# Patient Record
Sex: Female | Born: 1975
Health system: Southern US, Community
[De-identification: ages and names within clinical notes are randomized; demographics above are authoritative.]

## PROBLEM LIST (undated history)

## (undated) DIAGNOSIS — E7849 Other hyperlipidemia: Secondary | ICD-10-CM

## (undated) DIAGNOSIS — T7840XA Allergy, unspecified, initial encounter: Secondary | ICD-10-CM

## (undated) DIAGNOSIS — T783XXA Angioneurotic edema, initial encounter: Secondary | ICD-10-CM

## (undated) DIAGNOSIS — K449 Diaphragmatic hernia without obstruction or gangrene: Secondary | ICD-10-CM

## (undated) DIAGNOSIS — E876 Hypokalemia: Secondary | ICD-10-CM

## (undated) DIAGNOSIS — K219 Gastro-esophageal reflux disease without esophagitis: Secondary | ICD-10-CM

## (undated) DIAGNOSIS — F329 Major depressive disorder, single episode, unspecified: Secondary | ICD-10-CM

## (undated) DIAGNOSIS — F419 Anxiety disorder, unspecified: Secondary | ICD-10-CM

## (undated) DIAGNOSIS — I10 Essential (primary) hypertension: Secondary | ICD-10-CM

## (undated) DIAGNOSIS — E723 Disorders of lysine and hydroxylysine metabolism: Secondary | ICD-10-CM

## (undated) DIAGNOSIS — F32A Depression, unspecified: Secondary | ICD-10-CM

## (undated) DIAGNOSIS — D573 Sickle-cell trait: Secondary | ICD-10-CM

## (undated) HISTORY — DX: Allergy, unspecified, initial encounter: T78.40XA

## (undated) HISTORY — DX: Anxiety disorder, unspecified: F41.9

## (undated) HISTORY — PX: FOOT SURGERY: SHX648

## (undated) HISTORY — DX: Diaphragmatic hernia without obstruction or gangrene: K44.9

## (undated) HISTORY — DX: Essential (primary) hypertension: I10

## (undated) HISTORY — DX: Depression, unspecified: F32.A

## (undated) HISTORY — PX: UPPER GASTROINTESTINAL ENDOSCOPY: SHX188

## (undated) HISTORY — DX: Gastro-esophageal reflux disease without esophagitis: K21.9

## (undated) HISTORY — DX: Disorders of lysine and hydroxylysine metabolism: E72.3

## (undated) HISTORY — PX: TUBAL LIGATION: SHX77

## (undated) HISTORY — DX: Angioneurotic edema, initial encounter: T78.3XXA

## (undated) HISTORY — DX: Major depressive disorder, single episode, unspecified: F32.9

## (undated) HISTORY — DX: Other hyperlipidemia: E78.49

## (undated) HISTORY — DX: Sickle-cell trait: D57.3

---

## 1998-02-17 ENCOUNTER — Ambulatory Visit (HOSPITAL_COMMUNITY): Admission: RE | Admit: 1998-02-17 | Discharge: 1998-02-17 | Payer: Self-pay | Admitting: *Deleted

## 1998-02-27 ENCOUNTER — Inpatient Hospital Stay (HOSPITAL_COMMUNITY): Admission: AD | Admit: 1998-02-27 | Discharge: 1998-02-27 | Payer: Self-pay | Admitting: *Deleted

## 1998-04-05 ENCOUNTER — Inpatient Hospital Stay (HOSPITAL_COMMUNITY): Admission: AD | Admit: 1998-04-05 | Discharge: 1998-04-05 | Payer: Self-pay | Admitting: Obstetrics & Gynecology

## 1998-04-15 ENCOUNTER — Emergency Department (HOSPITAL_COMMUNITY): Admission: EM | Admit: 1998-04-15 | Discharge: 1998-04-15 | Payer: Self-pay | Admitting: Emergency Medicine

## 1998-04-15 ENCOUNTER — Observation Stay (HOSPITAL_COMMUNITY): Admission: AD | Admit: 1998-04-15 | Discharge: 1998-04-16 | Payer: Self-pay | Admitting: Obstetrics & Gynecology

## 1998-04-21 ENCOUNTER — Inpatient Hospital Stay (HOSPITAL_COMMUNITY): Admission: AD | Admit: 1998-04-21 | Discharge: 1998-04-21 | Payer: Self-pay | Admitting: Obstetrics & Gynecology

## 1998-04-29 ENCOUNTER — Ambulatory Visit (HOSPITAL_COMMUNITY): Admission: RE | Admit: 1998-04-29 | Discharge: 1998-04-29 | Payer: Self-pay | Admitting: Obstetrics

## 1998-06-15 ENCOUNTER — Inpatient Hospital Stay (HOSPITAL_COMMUNITY): Admission: AD | Admit: 1998-06-15 | Discharge: 1998-06-15 | Payer: Self-pay | Admitting: Obstetrics

## 1998-07-19 ENCOUNTER — Encounter (HOSPITAL_COMMUNITY): Admission: RE | Admit: 1998-07-19 | Discharge: 1998-07-21 | Payer: Self-pay | Admitting: *Deleted

## 1998-07-20 ENCOUNTER — Inpatient Hospital Stay (HOSPITAL_COMMUNITY): Admission: AD | Admit: 1998-07-20 | Discharge: 1998-07-22 | Payer: Self-pay | Admitting: Obstetrics

## 1999-08-30 ENCOUNTER — Emergency Department (HOSPITAL_COMMUNITY): Admission: EM | Admit: 1999-08-30 | Discharge: 1999-08-30 | Payer: Self-pay | Admitting: Emergency Medicine

## 1999-11-17 ENCOUNTER — Encounter (INDEPENDENT_AMBULATORY_CARE_PROVIDER_SITE_OTHER): Payer: Self-pay

## 1999-11-17 ENCOUNTER — Other Ambulatory Visit: Admission: RE | Admit: 1999-11-17 | Discharge: 1999-11-17 | Payer: Self-pay | Admitting: Obstetrics

## 2000-01-20 ENCOUNTER — Emergency Department (HOSPITAL_COMMUNITY): Admission: EM | Admit: 2000-01-20 | Discharge: 2000-01-20 | Payer: Self-pay

## 2000-10-07 ENCOUNTER — Emergency Department (HOSPITAL_COMMUNITY): Admission: EM | Admit: 2000-10-07 | Discharge: 2000-10-07 | Payer: Self-pay | Admitting: Emergency Medicine

## 2000-10-07 ENCOUNTER — Encounter: Payer: Self-pay | Admitting: Emergency Medicine

## 2001-06-07 ENCOUNTER — Emergency Department (HOSPITAL_COMMUNITY): Admission: EM | Admit: 2001-06-07 | Discharge: 2001-06-07 | Payer: Self-pay | Admitting: *Deleted

## 2002-06-16 ENCOUNTER — Emergency Department (HOSPITAL_COMMUNITY): Admission: EM | Admit: 2002-06-16 | Discharge: 2002-06-16 | Payer: Self-pay | Admitting: Emergency Medicine

## 2002-06-18 ENCOUNTER — Ambulatory Visit (HOSPITAL_BASED_OUTPATIENT_CLINIC_OR_DEPARTMENT_OTHER): Admission: RE | Admit: 2002-06-18 | Discharge: 2002-06-18 | Payer: Self-pay | Admitting: Orthopaedic Surgery

## 2002-08-26 ENCOUNTER — Emergency Department (HOSPITAL_COMMUNITY): Admission: EM | Admit: 2002-08-26 | Discharge: 2002-08-26 | Payer: Self-pay | Admitting: Emergency Medicine

## 2002-08-26 ENCOUNTER — Encounter: Payer: Self-pay | Admitting: Emergency Medicine

## 2004-11-16 ENCOUNTER — Ambulatory Visit: Payer: Self-pay | Admitting: Family Medicine

## 2004-12-18 ENCOUNTER — Ambulatory Visit: Payer: Self-pay | Admitting: Family Medicine

## 2004-12-18 ENCOUNTER — Ambulatory Visit (HOSPITAL_COMMUNITY): Admission: RE | Admit: 2004-12-18 | Discharge: 2004-12-18 | Payer: Self-pay | Admitting: Family Medicine

## 2005-01-18 ENCOUNTER — Ambulatory Visit: Payer: Self-pay | Admitting: Family Medicine

## 2005-08-22 ENCOUNTER — Emergency Department (HOSPITAL_COMMUNITY): Admission: EM | Admit: 2005-08-22 | Discharge: 2005-08-23 | Payer: Self-pay | Admitting: Emergency Medicine

## 2005-11-30 ENCOUNTER — Inpatient Hospital Stay (HOSPITAL_COMMUNITY): Admission: AD | Admit: 2005-11-30 | Discharge: 2005-11-30 | Payer: Self-pay | Admitting: *Deleted

## 2006-08-13 ENCOUNTER — Emergency Department (HOSPITAL_COMMUNITY): Admission: EM | Admit: 2006-08-13 | Discharge: 2006-08-13 | Payer: Self-pay | Admitting: Emergency Medicine

## 2006-09-01 ENCOUNTER — Inpatient Hospital Stay (HOSPITAL_COMMUNITY): Admission: AD | Admit: 2006-09-01 | Discharge: 2006-09-01 | Payer: Self-pay | Admitting: Gynecology

## 2008-01-28 ENCOUNTER — Emergency Department (HOSPITAL_COMMUNITY): Admission: EM | Admit: 2008-01-28 | Discharge: 2008-01-28 | Payer: Self-pay | Admitting: Emergency Medicine

## 2008-05-31 ENCOUNTER — Emergency Department (HOSPITAL_COMMUNITY): Admission: EM | Admit: 2008-05-31 | Discharge: 2008-05-31 | Payer: Self-pay | Admitting: Emergency Medicine

## 2008-08-30 ENCOUNTER — Emergency Department (HOSPITAL_COMMUNITY): Admission: EM | Admit: 2008-08-30 | Discharge: 2008-08-30 | Payer: Self-pay | Admitting: Emergency Medicine

## 2008-11-03 ENCOUNTER — Emergency Department (HOSPITAL_COMMUNITY): Admission: EM | Admit: 2008-11-03 | Discharge: 2008-11-03 | Payer: Self-pay | Admitting: Emergency Medicine

## 2009-02-08 ENCOUNTER — Emergency Department (HOSPITAL_COMMUNITY): Admission: EM | Admit: 2009-02-08 | Discharge: 2009-02-08 | Payer: Self-pay | Admitting: Family Medicine

## 2009-05-19 ENCOUNTER — Inpatient Hospital Stay (HOSPITAL_COMMUNITY): Admission: AD | Admit: 2009-05-19 | Discharge: 2009-05-19 | Payer: Self-pay | Admitting: Obstetrics & Gynecology

## 2009-05-19 ENCOUNTER — Ambulatory Visit: Payer: Self-pay | Admitting: Physician Assistant

## 2009-05-23 ENCOUNTER — Encounter: Admission: RE | Admit: 2009-05-23 | Discharge: 2009-05-23 | Payer: Self-pay | Admitting: Obstetrics & Gynecology

## 2009-06-07 ENCOUNTER — Emergency Department (HOSPITAL_COMMUNITY): Admission: EM | Admit: 2009-06-07 | Discharge: 2009-06-07 | Payer: Self-pay | Admitting: Emergency Medicine

## 2010-04-04 ENCOUNTER — Emergency Department (HOSPITAL_COMMUNITY): Admission: EM | Admit: 2010-04-04 | Discharge: 2010-04-04 | Payer: Self-pay | Admitting: Family Medicine

## 2010-05-31 ENCOUNTER — Emergency Department (HOSPITAL_COMMUNITY): Admission: EM | Admit: 2010-05-31 | Discharge: 2010-05-31 | Payer: Self-pay | Admitting: Family Medicine

## 2010-06-27 ENCOUNTER — Ambulatory Visit: Payer: Self-pay | Admitting: Obstetrics and Gynecology

## 2010-06-27 ENCOUNTER — Inpatient Hospital Stay (HOSPITAL_COMMUNITY): Admission: AD | Admit: 2010-06-27 | Discharge: 2010-06-27 | Payer: Self-pay | Admitting: Obstetrics and Gynecology

## 2011-01-17 LAB — HM MAMMOGRAPHY

## 2011-03-04 LAB — URINALYSIS, ROUTINE W REFLEX MICROSCOPIC
Hgb urine dipstick: NEGATIVE
Specific Gravity, Urine: 1.01 (ref 1.005–1.030)
Urobilinogen, UA: 0.2 mg/dL (ref 0.0–1.0)

## 2011-03-04 LAB — POCT URINALYSIS DIP (DEVICE)
Bilirubin Urine: NEGATIVE
Hgb urine dipstick: NEGATIVE
Nitrite: NEGATIVE
Urobilinogen, UA: 0.2 mg/dL (ref 0.0–1.0)
pH: 8 (ref 5.0–8.0)

## 2011-03-04 LAB — POCT PREGNANCY, URINE: Preg Test, Ur: NEGATIVE

## 2011-03-04 LAB — CBC
MCH: 28.3 pg (ref 26.0–34.0)
MCHC: 33.8 g/dL (ref 30.0–36.0)
Platelets: 267 10*3/uL (ref 150–400)
WBC: 6.8 10*3/uL (ref 4.0–10.5)

## 2011-03-04 LAB — WET PREP, GENITAL
Trich, Wet Prep: NONE SEEN
Yeast Wet Prep HPF POC: NONE SEEN

## 2011-03-04 LAB — GC/CHLAMYDIA PROBE AMP, GENITAL: GC Probe Amp, Genital: NEGATIVE

## 2011-03-06 LAB — POCT URINALYSIS DIP (DEVICE)
Bilirubin Urine: NEGATIVE
Glucose, UA: NEGATIVE mg/dL
Ketones, ur: NEGATIVE mg/dL
Nitrite: NEGATIVE

## 2011-03-26 LAB — BASIC METABOLIC PANEL
CO2: 26 mEq/L (ref 19–32)
Calcium: 9 mg/dL (ref 8.4–10.5)
Chloride: 105 mEq/L (ref 96–112)
Creatinine, Ser: 0.89 mg/dL (ref 0.4–1.2)
GFR calc Af Amer: >60 mL/min (ref >60)
Glucose, Bld: 89 mg/dL (ref 70–99)
Potassium: 3.4 mEq/L — ABNORMAL LOW (ref 3.5–5.1)
Sodium: 139 mEq/L (ref 135–145)

## 2011-03-26 LAB — DIFFERENTIAL
Basophils Absolute: 0 10*3/uL (ref 0.0–0.1)
Basophils Relative: 1 % (ref 0–1)
Eosinophils Absolute: 0.2 10*3/uL (ref 0.0–0.7)
Lymphs Abs: 2.8 10*3/uL (ref 0.7–4.0)
Monocytes Absolute: 0.6 10*3/uL (ref 0.1–1.0)
Monocytes Relative: 9 % (ref 3–12)

## 2011-03-26 LAB — URINALYSIS, ROUTINE W REFLEX MICROSCOPIC
Bilirubin Urine: NEGATIVE
Hgb urine dipstick: NEGATIVE
Nitrite: NEGATIVE
Protein, ur: NEGATIVE mg/dL
pH: 5.5 (ref 5.0–8.0)

## 2011-03-26 LAB — CK TOTAL AND CKMB (NOT AT ARMC)
CK, MB: 1.6 ng/mL (ref 0.3–4.0)
Relative Index: 0.7 (ref 0.0–2.5)
Total CK: 226 U/L — ABNORMAL HIGH (ref 7–177)

## 2011-03-26 LAB — CBC
Hemoglobin: 12.5 g/dL (ref 12.0–15.0)
MCV: 83 fL (ref 78.0–100.0)
RBC: 4.36 MIL/uL (ref 3.87–5.11)
RDW: 15.4 % (ref 11.5–15.5)

## 2011-04-05 ENCOUNTER — Inpatient Hospital Stay (INDEPENDENT_AMBULATORY_CARE_PROVIDER_SITE_OTHER)
Admission: RE | Admit: 2011-04-05 | Discharge: 2011-04-05 | Disposition: A | Discharge status: Home / Self Care | Payer: BLUE CROSS/BLUE SHIELD | Source: Ambulatory Visit | Attending: Family Medicine | Admitting: Family Medicine

## 2011-04-05 ENCOUNTER — Encounter: Payer: Self-pay | Admitting: Family Medicine

## 2011-04-05 DIAGNOSIS — R209 Unspecified disturbances of skin sensation: Secondary | ICD-10-CM

## 2011-04-05 DIAGNOSIS — M7520 Bicipital tendinitis, unspecified shoulder: Secondary | ICD-10-CM

## 2011-04-06 ENCOUNTER — Encounter: Payer: Self-pay | Admitting: Family Medicine

## 2011-04-07 ENCOUNTER — Telehealth (INDEPENDENT_AMBULATORY_CARE_PROVIDER_SITE_OTHER): Payer: Self-pay

## 2011-05-04 NOTE — Op Note (Signed)
[place]  Patient:    Mary Robles, Mary Robles Visit Number: [id number] MRN: [id number]          Service Type: DSU Location: [facility] Attending Physician:  Randolm Idol Dictated by:   Claude Manges. Cleophas Dunker, M.D. Proc. Date: 06/18/02 Admit Date:  06/18/2002 Discharge Date: 06/18/2002                             Operative Report  PREOPERATIVE DIAGNOSIS:  Laceration of extensor hallucis longus tendon, right foot.  POSTOPERATIVE DIAGNOSIS:  Laceration of extensor hallucis longus tendon, right foot.  OPERATION:  Primary repair.  SURGEON:  Claude Manges. Cleophas Dunker, M.D.  ANESTHESIA:  Ankle block and IV sedation.  COMPLICATIONS:  None.  INDICATIONS:  [age]-year-old black female dropped a knife on the dorsum of her right foot three nights ago with about 1/2 inch laceration.  She lacerated the extensor hallucis longus tendon.  The wound was irrigated and closed in the emergency room, and she was placed on Keflex and asked to follow up in the office.  She was seen yesterday with confirmation of inability to dorsiflex the great toe and now is to have primary repair of the tendon.  DESCRIPTION OF PROCEDURE:  With the patient comfortable on the operating table and under excellent ankle block and IV sedation, the right lower extremity was prepped with Hibiclens and alcohol.  The tourniquet had been applied about the calf.  Sterile draping was performed.  With the foot elevated, it was Esmarch exsanguinated with the proximal tourniquet at 325 mmHg.  The laceration was located in the mid dorsum of the foot transversely about 1/2 inch in length. The laceration was located in the mid dorsum of the foot transversely about 1/2 inch in length.  The old stitches were removed.  Those instruments were removed from the operating table.  Again, I cleaned the wound with Hibiclens. The wound was then elongated proximally and laterally and then distally and medially.  The  flaps were then undermined with the scissors.  Two small veins were Bovie coagulated.  The flaps were then undermined with the scissors.  Two small veins were Bovie coagulated.  Larger veins were left intact.  By blunt dissection I was able to retrieve the retracted end proximally.  Using a 3-0 Ethibond, Bunnell suture was inserted.  The distal end had also retracted.  When dorsiflexing the foot and toes, I was able to retrieve the tendon and using the same suture with 3-0 Ethibond the Bunnell suture was sutured to it as well.  The ends of the tendon were then approximated.  The sutures were tied and I then used supplemental 4-0 Vicryl circumferentially about the tendon.  I had an excellent repair.  The wound was irrigated.  The sheath was repaired with 4-0 Vicryl.  Skin was closed with 4-0 subcuticular Vicryl and then Steri-Strips over Benzoin.  The tourniquet was deflated with immediate capillary refill to the toes.  Sterile bulky dressing was applied followed by posterior and U-splints with the foot at neutral.  The patient tolerated the procedure without complications.  PLAN:  Nonweightbearing crutches.  Percocet for pain.  Office in one week. Dictated by:   Claude Manges. Cleophas Dunker, M.D. Attending Physician:  Randolm Idol DD:  06/18/02 TD:  06/22/02 Job: [id number]

## 2011-05-04 NOTE — Op Note (Signed)
NAME:  Mary Robles, Mary Robles                 ACCOUNT NO.:  [id number]   MEDICAL RECORD NO.:  [id number]          PATIENT TYPE:  AMB   LOCATION:  SDC                           FACILITY:  [facility]   PHYSICIAN:  Tanya S. Shawnie Pons, M.D.   DATE OF BIRTH:  [birth year]   DATE OF PROCEDURE:  12/18/2004  DATE OF DISCHARGE:                                 OPERATIVE REPORT   PREOPERATIVE DIAGNOSIS:  Multiparity, undesired fertility.   POSTOPERATIVE DIAGNOSIS:  Multiparity, undesired fertility.   PROCEDURE:  Laparoscopic bilateral tubal ligation with Filshie clip.   SURGEON:  Shelbie Proctor. Shawnie Pons, M.D.   ASSISTANT:  Lonell Grandchild.   ANESTHESIA:  General.   SPECIMENS:  None.   ESTIMATED BLOOD LOSS:  Less than 25 cc.   COMPLICATIONS:  None.   FINDINGS:  Normal appearance of tubes and ovaries.  She did have some pelvic  congestion noted as well as hyperemia noted.   REASON FOR PROCEDURE:  The patient is a 35 year old G4, P2-0-2-2 who desires  permanent sterilization.  She was counseled regarding the risks and benefits  of the procedure including failure rate which is 1 in 200 and increased risk  of ectopic should pregnancy occur.  The patient understood these risks and  agreed to proceed.   PROCEDURE:  The patient was taken to the OR.  She was placed in the dorsal  lithotomy in [place] stirrups.  She was prepped and draped in the usual  sterile fashion.  Her bladder was drained with a red rubber catheter.  The  speculum was used to visualize the cervix.  The fascia was grasped  anteriorly with a single-toothed tenaculum and a Hulka tenaculum placed  without difficulty.  The single-toothed tenaculum was then removed from the  vagina.  Attention was then turned to the abdomen.  A 2 cc of 0.25% Marcaine  were infiltrated in the umbilicus.  Two Allis clamps were used to elevate it  and the incision made until the peritoneal cavity entered sharply.  The two  edges of the fascia were tagged with a 0Vicryl suture on  the UR6.  The  Hasson trocar was introduced into the abdomen. Gas was turned on.  The  camera put in and the area below the incision was inspected and found to be  within normal limits.  Attention was then turned to the pelvis as the  patient was placed in Trendelenburg.  The tubes were visualized, grasped and  followed to their fimbriated end.  A Filshie clip was placed across the left  and right tube without difficulty.  Pictures were taken of the tubes in situ  with the clips in place.  All instruments were then removed from the abdomen  and gas allowed to escape through the incision.  The fascia at the umbilicus  was then closed in two figure-of-eights of the previous sutures in the UR6  until the fascia was felt closed.  The skin was closed with a 4-0  Vicryl subcutaneous stitch without difficulty.  Four more cc's of 0.25%  Marcaine were then infiltrated to the incision. All  instrument, needle and  lap counts were correct x2.  The Hulka tenaculum was taken out of the  vagina.  The patient was awakened and taken to the recovery room in stable  condition.      TSP/MEDQ  D:  12/18/2004  T:  12/18/2004  Job:  [id number]

## 2011-07-12 ENCOUNTER — Encounter: Payer: Self-pay | Admitting: Family Medicine

## 2011-07-12 DIAGNOSIS — I10 Essential (primary) hypertension: Secondary | ICD-10-CM | POA: Insufficient documentation

## 2011-07-12 DIAGNOSIS — E559 Vitamin D deficiency, unspecified: Secondary | ICD-10-CM | POA: Insufficient documentation

## 2011-09-07 LAB — URINALYSIS, ROUTINE W REFLEX MICROSCOPIC
Glucose, UA: NEGATIVE
Ketones, ur: 15 — AB
Nitrite: NEGATIVE
Protein, ur: NEGATIVE

## 2011-09-07 LAB — DIFFERENTIAL
Basophils Absolute: 0.1
Basophils Relative: 1
Eosinophils Absolute: 0.2
Eosinophils Relative: 2
Lymphocytes Relative: 37
Lymphs Abs: 3
Monocytes Absolute: 0.8
Monocytes Relative: 10
Neutro Abs: 4.1
Neutrophils Relative %: 51

## 2011-09-07 LAB — CBC
MCHC: 34
MCV: 83.7
Platelets: 250
WBC: 8.1

## 2011-09-07 LAB — I-STAT 8, (EC8 V) (CONVERTED LAB)
Acid-base deficit: 2
BUN: 4 — ABNORMAL LOW
Bicarbonate: 23.6
Chloride: 106
Glucose, Bld: 71
HCT: 44
Hemoglobin: 15
Operator id: 234501
Potassium: 3.8
Sodium: 138
TCO2: 25
pCO2, Ven: 44.1 — ABNORMAL LOW
pH, Ven: 7.338 — ABNORMAL HIGH

## 2011-09-07 LAB — POCT I-STAT CREATININE
Creatinine, Ser: 0.9
Operator id: 234501

## 2011-09-07 LAB — POCT CARDIAC MARKERS
Myoglobin, poc: 36.2
Operator id: 234501
Troponin i, poc: <0.05

## 2011-09-19 LAB — DIFFERENTIAL
Eosinophils Absolute: 0.1
Lymphocytes Relative: 31
Lymphs Abs: 2.9
Monocytes Relative: 12
Neutro Abs: 5.1
Neutrophils Relative %: 55

## 2011-09-19 LAB — POCT I-STAT, CHEM 8
BUN: 7
Chloride: 101
Creatinine, Ser: 1.1
Potassium: 3.6
Sodium: 139
TCO2: 27

## 2011-09-19 LAB — URINALYSIS, ROUTINE W REFLEX MICROSCOPIC
Bilirubin Urine: NEGATIVE
Nitrite: NEGATIVE
Specific Gravity, Urine: 1.016
Urobilinogen, UA: 0.2
pH: 8

## 2011-09-19 LAB — CBC
MCV: 86.3
Platelets: 276
RBC: 4.56
WBC: 9.4

## 2011-09-19 LAB — POCT CARDIAC MARKERS
CKMB, poc: <1 — ABNORMAL LOW
Myoglobin, poc: 38.1
Myoglobin, poc: 42.8
Troponin i, poc: <0.05

## 2011-09-19 LAB — D-DIMER, QUANTITATIVE: D-Dimer, Quant: <0.22

## 2011-09-19 LAB — POCT PREGNANCY, URINE: Preg Test, Ur: NEGATIVE

## 2011-11-19 NOTE — Progress Notes (Signed)
Summary: LEF ARM PAIN Room 5   Vital Signs:  Patient Profile:   35 Years Old Female CC:      Left arm pain, tingling after light exercise this morning Height:     66 inches Weight:      182 pounds O2 Sat:      100 % O2 treatment:    Room Air Temp:     98.6 degrees F oral Pulse rate:   73 / minute Pulse rhythm:   regular Resp:     12 per minute BP sitting:   152 / 93  (right arm) Cuff size:   regular  Vitals Entered By: Emilio Math (April 05, 2011 1:00 PM)                  Current Allergies: No known allergies History of Present Illness Chief Complaint: Left arm pain, tingling after light exercise this morning History of Present Illness:  Subjective:  Patient was exercising in place at work this morning for about 10 minutes.  Exercise consisted of marching in place, with exaggerated movements of the arms.  Afterwards, she developed a sensation of numbness and cold in her left arm that has persisted.  She feels that the strength in her left arm is decreased.  No chest pain or shortness of breath. Patient states that she does not normally perform this type of exercise.  REVIEW OF SYSTEMS Constitutional Symptoms      Denies fever, chills, night sweats, weight loss, weight gain, and fatigue.  Eyes       Denies change in vision, eye pain, eye discharge, glasses, contact lenses, and eye surgery. Ear/Nose/Throat/Mouth       Denies hearing loss/aids, change in hearing, ear pain, ear discharge, dizziness, frequent runny nose, frequent nose bleeds, sinus problems, sore throat, hoarseness, and tooth pain or bleeding.  Respiratory       Denies dry cough, productive cough, wheezing, shortness of breath, asthma, bronchitis, and emphysema/COPD.  Cardiovascular       Denies murmurs, chest pain, and tires easily with exhertion.    Gastrointestinal       Denies stomach pain, nausea/vomiting, diarrhea, constipation, blood in bowel movements, and indigestion. Genitourniary       Denies  painful urination, kidney stones, and loss of urinary control. Neurological       Complains of tingling.      Denies paralysis, seizures, and fainting/blackouts. Musculoskeletal       Complains of muscle pain and muscle weakness.      Denies joint pain, joint stiffness, decreased range of motion, redness, swelling, and gout.  Skin       Denies bruising, unusual mles/lumps or sores, and hair/skin or nail changes.  Psych       Denies mood changes, temper/anger issues, anxiety/stress, speech problems, depression, and sleep problems.  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  Extraocular movement is intact.  Conjunctivae are not inflamed.  Neck:  Supple.  No adenopathy is present.  No thyromegaly is present.  Mild tenderness over the left trapezius muscle Left shoulder:  Full range of motion.  Distinct tenderness over the insertion of  biceps tendon, worse with resisted flexion of the left elbow. Left arm:  No tenderness below shoulder.  All joints have full range of motion.  Distal sensation and pulses intact. Good grip Lungs:  Clear to auscultation.  Breath sounds are equal.  Heart:  Regular rate and rhythm without murmurs, rubs, or gallops.  Chest:  Mild tenderness over the left pectoralis muscle Abdomen:  Nontender without masses or hepatosplenomegaly.  Bowel sounds are present.  No CVA or flank tenderness.  Extremities:  No edema.  Pedal pulses are full and equal.  No calf tenderness  Skin:  No rash EKG:  Normal Assessment New Problems: BICEPS TENDINITIS, LEFT (ICD-726.12) DISTURBANCE OF SKIN SENSATION (ICD-782.0)  SUSPECT  ACUTE LEFT BICEPS TENDON INFLAMMATION.  Plan New Orders: EKG w/ Interpretation [93000] New Patient Level IV [99204] Planning Comments:   Begin applying ice pack several times daily to left biceps insertion.  Begin Ibuprofen 200mg , 4 tabs every 8 hours with food. Begin range of motion and stretching exercises for left biceps ([employer] information and instruction patient handout given)  Followup with [facility] if not improved in two weeks.  Return if develops chest pain, shortness of breath, etc.   The patient and/or caregiver has been counseled thoroughly with regard to medications prescribed including dosage, schedule, interactions, rationale for use, and possible side effects and they verbalize understanding.  Diagnoses and expected course of recovery discussed and will return if not improved as expected or if the condition worsens. Patient and/or caregiver verbalized understanding.   Orders Added: 1)  EKG w/ Interpretation [93000] 2)  New Patient Level IV [16109]

## 2011-11-19 NOTE — Letter (Signed)
Summary: Out of Work  [facility]  [address]   [place], [place] [address]   Phone: [phone]  Fax: [phone]    April 05, 2011   Employee:  [name]    To Whom It May Concern:   [name] was evaluated in our clinic today.   If you need additional information, please feel free to contact our office.         Sincerely,    Donna Christen MD

## 2011-11-19 NOTE — Telephone Encounter (Signed)
  Phone Note Outgoing Call   Call placed by: Linton Flemings RN,  April 07, 2011 2:08 PM Summary of Call: pt states she is still a little sore  instructed to follow up with ortho or return here with further problems/concerns Initial call taken by: Linton Flemings RN,  April 07, 2011 2:08 PM

## 2012-02-17 ENCOUNTER — Encounter (HOSPITAL_COMMUNITY): Payer: Self-pay

## 2012-02-17 ENCOUNTER — Emergency Department (INDEPENDENT_AMBULATORY_CARE_PROVIDER_SITE_OTHER)
Admission: EM | Admit: 2012-02-17 | Discharge: 2012-02-17 | Disposition: A | Discharge status: Home Health | Payer: BC Managed Care – PPO | Source: Home / Self Care | Attending: Family Medicine | Admitting: Family Medicine

## 2012-02-17 DIAGNOSIS — J329 Chronic sinusitis, unspecified: Secondary | ICD-10-CM

## 2012-02-17 MED ORDER — FLUCONAZOLE 150 MG PO TABS: 150.0000 mg | ORAL_TABLET | Freq: Once | ORAL | Status: AC

## 2012-02-17 MED ORDER — AMOXICILLIN 500 MG PO CAPS: 500.0000 mg | ORAL_CAPSULE | Freq: Three times a day (TID) | ORAL | Status: AC

## 2012-02-17 NOTE — Discharge Instructions (Signed)
Sinusitis Sinuses are air pockets within the bones of your face. The growth of bacteria within a sinus leads to infection. The infection prevents the sinuses from draining. This infection is called sinusitis. SYMPTOMS  There will be different areas of pain depending on which sinuses have become infected.  The maxillary sinuses often produce pain beneath the eyes.   Frontal sinusitis may cause pain in the middle of the forehead and above the eyes.  Other problems (symptoms) include:  Toothaches.   Colored, pus-like (purulent) drainage from the nose.   Swelling, warmth, and tenderness over the sinus areas may be signs of infection.  TREATMENT  Sinusitis is most often determined by an exam.X-rays may be taken. If x-rays have been taken, make sure you obtain your results or find out how you are to obtain them. Your caregiver may give you medications (antibiotics). These are medications that will help kill the bacteria causing the infection. You may also be given a medication (decongestant) that helps to reduce sinus swelling.  HOME CARE INSTRUCTIONS   Only take over-the-counter or prescription medicines for pain, discomfort, or fever as directed by your caregiver.   Drink extra fluids. Fluids help thin the mucus so your sinuses can drain more easily.   Applying either moist heat or ice packs to the sinus areas may help relieve discomfort.   Use saline nasal sprays to help moisten your sinuses. The sprays can be found at your local drugstore.  SEEK IMMEDIATE MEDICAL CARE IF:  You have a fever.   You have increasing pain, severe headaches, or toothache.   You have nausea, vomiting, or drowsiness.   You develop unusual swelling around the face or trouble seeing.  MAKE SURE YOU:   Understand these instructions.   Will watch your condition.   Will get help right away if you are not doing well or get worse.  Document Released: 12/03/2005 Document Revised: 11/22/2011 Document Reviewed:  07/02/2007 [facility] Patient Information 2012 [place], [place].B.R.A.T. Diet Your doctor has recommended the B.R.A.T. diet for you or your child until the condition improves. This is often used to help control diarrhea and vomiting symptoms. If you or your child can tolerate clear liquids, you may have:  Bananas.   Rice.   Applesauce.   Toast (and other simple starches such as crackers, potatoes, noodles).  Be sure to avoid dairy products, meats, and fatty foods until symptoms are better. Fruit juices such as apple, grape, and prune juice can make diarrhea worse. Avoid these. Continue this diet for 2 days or as instructed by your caregiver. Document Released: 12/03/2005 Document Revised: 11/22/2011 Document Reviewed: 05/22/2007 [facility] Patient Information 2012 [place], [place].

## 2012-02-17 NOTE — ED Provider Notes (Signed)
History     CSN: [id number]  Arrival date & time 02/17/12  1436   First MD Initiated Contact with Patient 02/17/12 1522      Chief Complaint  Patient presents with  . Nasal Congestion    congestion, cough, fever, ha    (Consider location/radiation/quality/duration/timing/severity/associated sxs/prior treatment) Patient is a 36 y.o. female presenting with cough. The history is provided by the patient. No language interpreter was used.  Cough This is a new problem. The current episode started more than 2 days ago. The problem occurs constantly. The problem has not changed since onset.The cough is non-productive. There has been no fever. The fever has been present for less than 1 day. Associated symptoms include sore throat and shortness of breath. She has tried nothing for the symptoms. The treatment provided moderate relief. She is not a smoker. Her past medical history does not include pneumonia or asthma.    Past Medical History  Diagnosis Date  . Herpes simplex   . Sickle cell trait   . Asthma   . Anxiety   . Hypertension   . Hyperlysinemia   . Vitamin d deficiency     Past Surgical History  Procedure Date  . Tubal ligation   . Foot surgery     No family history on file.  History  Substance Use Topics  . Smoking status: Not on file  . Smokeless tobacco: Not on file  . Alcohol Use:     OB History    Grav Para Term Preterm Abortions TAB SAB Ect Mult Living                  Review of Systems  HENT: Positive for sore throat.   Respiratory: Positive for cough and shortness of breath.   All other systems reviewed and are negative.    Allergies  Betadine  Home Medications   Current Outpatient Rx  Name Route Sig Dispense Refill  . ASPIRIN 81 MG PO TABS Oral Take 81 mg by mouth daily.    Marland Kitchen BENAZEPRIL HCL 20 MG PO TABS Oral Take 20 mg by mouth daily.      Marland Kitchen HYDROCHLOROTHIAZIDE 12.5 MG PO CAPS Oral Take 12.5 mg by mouth daily.        BP 135/85  Pulse 97   Temp(Src) 99.1 F (37.3 C) (Oral)  Resp 18  SpO2 97%  Physical Exam  Nursing note and vitals reviewed. Constitutional: She is oriented to person, place, and time. She appears well-developed and well-nourished.  HENT:  Head: Normocephalic and atraumatic.  Right Ear: External ear normal.  Left Ear: External ear normal.  Nose: Nose normal.  Mouth/Throat: Oropharynx is clear and moist.       Tender frontal and maxillary sinuses  Eyes: Conjunctivae and EOM are normal. Pupils are equal, round, and reactive to light.  Neck: Normal range of motion. Neck supple.  Cardiovascular: Normal rate.   Pulmonary/Chest: Effort normal.  Abdominal: Soft.  Musculoskeletal: Normal range of motion.  Neurological: She is alert and oriented to person, place, and time. She has normal reflexes.  Skin: Skin is warm.  Psychiatric: She has a normal mood and affect.    ED Course  Procedures (including critical care time)  Labs Reviewed - No data to display No results found.   No diagnosis found.    MDM  Pt started on amoxicillian.   I advised recheck with primary.  Return if any problems.         Clydie Braun  [place], [place] 02/17/12 1540

## 2012-02-17 NOTE — ED Notes (Signed)
Pt c/o congestion, cough, headache and fever which started on Thursday, denies n/v/d

## 2012-02-19 NOTE — ED Provider Notes (Signed)
Medical screening examination/treatment/procedure(s) were performed by resident physician or non-physician practitioner and as supervising physician I was immediately available for consultation/collaboration.   Barkley Bruns MD.    Barkley Bruns, MD 02/19/12 [phone]

## 2012-09-30 ENCOUNTER — Emergency Department (HOSPITAL_COMMUNITY)
Admission: EM | Admit: 2012-09-30 | Discharge: 2012-09-30 | Disposition: A | Discharge status: Home / Self Care | Payer: BC Managed Care – PPO | Attending: Emergency Medicine | Admitting: Emergency Medicine

## 2012-09-30 ENCOUNTER — Emergency Department (HOSPITAL_COMMUNITY): Payer: BC Managed Care – PPO

## 2012-09-30 ENCOUNTER — Encounter (HOSPITAL_COMMUNITY): Payer: Self-pay | Admitting: Emergency Medicine

## 2012-09-30 DIAGNOSIS — R209 Unspecified disturbances of skin sensation: Secondary | ICD-10-CM | POA: Insufficient documentation

## 2012-09-30 DIAGNOSIS — J45909 Unspecified asthma, uncomplicated: Secondary | ICD-10-CM | POA: Insufficient documentation

## 2012-09-30 DIAGNOSIS — R42 Dizziness and giddiness: Secondary | ICD-10-CM | POA: Insufficient documentation

## 2012-09-30 DIAGNOSIS — Z7982 Long term (current) use of aspirin: Secondary | ICD-10-CM | POA: Insufficient documentation

## 2012-09-30 LAB — URINALYSIS, ROUTINE W REFLEX MICROSCOPIC
Glucose, UA: NEGATIVE mg/dL
Ketones, ur: 15 mg/dL — AB
Protein, ur: 30 mg/dL — AB
Urobilinogen, UA: 0.2 mg/dL (ref 0.0–1.0)

## 2012-09-30 LAB — CBC WITH DIFFERENTIAL/PLATELET
Basophils Absolute: 0 10*3/uL (ref 0.0–0.1)
Eosinophils Absolute: 0.1 10*3/uL (ref 0.0–0.7)
Eosinophils Relative: 2 % (ref 0–5)
HCT: 37.6 % (ref 36.0–46.0)
Lymphocytes Relative: 54 % — ABNORMAL HIGH (ref 12–46)
Lymphs Abs: 3.8 10*3/uL (ref 0.7–4.0)
MCH: 27.7 pg (ref 26.0–34.0)
MCV: 77.8 fL — ABNORMAL LOW (ref 78.0–100.0)
Monocytes Absolute: 0.8 10*3/uL (ref 0.1–1.0)
Platelets: 284 10*3/uL (ref 150–400)
RDW: 14.4 % (ref 11.5–15.5)
WBC: 7.1 10*3/uL (ref 4.0–10.5)

## 2012-09-30 LAB — BASIC METABOLIC PANEL
CO2: 26 mEq/L (ref 19–32)
Calcium: 9.3 mg/dL (ref 8.4–10.5)
Creatinine, Ser: 0.88 mg/dL (ref 0.50–1.10)
GFR calc non Af Amer: 84 mL/min — ABNORMAL LOW (ref >90)
Glucose, Bld: 62 mg/dL — ABNORMAL LOW (ref 70–99)
Sodium: 139 mEq/L (ref 135–145)

## 2012-09-30 LAB — URINE MICROSCOPIC-ADD ON

## 2012-09-30 LAB — POCT I-STAT TROPONIN I: Troponin i, poc: 0.01 ng/mL (ref 0.00–0.08)

## 2012-09-30 MED ORDER — SODIUM CHLORIDE 0.9 % IV SOLN: INTRAVENOUS | Status: DC

## 2012-09-30 MED ORDER — SODIUM CHLORIDE 0.9 % IV BOLUS (SEPSIS)
1000.0000 mL | Freq: Once | INTRAVENOUS | Status: AC
  Administered 2012-09-30: 1000 mL via INTRAVENOUS

## 2012-09-30 NOTE — ED Notes (Signed)
Pt alert, NAD, calm, interactive, skin W&D, resps e/u, speaking in clear complete sentences. C/o HA, 3/10, (Denies: pain, sob, nausea, dizziness or other sx).

## 2012-09-30 NOTE — ED Notes (Signed)
Pt c/o 5/10 CP and tightness at central chest with tingling all over body. Onset of pain started Friday, 09/26/12 and has remained constant until today. Pt believes sx's to be related to TDAP shot she received three weeks ago or to Flu shot she received two Friday's ago. Pt states this feeling of cp is similar to previous pain from respiratory infections. Pt denies sob, no edema, no weakness/ distress. Ambulates independently, good O2 on ra.

## 2012-09-30 NOTE — ED Provider Notes (Signed)
History     CSN: [id number]  Arrival date & time 09/30/12  1450   First MD Initiated Contact with Patient 09/30/12 1743      Chief Complaint  Patient presents with  . Headache  . Dizziness  . Numbness    (Consider location/radiation/quality/duration/timing/severity/associated sxs/prior treatment) Patient is a 36 y.o. female presenting with headaches. The history is provided by the patient.  Headache    patient here with dizziness headache x3 days. Saw Dr. yesterday and blood pressure in the office was elevated and she was placed on new blood pressure medication. Denies any fever or chills. No cough noted. No urinary symptoms. Currently on her menstrual cycle. Symptoms have been persistent and are made better or worse nothing. No prior history of same. No severe headaches or blurred vision. No vomiting.  Past Medical History  Diagnosis Date  . Herpes simplex   . Sickle cell trait   . Asthma   . Anxiety   . Hypertension   . Hyperlysinemia   . Vitamin D deficiency     Past Surgical History  Procedure Date  . Tubal ligation   . Foot surgery     History reviewed. No pertinent family history.  History  Substance Use Topics  . Smoking status: Not on file  . Smokeless tobacco: Not on file  . Alcohol Use:     OB History    Grav Para Term Preterm Abortions TAB SAB Ect Mult Living                  Review of Systems  Neurological: Positive for headaches.  All other systems reviewed and are negative.    Allergies  Betadine  Home Medications   Current Outpatient Rx  Name Route Sig Dispense Refill  . ALBUTEROL SULFATE HFA 108 (90 BASE) MCG/ACT IN AERS Inhalation Inhale 2 puffs into the lungs every 6 (six) hours as needed. For cough or wheezing    . ASPIRIN 81 MG PO TABS Oral Take 81 mg by mouth daily.    Marland Kitchen BENAZEPRIL HCL 20 MG PO TABS Oral Take 20 mg by mouth daily.      Marland Kitchen CLONIDINE HCL 0.1 MG PO TABS Oral Take 0.1 mg by mouth 2 (two) times daily.    Marland Kitchen  PRAVASTATIN SODIUM 40 MG PO TABS Oral Take 40 mg by mouth daily.      BP 136/74  Pulse 94  Temp 98.3 F (36.8 C) (Oral)  Resp 18  SpO2 100%  Physical Exam  Nursing note and vitals reviewed. Constitutional: She is oriented to person, place, and time. She appears well-developed and well-nourished.  Non-toxic appearance. No distress.  HENT:  Head: Normocephalic and atraumatic.  Eyes: Conjunctivae normal, EOM and lids are normal. Pupils are equal, round, and reactive to light.  Neck: Normal range of motion. Neck supple. No tracheal deviation present. No mass present.  Cardiovascular: Normal rate, regular rhythm and normal heart sounds.  Exam reveals no gallop.   No murmur heard. Pulmonary/Chest: Effort normal and breath sounds normal. No stridor. No respiratory distress. She has no decreased breath sounds. She has no wheezes. She has no rhonchi. She has no rales.  Abdominal: Soft. Normal appearance and bowel sounds are normal. She exhibits no distension. There is no tenderness. There is no rebound and no CVA tenderness.  Musculoskeletal: Normal range of motion. She exhibits no edema and no tenderness.  Neurological: She is alert and oriented to person, place, and time. She has normal strength.  No cranial nerve deficit or sensory deficit. GCS eye subscore is 4. GCS verbal subscore is 5. GCS motor subscore is 6.  Skin: Skin is warm and dry. No abrasion and no rash noted.  Psychiatric: She has a normal mood and affect. Her speech is normal and behavior is normal.    ED Course  Procedures (including critical care time)  Labs Reviewed  CBC WITH DIFFERENTIAL - Abnormal; Notable for the following:    MCV 77.8 (*)     Neutrophils Relative 33 (*)     Lymphocytes Relative 54 (*)     All other components within normal limits  BASIC METABOLIC PANEL - Abnormal; Notable for the following:    Glucose, Bld 62 (*)     GFR calc non Af Amer 84 (*)     All other components within normal limits    URINALYSIS, ROUTINE W REFLEX MICROSCOPIC - Abnormal; Notable for the following:    Color, Urine RED (*)  BIOCHEMICALS MAY BE AFFECTED BY COLOR   APPearance CLOUDY (*)     Hgb urine dipstick LARGE (*)     Ketones, ur 15 (*)     Protein, ur 30 (*)     Leukocytes, UA SMALL (*)     All other components within normal limits  URINE MICROSCOPIC-ADD ON - Abnormal; Notable for the following:    Squamous Epithelial / LPF MANY (*)     All other components within normal limits   No results found.   No diagnosis found.    MDM   Date: 09/30/2012  Rate: 73  Rhythm: normal sinus rhythm  QRS Axis: normal  Intervals: normal  ST/T Wave abnormalities: normal  Conduction Disutrbances:none  Narrative Interpretation:   Old EKG Reviewed: unchanged   7:48 PM Patient without concern for subarachnoid hemorrhage or bleeding. No concern for TIA or stroke. Neurological exam stable. Suspect blood pressure medications may be the cause. Will be discharged home       Toy Baker, MD 09/30/12 1949

## 2012-09-30 NOTE — ED Notes (Signed)
Pt c/o dizziness, HA and numbness starting yesterday; pt sts started on new htn meds yesterday

## 2012-12-23 ENCOUNTER — Encounter (HOSPITAL_COMMUNITY): Payer: Self-pay

## 2012-12-23 ENCOUNTER — Emergency Department (INDEPENDENT_AMBULATORY_CARE_PROVIDER_SITE_OTHER)
Admission: EM | Admit: 2012-12-23 | Discharge: 2012-12-23 | Disposition: A | Discharge status: Home / Self Care | Payer: BC Managed Care – PPO | Source: Home / Self Care | Attending: Emergency Medicine | Admitting: Emergency Medicine

## 2012-12-23 ENCOUNTER — Other Ambulatory Visit (HOSPITAL_COMMUNITY)
Admission: RE | Admit: 2012-12-23 | Discharge: 2012-12-23 | Disposition: A | Discharge status: Home / Self Care | Payer: BC Managed Care – PPO | Source: Ambulatory Visit | Attending: Emergency Medicine | Admitting: Emergency Medicine

## 2012-12-23 DIAGNOSIS — R319 Hematuria, unspecified: Secondary | ICD-10-CM

## 2012-12-23 DIAGNOSIS — N760 Acute vaginitis: Secondary | ICD-10-CM | POA: Insufficient documentation

## 2012-12-23 DIAGNOSIS — Z113 Encounter for screening for infections with a predominantly sexual mode of transmission: Secondary | ICD-10-CM | POA: Insufficient documentation

## 2012-12-23 LAB — POCT URINALYSIS DIP (DEVICE)
Glucose, UA: NEGATIVE mg/dL
Ketones, ur: NEGATIVE mg/dL
Leukocytes, UA: NEGATIVE
Protein, ur: NEGATIVE mg/dL
Specific Gravity, Urine: 1.01 (ref 1.005–1.030)

## 2012-12-23 LAB — POCT PREGNANCY, URINE: Preg Test, Ur: NEGATIVE

## 2012-12-23 MED ORDER — CEPHALEXIN 500 MG PO CAPS: 500.0000 mg | ORAL_CAPSULE | Freq: Three times a day (TID) | ORAL | Status: DC

## 2012-12-23 NOTE — ED Notes (Signed)
C/o pain lower back, blood in UA, , irregular menses; NAD at present

## 2012-12-23 NOTE — ED Provider Notes (Signed)
Chief Complaint  Patient presents with  . 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 She denies any dysuria but has had some frequency and urgency. She denies any odor to the urine. Over the past to 3 days she's had pain in her bilateral lower back and also her lower abdomen as well. She's felt slightly nauseated but has not vomited. She denies any fever, chills, vaginal discharge, or itching. She was not sure where the blood was coming from the urethra or the vagina. Her last menstrual period was December 23. She is sexually active with one marital partner and has had a tubal ligation. She denies any pain with intercourse.  Review of Systems:  Other than noted above, the patient denies any of the following symptoms: General:  No fevers, chills, sweats, aches, or fatigue. GI:  No abdominal pain, back pain, nausea, vomiting, diarrhea, or constipation. GU:  No dysuria, frequency, urgency, hematuria, or incontinence. GYN:  No discharge, itching, vulvar pain or lesions, pelvic pain, or abnormal vaginal bleeding.  PMFSH:  Past medical history, family history, social history, meds, and allergies were reviewed.  Physical Exam:   Vital signs:  BP 169/100  Pulse 72  Temp 98.1 F (36.7 C) (Oral)  Resp 16  SpO2 100% Gen:  Alert, oriented, in no distress. Lungs:  Clear to auscultation, no wheezes, rales or rhonchi. Heart:  Regular rhythm, no gallop or murmer. Abdomen:  Flat and soft. There was slight suprapubic pain to palpation.  No guarding, or rebound.  No hepato-splenomegaly or mass.  Bowel sounds were normally active.  No hernia. Pelvic exam:   normal external genitalia, urethral orifice appears normal. Vaginal and cervical mucosa were normal. There was no vaginal bleeding or discharge. She had a few small cysts on her cervix. There was no pain on cervical motion. Uterus was  normal in size and shape and nontender. There was no adnexal tenderness or mass on the left and mild adnexal tenderness but no mass on the right. Back:  No CVA tenderness.  Skin:  Clear, warm and dry.  Labs:   Results for orders placed during the hospital encounter of 12/23/12  POCT URINALYSIS DIP (DEVICE)      Component Value Range   Glucose, UA NEGATIVE  NEGATIVE mg/dL   Bilirubin Urine NEGATIVE  NEGATIVE   Ketones, ur NEGATIVE  NEGATIVE mg/dL   Specific Gravity, Urine 1.010  1.005 - 1.030   Hgb urine dipstick TRACE (*) NEGATIVE   pH 7.0  5.0 - 8.0   Protein, ur NEGATIVE  NEGATIVE mg/dL   Urobilinogen, UA 0.2  0.0 - 1.0 mg/dL   Nitrite NEGATIVE  NEGATIVE   Leukocytes, UA NEGATIVE  NEGATIVE  POCT PREGNANCY, URINE      Component Value Range   Preg Test, Ur NEGATIVE  NEGATIVE     Other Labs Obtained at Urgent Care Center:  A urine culture was obtained. also obtained were DNA probes for gonorrhea, Chlamydia, Trichomonas, Gardnerella, Candida.  Results are pending at this time and we will call about any positive results.  Assessment: The encounter diagnosis was Hematuria.   Differential diagnosis includes infection, stone, or tumor. The patient was informed of these possibilities. I suggested we go ahead and start on cephalexin. If the hematuria had not improved by next week, I suggested she followup with a urologist. If the  hematuria doesn't clear up, I suggested she followup with her primary care physician for a repeat urinalysis in 2 weeks and then one again in a month.  Plan:   1.  The following meds were prescribed:   New Prescriptions   CEPHALEXIN (KEFLEX) 500 MG CAPSULE    Take 1 capsule (500 mg total) by mouth 3 (three) times daily.   2.  The patient was instructed in symptomatic care and handouts were given. 3.  The patient was told to return if becoming worse in any way, if no better in 3 or 4 days, and given some red flag symptoms that would indicate earlier return. 4.   The patient was told to avoid intercourse for 10 days, get extra fluids, and return for a follow up with her primary care doctor at the completion of treatment for a repeat UA and culture.     Reuben Likes, MD 12/23/12 [phone]

## 2012-12-24 LAB — URINE CULTURE

## 2012-12-25 NOTE — ED Notes (Signed)
GC/Chlamydia neg., Affirm: Gardnerella pos., Candida and Trich neg., Urine culture: No growth. Message sent to Dr. Lorenz Coaster. Mary Robles 12/25/2012

## 2012-12-26 ENCOUNTER — Telehealth (HOSPITAL_COMMUNITY): Payer: Self-pay | Admitting: Emergency Medicine

## 2012-12-26 MED ORDER — METRONIDAZOLE 500 MG PO TABS: 500.0000 mg | ORAL_TABLET | Freq: Two times a day (BID) | ORAL | Status: DC

## 2012-12-26 NOTE — ED Notes (Signed)
Patient called wanting to know what new RX was for.  I explained to patient DNA probe showed Gardnerella which is a bacterial infection of the vaginal.  Patient was explained the common causes.  Patient did not have any additional questions or concerns.  Patient was made aware to continue using Keflex in addition to Flagyl.  Patient also made aware not to drink alcohol with Flagyl.  Patient expressed understanding and did not have any additional questions or concerns

## 2012-12-26 NOTE — ED Notes (Signed)
The patient's DNA probe was positive for Gardnerella. We'll call in a prescription for Flagyl 500 mg twice a day for a week for her.  Reuben Likes, MD 12/26/12 [phone]

## 2012-12-26 NOTE — Telephone Encounter (Signed)
Message copied by Reuben Likes on Fri Dec 26, 2012  1:15 PM ------      Message from: Vassie Moselle      Created: Thu Dec 25, 2012 10:45 PM       Gardnerella pos. Rest of labs neg.  Needs tx.      Vassie Moselle      12/25/2012

## 2013-01-02 ENCOUNTER — Telehealth (HOSPITAL_COMMUNITY): Payer: Self-pay | Admitting: *Deleted

## 2013-01-02 NOTE — ED Notes (Signed)
I called pt. and verified x 2.  She said the pharmacy called her last week and she started the medication last Fri.  She has completed the Rx. and feels better. Vassie Moselle 01/02/2013

## 2013-01-14 ENCOUNTER — Encounter (HOSPITAL_COMMUNITY): Payer: Self-pay | Admitting: Emergency Medicine

## 2013-01-14 ENCOUNTER — Emergency Department (HOSPITAL_COMMUNITY)
Admission: EM | Admit: 2013-01-14 | Discharge: 2013-01-15 | Disposition: A | Discharge status: Home / Self Care | Payer: BC Managed Care – PPO | Attending: Emergency Medicine | Admitting: Emergency Medicine

## 2013-01-14 DIAGNOSIS — Z862 Personal history of diseases of the blood and blood-forming organs and certain disorders involving the immune mechanism: Secondary | ICD-10-CM | POA: Insufficient documentation

## 2013-01-14 DIAGNOSIS — R109 Unspecified abdominal pain: Secondary | ICD-10-CM

## 2013-01-14 DIAGNOSIS — Z8639 Personal history of other endocrine, nutritional and metabolic disease: Secondary | ICD-10-CM | POA: Insufficient documentation

## 2013-01-14 DIAGNOSIS — Z9851 Tubal ligation status: Secondary | ICD-10-CM | POA: Insufficient documentation

## 2013-01-14 DIAGNOSIS — Z3202 Encounter for pregnancy test, result negative: Secondary | ICD-10-CM | POA: Insufficient documentation

## 2013-01-14 DIAGNOSIS — Z79899 Other long term (current) drug therapy: Secondary | ICD-10-CM | POA: Insufficient documentation

## 2013-01-14 DIAGNOSIS — N898 Other specified noninflammatory disorders of vagina: Secondary | ICD-10-CM | POA: Insufficient documentation

## 2013-01-14 DIAGNOSIS — Z8619 Personal history of other infectious and parasitic diseases: Secondary | ICD-10-CM | POA: Insufficient documentation

## 2013-01-14 DIAGNOSIS — I10 Essential (primary) hypertension: Secondary | ICD-10-CM | POA: Insufficient documentation

## 2013-01-14 DIAGNOSIS — J45909 Unspecified asthma, uncomplicated: Secondary | ICD-10-CM | POA: Insufficient documentation

## 2013-01-14 LAB — COMPREHENSIVE METABOLIC PANEL
AST: 22 U/L (ref 0–37)
BUN: 11 mg/dL (ref 6–23)
CO2: 21 mEq/L (ref 19–32)
Chloride: 102 mEq/L (ref 96–112)
Creatinine, Ser: 0.78 mg/dL (ref 0.50–1.10)
GFR calc Af Amer: >90 mL/min (ref >90)
GFR calc non Af Amer: >90 mL/min (ref >90)
Glucose, Bld: 84 mg/dL (ref 70–99)
Total Bilirubin: 0.5 mg/dL (ref 0.3–1.2)

## 2013-01-14 LAB — CBC WITH DIFFERENTIAL/PLATELET
HCT: 42.5 % (ref 36.0–46.0)
Hemoglobin: 15.6 g/dL — ABNORMAL HIGH (ref 12.0–15.0)
Lymphocytes Relative: 10 % — ABNORMAL LOW (ref 12–46)
Lymphs Abs: 0.7 10*3/uL (ref 0.7–4.0)
MCV: 78.6 fL (ref 78.0–100.0)
Monocytes Absolute: 0.5 10*3/uL (ref 0.1–1.0)
Monocytes Relative: 7 % (ref 3–12)
Neutro Abs: 5.9 10*3/uL (ref 1.7–7.7)
WBC: 7.1 10*3/uL (ref 4.0–10.5)

## 2013-01-14 LAB — URINALYSIS, ROUTINE W REFLEX MICROSCOPIC
Glucose, UA: NEGATIVE mg/dL
Ketones, ur: 40 mg/dL — AB
pH: 6 (ref 5.0–8.0)

## 2013-01-14 LAB — URINE MICROSCOPIC-ADD ON

## 2013-01-14 LAB — POCT PREGNANCY, URINE: Preg Test, Ur: NEGATIVE

## 2013-01-14 MED ORDER — OXYCODONE-ACETAMINOPHEN 5-325 MG PO TABS
1.0000 | ORAL_TABLET | Freq: Once | ORAL | Status: DC
  Filled 2013-01-14: qty 1

## 2013-01-14 MED ORDER — ONDANSETRON 4 MG PO TBDP
8.0000 mg | ORAL_TABLET | Freq: Once | ORAL | Status: AC
  Administered 2013-01-14: 8 mg via ORAL
  Filled 2013-01-14: qty 2

## 2013-01-14 NOTE — ED Provider Notes (Signed)
History     CSN: [id number]  Arrival date & time 01/14/13  1556   First MD Initiated Contact with Patient 01/14/13 2322      Chief Complaint  Patient presents with  . Abdominal Pain    (Consider location/radiation/quality/duration/timing/severity/associated sxs/prior treatment) HPI History provided by pt.   Pt woke w/ pain across her lower abd w/ radiation to back this morning.  Describes as constant, achy and severe.  Associated w/ N/V and vaginal bleeding.  Denies fever, diarrhea and other GU sx.  For the past 4 years, she has not gotten her period in the month of January, so bleeding now is abnormal for her.  It has been approx 1 month since her last period.  H/o dysmenorrhea but this pain feels different.  Past abd surgeries include tubal ligation.  Past Medical History  Diagnosis Date  . Herpes simplex   . Sickle cell trait   . Asthma   . Anxiety   . Hypertension   . Hyperlysinemia   . Vitamin D deficiency     Past Surgical History  Procedure Date  . Tubal ligation   . Foot surgery     No family history on file.  History  Substance Use Topics  . Smoking status: Never Smoker   . Smokeless tobacco: Not on file  . Alcohol Use: No    OB History    Grav Para Term Preterm Abortions TAB SAB Ect Mult Living                  Review of Systems  All other systems reviewed and are negative.    Allergies  Betadine  Home Medications   Current Outpatient Rx  Name  Route  Sig  Dispense  Refill  . BENAZEPRIL HCL 20 MG PO TABS   Oral   Take 20 mg by mouth daily.           Marland Kitchen CLONAZEPAM 0.5 MG PO TABS   Oral   Take 1 mg by mouth at bedtime as needed. For sleep         . CLONIDINE HCL 0.1 MG PO TABS   Oral   Take 0.05 mg by mouth 3 (three) times daily.          . IBUPROFEN 200 MG PO TABS   Oral   Take 600 mg by mouth every 6 (six) hours as needed. For pain         . PRAVASTATIN SODIUM 40 MG PO TABS   Oral   Take 40 mg by mouth daily.            BP 131/93  Pulse 93  Temp 98.8 F (37.1 C) (Oral)  Resp 16  SpO2 100%  LMP 01/14/2013  Physical Exam  Nursing note and vitals reviewed. Constitutional: She is oriented to person, place, and time. She appears well-developed and well-nourished. No distress.       Pt does not appear uncomfortable  HENT:  Head: Normocephalic and atraumatic.  Eyes:       Normal appearance  Neck: Normal range of motion.  Cardiovascular: Normal rate and regular rhythm.   Pulmonary/Chest: Effort normal and breath sounds normal. No respiratory distress.  Abdominal: Soft. Bowel sounds are normal. She exhibits no distension and no mass. There is no rebound and no guarding.       Mild-mod tenderness across lower abd   Genitourinary:       No CVA tenderness.  Nml external genitalia.  Vaginal bleeding.  Cervix closed and appears nml.  Diffuse, mild tenderness on bimanual exam but no true adnexal or cervical motion tenderess.    Musculoskeletal: Normal range of motion.  Neurological: She is alert and oriented to person, place, and time.  Skin: Skin is warm and dry. No rash noted.  Psychiatric: She has a normal mood and affect. Her behavior is normal.    ED Course  Procedures (including critical care time)  Labs Reviewed  URINALYSIS, ROUTINE W REFLEX MICROSCOPIC - Abnormal; Notable for the following:    Color, Urine AMBER (*)  BIOCHEMICALS MAY BE AFFECTED BY COLOR   APPearance HAZY (*)     Specific Gravity, Urine 1.031 (*)     Hgb urine dipstick LARGE (*)     Bilirubin Urine SMALL (*)     Ketones, ur 40 (*)     Protein, ur 30 (*)     Leukocytes, UA SMALL (*)     All other components within normal limits  CBC WITH DIFFERENTIAL - Abnormal; Notable for the following:    RBC 5.41 (*)     Hemoglobin 15.6 (*)     MCHC 36.7 (*)     Neutrophils Relative 82 (*)     Lymphocytes Relative 10 (*)     All other components within normal limits  URINE MICROSCOPIC-ADD ON - Abnormal; Notable for the following:     Bacteria, UA FEW (*)     All other components within normal limits  COMPREHENSIVE METABOLIC PANEL  POCT PREGNANCY, URINE  URINE CULTURE  WET PREP, GENITAL  GC/CHLAMYDIA PROBE AMP   No results found.   1. Abdominal pain       MDM  36yo F presents w/ diffuse lower abd pain + vaginal bleeding.  LMP 1 month ago but was not expecting her period because has not had it in the month of January for the past 3 years.  Pain feels different than her typical period cramps.  On exam, afebrile, well-appearing, diffuse, mild lower abd tenderness and diffuse, mild tenderness on bimanual exam.  Labs unremarkable w/ exception of WBCs on wet prep.  Will treat empirically w/ zithromax and rocephin.  Low suspicion for TOA or torsion based on description of pain and my exam today.  Likely experiencing menstrual cramps.  Advised to f/u with her gynecologist.  Return precautions discussed.           Otilio Miu, PA-C 01/15/13 [phone]

## 2013-01-14 NOTE — ED Notes (Signed)
Pt c/o lower abd pain and upper abd burning onset earlier today.  Also c/o nausea, vomiting and diarrhea.

## 2013-01-15 LAB — WET PREP, GENITAL: Trich, Wet Prep: NONE SEEN

## 2013-01-15 MED ORDER — CEFTRIAXONE SODIUM 250 MG IJ SOLR
250.0000 mg | Freq: Once | INTRAMUSCULAR | Status: AC
  Administered 2013-01-15: 250 mg via INTRAMUSCULAR
  Filled 2013-01-15: qty 250

## 2013-01-15 MED ORDER — ONDANSETRON HCL 8 MG PO TABS: 8.0000 mg | ORAL_TABLET | Freq: Three times a day (TID) | ORAL | Status: DC | PRN

## 2013-01-15 MED ORDER — AZITHROMYCIN 250 MG PO TABS
1000.0000 mg | ORAL_TABLET | Freq: Once | ORAL | Status: AC
  Administered 2013-01-15: 1000 mg via ORAL
  Filled 2013-01-15: qty 4

## 2013-01-15 MED ORDER — HYDROCODONE-ACETAMINOPHEN 5-325 MG PO TABS
1.0000 | ORAL_TABLET | Freq: Once | ORAL | Status: AC
  Administered 2013-01-15: 1 via ORAL
  Filled 2013-01-15: qty 1

## 2013-01-15 MED ORDER — HYDROCODONE-ACETAMINOPHEN 5-325 MG PO TABS: 1.0000 | ORAL_TABLET | ORAL | Status: DC | PRN

## 2013-01-15 NOTE — ED Notes (Signed)
ED PA in room with pt 

## 2013-01-15 NOTE — ED Provider Notes (Signed)
Medical screening examination/treatment/procedure(s) were performed by non-physician practitioner and as supervising physician I was immediately available for consultation/collaboration.  Carling Liberman M Damani Rando, MD 01/15/13 0625 

## 2013-01-17 LAB — GC/CHLAMYDIA PROBE AMP: GC Probe RNA: NEGATIVE

## 2013-01-17 LAB — URINE CULTURE: Colony Count: >=100000

## 2013-01-18 NOTE — ED Notes (Signed)
+   Urine Chart sent to EDP office for review. 

## 2013-01-20 NOTE — ED Notes (Signed)
Chart returned from EDP office . Recommend Macrobid 100 mg PO BID x 5 days Per Fayrene Helper.

## 2013-01-21 NOTE — ED Notes (Signed)
rx called to pharmacy by [facility].

## 2013-01-28 ENCOUNTER — Other Ambulatory Visit: Payer: Self-pay | Admitting: Family Medicine

## 2013-01-28 ENCOUNTER — Ambulatory Visit
Admission: RE | Admit: 2013-01-28 | Discharge: 2013-01-28 | Disposition: A | Discharge status: Home / Self Care | Payer: BC Managed Care – PPO | Source: Ambulatory Visit | Attending: Family Medicine | Admitting: Family Medicine

## 2013-01-28 DIAGNOSIS — R109 Unspecified abdominal pain: Secondary | ICD-10-CM

## 2013-04-09 ENCOUNTER — Encounter: Payer: Self-pay | Admitting: Family Medicine

## 2013-04-09 ENCOUNTER — Ambulatory Visit (INDEPENDENT_AMBULATORY_CARE_PROVIDER_SITE_OTHER): Payer: BC Managed Care – PPO | Admitting: Family Medicine

## 2013-04-09 VITALS — BP 130/74 | HR 76 | Temp 98.5°F | Resp 14 | Wt 174.0 lb

## 2013-04-09 DIAGNOSIS — R202 Paresthesia of skin: Secondary | ICD-10-CM

## 2013-04-09 DIAGNOSIS — F410 Panic disorder [episodic paroxysmal anxiety] without agoraphobia: Secondary | ICD-10-CM

## 2013-04-09 DIAGNOSIS — R209 Unspecified disturbances of skin sensation: Secondary | ICD-10-CM

## 2013-04-09 LAB — HEPATIC FUNCTION PANEL
Albumin: 3.9 g/dL (ref 3.5–5.2)
Indirect Bilirubin: 0.5 mg/dL (ref 0.0–0.9)
Total Bilirubin: 0.7 mg/dL (ref 0.3–1.2)
Total Protein: 7 g/dL (ref 6.0–8.3)

## 2013-04-09 LAB — CBC WITH DIFFERENTIAL/PLATELET
Basophils Relative: 0 % (ref 0–1)
Hemoglobin: 13.1 g/dL (ref 12.0–15.0)
Lymphocytes Relative: 41 % (ref 12–46)
MCHC: 34.1 g/dL (ref 30.0–36.0)
Monocytes Relative: 9 % (ref 3–12)
Neutro Abs: 3.4 10*3/uL (ref 1.7–7.7)
Neutrophils Relative %: 49 % (ref 43–77)
RBC: 4.7 MIL/uL (ref 3.87–5.11)
WBC: 7 10*3/uL (ref 4.0–10.5)

## 2013-04-09 LAB — BASIC METABOLIC PANEL
CO2: 25 mEq/L (ref 19–32)
Calcium: 9.1 mg/dL (ref 8.4–10.5)
Creat: 0.83 mg/dL (ref 0.50–1.10)
Sodium: 139 mEq/L (ref 135–145)

## 2013-04-09 LAB — VITAMIN B12: Vitamin B-12: 327 pg/mL (ref 211–911)

## 2013-04-09 LAB — TSH: TSH: 1.549 u[IU]/mL (ref 0.350–4.500)

## 2013-04-09 NOTE — Progress Notes (Signed)
Subjective:    Patient ID: Mary Robles, female    DOB: Nov 15, 1976, 37 y.o.   MRN: [id number]  HPI  Patient presents today for followup. Should he go to an urgent care on Monday due to a low-grade fever, slightly elevated white blood cell count, rapid heart beat, and tingling in all of her body including her hands feet and her torso. She was diagnosed with a panic attack and likely viral syndrome. She was given when necessary Xanax which has helped. She has not had to take another Xanax since Monday. However the tingling persisted till Thursday. She is back at work. She is asymptomatic at present other than some mild fatigue. Past Medical History  Diagnosis Date  . Herpes simplex   . Sickle cell trait   . Asthma   . Anxiety   . Hypertension   . Hyperlysinemia   . Vitamin D deficiency    Current Outpatient Prescriptions on File Prior to Visit  Medication Sig Dispense Refill  . benazepril (LOTENSIN) 20 MG tablet Take 20 mg by mouth daily.        . cloNIDine (CATAPRES) 0.1 MG tablet Take 0.05 mg by mouth 3 (three) times daily.       . pravastatin (PRAVACHOL) 40 MG tablet Take 40 mg by mouth daily.       No current facility-administered medications on file prior to visit.   History   Social History  . Marital Status: Married    Spouse Name: N/A    Number of Children: N/A  . Years of Education: N/A   Occupational History  . Not on file.   Social History Main Topics  . Smoking status: Never Smoker   . Smokeless tobacco: Not on file  . Alcohol Use: No  . Drug Use: No  . Sexually Active:    Other Topics Concern  . Not on file   Social History Narrative  . No narrative on file     Review of Systems  All other systems reviewed and are negative.       Objective:   Physical Exam  Constitutional: She is oriented to person, place, and time. She appears well-developed and well-nourished.  HENT:  Head: Normocephalic and atraumatic.  Eyes: Conjunctivae are normal.   Neck: Neck supple. No thyromegaly present.  Cardiovascular: Normal rate, regular rhythm, normal heart sounds and intact distal pulses.  Exam reveals no gallop and no friction rub.   No murmur heard. Pulmonary/Chest: Effort normal and breath sounds normal. No respiratory distress. She has no wheezes. She has no rales.  Abdominal: Soft. Bowel sounds are normal.  Lymphadenopathy:    She has no cervical adenopathy.  Neurological: She is alert and oriented to person, place, and time. She has normal reflexes. She displays normal reflexes. No cranial nerve deficit. She exhibits normal muscle tone. Coordination normal.  Skin: Skin is warm. No rash noted.          Assessment & Plan:  1. Paresthesia Patient may have had paresthesias due to hyperventilation with her panic attack. However given her medications, I will check some baseline laboratory that is rule out electrolyte disturbances or vitamin deficiencies. Into conduction studies and EMGs if symptoms persist. - Basic Metabolic Panel - CBC with Differential - TSH - Vitamin B12 - Hepatic Function Panel  2. Panic attack This is the first and only panic attack patient had in quite some time. The time being we will treat this symptomatically with Xanax when necessary. If the  frequency of panic attacks increase, we may consider a daily preventative medication. She is 30 Xanax at home from the urgent care. She'll call me if she needs a refill on the medications

## 2013-05-26 ENCOUNTER — Telehealth: Payer: Self-pay | Admitting: Family Medicine

## 2013-05-27 MED ORDER — PRAVASTATIN SODIUM 40 MG PO TABS: 40.0000 mg | ORAL_TABLET | Freq: Every day | ORAL | Status: DC

## 2013-05-27 NOTE — Telephone Encounter (Signed)
Rx Refilled  

## 2013-06-12 ENCOUNTER — Telehealth: Payer: Self-pay | Admitting: Family Medicine

## 2013-06-12 MED ORDER — BENAZEPRIL HCL 20 MG PO TABS: 20.0000 mg | ORAL_TABLET | Freq: Every day | ORAL | Status: DC

## 2013-06-12 NOTE — Telephone Encounter (Signed)
Rx Refilled  

## 2013-07-01 ENCOUNTER — Emergency Department (HOSPITAL_COMMUNITY): Payer: BC Managed Care – PPO

## 2013-07-01 ENCOUNTER — Emergency Department (HOSPITAL_COMMUNITY)
Admission: EM | Admit: 2013-07-01 | Discharge: 2013-07-01 | Disposition: A | Discharge status: Home / Self Care | Payer: BC Managed Care – PPO | Attending: Emergency Medicine | Admitting: Emergency Medicine

## 2013-07-01 ENCOUNTER — Encounter (HOSPITAL_COMMUNITY): Payer: Self-pay | Admitting: Emergency Medicine

## 2013-07-01 DIAGNOSIS — Z8639 Personal history of other endocrine, nutritional and metabolic disease: Secondary | ICD-10-CM | POA: Insufficient documentation

## 2013-07-01 DIAGNOSIS — Z8659 Personal history of other mental and behavioral disorders: Secondary | ICD-10-CM | POA: Insufficient documentation

## 2013-07-01 DIAGNOSIS — Z8619 Personal history of other infectious and parasitic diseases: Secondary | ICD-10-CM | POA: Insufficient documentation

## 2013-07-01 DIAGNOSIS — Z862 Personal history of diseases of the blood and blood-forming organs and certain disorders involving the immune mechanism: Secondary | ICD-10-CM | POA: Insufficient documentation

## 2013-07-01 DIAGNOSIS — R209 Unspecified disturbances of skin sensation: Secondary | ICD-10-CM | POA: Insufficient documentation

## 2013-07-01 DIAGNOSIS — R200 Anesthesia of skin: Secondary | ICD-10-CM

## 2013-07-01 DIAGNOSIS — J45909 Unspecified asthma, uncomplicated: Secondary | ICD-10-CM | POA: Insufficient documentation

## 2013-07-01 DIAGNOSIS — I10 Essential (primary) hypertension: Secondary | ICD-10-CM | POA: Insufficient documentation

## 2013-07-01 DIAGNOSIS — Z79899 Other long term (current) drug therapy: Secondary | ICD-10-CM | POA: Insufficient documentation

## 2013-07-01 DIAGNOSIS — E7289 Other specified disorders of amino-acid metabolism: Secondary | ICD-10-CM | POA: Insufficient documentation

## 2013-07-01 LAB — COMPREHENSIVE METABOLIC PANEL
ALT: 21 U/L (ref 0–35)
Alkaline Phosphatase: 58 U/L (ref 39–117)
CO2: 24 mEq/L (ref 19–32)
GFR calc Af Amer: >90 mL/min (ref >90)
GFR calc non Af Amer: >90 mL/min (ref >90)
Glucose, Bld: 80 mg/dL (ref 70–99)
Potassium: 4 mEq/L (ref 3.5–5.1)
Sodium: 140 mEq/L (ref 135–145)
Total Bilirubin: 0.5 mg/dL (ref 0.3–1.2)

## 2013-07-01 LAB — URINALYSIS, ROUTINE W REFLEX MICROSCOPIC
Bilirubin Urine: NEGATIVE
Glucose, UA: NEGATIVE mg/dL
Hgb urine dipstick: NEGATIVE
Ketones, ur: NEGATIVE mg/dL
Protein, ur: NEGATIVE mg/dL

## 2013-07-01 LAB — CBC
Hemoglobin: 14.4 g/dL (ref 12.0–15.0)
RBC: 5.1 MIL/uL (ref 3.87–5.11)

## 2013-07-01 LAB — POCT I-STAT TROPONIN I: Troponin i, poc: 0 ng/mL (ref 0.00–0.08)

## 2013-07-01 MED ORDER — SODIUM CHLORIDE 0.9 % IV SOLN
1000.0000 mL | Freq: Once | INTRAVENOUS | Status: AC
  Administered 2013-07-01: 1000 mL via INTRAVENOUS

## 2013-07-01 MED ORDER — ASPIRIN 81 MG PO CHEW
324.0000 mg | CHEWABLE_TABLET | Freq: Once | ORAL | Status: AC
  Administered 2013-07-01: 324 mg via ORAL
  Filled 2013-07-01: qty 4

## 2013-07-01 MED ORDER — SODIUM CHLORIDE 0.9 % IV SOLN: 1000.0000 mL | INTRAVENOUS | Status: DC

## 2013-07-01 NOTE — ED Notes (Addendum)
Pt c/o numbness all over her body, sensation intact bilaterally. Pt states she was lightheaded and diaphoretic at work yesterday but this was not associated with the CP. Pt states pain in central chest and radiates to left lower lateral ribs. Pt states she was SOB last night. She also had cramps in hands and feet yesterday. Pt states she has had numbness in the past with panic attacks but denies having a panic attack yesterday.

## 2013-07-01 NOTE — ED Provider Notes (Signed)
History    CSN: [id number] Arrival date & time 07/01/13  1049  First MD Initiated Contact with Patient 07/01/13 1108     Chief Complaint  Patient presents with  . Chest Pain   (Consider location/radiation/quality/duration/timing/severity/associated sxs/prior Treatment) HPI  Patient presents with chest pain. Initially, the patient did have only chest pain, radiating from the anterior chest and left arm.  Over the interval 20 hours the pain has stopped and she now has diffuse numbness.  The numbness includes her oropharynx. She denies current dyspnea, confusion, disorientation, fever, chills, nausea. She does endorse anorexia. No clear precipitating, leaving, exacerbating factors. The patient was in her usual state of health symptoms. She has history of hypertension and hypercholesterolemia. She has family history of heart disease.   Past Medical History  Diagnosis Date  . Herpes simplex   . Sickle cell trait   . Asthma   . Anxiety   . Hypertension   . Hyperlysinemia   . Vitamin D deficiency    Past Surgical History  Procedure Laterality Date  . Tubal ligation    . Foot surgery     No family history on file. History  Substance Use Topics  . Smoking status: Never Smoker   . Smokeless tobacco: Not on file  . Alcohol Use: No   OB History   Grav Para Term Preterm Abortions TAB SAB Ect Mult Living                 Review of Systems  Constitutional:       Per HPI, otherwise negative  HENT:       Per HPI, otherwise negative  Respiratory:       Per HPI, otherwise negative  Cardiovascular:       Per HPI, otherwise negative  Gastrointestinal: Negative for nausea and vomiting.  Endocrine:       Negative aside from HPI  Genitourinary:       Neg aside from HPI   Musculoskeletal:       Per HPI, otherwise negative  Skin: Negative.   Neurological: Negative for syncope.    Allergies  Betadine  Home Medications   Current Outpatient Rx  Name  Route  Sig   Dispense  Refill  . benazepril (LOTENSIN) 20 MG tablet   Oral   Take 20 mg by mouth daily.         . cloNIDine (CATAPRES) 0.1 MG tablet   Oral   Take 0.05 mg by mouth 3 (three) times daily.          . pravastatin (PRAVACHOL) 40 MG tablet   Oral   Take 40 mg by mouth daily.          BP 146/84  Pulse 88  Temp(Src) 98.7 F (37.1 C) (Oral)  Resp 18  Ht 5' 5.5" (1.664 m)  Wt 180 lb (81.647 kg)  BMI 29.49 kg/m2  SpO2 99%  LMP 06/25/2013 Physical Exam  Nursing note and vitals reviewed. Constitutional: She is oriented to person, place, and time. She appears well-developed and well-nourished. No distress.  HENT:  Head: Normocephalic and atraumatic.  Eyes: Conjunctivae and EOM are normal.  Cardiovascular: Normal rate and regular rhythm.   Pulmonary/Chest: Effort normal and breath sounds normal. No stridor. No respiratory distress.  Abdominal: She exhibits no distension.  Musculoskeletal: She exhibits no edema.  Neurological: She is alert and oriented to person, place, and time. No cranial nerve deficit. She exhibits normal muscle tone. Coordination normal.  Patient with  no cerebellar deficits, no facial asymmetry, no strength deficiency  Skin: Skin is warm and dry.  Psychiatric: She has a normal mood and affect.    ED Course  Procedures (including critical care time) Labs Reviewed  CBC  COMPREHENSIVE METABOLIC PANEL  PROTIME-INR  URINALYSIS, ROUTINE W REFLEX MICROSCOPIC   No results found. No diagnosis found.   EKG is sinus rhythm, rate 77, nonspecific T wave changes laterally, that are minimally changed from prior.  This is abnormal.  2:54 PM Patient resting comfortably.  Vital signs remained similar.  Cardiac 90 sinus rhythm normal Pulse ox 100% room air normal MDM  Patient presents with one day of generalized complaints, that started with left-sided chest discomfort.  Patient has minimal risk profile for ACS, and negative troponin and likely reflects the  absence of ongoing coronary ischemia given the passage of time since onset of symptoms. With the patient's generalized complaints, we discussed, at length, the need for ongoing evaluation with her primary care physician, with consideration of thyroid testing, hormone testing. Absent any significant vital sign abnormalities, any evidence of distress, with reassuring labs, she is appropriate for outpatient evaluation.  Gerhard Munch, MD 07/01/13 [phone]

## 2013-07-01 NOTE — ED Notes (Signed)
Chest tightness and numbness at 0600 yesterday; went to work; numbness all over body including tongue and mouth. Chest pain was non radiating; denies nausea, vomiting.

## 2013-09-17 ENCOUNTER — Ambulatory Visit (INDEPENDENT_AMBULATORY_CARE_PROVIDER_SITE_OTHER): Payer: BC Managed Care – PPO | Admitting: Physician Assistant

## 2013-09-17 ENCOUNTER — Encounter: Payer: Self-pay | Admitting: Physician Assistant

## 2013-09-17 VITALS — BP 160/120 | HR 88 | Temp 98.2°F | Resp 18 | Wt 177.0 lb

## 2013-09-17 DIAGNOSIS — I10 Essential (primary) hypertension: Secondary | ICD-10-CM

## 2013-09-17 DIAGNOSIS — B9789 Other viral agents as the cause of diseases classified elsewhere: Secondary | ICD-10-CM

## 2013-09-17 MED ORDER — FLUTICASONE PROPIONATE 50 MCG/ACT NA SUSP: 2.0000 | Freq: Every day | NASAL | Status: DC

## 2013-09-17 MED ORDER — HYDROCHLOROTHIAZIDE 25 MG PO TABS: 25.0000 mg | ORAL_TABLET | Freq: Every day | ORAL | Status: DC

## 2013-09-17 MED ORDER — BENAZEPRIL HCL 20 MG PO TABS: 20.0000 mg | ORAL_TABLET | Freq: Every day | ORAL | Status: DC

## 2013-09-17 MED ORDER — CLONIDINE HCL 0.1 MG PO TABS: 0.0500 mg | ORAL_TABLET | Freq: Three times a day (TID) | ORAL | Status: DC

## 2013-09-17 NOTE — Progress Notes (Signed)
Patient ID: Mary Robles MRN: [id number], DOB: [birth year], 37 y.o. Date of Encounter: @DATE @  Chief Complaint:  Chief Complaint  Patient presents with  . BP 169/128 at work  feeling funny/dizzy    HPI: 37 y.o. year old AA female  Presents for evaluation of:  1- hypertension: Patient states that today she was at work and was feeling mild diffuse headache that she checked her blood pressure. .Reading as above of 169/128.  Says she has been taking her blood pressure medications on a routine basis. Takes are benazepril 20 mg every day and is taking her half of her clonidine 3 times a day. Has had no skips doses per her report. Does report that she has had some cold symptoms for the last 4 days. Has used Coricidin HBP and NyQuil.  2- nasal congestion and mild cough for past 4 days. No fevers or chills. Had some sore throat at the beginning but that has resolved .   Past Medical History  Diagnosis Date  . Herpes simplex   . Sickle cell trait   . Asthma   . Anxiety   . Hypertension   . Hyperlysinemia   . Vitamin D deficiency      Home Meds: See attached medication section for current medication list. Any medications entered into computer today will not appear on this note's list. The medications listed below were entered prior to today. Current Outpatient Prescriptions on File Prior to Visit  Medication Sig Dispense Refill  . cloNIDine (CATAPRES) 0.1 MG tablet Take 0.05 mg by mouth 3 (three) times daily.       . pravastatin (PRAVACHOL) 40 MG tablet Take 40 mg by mouth daily.       No current facility-administered medications on file prior to visit.    Allergies:  Allergies  Allergen Reactions  . Betadine [Povidone Iodine]     Reaction:Blisters    History   Social History  . Marital Status: Married    Spouse Name: N/A    Number of Children: N/A  . Years of Education: N/A   Occupational History  . Not on file.   Social History Main Topics  . Smoking status:  Never Smoker   . Smokeless tobacco: Not on file  . Alcohol Use: No  . Drug Use: No  . Sexual Activity:    Other Topics Concern  . Not on file   Social History Narrative  . No narrative on file    No family history on file.   Review of Systems:  See HPI for pertinent ROS. All other ROS negative.    Physical Exam: Blood pressure 160/120, pulse 88, temperature 98.2 F (36.8 C), temperature source Oral, resp. rate 18, weight 177 lb (80.287 kg)., Body mass index is 29 kg/(m^2). General: Alert well-developed American female .Appears in no acute distress. Head: Normocephalic, atraumatic, eyes without discharge, sclera non-icteric, nares are without discharge. Bilateral auditory canals clear, TM's are without perforation, pearly grey and translucent with reflective cone of light bilaterally. Oral cavity moist, posterior pharynx without exudate, erythema, peritonsillar abscess, or post nasal drip. Sinuses with no tenderness with percussion.  Neck: Supple. No thyromegaly. No lymphadenopathy. No carotid bruits. Lungs: Clear bilaterally to auscultation without wheezes, rales, or rhonchi. Breathing is unlabored. Heart: RRR with S1 S2. No murmurs, rubs, or gallops. Musculoskeletal:  Strength and tone normal for age. Extremities/Skin: Warm and dry. No clubbing or cyanosis. No edema. No rashes or suspicious lesions. Neuro: Alert and oriented X 3. Moves all  extremities spontaneously. Gait is normal. CNII-XII grossly in tact. Psych:  Responds to questions appropriately with a normal affect.     ASSESSMENT AND PLAN:  37 y.o. year old female with  1. Hypertension Continue benazepril 20 mg daily. Continue clonidine 0.5 mg 3 times a day. Add HCTZ 25 mg QD.  07/01/13 she had a CMAC that was normal with potassium 4.0 BUN 7 creatinine 0.82. I look office visit in 2 weeks to recheck blood pressure and bmet on the medications. - hydrochlorothiazide (HYDRODIURIL) 25 MG tablet; Take 1 tablet (25 mg total)  by mouth daily.  Dispense: 30 tablet; Refill: 0 - benazepril (LOTENSIN) 20 MG tablet; Take 1 tablet (20 mg total) by mouth daily.  Dispense: 30 tablet; Refill: 4  2. Viral respiratory infection - fluticasone (FLONASE) 50 MCG/ACT nasal spray; Place 2 sprays into the nose daily.  Dispense: 16 g; Refill: [address] [place], [place], [facility] 09/17/2013 5:18 PM

## 2013-09-21 ENCOUNTER — Telehealth: Payer: Self-pay | Admitting: Physician Assistant

## 2013-09-21 MED ORDER — AMLODIPINE BESYLATE 5 MG PO TABS: 5.0000 mg | ORAL_TABLET | Freq: Every day | ORAL | Status: DC

## 2013-09-21 NOTE — Telephone Encounter (Signed)
Since starting HCTZ last week has been having dizzy weak spells.  Esp when getting up.  Almost passed out Saturday night.  Told to stopp HCTZ until hears back from provider

## 2013-09-21 NOTE — Telephone Encounter (Signed)
Pt called aware of medication changes.  Has follow up appt.

## 2013-09-21 NOTE — Telephone Encounter (Signed)
Stay off of the HCTZ. Start amlodipine 5 mg one by mouth daily. #30 with 0 refills Followup office visit to recheck blood pressure in 2 weeks.

## 2013-09-21 NOTE — Telephone Encounter (Signed)
This needs to be forwarded to [facility].

## 2013-09-21 NOTE — Telephone Encounter (Signed)
Please call her regarding medication she was given last week [phone]

## 2013-09-30 ENCOUNTER — Ambulatory Visit (INDEPENDENT_AMBULATORY_CARE_PROVIDER_SITE_OTHER): Payer: BC Managed Care – PPO | Admitting: Family Medicine

## 2013-09-30 ENCOUNTER — Encounter: Payer: Self-pay | Admitting: Family Medicine

## 2013-09-30 VITALS — BP 120/80 | HR 78 | Temp 98.3°F | Resp 18 | Wt 174.0 lb

## 2013-09-30 DIAGNOSIS — I10 Essential (primary) hypertension: Secondary | ICD-10-CM

## 2013-09-30 LAB — BASIC METABOLIC PANEL
BUN: 9 mg/dL (ref 6–23)
Calcium: 9.2 mg/dL (ref 8.4–10.5)
Chloride: 103 mEq/L (ref 96–112)
Creat: 0.78 mg/dL (ref 0.50–1.10)
Sodium: 138 mEq/L (ref 135–145)

## 2013-09-30 NOTE — Assessment & Plan Note (Signed)
BP looks good today on 3 drug regimen Continue Discussed if she can get 10pounds off her weight, increase water, potassium in diet we may be able to start titrating off medications. F/U 3 months BMET obtained today

## 2013-09-30 NOTE — Patient Instructions (Signed)
Continue current medications Continue to record blood pressure , call for any concerns Flu shot given  F/U 3 months

## 2013-09-30 NOTE — Progress Notes (Signed)
  Subjective:    Patient ID: Mary Robles, female    DOB: 18-Oct-1976, 37 y.o.   MRN: [id number]  HPI  Pt here to f/u HTN. 2 weeks ago, started on HCTZ with her clonidine and lotensin but had dizziness and headache therefore medication stopped. Norvasc 5mg  was started instead. She monitors BP at home 130's/ 80. She wants to come off of medications and has started working out on regular basis and changing diet. Hast lost 3lbs since last visit 2 weeks ago.  No further HA or dizzy spells  Review of Systems - per above  GEN- denies fatigue, fever, weight loss,weakness, recent illness HEENT- denies eye drainage, change in vision, nasal discharge, CVS- denies chest pain, palpitations RESP- denies SOB, cough, wheeze Neuro- denies headache, dizziness, syncope, seizure activity       Objective:   Physical Exam GEN- NAD, alert and oriented x3 CVS- RRR, no murmur RESP-CTAB EXT- No edema Pulses- Radial 2+        Assessment & Plan:

## 2013-10-16 ENCOUNTER — Telehealth: Payer: Self-pay | Admitting: Family Medicine

## 2013-10-16 ENCOUNTER — Other Ambulatory Visit: Payer: Self-pay | Admitting: Family Medicine

## 2013-10-16 MED ORDER — AMLODIPINE BESYLATE 5 MG PO TABS: 5.0000 mg | ORAL_TABLET | Freq: Every day | ORAL | Status: DC

## 2013-10-16 MED ORDER — PRAVASTATIN SODIUM 40 MG PO TABS: 40.0000 mg | ORAL_TABLET | Freq: Every day | ORAL | Status: DC

## 2013-10-16 NOTE — Telephone Encounter (Signed)
Meds refilled.

## 2013-10-16 NOTE — Telephone Encounter (Signed)
Medication refilled per protocol. 

## 2013-10-22 ENCOUNTER — Other Ambulatory Visit: Payer: Self-pay

## 2013-11-16 ENCOUNTER — Encounter: Payer: Self-pay | Admitting: Obstetrics and Gynecology

## 2013-12-31 ENCOUNTER — Ambulatory Visit (INDEPENDENT_AMBULATORY_CARE_PROVIDER_SITE_OTHER): Payer: BC Managed Care – PPO | Admitting: Family Medicine

## 2013-12-31 ENCOUNTER — Encounter: Payer: Self-pay | Admitting: Family Medicine

## 2013-12-31 VITALS — BP 126/88 | HR 73 | Temp 98.3°F | Resp 18 | Ht 66.0 in | Wt 180.0 lb

## 2013-12-31 DIAGNOSIS — M722 Plantar fascial fibromatosis: Secondary | ICD-10-CM

## 2013-12-31 DIAGNOSIS — I10 Essential (primary) hypertension: Secondary | ICD-10-CM

## 2013-12-31 LAB — COMPLETE METABOLIC PANEL WITH GFR
ALK PHOS: 43 U/L (ref 39–117)
ALT: 14 U/L (ref 0–35)
AST: 17 U/L (ref 0–37)
Albumin: 3.9 g/dL (ref 3.5–5.2)
BILIRUBIN TOTAL: 0.6 mg/dL (ref 0.3–1.2)
BUN: 10 mg/dL (ref 6–23)
CO2: 24 mEq/L (ref 19–32)
CREATININE: 0.74 mg/dL (ref 0.50–1.10)
Calcium: 8.7 mg/dL (ref 8.4–10.5)
Chloride: 103 mEq/L (ref 96–112)
GLUCOSE: 80 mg/dL (ref 70–99)
Potassium: 4.2 mEq/L (ref 3.5–5.3)
Sodium: 136 mEq/L (ref 135–145)
Total Protein: 6.8 g/dL (ref 6.0–8.3)

## 2013-12-31 LAB — LIPID PANEL
CHOL/HDL RATIO: 3.1 ratio
CHOLESTEROL: 108 mg/dL (ref 0–200)
HDL: 35 mg/dL — ABNORMAL LOW (ref >39)
LDL Cholesterol: 61 mg/dL (ref 0–99)
TRIGLYCERIDES: 60 mg/dL (ref <150)
VLDL: 12 mg/dL (ref 0–40)

## 2013-12-31 NOTE — Progress Notes (Signed)
   Subjective:    Patient ID: Mary Robles, female    DOB: 29-Apr-1976, 38 y.o.   MRN: [id number]  HPI For recheck of her blood pressure. Taking amlodipine 5 mg by mouth daily, benazepril 20 mg by mouth daily, and clonidine 0.05 mg by mouth 3 times a day. She denies any chest pain, shortness of breath, dyspnea on exertion. She does complain of pain in both of her heels near the insertion of the plantar fascia. The pain worsens throughout the day the more she rubs on her feet. Past Medical History  Diagnosis Date  . Herpes simplex   . Sickle cell trait   . Asthma   . Anxiety   . Hypertension   . Hyperlysinemia   . Vitamin D deficiency    Current Outpatient Prescriptions on File Prior to Visit  Medication Sig Dispense Refill  . amLODipine (NORVASC) 5 MG tablet Take 1 tablet (5 mg total) by mouth daily.  30 tablet  2  . benazepril (LOTENSIN) 20 MG tablet Take 1 tablet (20 mg total) by mouth daily.  30 tablet  4  . cloNIDine (CATAPRES) 0.1 MG tablet Take 0.5 tablets (0.05 mg total) by mouth 3 (three) times daily.  60 tablet  4  . fluticasone (FLONASE) 50 MCG/ACT nasal spray Place 2 sprays into the nose daily.  16 g  6  . pravastatin (PRAVACHOL) 40 MG tablet Take 1 tablet (40 mg total) by mouth daily.  30 tablet  2   No current facility-administered medications on file prior to visit.   Allergies  Allergen Reactions  . Betadine [Povidone Iodine]     Reaction:Blisters   History   Social History  . Marital Status: Married    Spouse Name: N/A    Number of Children: N/A  . Years of Education: N/A   Occupational History  . Not on file.   Social History Main Topics  . Smoking status: Never Smoker   . Smokeless tobacco: Not on file  . Alcohol Use: No  . Drug Use: No  . Sexual Activity:    Other Topics Concern  . Not on file   Social History Narrative  . No narrative on file      Review of Systems  All other systems reviewed and are negative.       Objective:   Physical Exam  Vitals reviewed. Neck: Neck supple. No JVD present. No thyromegaly present.  Cardiovascular: Normal rate, regular rhythm and normal heart sounds.  Exam reveals no gallop and no friction rub.   No murmur heard. Pulmonary/Chest: Effort normal and breath sounds normal. No respiratory distress. She has no wheezes. She has no rales.  Abdominal: Soft. Bowel sounds are normal. She exhibits no distension and no mass. There is no tenderness. There is no rebound and no guarding.  Lymphadenopathy:    She has no cervical adenopathy.   patient is mildly tender to palpation on the plantar aspect of both heels and the insertion of the plantar fascia        Assessment & Plan:  1. HTN (hypertension) Pressures well controlled today. Change clonidine to 0.1 mg by mouth twice a day for convenience. Also check a fasting lipid panel. Goal LDL is less than 130. - COMPLETE METABOLIC PANEL WITH GFR - Lipid panel  2. Plantar fasciitis, bilateral I recommended daily stretches to make the plantar fascia more flexible. Also recommended insoles for her shoes.Marland Kitchen

## 2014-01-02 ENCOUNTER — Encounter: Payer: Self-pay | Admitting: *Deleted

## 2014-01-14 ENCOUNTER — Encounter: Payer: Self-pay | Admitting: Obstetrics and Gynecology

## 2014-01-15 ENCOUNTER — Other Ambulatory Visit: Payer: Self-pay | Admitting: Family Medicine

## 2014-01-15 ENCOUNTER — Ambulatory Visit: Payer: Self-pay | Admitting: Obstetrics and Gynecology

## 2014-01-15 NOTE — Telephone Encounter (Signed)
Medication refilled per protocol. 

## 2014-01-21 ENCOUNTER — Encounter: Payer: Self-pay | Admitting: Obstetrics and Gynecology

## 2014-01-21 ENCOUNTER — Ambulatory Visit (INDEPENDENT_AMBULATORY_CARE_PROVIDER_SITE_OTHER): Payer: BC Managed Care – PPO | Admitting: Obstetrics and Gynecology

## 2014-01-21 VITALS — BP 119/71 | HR 80 | Resp 16 | Ht 65.75 in | Wt 181.0 lb

## 2014-01-21 DIAGNOSIS — Z01419 Encounter for gynecological examination (general) (routine) without abnormal findings: Secondary | ICD-10-CM

## 2014-01-21 DIAGNOSIS — Z0000 Encounter for general adult medical examination without abnormal findings: Secondary | ICD-10-CM

## 2014-01-21 DIAGNOSIS — M25559 Pain in unspecified hip: Secondary | ICD-10-CM

## 2014-01-21 LAB — POCT URINALYSIS DIPSTICK
BILIRUBIN UA: NEGATIVE
Blood, UA: 50
Glucose, UA: NEGATIVE
KETONES UA: NEGATIVE
Leukocytes, UA: NEGATIVE
Nitrite, UA: NEGATIVE
PH UA: 5
PROTEIN UA: NEGATIVE
Urobilinogen, UA: NEGATIVE

## 2014-01-21 NOTE — Addendum Note (Signed)
Addended by: Tacy Learn, BROOK E on: 01/21/2014 05:04 PM   Modules accepted: Orders

## 2014-01-21 NOTE — Progress Notes (Signed)
GYNECOLOGY VISIT  PCP: Jenna Luo [facility])  Referring provider:   HPI: 38 y.o.   Married  [place] American  female   [phone] with Patient's last menstrual period was 01/02/2014.   here for   Annual Exam/ Severe Abd pain/cramping before, during and after cycle for the last 3 months. Having breakthrough bleeding between Dec and Jan cycles. This is the first time ever. Feels lower abdominal and lower back discomfort.  Ibuprofen helps. Clotting increasing.  Pad change three - four times a day.  Took birth control pills in the past but stopped due to elevated blood pressure.   Seen at the [facility] in 2011 for pain and was diagnosed with a small fibroid.  Had a CT one year ago - 11 cm right abdominal wall lipoma.    Would like to do an ultrasound to evaluate pelvic pain.   Hgb: PCP   Urine:   66 Blood - Saw Urology and this was evaluated by CT and cystoscopy - told hematuria was due to antiHTN med  GYNECOLOGIC HISTORY: Patient's last menstrual period was 01/02/2014. Sexually active: yes  Partner preference: female Contraception:   BTSP 2005 Menopausal hormone therapy: no DES exposure:   no Blood transfusions:   no Sexually transmitted diseases:   HSV GYN Procedures:  ? Cryo  At age 29 or 16 Mammogram:   2012 normal.              Pap:   12/22/10 neg, HPV testing was not done History of abnormal pap smear:  Yes at age 16 or 1  (Dysplasia)   OB History   Grav Para Term Preterm Abortions TAB SAB Ect Mult Living   4 2   1 1    2        LIFESTYLE: Exercise:    No           Tobacco: no Alcohol: occ glass of wine Drug use:  no  OTHER HEALTH MAINTENANCE: Tetanus/TDap: 2013 Gardisil: no Influenza:  2014 Zostavax: no  Bone density: no Colonoscopy: no  Cholesterol check: 12/31/13  taking medication  Family History  Problem Relation Age of Onset  . Diabetes Mother   . Thyroid disease Mother   . Mental retardation Maternal Aunt   . Diabetes  Maternal Grandmother     Patient Active Problem List   Diagnosis Date Noted  . Hypertension   . Vitamin D deficiency    Past Medical History  Diagnosis Date  . Herpes simplex   . Sickle cell trait   . Asthma   . Anxiety   . Hypertension   . Hyperlysinemia   . Vitamin D deficiency   . Depression     Past Surgical History  Procedure Laterality Date  . Tubal ligation    . Foot surgery      ALLERGIES: Betadine  Current Outpatient Prescriptions  Medication Sig Dispense Refill  . amLODipine (NORVASC) 5 MG tablet TAKE ONE TABLET BY MOUTH ONCE DAILY  30 tablet  5  . benazepril (LOTENSIN) 20 MG tablet Take 1 tablet (20 mg total) by mouth daily.  30 tablet  4  . cloNIDine (CATAPRES) 0.1 MG tablet Take 0.5 tablets (0.05 mg total) by mouth 3 (three) times daily.  60 tablet  4  . fluticasone (FLONASE) 50 MCG/ACT nasal spray Place 2 sprays into the nose daily.  16 g  6  . pravastatin (PRAVACHOL) 40 MG tablet TAKE ONE TABLET BY MOUTH ONCE DAILY  30  tablet  5   No current facility-administered medications for this visit.     ROS:  Pertinent items are noted in HPI.  SOCIAL HISTORY:  Married.  Education officer, museum.    PHYSICAL EXAMINATION:    BP 119/71  Pulse 80  Resp 16  Ht 5' 5.75" (1.67 m)  Wt 181 lb (82.101 kg)  BMI 29.44 kg/m2  LMP 01/02/2014   Wt Readings from Last 3 Encounters:  01/21/14 181 lb (82.101 kg)  12/31/13 180 lb (81.647 kg)  09/30/13 174 lb (78.926 kg)     Ht Readings from Last 3 Encounters:  01/21/14 5' 5.75" (1.67 m)  12/31/13 5\' 6"  (1.676 m)  07/01/13 5' 5.5" (1.664 m)    General appearance: alert, cooperative and appears stated age Head: Normocephalic, without obvious abnormality, atraumatic Neck: no adenopathy, supple, symmetrical, trachea midline and thyroid not enlarged, symmetric, no tenderness/mass/nodules Lungs: clear to auscultation bilaterally Breasts: Inspection negative, No nipple retraction or dimpling, No nipple discharge or bleeding, No  axillary or supraclavicular adenopathy, Normal to palpation without dominant masses Heart: regular rate and rhythm Abdomen: soft, non-tender; no masses,  no organomegaly Extremities: extremities normal, atraumatic, no cyanosis or edema Skin: Skin color, texture, turgor normal. No rashes or lesions Lymph nodes: Cervical, supraclavicular, and axillary nodes normal. No abnormal inguinal nodes palpated Neurologic: Grossly normal  Pelvic: External genitalia:  no lesions              Urethra:  normal appearing urethra with no masses, tenderness or lesions              Bartholins and Skenes: normal                 Vagina: normal appearing vagina with normal color and discharge, no lesions              Cervix: normal appearance              Pap and high risk HPV testing done: yes.            Bimanual Exam:  Uterus:  uterus is normal size, shape, consistency and nontender                                      Adnexa: normal adnexa in size, nontender and no masses                                      Rectovaginal: Confirms                                      Anus:  normal sphincter tone, no lesions  ASSESSMENT  Normal gynecologic exam. Pelvic pain. Status post BTL. History of small uterine fibroid. Irregular bleeding - one isolated episode.  History of abdominal wall lipoma. Hematuria.  Work up negative.  PLAN  Mammogram age 82. Pap smear and high risk HPV testing performed. Return for pelvic ultrasound.  Return annually or prn   An After Visit Summary was printed and given to the patient.

## 2014-01-21 NOTE — Patient Instructions (Signed)

## 2014-01-25 ENCOUNTER — Telehealth: Payer: Self-pay | Admitting: Obstetrics and Gynecology

## 2014-01-25 NOTE — Telephone Encounter (Signed)
Telephoned patient/ advised of $50 copay for PUS/ scheduled PUS/ advised patient of cancellation policy and cancellation fee/ patient agreeable//ssf

## 2014-01-26 ENCOUNTER — Ambulatory Visit (INDEPENDENT_AMBULATORY_CARE_PROVIDER_SITE_OTHER): Payer: BC Managed Care – PPO | Admitting: Family Medicine

## 2014-01-26 ENCOUNTER — Encounter: Payer: Self-pay | Admitting: Family Medicine

## 2014-01-26 VITALS — BP 110/80 | HR 80 | Temp 98.4°F | Resp 18 | Ht 65.0 in | Wt 179.0 lb

## 2014-01-26 DIAGNOSIS — B9789 Other viral agents as the cause of diseases classified elsewhere: Secondary | ICD-10-CM

## 2014-01-26 DIAGNOSIS — R52 Pain, unspecified: Secondary | ICD-10-CM

## 2014-01-26 DIAGNOSIS — B349 Viral infection, unspecified: Secondary | ICD-10-CM

## 2014-01-26 LAB — CBC W/MCH & 3 PART DIFF
HCT: 40.7 % (ref 36.0–46.0)
HEMOGLOBIN: 13.9 g/dL (ref 12.0–15.0)
LYMPHS PCT: 40 % (ref 12–46)
Lymphs Abs: 3.4 10*3/uL (ref 0.7–4.0)
MCH: 28.3 pg (ref 26.0–34.0)
MCHC: 34.2 g/dL (ref 30.0–36.0)
MCV: 82.7 fL (ref 78.0–100.0)
NEUTROS ABS: 4.3 10*3/uL (ref 1.7–7.7)
NEUTROS PCT: 52 % (ref 43–77)
Platelets: 306 10*3/uL (ref 150–400)
RBC: 4.92 MIL/uL (ref 3.87–5.11)
RDW: 16.8 % — ABNORMAL HIGH (ref 11.5–15.5)
WBC mixed population %: 8 % (ref 3–18)
WBC mixed population: 0.7 10*3/uL (ref 0.1–1.8)
WBC: 8.4 10*3/uL (ref 4.0–10.5)

## 2014-01-26 LAB — INFLUENZA A AND B
INFLUENZA A AG: NEGATIVE
INFLUENZA B AG: NEGATIVE

## 2014-01-26 LAB — IPS PAP TEST WITH HPV

## 2014-01-26 MED ORDER — GUAIFENESIN-CODEINE 100-10 MG/5ML PO SOLN: 10.0000 mL | Freq: Four times a day (QID) | ORAL | Status: DC | PRN

## 2014-01-26 NOTE — Progress Notes (Addendum)
Patient ID: Mary Robles, female   DOB: 27-Jan-1976, 38 y.o.   MRN: [id number]    Subjective:    Patient ID: Mary Robles, female    DOB: 1976-09-26, 38 y.o.   MRN: [id number]  Patient presents for Generalized Body Aches, Chest Pain and left ear pain  Patient here with body aches ear pain mild cough for the past 24 hours. Positive sick contact with her children. She did receive a flu shot. She's also had fatigue. She's not had any fever in and has only taken ibuprofen over-the-counter for the muscle aches. She also experiences tingling in her feet and hands last night as well. She's not had any diarrhea or vomiting.   Review Of Systems:  GEN- denies fatigue, fever, weight loss,weakness, recent illness HEENT- denies eye drainage, change in vision, nasal discharge, CVS- denies chest pain, palpitations RESP- denies SOB, cough, wheeze ABD- denies N/V, change in stools, abd pain GU- denies dysuria, hematuria, dribbling, incontinence MSK- denies joint pain, muscle aches, injury Neuro- denies headache, dizziness, syncope, seizure activity       Objective:    BP 110/80  Pulse 80  Temp(Src) 98.4 F (36.9 C) (Oral)  Resp 18  Ht 5\' 5"  (1.651 m)  Wt 179 lb (81.194 kg)  BMI 29.79 kg/m2  LMP 01/02/2014 GEN- NAD, alert and oriented x3 HEENT- PERRL, EOMI, non injected sclera, pink conjunctiva, MMM, oropharynx clear, TM clear bilat Neck- Supple, no LAD CVS- RRR, no murmur RESP-CTAB ABD-NABS,soft,NT,ND EXT- No edema Pulses- Radial 2+        Assessment & Plan:      Problem List Items Addressed This Visit   None    Visit Diagnoses   Viral illness    -  Primary    Flu negative, nml exam tincture of time, robitussin codiene given, call if anything worsens, expect she will worsen with more symptoms before she improves    Body aches        Relevant Orders       Influenza a and b (Completed)       CBC w/MCH & 3 Part Diff (Completed)       Note: This dictation was prepared  with Dragon dictation along with smaller phrase technology. Any transcriptional errors that result from this process are unintentional.

## 2014-01-26 NOTE — Patient Instructions (Signed)
Viral illness Take ibuprofen or motrin Robitussin with codiene Call if not better

## 2014-01-28 ENCOUNTER — Telehealth: Payer: Self-pay | Admitting: *Deleted

## 2014-01-28 NOTE — Telephone Encounter (Signed)
Pt called stating that she was here the other day and stated that to call back if she does not feel better to call and you will call something in for her.

## 2014-01-29 ENCOUNTER — Emergency Department (HOSPITAL_BASED_OUTPATIENT_CLINIC_OR_DEPARTMENT_OTHER): Payer: BC Managed Care – PPO

## 2014-01-29 ENCOUNTER — Emergency Department (HOSPITAL_BASED_OUTPATIENT_CLINIC_OR_DEPARTMENT_OTHER)
Admission: EM | Admit: 2014-01-29 | Discharge: 2014-01-30 | Disposition: A | Discharge status: Home / Self Care | Payer: BC Managed Care – PPO | Attending: Emergency Medicine | Admitting: Emergency Medicine

## 2014-01-29 ENCOUNTER — Encounter (HOSPITAL_BASED_OUTPATIENT_CLINIC_OR_DEPARTMENT_OTHER): Payer: Self-pay | Admitting: Emergency Medicine

## 2014-01-29 DIAGNOSIS — IMO0002 Reserved for concepts with insufficient information to code with codable children: Secondary | ICD-10-CM | POA: Insufficient documentation

## 2014-01-29 DIAGNOSIS — F3289 Other specified depressive episodes: Secondary | ICD-10-CM | POA: Insufficient documentation

## 2014-01-29 DIAGNOSIS — Z3202 Encounter for pregnancy test, result negative: Secondary | ICD-10-CM | POA: Insufficient documentation

## 2014-01-29 DIAGNOSIS — Z8639 Personal history of other endocrine, nutritional and metabolic disease: Secondary | ICD-10-CM | POA: Insufficient documentation

## 2014-01-29 DIAGNOSIS — M79622 Pain in left upper arm: Secondary | ICD-10-CM

## 2014-01-29 DIAGNOSIS — J45901 Unspecified asthma with (acute) exacerbation: Secondary | ICD-10-CM | POA: Insufficient documentation

## 2014-01-29 DIAGNOSIS — M79609 Pain in unspecified limb: Secondary | ICD-10-CM | POA: Insufficient documentation

## 2014-01-29 DIAGNOSIS — Z862 Personal history of diseases of the blood and blood-forming organs and certain disorders involving the immune mechanism: Secondary | ICD-10-CM | POA: Insufficient documentation

## 2014-01-29 DIAGNOSIS — Z8619 Personal history of other infectious and parasitic diseases: Secondary | ICD-10-CM | POA: Insufficient documentation

## 2014-01-29 DIAGNOSIS — Z79899 Other long term (current) drug therapy: Secondary | ICD-10-CM | POA: Insufficient documentation

## 2014-01-29 DIAGNOSIS — R5383 Other fatigue: Secondary | ICD-10-CM

## 2014-01-29 DIAGNOSIS — I10 Essential (primary) hypertension: Secondary | ICD-10-CM | POA: Insufficient documentation

## 2014-01-29 DIAGNOSIS — F329 Major depressive disorder, single episode, unspecified: Secondary | ICD-10-CM | POA: Insufficient documentation

## 2014-01-29 DIAGNOSIS — Z792 Long term (current) use of antibiotics: Secondary | ICD-10-CM | POA: Insufficient documentation

## 2014-01-29 DIAGNOSIS — F411 Generalized anxiety disorder: Secondary | ICD-10-CM | POA: Insufficient documentation

## 2014-01-29 DIAGNOSIS — R5381 Other malaise: Secondary | ICD-10-CM | POA: Insufficient documentation

## 2014-01-29 MED ORDER — AZITHROMYCIN 250 MG PO TABS: ORAL_TABLET | ORAL | Status: DC

## 2014-01-29 NOTE — ED Notes (Signed)
Onset yesterday, intermittent but constant now. Numbness and tingling from left neck down to left arm/finger and left leg. Sensation intact. Equal strength, no facial droop, no drift. Hx of anxiety. Previous hx of same but "not as this bad"

## 2014-01-29 NOTE — Telephone Encounter (Signed)
zpak sent, continue cough medication

## 2014-01-29 NOTE — Addendum Note (Signed)
Addended by: Vic Blackbird F on: 01/29/2014 08:00 AM   Modules accepted: Orders

## 2014-01-29 NOTE — ED Notes (Addendum)
Numbness on her left side x 3 days. She saw her MD on Tuesday for sob and pain in her left arm. She was started on Zithromax for virus per pt. Her MD told her the numbness was from the virus. She is ambulatory. Neuro intact. States the last time she felt this was she was diagnosed with panic attacks.

## 2014-01-29 NOTE — ED Provider Notes (Signed)
CSN: [id number]     Arrival date & time 01/29/14  2118 History  This chart was scribed for Rakeisha Nyce Alfonso Patten, MD by Donato Schultz, ED Scribe. This patient was seen in room MH03/MH03 and the patient's care was started at 11:32 PM.     Chief Complaint  Patient presents with  . Numbness     Patient is a 38 y.o. female presenting with extremity pain. The history is provided by the patient. No language interpreter was used.  Extremity Pain This is a new problem. The current episode started more than 2 days ago. The problem occurs constantly. The problem has not changed since onset.Associated symptoms include shortness of breath. Pertinent negatives include no headaches. Associated symptoms comments: fatigue. Nothing aggravates the symptoms. Nothing relieves the symptoms. She has tried nothing for the symptoms. The treatment provided no relief.   HPI Comments: Mary Robles is a 38 y.o. female who presents to the Emergency Department complaining of constant she says numbness but when you push on the affected areas she states they are painful that started three days ago  from her behind her left ear patchy spot on her neck  And in her left axilla.  She denies any recent swelling to her feet. No long car trips or plane trips. The patient states that she was started on Zithromax by Dr. Desmond Dike for a virus which consisted of these symptoms fatigue and shortness of breath.  The patient states that she told her PCP about her symptoms and was advised that it was due to her viral illness and would get worse before it got better.  The patient states that she experienced numbness and fatigue with the virus.  She denies any recent trauma.  She denies taking any OTC supplements. No DOE.  No CP.  No n/v/d.     Past Medical History  Diagnosis Date  . Herpes simplex   . Sickle cell trait   . Asthma   . Anxiety   . Hypertension   . Hyperlysinemia   . Vitamin D deficiency   . Depression    Past Surgical  History  Procedure Laterality Date  . Tubal ligation    . Foot surgery     Family History  Problem Relation Age of Onset  . Diabetes Mother   . Thyroid disease Mother   . Mental retardation Maternal Aunt   . Diabetes Maternal Grandmother    History  Substance Use Topics  . Smoking status: Never Smoker   . Smokeless tobacco: Never Used  . Alcohol Use: 0.5 oz/week    1 drink(s) per week     Comment: occ glass of wine   OB History   Grav Para Term Preterm Abortions TAB SAB Ect Mult Living   4 2   1 1    2      Review of Systems  Respiratory: Positive for shortness of breath.   Neurological: Negative for headaches.  All other systems reviewed and are negative.      Allergies  Betadine  Home Medications   Current Outpatient Rx  Name  Route  Sig  Dispense  Refill  . amLODipine (NORVASC) 5 MG tablet      TAKE ONE TABLET BY MOUTH ONCE DAILY   30 tablet   5   . azithromycin (ZITHROMAX) 250 MG tablet      Take 2 tablets x 1 day, then 1 tab daily for 4 days   6 tablet   0   .  benazepril (LOTENSIN) 20 MG tablet   Oral   Take 1 tablet (20 mg total) by mouth daily.   30 tablet   4   . cloNIDine (CATAPRES) 0.1 MG tablet   Oral   Take 0.5 tablets (0.05 mg total) by mouth 3 (three) times daily.   60 tablet   4   . fluticasone (FLONASE) 50 MCG/ACT nasal spray   Nasal   Place 2 sprays into the nose daily.   16 g   6   . guaiFENesin-codeine 100-10 MG/5ML syrup   Oral   Take 10 mLs by mouth every 6 (six) hours as needed for cough.   180 mL   0   . pravastatin (PRAVACHOL) 40 MG tablet      TAKE ONE TABLET BY MOUTH ONCE DAILY   30 tablet   5    Triage Vitals: BP 155/77  Pulse 98  Temp(Src) 98.2 F (36.8 C) (Oral)  Resp 20  Ht 5\' 5"  (1.651 m)  Wt 180 lb (81.647 kg)  BMI 29.95 kg/m2  SpO2 100%  LMP 01/02/2014  Physical Exam  Nursing note and vitals reviewed. Constitutional: She is oriented to person, place, and time. She appears well-developed  and well-nourished. No distress.  HENT:  Head: Normocephalic and atraumatic.  Mouth/Throat: Oropharynx is clear and moist. No oropharyngeal exudate.  Eyes: EOM are normal. Pupils are equal, round, and reactive to light.  Neck: Normal range of motion. Neck supple. No JVD present. No tracheal deviation present.  No cervical axilla supraclavicular nor epitrochilear nor groin lymph nodes  Cardiovascular: Normal rate and intact distal pulses.   Good radial pulses.  Capillary refill in both hands less than 2 seconds.     Pulmonary/Chest: Effort normal. No stridor. No respiratory distress. She has no wheezes. She has no rales.  Abdominal: Soft. Bowel sounds are normal. There is no tenderness. There is no rebound and no guarding.  Musculoskeletal: Normal range of motion.  no stepoffs nor tenderness of the C T or L spine.    Lymphadenopathy:    She has no cervical adenopathy.    She has no axillary adenopathy.       Right: No supraclavicular and no epitrochlear adenopathy present.       Left: No supraclavicular and no epitrochlear adenopathy present.  Neurological: She is alert and oriented to person, place, and time. She has normal reflexes. She displays normal reflexes. No cranial nerve deficit. She exhibits normal muscle tone. Coordination normal.   Intact biceps and triceps bilaterally. Symmetric strength and bulk. 5/5 strength In all four extremities.  Sensation intact.  Equal wamth of all 4 extremities.  2+ radial pulses B cap refill < 2 sec to all digits of B hands  Skin: Skin is warm and dry. She is not diaphoretic.  Right hand colder than left.   Psychiatric: Her behavior is normal. Her mood appears anxious.    ED Course  Procedures (including critical care time)  DIAGNOSTIC STUDIES: Oxygen Saturation is 100% on room air, normal by my interpretation.    COORDINATION OF CARE: 11:39 PM- Discussed obtaining blood work with the patient. Advised the patient to follow-up with her PCP.  The  patient agreed to the treatment plan.    Labs Review Labs Reviewed - No data to display Imaging Review No results found.  EKG Interpretation   None       MDM   Final diagnoses:  None    Date: 01/30/2014  Rate: 95  Rhythm: normal sinus rhythm  QRS Axis: normal  Intervals: normal  ST/T Wave abnormalities: nonspecific ST changes  Conduction Disutrbances:none  Narrative Interpretation:   Old EKG Reviewed: unchanged   Patient is describing pain not numbness. Has normal if not hyperesthesia on exam.  Has normal CT scan of the head.  Pain is worst in the left axilla and patient flinches when you touch that area excluding numbness.  Will rule out with 2 troponins.  EKG unchanged.  Doubt PE and dimer is negative.  Rule out for ACS.  Follow up with your PMD for ongoing testing and treatment  I personally performed the services described in this documentation, which was scribed in my presence. The recorded information has been reviewed and is accurate.     Carlisle Beers, MD 01/30/14 [id number]

## 2014-01-29 NOTE — Telephone Encounter (Signed)
Pt aware and new work note faxed over to pt

## 2014-01-30 LAB — CBC WITH DIFFERENTIAL/PLATELET
Basophils Absolute: 0 10*3/uL (ref 0.0–0.1)
Basophils Relative: 0 % (ref 0–1)
Eosinophils Absolute: 0.1 10*3/uL (ref 0.0–0.7)
Eosinophils Relative: 1 % (ref 0–5)
HCT: 39.1 % (ref 36.0–46.0)
HEMOGLOBIN: 14.1 g/dL (ref 12.0–15.0)
LYMPHS ABS: 3.4 10*3/uL (ref 0.7–4.0)
Lymphocytes Relative: 31 % (ref 12–46)
MCH: 28 pg (ref 26.0–34.0)
MCHC: 36.1 g/dL — ABNORMAL HIGH (ref 30.0–36.0)
MCV: 77.7 fL — ABNORMAL LOW (ref 78.0–100.0)
MONOS PCT: 9 % (ref 3–12)
Monocytes Absolute: 1 10*3/uL (ref 0.1–1.0)
NEUTROS ABS: 6.2 10*3/uL (ref 1.7–7.7)
Neutrophils Relative %: 58 % (ref 43–77)
Platelets: 272 10*3/uL (ref 150–400)
RBC: 5.03 MIL/uL (ref 3.87–5.11)
RDW: 14.3 % (ref 11.5–15.5)
WBC: 10.7 10*3/uL — ABNORMAL HIGH (ref 4.0–10.5)

## 2014-01-30 LAB — BASIC METABOLIC PANEL
BUN: 7 mg/dL (ref 6–23)
CHLORIDE: 101 meq/L (ref 96–112)
CO2: 23 meq/L (ref 19–32)
CREATININE: 0.8 mg/dL (ref 0.50–1.10)
Calcium: 9.4 mg/dL (ref 8.4–10.5)
GFR calc Af Amer: >90 mL/min (ref >90)
GFR calc non Af Amer: >90 mL/min (ref >90)
GLUCOSE: 86 mg/dL (ref 70–99)
POTASSIUM: 4 meq/L (ref 3.7–5.3)
Sodium: 139 mEq/L (ref 137–147)

## 2014-01-30 LAB — POCT I-STAT TROPONIN I
TROPONIN I, POC: 0.02 ng/mL (ref 0.00–0.08)
TROPONIN I, POC: 0.07 ng/mL (ref 0.00–0.08)

## 2014-01-30 LAB — D-DIMER, QUANTITATIVE: D-Dimer, Quant: <0.27 ug/mL-FEU (ref 0.00–0.48)

## 2014-01-30 LAB — PREGNANCY, URINE: Preg Test, Ur: NEGATIVE

## 2014-01-30 MED ORDER — GI COCKTAIL ~~LOC~~
30.0000 mL | Freq: Once | ORAL | Status: AC
  Administered 2014-01-30: 30 mL via ORAL
  Filled 2014-01-30: qty 30

## 2014-01-30 MED ORDER — MELOXICAM 7.5 MG PO TABS: 7.5000 mg | ORAL_TABLET | Freq: Every day | ORAL | Status: DC

## 2014-01-30 MED ORDER — SODIUM CHLORIDE 0.9 % IV BOLUS (SEPSIS)
1000.0000 mL | Freq: Once | INTRAVENOUS | Status: AC
  Administered 2014-01-30: 1000 mL via INTRAVENOUS

## 2014-01-30 MED ORDER — OXYCODONE-ACETAMINOPHEN 5-325 MG PO TABS
1.0000 | ORAL_TABLET | Freq: Once | ORAL | Status: AC
  Administered 2014-01-30: 1 via ORAL
  Filled 2014-01-30: qty 1

## 2014-01-30 MED ORDER — KETOROLAC TROMETHAMINE 30 MG/ML IJ SOLN
30.0000 mg | Freq: Once | INTRAMUSCULAR | Status: AC
  Administered 2014-01-30: 30 mg via INTRAVENOUS
  Filled 2014-01-30: qty 1

## 2014-02-04 ENCOUNTER — Telehealth: Payer: Self-pay | Admitting: Obstetrics and Gynecology

## 2014-02-04 ENCOUNTER — Encounter: Payer: Self-pay | Admitting: Obstetrics and Gynecology

## 2014-02-04 ENCOUNTER — Other Ambulatory Visit: Payer: BC Managed Care – PPO | Admitting: Obstetrics and Gynecology

## 2014-02-04 ENCOUNTER — Ambulatory Visit (INDEPENDENT_AMBULATORY_CARE_PROVIDER_SITE_OTHER): Payer: BC Managed Care – PPO

## 2014-02-04 ENCOUNTER — Ambulatory Visit (INDEPENDENT_AMBULATORY_CARE_PROVIDER_SITE_OTHER): Payer: BC Managed Care – PPO | Admitting: Obstetrics and Gynecology

## 2014-02-04 ENCOUNTER — Other Ambulatory Visit: Payer: BC Managed Care – PPO

## 2014-02-04 VITALS — BP 100/64 | HR 70 | Ht 65.0 in | Wt 176.0 lb

## 2014-02-04 DIAGNOSIS — D1739 Benign lipomatous neoplasm of skin and subcutaneous tissue of other sites: Secondary | ICD-10-CM

## 2014-02-04 DIAGNOSIS — D219 Benign neoplasm of connective and other soft tissue, unspecified: Secondary | ICD-10-CM

## 2014-02-04 DIAGNOSIS — D259 Leiomyoma of uterus, unspecified: Secondary | ICD-10-CM

## 2014-02-04 DIAGNOSIS — M25559 Pain in unspecified hip: Secondary | ICD-10-CM

## 2014-02-04 DIAGNOSIS — D171 Benign lipomatous neoplasm of skin and subcutaneous tissue of trunk: Secondary | ICD-10-CM

## 2014-02-04 MED ORDER — NORETHINDRONE 0.35 MG PO TABS: 1.0000 | ORAL_TABLET | Freq: Every day | ORAL | Status: DC

## 2014-02-04 NOTE — Progress Notes (Signed)
Subjective  Patient is here for pelvic ultrasound today.   38 y.o.   Married  [place] American  female    [phone] status post BTL, with Patient's last menstrual period was 01/02/2014.    Severe Abd pain/cramping before, during and after cycle for the last 3 months. Having breakthrough bleeding between Dec and Jan cycles. This is the first time ever. States she can also skip menses, but does not go more than 2 months without a cycle.  Feels lower abdominal and lower back cramping and discomfort any time, but especially with menses.  Ibuprofen helps. Clotting increasing.  Pad change three - four times a day.   Feels sleepy and nauseous around cycle time.  Feels nausea when lays on her right side at night.   Took birth control pills in the past but stopped due to elevated blood pressure.  Gained weight and had amenorrhea with Depo Provera.  Did not like it.  Had Norplant in the past as a teen ager.   Seen at the [facility] in 2011 for pain and was diagnosed with a small fibroid by pelvic ultrasound in 2011.  Had a CT in February 2014 for pain and hematuria - 11 cm right abdominal wall lipoma (patient unaware of this) and BTL clips noted to be in a location that did not appear to be usual for tubes.       Family history of hysterectomy.  Mother had a "tumor."  Patient desires hysterectomy and declines future childbearing.   Objective  Abdominal exam - no masses or tenderness noted.   Ultrasound below - 4 fibroids noted:  0.92 - 2.52 cm.  One is pedunculated (17 mm) and one in encroaching on the cavity versus submucosal at the fundus (16 mm).  EMS 10.88 mm.  1.7 cm left CL cyst.  Normal right ovary.  Minimal free fluid.     Assessment  Status post BTL.  Fibroid uterus.  Dysmenorrhea. Pelvic pain.  Abdominal wall lipoma.   Plan  I discussed fibroids and pelvic pain and treatment options including medical and surgical care. We discussed Camilla progesterone only OCP, Depo  Provera, Nexplanon, uterine artery embolization, myomectomy, and hysterectomy with bilateral salpingectomy.   Patient will start Reeves Forth to try to reduce symptoms while she has a work up of her abdominal wall lipoma.  I will refer her to Dr. Michael Boston.  Proper use of the Camilla OCP discussed.   I discussed laparoscopy and possible Da Vinci robot assistance as the route for her hysterectomy, but this will depend on the general surgeon's approach.   I have provided literature today on Camilla OCP, fibroids, hysterectomy, and DaVinci robotic hysterectomy.  Will return for next appointment after consultation with the general surgeon.     25 minutes face to face time of which over 50% was spent in counseling.   After visit summary to patient.

## 2014-02-04 NOTE — Patient Instructions (Signed)
Norethindrone tablets (contraception) What is this medicine? NORETHINDRONE (nor eth IN drone) is an oral contraceptive. The product contains a female hormone known as a progestin. It is used to prevent pregnancy. This medicine may be used for other purposes; ask your health care provider or pharmacist if you have questions. COMMON BRAND NAME(S): Camila, [place] , [place], [place], [place] , [place], Nor-QD, Nora-BE, Ortho [facility]or What should I tell my health care provider before I take this medicine? They need to know if you have any of these conditions: -blood vessel disease or blood clots -breast, cervical, or vaginal cancer -diabetes -heart disease -kidney disease -liver disease -mental depression -migraine -seizures -stroke -vaginal bleeding -an unusual or allergic reaction to norethindrone, other medicines, foods, dyes, or preservatives -pregnant or trying to get pregnant -breast-feeding How should I use this medicine? Take this medicine by mouth with a glass of water. You may take it with or without food. Follow the directions on the prescription label. Take this medicine at the same time each day and in the order directed on the package. Do not take your medicine more often than directed. Contact your pediatrician regarding the use of this medicine in children. Special care may be needed. This medicine has been used in female children who have started having menstrual periods. A patient package insert for the product will be given with each prescription and refill. Read this sheet carefully each time. The sheet may change frequently. Overdosage: If you think you have taken too much of this medicine contact a poison control center or emergency room at once. NOTE: This medicine is only for you. Do not share this medicine with others. What if I miss a dose? Try not to miss a dose. Every time you miss a dose or take a dose late your chance of pregnancy increases. When 1 pill is missed  (even if only 3 hours late), take the missed pill as soon as possible and continue taking a pill each day at the regular time (use a back up method of birth control for the next 48 hours). If more than 1 dose is missed, use an additional birth control method for the rest of your pill pack until menses occurs. Contact your health care professional if more than 1 dose has been missed. What may interact with this medicine? Do not take this medicine with any of the following medications: -amprenavir or fosamprenavir -bosentan This medicine may also interact with the following medications: -antibiotics or medicines for infections, especially rifampin, rifabutin, rifapentine, and griseofulvin, and possibly penicillins or tetracyclines -aprepitant -barbiturate medicines, such as phenobarbital -carbamazepine -felbamate -modafinil -oxcarbazepine -phenytoin -ritonavir or other medicines for HIV infection or AIDS -St. John's wort -topiramate This list may not describe all possible interactions. Give your health care provider a list of all the medicines, herbs, non-prescription drugs, or dietary supplements you use. Also tell them if you smoke, drink alcohol, or use illegal drugs. Some items may interact with your medicine. What should I watch for while using this medicine? Visit your doctor or health care professional for regular checks on your progress. You will need a regular breast and pelvic exam and Pap smear while on this medicine. Use an additional method of birth control during the first cycle that you take these tablets. If you have any reason to think you are pregnant, stop taking this medicine right away and contact your doctor or health care professional. If you are taking this medicine for hormone related problems, it may take several  cycles of use to see improvement in your condition. This medicine does not protect you against HIV infection (AIDS) or any other sexually transmitted  diseases. What side effects may I notice from receiving this medicine? Side effects that you should report to your doctor or health care professional as soon as possible: -breast tenderness or discharge -pain in the abdomen, chest, groin or leg -severe headache -skin rash, itching, or hives -sudden shortness of breath -unusually weak or tired -vision or speech problems -yellowing of skin or eyes Side effects that usually do not require medical attention (report to your doctor or health care professional if they continue or are bothersome): -changes in sexual desire -change in menstrual flow -facial hair growth -fluid retention and swelling -headache -irritability -nausea -weight gain or loss This list may not describe all possible side effects. Call your doctor for medical advice about side effects. You may report side effects to FDA at 1-800-FDA-1088. Where should I keep my medicine? Keep out of the reach of children. Store at room temperature between 15 and 30 degrees C (59 and 86 degrees F). Throw away any unused medicine after the expiration date. NOTE: This sheet is a summary. It may not cover all possible information. If you have questions about this medicine, talk to your doctor, pharmacist, or health care provider.  2014, Elsevier/Gold Standard. (2012-08-22 16:41:35)   You will be contacted about an appointment with the general surgeon, Dr. Michael Boston.

## 2014-02-04 NOTE — Telephone Encounter (Signed)
Message copied by Laqueta Due on Thu Feb 04, 2014  4:07 PM ------      Message from: [place], [place]: Thu Feb 04, 2014 10:17 AM      Regarding: precert surgery       Please precert robotic total laparoscopic hysterectomy with bilateral salpingectomy.            Diagnosis is symptomatic fibroids and pelvic pain.            Please note that the patient is also having a consultation with a general surgeon for potential abdominal wall lipoma removal which may affect both the route of surgery and the timing.             Thanks.            [name] ------

## 2014-02-04 NOTE — Telephone Encounter (Signed)
Telephoned patient, advised that she is scheduled with Dr Johney Maine 03.05.2015 @ 1030.

## 2014-02-08 ENCOUNTER — Telehealth: Payer: Self-pay | Admitting: Obstetrics and Gynecology

## 2014-02-08 NOTE — Telephone Encounter (Signed)
Left message for patient to call back to go over insurance benefits for surgery.

## 2014-02-08 NOTE — Telephone Encounter (Signed)
Telephoned patient. Advised that per plan benefits, she has a $1000 calendar year deductible, plan pays at 80/20. Quoted patient liability for surgery is $1304.99. Patient will think this over and call back.

## 2014-02-08 NOTE — Telephone Encounter (Signed)
Patient is calling sabrina back °

## 2014-02-18 ENCOUNTER — Encounter (INDEPENDENT_AMBULATORY_CARE_PROVIDER_SITE_OTHER): Payer: Self-pay | Admitting: Surgery

## 2014-02-18 ENCOUNTER — Ambulatory Visit (INDEPENDENT_AMBULATORY_CARE_PROVIDER_SITE_OTHER): Payer: BC Managed Care – PPO | Admitting: Surgery

## 2014-02-18 VITALS — BP 122/78 | HR 80 | Temp 97.9°F | Resp 16 | Ht 66.0 in | Wt 175.0 lb

## 2014-02-18 DIAGNOSIS — R1903 Right lower quadrant abdominal swelling, mass and lump: Secondary | ICD-10-CM

## 2014-02-18 DIAGNOSIS — D259 Leiomyoma of uterus, unspecified: Secondary | ICD-10-CM

## 2014-02-18 NOTE — Patient Instructions (Signed)
Please consider the recommendations that we have given you today:  You have a 11 cm fatty mass deep in your right lower abdominal wall next to your muscles.  Most likely consistent with a benign lipoma.  Please obtain a CT scan of the abdomen compared to last years.  If no change in size or consistency, it is a benign lipoma and can be observed only.  If changed or you have symptoms such as right lower abdominal pain or swelling, then consider surgical removal of the fatty mass in the operating room.  At this point,  I think that is unlikely  See the Handout(s) we have given you.  Please call our office at [phone] if you wish to schedule surgery or if you have further questions / concerns.   Lipoma A lipoma is a noncancerous (benign) tumor composed of fat cells. They are usually found under the skin (subcutaneous). A lipoma may occur in any tissue of the body that contains fat. Common areas for lipomas to appear include the back, shoulders, buttocks, and thighs. Lipomas are a very common soft tissue growth. They are soft and grow slowly. Most problems caused by a lipoma depend on where it is growing. DIAGNOSIS  A lipoma can be diagnosed with a physical exam. These tumors rarely become cancerous, but radiographic studies can help determine this for certain. Studies used may include:  Computerized X-ray scans (CT or CAT scan).  Computerized magnetic scans (MRI). TREATMENT  Small lipomas that are not causing problems may be watched. If a lipoma continues to enlarge or causes problems, removal is often the best treatment. Lipomas can also be removed to improve appearance. Surgery is done to remove the fatty cells and the surrounding capsule. Most often, this is done with medicine that numbs the area (local anesthetic). The removed tissue is examined under a microscope to make sure it is not cancerous. Keep all follow-up appointments with your caregiver. SEEK MEDICAL CARE IF:   The lipoma  becomes larger or hard.  The lipoma becomes painful, red, or increasingly swollen. These could be signs of infection or a more serious condition. Document Released: 11/23/2002 Document Revised: 02/25/2012 Document Reviewed: 05/05/2010 [facility] Patient Information 2014 [place], [place].

## 2014-02-19 ENCOUNTER — Telehealth (INDEPENDENT_AMBULATORY_CARE_PROVIDER_SITE_OTHER): Payer: Self-pay | Admitting: *Deleted

## 2014-02-19 NOTE — Progress Notes (Signed)
Subjective:     Patient ID: Mary Robles, female   DOB: 17-Oct-1976, 38 y.o.   MRN: [id number]  HPI  Note: This dictation was prepared with Dragon/digital dictation along with [facility] technology. Any transcriptional errors that result from this process are unintentional.       Mary Robles  1976-06-25 [id number]  Patient Care Team: Susy Frizzle, MD as PCP - General (Family Medicine) Jamey Reas de Berton Lan, MD as Consulting Physician (Obstetrics and Gynecology)  This patient is a 38 y.o.female who presents today for surgical evaluation at the request of Dr. Quincy Simmonds.   Reason for visit: Abdominal wall mass.  Question need for excision  Pleasant female with intermittent episodes of mild abdominal pain and constipation.  Not severe.  Underwent CAT scan 01/2013 &  found to have fatty mass in abdominal wall (subfascial) c/w 11cm flat lipoma.  No one including the patient can  feel a mass.  Does not feel symptoms in the right lower quadrant where it is located.  Apparently she has a fibroid uterus and there is discussion concerning possibility of performing hysterectomy to treat this.  Her gynecologist requested surgical evaluation.  The patient has no history of fall or trauma.  No history of infection, motor vehicle collision.  No history of neurofibromatosis or lipomas elsewhere.  Weight has been stable.  No fevers or chills.  No changes in energy level.  No night sweats.  No personal nor family history of GI/colon cancer, inflammatory bowel disease, irritable bowel syndrome, allergy such as Celiac Sprue, dietary/dairy problems, colitis, ulcers nor gastritis.  No recent sick contacts/gastroenteritis.  No travel outside the country.  No changes in diet.  No dysphagia to solids or liquids.  No significant heartburn or reflux.  No hematochezia, hematemesis, coffee ground emesis.  No evidence of prior gastric/peptic ulceration.    Patient Active Problem List   Diagnosis  Date Noted  . Abdominal wall mass of right lower quadrant, 11cm, probable lipoma 02/18/2014  . Fibroid uterus 02/18/2014  . Hypertension   . Vitamin D deficiency     Past Medical History  Diagnosis Date  . Herpes simplex   . Sickle cell trait   . Asthma   . Anxiety   . Hypertension   . Hyperlysinemia   . Vitamin D deficiency   . Depression     Past Surgical History  Procedure Laterality Date  . Tubal ligation    . Foot surgery      History   Social History  . Marital Status: Married    Spouse Name: N/A    Number of Children: N/A  . Years of Education: N/A   Occupational History  . Not on file.   Social History Main Topics  . Smoking status: Never Smoker   . Smokeless tobacco: Never Used  . Alcohol Use: 0.5 oz/week    1 drink(s) per week     Comment: occ glass of wine  . Drug Use: No  . Sexual Activity: Yes    Partners: Male    Birth Control/ Protection: Surgical     Comment: BTSP 2005   Other Topics Concern  . Not on file   Social History Narrative  . No narrative on file    Family History  Problem Relation Age of Onset  . Diabetes Mother   . Thyroid disease Mother   . Mental retardation Maternal Aunt   . Diabetes Maternal Grandmother     Current Outpatient Prescriptions  Medication  Sig Dispense Refill  . amLODipine (NORVASC) 5 MG tablet TAKE ONE TABLET BY MOUTH ONCE DAILY  30 tablet  5  . benazepril (LOTENSIN) 20 MG tablet Take 1 tablet (20 mg total) by mouth daily.  30 tablet  4  . fluticasone (FLONASE) 50 MCG/ACT nasal spray Place 2 sprays into the nose daily.  16 g  6  . pravastatin (PRAVACHOL) 40 MG tablet TAKE ONE TABLET BY MOUTH ONCE DAILY  30 tablet  5  . cloNIDine (CATAPRES) 0.1 MG tablet Take 0.5 tablets (0.05 mg total) by mouth 3 (three) times daily.  60 tablet  4   No current facility-administered medications for this visit.     Allergies  Allergen Reactions  . Betadine [Povidone Iodine]     Reaction:Blisters    BP 122/78   Pulse 80  Temp(Src) 97.9 F (36.6 C) (Oral)  Resp 16  Ht 5\' 6"  (1.676 m)  Wt 175 lb (79.379 kg)  BMI 28.26 kg/m2  LMP 01/02/2014  Dg Chest 2 View  01/30/2014   CLINICAL DATA:  Numbness.  EXAM: CHEST  2 VIEW  COMPARISON:  07/01/2013  FINDINGS: The cardiomediastinal silhouette is within normal limits. The lungs are well inflated and clear. There is no evidence of pleural effusion or pneumothorax. No acute osseous abnormality is identified.  IMPRESSION: No active cardiopulmonary disease.   Electronically Signed   By: Logan Bores   On: 01/30/2014 00:18   Ct Head Wo Contrast  01/29/2014   CLINICAL DATA:  Numbness  EXAM: CT HEAD WITHOUT CONTRAST  TECHNIQUE: Contiguous axial images were obtained from the base of the skull through the vertex without intravenous contrast.  COMPARISON:  None.  FINDINGS: There is no acute intracranial hemorrhage or infarct. No mass lesion or midline shift. Gray-white matter differentiation is well maintained. Ventricles are normal in size without evidence of hydrocephalus. CSF containing spaces are within normal limits. No extra-axial fluid collection.  The calvarium is intact.  Orbital soft tissues are within normal limits.  The paranasal sinuses and mastoid air cells are well pneumatized and free of fluid.  Scalp soft tissues are unremarkable.  IMPRESSION: No acute intracranial process.   Electronically Signed   By: Jeannine Boga M.D.   On: 01/29/2014 23:59   US Transvaginal Non-ob  02/04/2014   SEE PROGRESS NOTE  *RADIOLOGY REPORT*  01/28/2013 Clinical Data: Tenderness and hematuria  CT ABDOMEN AND PELVIS WITHOUT CONTRAST  Technique: Multidetector CT imaging of the abdomen and pelvis was  performed following the standard protocol without intravenous  contrast.  Comparison: None  Findings: Lung bases are clear. No pericardial or pleural  effusion.  No focal liver abnormality identified. Gallbladder normal. No  biliary dilatation. The pancreas is  unremarkable. The spleen is  negative.  Both adrenal glands are within normal limits. The left kidney is  normal. The right kidney is normal. Urinary bladder is  unremarkable. The uterus and the adnexal structures have a normal  configuration. Tubal ligation clips are noted posterior to the  uterus and do not appear to be in the expected location of the  fallopian tubes.  No free fluid or fluid collections within the abdomen or pelvis.  The abdominal aorta has a normal caliber. No evidence for  aneurysm.  There are no enlarged upper abdominal lymph nodes. There are no  enlarged pelvic or inguinal lymph nodes identified.  The patient has a small hiatal hernia. The small bowel loops are  unremarkable. Normal appearance of the colon  Within the right lower quadrant ventral abdominal wall musculature  there is a lipoma. This measures 11.3 cm in length and has a  thickness of 2.6 cm and with of 8.6 cm. No complicating features  are associated with this lipoma. Review of the visualized osseous  structures is unremarkable.  IMPRESSION:  1. No acute findings.  2. Right lower quadrant ventral abdominal wall lipoma.  3. Indeterminant location of tubal ligation clips within the  posterior pelvis.  Original Report Authenticated By: Kerby Moors, M.D.    Review of Systems  Constitutional: Negative for fever, chills, diaphoresis, appetite change and fatigue.  HENT: Negative for ear discharge, ear pain, sore throat and trouble swallowing.   Eyes: Negative for photophobia, discharge and visual disturbance.  Respiratory: Negative for cough, choking, chest tightness and shortness of breath.   Cardiovascular: Negative for chest pain and palpitations.  Gastrointestinal: Positive for constipation. Negative for nausea, vomiting, abdominal pain, diarrhea, blood in stool, abdominal distention, anal bleeding and rectal pain.  Endocrine: Negative for cold intolerance and heat intolerance.  Genitourinary:  Negative for dysuria, frequency and difficulty urinating.  Musculoskeletal: Negative for gait problem, myalgias and neck pain.  Skin: Negative for color change, pallor and rash.  Allergic/Immunologic: Negative for environmental allergies, food allergies and immunocompromised state.  Neurological: Negative for dizziness, speech difficulty, weakness and numbness.  Hematological: Negative for adenopathy.  Psychiatric/Behavioral: Negative for confusion and agitation. The patient is not nervous/anxious.        Objective:   Physical Exam  Constitutional: She is oriented to person, place, and time. She appears well-developed and well-nourished. No distress.  HENT:  Head: Normocephalic.  Mouth/Throat: Oropharynx is clear and moist. No oropharyngeal exudate.  Eyes: Conjunctivae and EOM are normal. Pupils are equal, round, and reactive to light. No scleral icterus.  Neck: Normal range of motion. Neck supple. No tracheal deviation present.  Cardiovascular: Normal rate, regular rhythm and intact distal pulses.   Pulmonary/Chest: Effort normal and breath sounds normal. No stridor. No respiratory distress. She exhibits no tenderness.  Abdominal: Soft. She exhibits no distension and no mass. There is no tenderness. No hernia. Hernia confirmed negative in the ventral area, confirmed negative in the right inguinal area and confirmed negative in the left inguinal area.  Abdomen obese but soft.  No mass felt.  No obvious asymmetry  Genitourinary: No vaginal discharge found.  Musculoskeletal: Normal range of motion. She exhibits no tenderness.       Right elbow: She exhibits normal range of motion.       Left elbow: She exhibits normal range of motion.       Right wrist: She exhibits normal range of motion.       Left wrist: She exhibits normal range of motion.       Right hand: Normal strength noted.       Left hand: Normal strength noted.  Lymphadenopathy:       Head (right side): No posterior auricular  adenopathy present.       Head (left side): No posterior auricular adenopathy present.    She has no cervical adenopathy.    She has no axillary adenopathy.       Right: No inguinal adenopathy present.       Left: No inguinal adenopathy present.  Neurological: She is alert and oriented to person, place, and time. No cranial nerve deficit. She exhibits normal muscle tone. Coordination normal.  Skin: Skin is warm and dry. No rash noted. She is not diaphoretic.  No erythema.  Psychiatric: She has a normal mood and affect. Her behavior is normal. Judgment and thought content normal.       Assessment:     Subfascial abdominal wall mass of right lower quadrant most likely consistent with lipoma.  11 cm a year ago.  Completely asymptomatic.     Plan:     At this point, I do not think she requires aggressive resection.  Discuss with numerous partners.  Since it has been over a year since the last CT scan, I think it is reasonable to perform CT scan to see if the mass changed in size.  It the mass has changed in characteristics are enlarged or symptomatic, then I would recommend excision.    The pathophysiology of skin & subcutaneous masses was discussed.  Natural history risks without surgery were discussed.  I recommended surgery to remove the mass.  I explained the technique of removal with use of anesthesia for patient comfort.   Risks such as bleeding, infection, heart attack, death, and other risks were discussed.  I noted a good likelihood this will help address the problem.   Possibility that this will not correct all symptoms was explained. Possibility of regrowth/recurrence of the mass was discussed.  We will work to minimize complications. Questions were answered.    If it is stable in size it does not bother her, I do not feel strongly it needs to be removed.   If she is undergoing hysterectomy through an abdominal incision, I could do excision at the same time if the patient and Dr.  Quincy Simmonds wish.  The patient did not know if she wanted a hysterectomy at this time.  I will wait for feedback from the patient and/or Dr Quincy Simmonds.  I gave the patient my card.  Obtain a CT scan first and go from there

## 2014-02-19 NOTE — Telephone Encounter (Signed)
I spoke with pt and informed her of the appt for her CT scan at [facility] on 02/23/14 with an arrival time of 10:20am.  I instructed her on when to drink her contrast and to have NO solid foods 4 hours prior to the scan.  She is agreeable with this appt and instructions given.

## 2014-02-23 ENCOUNTER — Telehealth: Payer: Self-pay | Admitting: Obstetrics and Gynecology

## 2014-02-23 ENCOUNTER — Ambulatory Visit
Admission: RE | Admit: 2014-02-23 | Discharge: 2014-02-23 | Disposition: A | Discharge status: Home / Self Care | Payer: BC Managed Care – PPO | Source: Ambulatory Visit | Attending: Surgery | Admitting: Surgery

## 2014-02-23 DIAGNOSIS — N926 Irregular menstruation, unspecified: Secondary | ICD-10-CM

## 2014-02-23 DIAGNOSIS — R1903 Right lower quadrant abdominal swelling, mass and lump: Secondary | ICD-10-CM

## 2014-02-23 MED ORDER — IOHEXOL 300 MG/ML  SOLN
100.0000 mL | Freq: Once | INTRAMUSCULAR | Status: AC | PRN
  Administered 2014-02-23: 100 mL via INTRAVENOUS

## 2014-02-23 NOTE — Telephone Encounter (Signed)
Patient is calling Mary Robles back in regards to surgery payment   Mary Robles at 02/08/2014  4:21 PM      Status: Signed            Telephoned patient. Advised that per plan benefits, she has a $1000 calendar year deductible, plan pays at 80/20. Quoted patient liability for surgery is $1304.99. Patient will think this over and call back.         Mary Robles at 02/08/2014  4:02 PM      Status: Signed            Patient is calling Mary Robles back         Mary Robles at 02/08/2014  3:57 PM      Status: Signed            Left message for patient to call back to go over insurance benefits for surgery.

## 2014-02-23 NOTE — Telephone Encounter (Signed)
Spoke with patient. Reiterated that she has $1000 ded and per quote received from [place] 0 has been applied to the deductible. Explained that once her deductible is met, plan will pay 80% and she will be liable for 20%.  Total patient liability for surgeon charges is [phone]. Patient states that she has $304.99, but not $1000 additional. Advised that payment is due in full 2 weeks prior to scheduled surgery date. Patient states that she will see what she can do.

## 2014-02-23 NOTE — Telephone Encounter (Signed)
Mary Robles,  The patient had a CT scan today, so I wonder how that will change her deductible situation. Please check and see if this will make a difference.

## 2014-02-24 ENCOUNTER — Telehealth: Payer: Self-pay | Admitting: *Deleted

## 2014-02-24 NOTE — Telephone Encounter (Signed)
Phone call to patient regarding CT scan.  Patient had break through bleeding just once ever. Has skipped cycles in January or February.  Wants to proceed with planning and surgery. States she is tired of feeling in pain and uncomfortable.  Wants the lipoma removed. States she feels short of breath when she walks.  Will have the patient return for an EMB to complete her preop workup.  Will contact Dr. Johney Maine to plan surgery for lipoma removal and hysterectomy.

## 2014-02-24 NOTE — Telephone Encounter (Signed)
See previous phone message from Dr Quincy Simmonds regarding combined surgery with Dr Johney Maine.  Call to [facility], LM on Debbi Everhardt VM, calling to check on scheduling options.

## 2014-03-01 ENCOUNTER — Other Ambulatory Visit (INDEPENDENT_AMBULATORY_CARE_PROVIDER_SITE_OTHER): Payer: Self-pay | Admitting: Surgery

## 2014-03-01 ENCOUNTER — Telehealth (INDEPENDENT_AMBULATORY_CARE_PROVIDER_SITE_OTHER): Payer: Self-pay

## 2014-03-01 NOTE — Telephone Encounter (Signed)
Called pt to let her know that Dr Johney Maine reviewed the CT scan results which does show a preperitoneal mass that is larger and should be removed by surgery. Dr Johney Maine did speak with Dr Quincy Simmonds today about coordinating this case together with the hysterectomy the pt is planning on having at [facility]. I advised pt that we turned our surgical orders into scheduling today to coordinate with Dr Quincy Simmonds. The pt understands.

## 2014-03-01 NOTE — Telephone Encounter (Signed)
Mary Robles called back during lunch for Mary Robles said to call back after 2

## 2014-03-01 NOTE — Telephone Encounter (Signed)
Dr Quincy Simmonds spoke directly with Dr Johney [place] at [facility].  Both cases will be done thru same abdominal incision.  Change our case to TAH, Bilateral salpingectomy, possible BSO.

## 2014-03-02 ENCOUNTER — Encounter: Payer: Self-pay | Admitting: Obstetrics and Gynecology

## 2014-03-04 ENCOUNTER — Telehealth: Payer: Self-pay | Admitting: Emergency Medicine

## 2014-03-04 NOTE — Telephone Encounter (Signed)
Call to patient and reviewed surgery date. Surgical instructions reviewed and will mail written form.  Also reviewed Dr Elza Rafter recommendation for endometrial biopsy.  Scheduled endo bx for 04-02-14 so results will be back by preop on 04-14-14. (notified of $25 copay per [place]).   Patient needs FMLA forms completed and will fax these to office, fee paid over phone.  She is aware of 6 weeks out of work and brief description of post op restrictions given.  Patient is also notified Debbi from [place] will call her.   Anything else needed for this case?

## 2014-03-04 NOTE — Telephone Encounter (Signed)
Spoke with Debbi at Dr Johney Maine' office, 05-04-14 first available date for both MD's. Surgery scheduled for 0730 at [facility]. Dr Johney Maine to begin at 0915.

## 2014-03-04 NOTE — Telephone Encounter (Signed)
I have received this update.

## 2014-03-04 NOTE — Telephone Encounter (Signed)
Patient is calling Tracy back °

## 2014-03-04 NOTE — Telephone Encounter (Signed)
Message copied by Bookert Guzzi, Trellis Paganini on Thu Mar 04, 2014  8:45 AM ------      Message from: [place], [place]: Wed Mar 03, 2014  6:11 PM      Regarding: RE: Endo bx       I do recommend the biopsy to complete the patient's work up.            Please contact her to schedule this.            I appreciate you following through about this.             [name]            ----- Message -----         From: Karen Chafe, RN         Sent: 03/03/2014   5:42 PM           To: Brook E Amundson de Berton Lan, MD      Subject: Endo bx                                                  Hi Dr. Quincy Simmonds,             Gabriel Cirri has precerted this patient for an endometrial bx, will she still need this order?             ------

## 2014-03-04 NOTE — Telephone Encounter (Signed)
See next telephone note for surgery information. Encounter closed.

## 2014-03-04 NOTE — Telephone Encounter (Signed)
Called and left message for pt to return call to office to pay for her fmla paperwork per her phone call with sally earlier.

## 2014-03-04 NOTE — Telephone Encounter (Signed)
Message left to return call to Jaedynn Bohlken at [phone].    

## 2014-03-04 NOTE — Telephone Encounter (Signed)
Gay Filler can assist patient with this since working on surgery

## 2014-03-04 NOTE — Telephone Encounter (Signed)
See next phone note regarding surgery scheduling. Encounter closed.

## 2014-03-05 NOTE — Telephone Encounter (Signed)
Encounter closed.  Will await call from patient and FMLA forms to be received.

## 2014-03-15 ENCOUNTER — Other Ambulatory Visit: Payer: Self-pay | Admitting: Physician Assistant

## 2014-03-16 ENCOUNTER — Telehealth: Payer: Self-pay | Admitting: Obstetrics and Gynecology

## 2014-03-16 NOTE — Telephone Encounter (Signed)
Patient called. We discussed out of pocket responsibility for surgery ([id number]) of $720.70. Patient paid surgeons charges over the phone. Mailed receipt

## 2014-03-25 ENCOUNTER — Telehealth: Payer: Self-pay | Admitting: Obstetrics and Gynecology

## 2014-03-25 NOTE — Telephone Encounter (Signed)
Returning a call to Sally. °

## 2014-03-25 NOTE — Telephone Encounter (Signed)
FMLA forms completed and to sent to Dr Quincy Simmonds for review and sign off. Call to update patient, LMTCB.

## 2014-03-25 NOTE — Telephone Encounter (Signed)
Patient notified that forms will be faxed after MD reviews.  Routing to provider for final review. Patient agreeable to disposition. Will close encounter

## 2014-03-25 NOTE — Telephone Encounter (Signed)
Patient is calling to check on FMLA forms

## 2014-03-29 ENCOUNTER — Telehealth: Payer: Self-pay | Admitting: *Deleted

## 2014-03-29 NOTE — Telephone Encounter (Signed)
Call to patient to confirm fax number for FMLA forms as we keep getting failure message. Fax number should be [phone] Was previously being faxed to [phone].  Never went through.  Forms faxed to correct number.  Routing to provider for final review. Patient agreeable to disposition. Will close encounter

## 2014-04-02 ENCOUNTER — Encounter: Payer: Self-pay | Admitting: Obstetrics and Gynecology

## 2014-04-02 ENCOUNTER — Ambulatory Visit (INDEPENDENT_AMBULATORY_CARE_PROVIDER_SITE_OTHER): Payer: BC Managed Care – PPO | Admitting: Obstetrics and Gynecology

## 2014-04-02 ENCOUNTER — Telehealth: Payer: Self-pay | Admitting: Obstetrics and Gynecology

## 2014-04-02 VITALS — BP 122/78 | HR 76 | Ht 65.0 in | Wt 180.4 lb

## 2014-04-02 DIAGNOSIS — N926 Irregular menstruation, unspecified: Secondary | ICD-10-CM

## 2014-04-02 DIAGNOSIS — R9389 Abnormal findings on diagnostic imaging of other specified body structures: Secondary | ICD-10-CM

## 2014-04-02 NOTE — Patient Instructions (Signed)
Endometrial Biopsy, Care After Refer to this sheet in the next few weeks. These instructions provide you with information on caring for yourself after your procedure. Your health care provider may also give you more specific instructions. Your treatment has been planned according to current medical practices, but problems sometimes occur. Call your health care provider if you have any problems or questions after your procedure. WHAT TO EXPECT AFTER THE PROCEDURE After your procedure, it is typical to have the following:  You may have mild cramping and a small amount of vaginal bleeding for a few days after the procedure. This is normal. HOME CARE INSTRUCTIONS  Only take over-the-counter or prescription medicine as directed by your health care provider.  Do not douche, use tampons, or have sexual intercourse until your health care provider approves.  Follow your health care provider's instructions regarding any activity restrictions, such as strenuous exercise or heavy lifting. SEEK MEDICAL CARE IF:  You have heavy bleeding or bleeding longer than 2 days after the procedure.  You have bad smelling drainage from your vagina.  You have a fever and chills.  Youhave severe lower stomach (abdominal) pain. SEEK IMMEDIATE MEDICAL CARE IF:  You have severe cramps in your stomach or back.  You Kohls large blood clots.  Your bleeding increases.  You become weak or lightheaded, or you [place] out. Document Released: 09/23/2013 Document Reviewed: 05/20/2013 ExitCare Patient Information 2014 [employer].  

## 2014-04-02 NOTE — Telephone Encounter (Signed)
Patient dropped off a form she forgot to include in her FMLA paperwork that was already completed. She requests it be completed by the end of April and faxed to her at work at: [employer]. Form attached to patient's chart for visit 04/02/14 with Dr. Jodean Lima has a note on it.  Cc: Gay Filler

## 2014-04-02 NOTE — Progress Notes (Signed)
Patient ID: Mary Robles, female   DOB: [birth year], 38 y.o.   MRN: [id number] GYNECOLOGY  VISIT   HPI: 38 y.o.   Married  [place] American  female   [phone] with Patient's last menstrual period was 03/08/2014.   here for   Endometrial biopsy.  Patient with clotting with menses and pelvic pain and dysmenorrhea. Some recent intermenstrual bleeding.  Ultrasound on 02/04/14 showed multiple fibroids.   EMS 10.88 mm.  Status post BTL. Declines future childbearing.   Patient is scheduled for a total abdominal hysterectomy with bilateral salpingectomy by me and a concurrent excision of large abdominal wall lipoma by Dr. Michael Boston   History of cryotherapy of the cervix.   GYNECOLOGIC HISTORY: Patient's last menstrual period was 03/08/2014. Contraception: Tubal ligation  Menopausal hormone therapy: n/a        OB History   Grav Para Term Preterm Abortions TAB SAB Ect Mult Living   4 2   1 1    2          Patient Active Problem List   Diagnosis Date Noted  . Abdominal wall mass of right lower quadrant, 11cm, probable lipoma 02/18/2014  . Fibroid uterus 02/18/2014  . Hypertension   . Vitamin D deficiency     Past Medical History  Diagnosis Date  . Herpes simplex   . Sickle cell trait   . Asthma   . Anxiety   . Hypertension   . Hyperlysinemia   . Vitamin D deficiency   . Depression     Past Surgical History  Procedure Laterality Date  . Tubal ligation    . Foot surgery      Current Outpatient Prescriptions  Medication Sig Dispense Refill  . amLODipine (NORVASC) 5 MG tablet TAKE ONE TABLET BY MOUTH ONCE DAILY  30 tablet  5  . benazepril (LOTENSIN) 20 MG tablet TAKE ONE TABLET BY MOUTH ONCE DAILY  30 tablet  10  . cloNIDine (CATAPRES) 0.1 MG tablet TAKE ONE-HALF TABLET BY MOUTH THREE TIMES DAILY  60 tablet  10  . fluticasone (FLONASE) 50 MCG/ACT nasal spray Place 2 sprays into the nose daily.  16 g  6  . pravastatin (PRAVACHOL) 40 MG tablet TAKE ONE TABLET BY MOUTH  ONCE DAILY  30 tablet  5   No current facility-administered medications for this visit.     ALLERGIES: Betadine  Family History  Problem Relation Age of Onset  . Diabetes Mother   . Thyroid disease Mother   . Mental retardation Maternal Aunt   . Diabetes Maternal Grandmother     History   Social History  . Marital Status: Married    Spouse Name: N/A    Number of Children: N/A  . Years of Education: N/A   Occupational History  . Not on file.   Social History Main Topics  . Smoking status: Never Smoker   . Smokeless tobacco: Never Used  . Alcohol Use: 0.5 oz/week    1 drink(s) per week     Comment: occ glass of wine  . Drug Use: No  . Sexual Activity: Yes    Partners: Male    Birth Control/ Protection: Surgical     Comment: BTSP 2005   Other Topics Concern  . Not on file   Social History Narrative  . No narrative on file    ROS:  Pertinent items are noted in HPI.  PHYSICAL EXAMINATION:    BP 122/78  Pulse 76  Ht 5\' 5"  (1.651 m)  Wt 180 lb 6.4 oz (81.829 kg)  BMI 30.02 kg/m2  LMP 03/08/2014     General appearance: alert, cooperative and appears stated age  Pelvic: External genitalia:  no lesions              Urethra:  normal appearing urethra with no masses, tenderness or lesions              Bartholins and Skenes: normal                 Vagina: normal appearing vagina with normal color and discharge, no lesions              Cervix: normal appearance            Endometrial Biopsy  Consent for procedure. Speculum placed in vagina. Sterile prep of cervix with Hibiclens Pipelle place to 8.5 cm without difficulty twice. Tissue to pathology. No complications.  Minimal EBL.  ASSESSMENT  Menorrhagia. Fibroids. Abdominal wall lipoma   PLAN  Follow up EMB results. Proceed with TAH/bilateral salpingectomy by me and concurrent abdominal wall lipoma removal by Dr. Michael Boston.    Patient will return for preop visit at surgery is scheduled for  over 30 days from this visit.  Counseled on  Return    An After Visit Summary was printed and given to the patient.  _15_____ minutes face to face time of which over 50% was spent in counseling.

## 2014-04-07 LAB — IPS OTHER TISSUE BIOPSY

## 2014-04-09 ENCOUNTER — Telehealth: Payer: Self-pay

## 2014-04-09 NOTE — Telephone Encounter (Signed)
Form sent to Dr Quincy Simmonds for signature.  Routing to provider for final review. Patient agreeable to disposition. Will close encounter

## 2014-04-09 NOTE — Telephone Encounter (Signed)
Called pt. LMOVM to call for results of endometrial bx.

## 2014-04-09 NOTE — Telephone Encounter (Signed)
Message copied by Lowella Fairy on Fri Apr 09, 2014  4:15 PM ------      Message from: [place]: Wed Apr 07, 2014 11:03 AM       Please inform patient of negative endometrial biopsy.       This was performed as part of her work up for surgery.       We may still proceed as planned with her abdominal hysterectomy. ------

## 2014-04-14 ENCOUNTER — Ambulatory Visit (INDEPENDENT_AMBULATORY_CARE_PROVIDER_SITE_OTHER): Payer: BC Managed Care – PPO | Admitting: Obstetrics and Gynecology

## 2014-04-14 ENCOUNTER — Encounter: Payer: Self-pay | Admitting: Obstetrics and Gynecology

## 2014-04-14 VITALS — BP 114/70 | HR 76 | Ht 65.0 in | Wt 179.0 lb

## 2014-04-14 DIAGNOSIS — D259 Leiomyoma of uterus, unspecified: Secondary | ICD-10-CM

## 2014-04-14 DIAGNOSIS — B009 Herpesviral infection, unspecified: Secondary | ICD-10-CM

## 2014-04-14 DIAGNOSIS — D219 Benign neoplasm of connective and other soft tissue, unspecified: Secondary | ICD-10-CM

## 2014-04-14 MED ORDER — FAMCICLOVIR 250 MG PO TABS
ORAL_TABLET | ORAL | Status: DC
Start: 2014-04-14 — End: 2014-04-22

## 2014-04-14 MED ORDER — FAMCICLOVIR 250 MG PO TABS
250.0000 mg | ORAL_TABLET | Freq: Two times a day (BID) | ORAL | Status: DC
Start: 2014-04-14 — End: 2014-04-14

## 2014-04-14 NOTE — Telephone Encounter (Signed)
Patient notified of results this morning.  See result note.

## 2014-04-14 NOTE — Progress Notes (Signed)
Patient ID: Mary Robles, female   DOB: 1975/12/29, 38 y.o.   MRN: [id number] GYNECOLOGY  VISIT   HPI: 38 y.o.   Married  [place] American  female   [phone] with Patient's last menstrual period was 03/08/2014.  Status post BTL. here to discuss surgery.  Husband is present for entire visit today.  Skips menses usually in January or February.  Wants to proceed with hysterectomy and removal of fallopian tubes along with removal of abdominal wall lipoma. Declines future childbearing.   Patient has heavy menses with clotting and pelvic pain before, during, and after menses.  Pain occurs especially during her menses.  Pelvic ultrasound on 02/04/14 showed 4 fibroids: 0.92 - 2.52 cm. One is pedunculated (17 mm) and one in encroaching on the cavity versus submucosal at the fundus (16 mm). EMS 10.88 mm. 1.7 cm left CL cyst. Normal right ovary. Minimal free fluid.  Endometrial biopsy 04/02/14 showed benign weakly proliferative endometrium.  Patient is status post BTL. Oral contraceptives caused increased blood pressure. Depo Provera caused weight gain in past.  Used Norplant as a teen ager.   Patient has a large abdominal wall lipoma 11 cm noted on CT scan of abdomen and pelvis ordered in 2011 for pain. Patient has seen Dr. Michael Boston for consultation and is planning for excision at the time of hysterectomy.   GYNECOLOGIC HISTORY: Patient's last menstrual period was 03/08/2014. Contraception: Tubal   Menopausal hormone therapy: n/a Pap:  No intraepithelial lesion or malignancy, negative HR HPV on 01/21/14.        OB History   Grav Para Term Preterm Abortions TAB SAB Ect Mult Living   4 2   1 1    2          Patient Active Problem List   Diagnosis Date Noted  . Abdominal wall mass of right lower quadrant, 11cm, probable lipoma 02/18/2014  . Fibroid uterus 02/18/2014  . Hypertension   . Vitamin D deficiency     Past Medical History  Diagnosis Date  . Herpes simplex   . Sickle cell  trait   . Asthma   . Anxiety   . Hypertension   . Hyperlysinemia   . Vitamin D deficiency   . Depression     Past Surgical History  Procedure Laterality Date  . Tubal ligation    . Foot surgery      Current Outpatient Prescriptions  Medication Sig Dispense Refill  . amLODipine (NORVASC) 5 MG tablet TAKE ONE TABLET BY MOUTH ONCE DAILY  30 tablet  5  . benazepril (LOTENSIN) 20 MG tablet TAKE ONE TABLET BY MOUTH ONCE DAILY  30 tablet  10  . cloNIDine (CATAPRES) 0.1 MG tablet TAKE ONE-HALF TABLET BY MOUTH THREE TIMES DAILY  60 tablet  10  . fluticasone (FLONASE) 50 MCG/ACT nasal spray Place 2 sprays into the nose daily.  16 g  6  . pravastatin (PRAVACHOL) 40 MG tablet TAKE ONE TABLET BY MOUTH ONCE DAILY  30 tablet  5   No current facility-administered medications for this visit.     ALLERGIES: Betadine  Family History  Problem Relation Age of Onset  . Diabetes Mother   . Thyroid disease Mother   . Mental retardation Maternal Aunt   . Diabetes Maternal Grandmother     History   Social History  . Marital Status: Married    Spouse Name: N/A    Number of Children: N/A  . Years of Education: N/A   Occupational History  .  Not on file.   Social History Main Topics  . Smoking status: Never Smoker   . Smokeless tobacco: Never Used  . Alcohol Use: 0.5 oz/week    1 drink(s) per week     Comment: occ glass of wine  . Drug Use: No  . Sexual Activity: Yes    Partners: Male    Birth Control/ Protection: Surgical     Comment: BTSP 2005   Other Topics Concern  . Not on file   Social History Narrative  . No narrative on file    ROS:  Pertinent items are noted in HPI.  PHYSICAL EXAMINATION:    BP 114/70  Pulse 76  Ht 5\' 5"  (1.651 m)  Wt 179 lb (81.194 kg)  BMI 29.79 kg/m2  LMP 03/08/2014     General appearance: alert, cooperative and appears stated age Lungs: clear to auscultation bilaterally Heart: regular rate and rhythm Abdomen: umbilical incision, soft,  non-tender; no masses,  no organomegaly No abnormal inguinal nodes palpated  Pelvic: External genitalia:  Left labia with 1 cm - 5 mm area of induration and erythema.              Urethra:  normal appearing urethra with no masses, tenderness or lesions              Bartholins and Skenes: normal                 Vagina: normal appearing vagina with normal color and discharge, no lesions              Cervix: normal appearance                   Bimanual Exam:  Uterus:  uterus is normal size, shape, consistency and nontender                                      Adnexa: normal adnexa in size, nontender and no masses                                      Rectovaginal: Confirms                                      Anus:  normal sphincter tone, no lesions  ASSESSMENT  Status post BTL.  Symptomatic uterine fibroids.  Abdominal wall lipoma. HSV outbreak.   PLAN  Proceed with total abdominal hysterectomy with bilateral salpingectomy by me and concurrent excision of abdominal wall lipoma by Dr. Michael Boston.  I have reviewed hysterectomy procedure, risks and benefits of procedure, and surgical recovery expectations.  Risks of surgery include but are not limited to bleeding, infection, damage to surrounding organs, DVT, PE, death, pneumonia, reaction to anesthesia, hernia formation of the incisions, and need for reoperation.    The patient wishes to proceed.    Rx for Famvir.  See Epic.   An After Visit Summary was printed and given to the patient.  __39____ minutes face to face time of which over 50% was spent in counseling.

## 2014-04-14 NOTE — Patient Instructions (Signed)
I will see you the morning of your surgery.   Your Famvir will be ready at the pharmacy this evening.  Get started tonight if possible.

## 2014-04-22 ENCOUNTER — Encounter (HOSPITAL_COMMUNITY): Payer: Self-pay

## 2014-04-30 ENCOUNTER — Encounter (HOSPITAL_COMMUNITY)
Admission: RE | Admit: 2014-04-30 | Discharge: 2014-04-30 | Disposition: A | Discharge status: Home / Self Care | Payer: BC Managed Care – PPO | Source: Ambulatory Visit | Attending: Obstetrics and Gynecology | Admitting: Obstetrics and Gynecology

## 2014-04-30 ENCOUNTER — Encounter (HOSPITAL_COMMUNITY): Payer: Self-pay

## 2014-04-30 DIAGNOSIS — Z01812 Encounter for preprocedural laboratory examination: Secondary | ICD-10-CM | POA: Insufficient documentation

## 2014-04-30 LAB — CBC
HCT: 37.8 % (ref 36.0–46.0)
HEMOGLOBIN: 13.5 g/dL (ref 12.0–15.0)
MCH: 28.2 pg (ref 26.0–34.0)
MCHC: 35.7 g/dL (ref 30.0–36.0)
MCV: 78.9 fL (ref 78.0–100.0)
Platelets: 293 10*3/uL (ref 150–400)
RBC: 4.79 MIL/uL (ref 3.87–5.11)
RDW: 14.7 % (ref 11.5–15.5)
WBC: 5.8 10*3/uL (ref 4.0–10.5)

## 2014-04-30 LAB — BASIC METABOLIC PANEL
BUN: 7 mg/dL (ref 6–23)
CHLORIDE: 102 meq/L (ref 96–112)
CO2: 27 meq/L (ref 19–32)
Calcium: 9.4 mg/dL (ref 8.4–10.5)
Creatinine, Ser: 0.8 mg/dL (ref 0.50–1.10)
GFR calc Af Amer: >90 mL/min (ref >90)
GFR calc non Af Amer: >90 mL/min (ref >90)
GLUCOSE: 83 mg/dL (ref 70–99)
POTASSIUM: 4.7 meq/L (ref 3.7–5.3)
SODIUM: 139 meq/L (ref 137–147)

## 2014-04-30 MED ORDER — CHLORHEXIDINE GLUCONATE 4 % EX LIQD
1.0000 "application " | Freq: Once | CUTANEOUS | Status: DC
  Filled 2014-04-30: qty 15

## 2014-04-30 NOTE — Patient Instructions (Signed)
Okay  04/30/2014   Your procedure is scheduled on:  05/04/14  Enter through the Main Entrance of [facility] at [place] up the phone at the desk and dial [phone].   Call this number if you have problems the morning of surgery: [phone]   Remember:   Do not eat food:After Midnight.  Do not drink clear liquids: After Midnight.  Take these medicines the morning of surgery with A SIP OF WATER: Clonodine   Do not wear jewelry, make-up or nail polish.  Do not wear lotions, powders, or perfumes. You may wear deodorant.  Do not shave 48 hours prior to surgery.  Do not bring valuables to the hospital.  [facility] is not   responsible for any belongings or valuables brought to the hospital.  Contacts, dentures or bridgework may not be worn into surgery.  Leave suitcase in the car. After surgery it may be brought to your room.  For patients admitted to the hospital, checkout time is 11:00 AM the day of              discharge.   Patients discharged the day of surgery will not be allowed to drive             home.  Name and phone number of your driver: NA  Special Instructions:      Please read over the following fact sheets that you were given:   Surgical Site Infection Prevention

## 2014-05-03 NOTE — H&P (Signed)
Patient ID: Mary Robles, female   DOB: 02/18/1976, 37 y.o.   MRN: [id number] GYNECOLOGY  VISIT   HPI: 37 y.o.   Married  [place] American  female   [id number] with Patient's last menstrual period was 03/08/2014.  Status post BTL. here to discuss surgery.  Husband is present for entire visit today.  Skips menses usually in January or February.  Wants to proceed with hysterectomy and removal of fallopian tubes along with removal of abdominal wall lipoma. Declines future childbearing.   Patient has heavy menses with clotting and pelvic pain before, during, and after menses.  Pain occurs especially during her menses.  Pelvic ultrasound on 02/04/14 showed 4 fibroids: 0.92 - 2.52 cm. One is pedunculated (17 mm) and one in encroaching on the cavity versus submucosal at the fundus (16 mm). EMS 10.88 mm. 1.7 cm left CL cyst. Normal right ovary. Minimal free fluid.  Endometrial biopsy 04/02/14 showed benign weakly proliferative endometrium.  Patient is status post BTL. Oral contraceptives caused increased blood pressure. Depo Provera caused weight gain in past.  Used Norplant as a teen ager.   Patient has a large abdominal wall lipoma 11 cm noted on CT scan of abdomen and pelvis ordered in 2011 for pain. Patient has seen Dr. Steven Gross for consultation and is planning for excision at the time of hysterectomy.   GYNECOLOGIC HISTORY: Patient's last menstrual period was 03/08/2014. Contraception: Tubal   Menopausal hormone therapy: n/a Pap:  No intraepithelial lesion or malignancy, negative HR HPV on 01/21/14.        OB History   Grav Para Term Preterm Abortions TAB SAB Ect Mult Living   4 2   1 1    2         Patient Active Problem List   Diagnosis Date Noted  . Abdominal wall mass of right lower quadrant, 11cm, probable lipoma 02/18/2014  . Fibroid uterus 02/18/2014  . Hypertension   . Vitamin D deficiency     Past Medical History  Diagnosis Date  . Herpes simplex   . Sickle cell  trait   . Asthma   . Anxiety   . Hypertension   . Hyperlysinemia   . Vitamin D deficiency   . Depression     Past Surgical History  Procedure Laterality Date  . Tubal ligation    . Foot surgery      Current Outpatient Prescriptions  Medication Sig Dispense Refill  . amLODipine (NORVASC) 5 MG tablet TAKE ONE TABLET BY MOUTH ONCE DAILY  30 tablet  5  . benazepril (LOTENSIN) 20 MG tablet TAKE ONE TABLET BY MOUTH ONCE DAILY  30 tablet  10  . cloNIDine (CATAPRES) 0.1 MG tablet TAKE ONE-HALF TABLET BY MOUTH THREE TIMES DAILY  60 tablet  10  . fluticasone (FLONASE) 50 MCG/ACT nasal spray Place 2 sprays into the nose daily.  16 g  6  . pravastatin (PRAVACHOL) 40 MG tablet TAKE ONE TABLET BY MOUTH ONCE DAILY  30 tablet  5   No current facility-administered medications for this visit.     ALLERGIES: Betadine  Family History  Problem Relation Age of Onset  . Diabetes Mother   . Thyroid disease Mother   . Mental retardation Maternal Aunt   . Diabetes Maternal Grandmother     History   Social History  . Marital Status: Married    Spouse Name: N/A    Number of Children: N/A  . Years of Education: N/A   Occupational History  .   Diabetes  Mother    .  Thyroid disease  Mother    .  Mental retardation  Maternal Aunt    .  Diabetes  Maternal Grandmother     History    Social History   .  Marital Status:  Married     Spouse Name:  N/A     Number of Children:  N/A   .  Years of Education:  N/A    Occupational History   .  Not on file.    Social History Main Topics   .  Smoking status:  Never Smoker   .  Smokeless tobacco:  Never Used   .  Alcohol Use:  0.5 oz/week     1 drink(s) per week      Comment: occ glass of wine   .  Drug Use:  No   .  Sexual Activity:  Yes     Partners:  Male     Birth Control/ Protection:  Surgical      Comment: BTSP 2005    Other Topics  Concern   .  Not on file    Social History Narrative   .  No narrative on file   ROS: Pertinent items are noted in HPI.  PHYSICAL EXAMINATION:  BP 114/70  Pulse 76  Ht 5\' 5"  (1.651 m)  Wt 179 lb (81.194 kg)  BMI 29.79 kg/m2  LMP 03/08/2014  General appearance: alert, cooperative and appears stated age  Lungs: clear to auscultation  bilaterally  Heart: regular rate and rhythm  Abdomen: umbilical incision, soft, non-tender; no masses, no organomegaly  No abnormal inguinal nodes palpated  Pelvic: External genitalia: Left labia with 1 cm - 5 mm area of induration and erythema.  Urethra: normal appearing urethra with no masses, tenderness or lesions  Bartholins and Skenes: normal  Vagina: normal appearing vagina with normal color and discharge, no lesions  Cervix: normal appearance  Bimanual Exam: Uterus: uterus is normal size, shape, consistency and nontender  Adnexa: normal adnexa in size, nontender and no masses  Rectovaginal: Confirms  Anus: normal sphincter tone, no lesions  ASSESSMENT  Status post BTL.  Symptomatic uterine fibroids.  Abdominal wall lipoma.  HSV outbreak.  PLAN  Proceed with total abdominal hysterectomy with bilateral salpingectomy by me and concurrent excision of abdominal wall lipoma by Dr. Michael Boston. I have reviewed hysterectomy procedure, risks and benefits of procedure, and surgical recovery expectations. Risks of surgery include but are not limited to bleeding, infection, damage to surrounding organs, DVT, PE, death, pneumonia, reaction to anesthesia, hernia formation of the incisions, and need for reoperation.  The patient wishes to proceed.  Rx for Famvir. See Epic.  An After Visit Summary was printed and given to the patient.  __40____ minutes face to face time of which over 50% was spent in counseling.

## 2014-05-04 ENCOUNTER — Inpatient Hospital Stay (HOSPITAL_COMMUNITY): Payer: BC Managed Care – PPO | Admitting: Anesthesiology

## 2014-05-04 ENCOUNTER — Encounter (HOSPITAL_COMMUNITY): Payer: BC Managed Care – PPO | Admitting: Anesthesiology

## 2014-05-04 ENCOUNTER — Inpatient Hospital Stay (HOSPITAL_COMMUNITY)
Admission: RE | Admit: 2014-05-04 | Discharge: 2014-05-06 | DRG: 742 | Disposition: A | Discharge status: Home / Self Care | Payer: BC Managed Care – PPO | Source: Ambulatory Visit | Attending: Obstetrics and Gynecology | Admitting: Obstetrics and Gynecology

## 2014-05-04 ENCOUNTER — Encounter (HOSPITAL_COMMUNITY)
Admission: RE | Disposition: A | Discharge status: Home / Self Care | Payer: Self-pay | Source: Ambulatory Visit | Attending: Obstetrics and Gynecology

## 2014-05-04 ENCOUNTER — Encounter (HOSPITAL_COMMUNITY): Payer: Self-pay | Admitting: *Deleted

## 2014-05-04 DIAGNOSIS — N949 Unspecified condition associated with female genital organs and menstrual cycle: Secondary | ICD-10-CM | POA: Diagnosis present

## 2014-05-04 DIAGNOSIS — D250 Submucous leiomyoma of uterus: Secondary | ICD-10-CM | POA: Diagnosis present

## 2014-05-04 DIAGNOSIS — D259 Leiomyoma of uterus, unspecified: Secondary | ICD-10-CM

## 2014-05-04 DIAGNOSIS — Z90710 Acquired absence of both cervix and uterus: Secondary | ICD-10-CM

## 2014-05-04 DIAGNOSIS — E559 Vitamin D deficiency, unspecified: Secondary | ICD-10-CM | POA: Diagnosis present

## 2014-05-04 DIAGNOSIS — N920 Excessive and frequent menstruation with regular cycle: Secondary | ICD-10-CM | POA: Diagnosis present

## 2014-05-04 DIAGNOSIS — A6000 Herpesviral infection of urogenital system, unspecified: Secondary | ICD-10-CM | POA: Diagnosis present

## 2014-05-04 DIAGNOSIS — D573 Sickle-cell trait: Secondary | ICD-10-CM | POA: Diagnosis present

## 2014-05-04 DIAGNOSIS — E7289 Other specified disorders of amino-acid metabolism: Secondary | ICD-10-CM | POA: Diagnosis present

## 2014-05-04 DIAGNOSIS — D251 Intramural leiomyoma of uterus: Principal | ICD-10-CM | POA: Diagnosis present

## 2014-05-04 DIAGNOSIS — I10 Essential (primary) hypertension: Secondary | ICD-10-CM | POA: Diagnosis present

## 2014-05-04 DIAGNOSIS — D1739 Benign lipomatous neoplasm of skin and subcutaneous tissue of other sites: Secondary | ICD-10-CM

## 2014-05-04 DIAGNOSIS — J45909 Unspecified asthma, uncomplicated: Secondary | ICD-10-CM | POA: Diagnosis present

## 2014-05-04 DIAGNOSIS — N9489 Other specified conditions associated with female genital organs and menstrual cycle: Secondary | ICD-10-CM

## 2014-05-04 DIAGNOSIS — D1779 Benign lipomatous neoplasm of other sites: Secondary | ICD-10-CM | POA: Diagnosis present

## 2014-05-04 HISTORY — PX: ABDOMINAL HYSTERECTOMY: SHX81

## 2014-05-04 HISTORY — DX: Acquired absence of both cervix and uterus: Z90.710

## 2014-05-04 HISTORY — PX: LIPOMA EXCISION: SHX5283

## 2014-05-04 HISTORY — PX: LAPAROTOMY: SHX154

## 2014-05-04 LAB — PREGNANCY, URINE: Preg Test, Ur: NEGATIVE

## 2014-05-04 SURGERY — HYSTERECTOMY, ABDOMINAL
Anesthesia: General | Site: Abdomen

## 2014-05-04 MED ORDER — KETOROLAC TROMETHAMINE 30 MG/ML IJ SOLN
INTRAMUSCULAR | Status: DC | PRN
  Administered 2014-05-04: 30 mg via INTRAVENOUS

## 2014-05-04 MED ORDER — HYDROMORPHONE HCL PF 1 MG/ML IJ SOLN
INTRAMUSCULAR | Status: AC
  Filled 2014-05-04: qty 1

## 2014-05-04 MED ORDER — LACTATED RINGERS IV SOLN
INTRAVENOUS | Status: DC
  Administered 2014-05-04 (×2): via INTRAVENOUS

## 2014-05-04 MED ORDER — LORATADINE 10 MG PO TABS
10.0000 mg | ORAL_TABLET | Freq: Every day | ORAL | Status: DC
  Filled 2014-05-04 (×3): qty 1

## 2014-05-04 MED ORDER — CEFAZOLIN SODIUM-DEXTROSE 2-3 GM-% IV SOLR
INTRAVENOUS | Status: AC
  Filled 2014-05-04: qty 50

## 2014-05-04 MED ORDER — FENTANYL CITRATE 0.05 MG/ML IJ SOLN
INTRAMUSCULAR | Status: DC | PRN
  Administered 2014-05-04 (×2): 100 ug via INTRAVENOUS
  Administered 2014-05-04 (×2): 50 ug via INTRAVENOUS
  Administered 2014-05-04: 100 ug via INTRAVENOUS

## 2014-05-04 MED ORDER — NALOXONE HCL 0.4 MG/ML IJ SOLN: 0.4000 mg | INTRAMUSCULAR | Status: DC | PRN

## 2014-05-04 MED ORDER — ONDANSETRON HCL 4 MG PO TABS: 4.0000 mg | ORAL_TABLET | Freq: Four times a day (QID) | ORAL | Status: DC | PRN

## 2014-05-04 MED ORDER — IBUPROFEN 600 MG PO TABS
600.0000 mg | ORAL_TABLET | Freq: Four times a day (QID) | ORAL | Status: DC | PRN
  Administered 2014-05-06: 600 mg via ORAL
  Filled 2014-05-04: qty 1

## 2014-05-04 MED ORDER — KETOROLAC TROMETHAMINE 30 MG/ML IJ SOLN
INTRAMUSCULAR | Status: AC
  Administered 2014-05-04: 30 mg via INTRAVENOUS
  Filled 2014-05-04: qty 1

## 2014-05-04 MED ORDER — HYDROMORPHONE HCL PF 1 MG/ML IJ SOLN
0.2500 mg | INTRAMUSCULAR | Status: DC | PRN
  Administered 2014-05-04 (×2): 0.5 mg via INTRAVENOUS

## 2014-05-04 MED ORDER — KETOROLAC TROMETHAMINE 30 MG/ML IJ SOLN
30.0000 mg | Freq: Four times a day (QID) | INTRAMUSCULAR | Status: AC
  Administered 2014-05-04 – 2014-05-05 (×4): 30 mg via INTRAVENOUS
  Filled 2014-05-04 (×4): qty 1

## 2014-05-04 MED ORDER — ACETAMINOPHEN 160 MG/5ML PO SOLN: 325.0000 mg | ORAL | Status: DC | PRN

## 2014-05-04 MED ORDER — DEXAMETHASONE SODIUM PHOSPHATE 10 MG/ML IJ SOLN
INTRAMUSCULAR | Status: DC | PRN
  Administered 2014-05-04: 200 mg via INTRAVENOUS
  Administered 2014-05-04: 10 mg via INTRAVENOUS

## 2014-05-04 MED ORDER — LIDOCAINE HCL (CARDIAC) 20 MG/ML IV SOLN
INTRAVENOUS | Status: AC
  Filled 2014-05-04: qty 5

## 2014-05-04 MED ORDER — FENTANYL CITRATE 0.05 MG/ML IJ SOLN
INTRAMUSCULAR | Status: AC
  Filled 2014-05-04: qty 5

## 2014-05-04 MED ORDER — ONDANSETRON HCL 4 MG/2ML IJ SOLN
INTRAMUSCULAR | Status: DC | PRN
  Administered 2014-05-04 (×2): 2 mg via INTRAVENOUS

## 2014-05-04 MED ORDER — OXYCODONE HCL 5 MG/5ML PO SOLN: 5.0000 mg | Freq: Once | ORAL | Status: DC | PRN

## 2014-05-04 MED ORDER — ROCURONIUM BROMIDE 100 MG/10ML IV SOLN
INTRAVENOUS | Status: DC | PRN
  Administered 2014-05-04: 50 mg via INTRAVENOUS

## 2014-05-04 MED ORDER — BENAZEPRIL HCL 20 MG PO TABS
20.0000 mg | ORAL_TABLET | Freq: Every day | ORAL | Status: DC
  Administered 2014-05-04 – 2014-05-05 (×2): 20 mg via ORAL
  Filled 2014-05-04 (×2): qty 1

## 2014-05-04 MED ORDER — PROMETHAZINE HCL 25 MG/ML IJ SOLN: 6.2500 mg | INTRAMUSCULAR | Status: DC | PRN

## 2014-05-04 MED ORDER — LIDOCAINE HCL (CARDIAC) 20 MG/ML IV SOLN
INTRAVENOUS | Status: DC | PRN
  Administered 2014-05-04: 80 mg via INTRAVENOUS

## 2014-05-04 MED ORDER — CEFOTETAN DISODIUM 2 G IJ SOLR
2.0000 g | INTRAMUSCULAR | Status: AC
  Administered 2014-05-04: 2 g via INTRAVENOUS
  Filled 2014-05-04: qty 2

## 2014-05-04 MED ORDER — ONDANSETRON HCL 4 MG/2ML IJ SOLN: 4.0000 mg | Freq: Four times a day (QID) | INTRAMUSCULAR | Status: DC | PRN

## 2014-05-04 MED ORDER — ONDANSETRON HCL 4 MG/2ML IJ SOLN
INTRAMUSCULAR | Status: AC
  Filled 2014-05-04: qty 2

## 2014-05-04 MED ORDER — AMLODIPINE BESYLATE 5 MG PO TABS
5.0000 mg | ORAL_TABLET | Freq: Every day | ORAL | Status: DC
  Administered 2014-05-04 – 2014-05-05 (×2): 5 mg via ORAL
  Filled 2014-05-04 (×2): qty 1

## 2014-05-04 MED ORDER — CEFAZOLIN SODIUM-DEXTROSE 2-3 GM-% IV SOLR: 2.0000 g | INTRAVENOUS | Status: DC

## 2014-05-04 MED ORDER — KETOROLAC TROMETHAMINE 30 MG/ML IJ SOLN
15.0000 mg | Freq: Once | INTRAMUSCULAR | Status: AC | PRN
  Administered 2014-05-04: 30 mg via INTRAVENOUS

## 2014-05-04 MED ORDER — HYDROMORPHONE HCL PF 1 MG/ML IJ SOLN
INTRAMUSCULAR | Status: DC | PRN
  Administered 2014-05-04: 1 mg via INTRAVENOUS

## 2014-05-04 MED ORDER — CLONIDINE HCL 0.1 MG PO TABS
0.1000 mg | ORAL_TABLET | Freq: Two times a day (BID) | ORAL | Status: DC
Start: 2014-05-05 — End: 2014-05-06
  Administered 2014-05-05 (×2): 0.1 mg via ORAL
  Filled 2014-05-04 (×3): qty 1

## 2014-05-04 MED ORDER — MIDAZOLAM HCL 2 MG/2ML IJ SOLN
INTRAMUSCULAR | Status: AC
  Filled 2014-05-04: qty 2

## 2014-05-04 MED ORDER — MORPHINE SULFATE (PF) 1 MG/ML IV SOLN
INTRAVENOUS | Status: DC
  Administered 2014-05-04: 13:00:00 via INTRAVENOUS
  Administered 2014-05-04: 0.4 mg via INTRAVENOUS
  Administered 2014-05-04: 11 mg via INTRAVENOUS
  Administered 2014-05-05: 1 mg via INTRAVENOUS
  Filled 2014-05-04: qty 25

## 2014-05-04 MED ORDER — NEOSTIGMINE METHYLSULFATE 10 MG/10ML IV SOLN
INTRAVENOUS | Status: AC
  Filled 2014-05-04: qty 1

## 2014-05-04 MED ORDER — LACTATED RINGERS IV SOLN: INTRAVENOUS | Status: DC

## 2014-05-04 MED ORDER — MIDAZOLAM HCL 2 MG/2ML IJ SOLN
INTRAMUSCULAR | Status: DC | PRN
  Administered 2014-05-04: 1.5 mg via INTRAVENOUS
  Administered 2014-05-04: 0.5 mg via INTRAVENOUS

## 2014-05-04 MED ORDER — GLYCOPYRROLATE 0.2 MG/ML IJ SOLN
INTRAMUSCULAR | Status: AC
  Filled 2014-05-04: qty 3

## 2014-05-04 MED ORDER — ACETAMINOPHEN 325 MG PO TABS: 325.0000 mg | ORAL_TABLET | ORAL | Status: DC | PRN

## 2014-05-04 MED ORDER — SODIUM CHLORIDE 0.9 % IR SOLN
Status: DC | PRN
  Administered 2014-05-04: 3000 mL

## 2014-05-04 MED ORDER — PROPOFOL 10 MG/ML IV EMUL
INTRAVENOUS | Status: AC
  Filled 2014-05-04: qty 20

## 2014-05-04 MED ORDER — MENTHOL 3 MG MT LOZG
1.0000 | LOZENGE | OROMUCOSAL | Status: DC | PRN
  Administered 2014-05-05: 3 mg via ORAL
  Filled 2014-05-04: qty 9

## 2014-05-04 MED ORDER — PROPOFOL 10 MG/ML IV BOLUS
INTRAVENOUS | Status: DC | PRN
  Administered 2014-05-04: 170 mg via INTRAVENOUS

## 2014-05-04 MED ORDER — HYDROMORPHONE HCL PF 1 MG/ML IJ SOLN
INTRAMUSCULAR | Status: AC
  Administered 2014-05-04: 0.5 mg via INTRAVENOUS
  Filled 2014-05-04: qty 1

## 2014-05-04 MED ORDER — ROCURONIUM BROMIDE 100 MG/10ML IV SOLN
INTRAVENOUS | Status: AC
  Filled 2014-05-04: qty 1

## 2014-05-04 MED ORDER — LACTATED RINGERS IV SOLN
INTRAVENOUS | Status: DC
  Administered 2014-05-04 – 2014-05-05 (×2): via INTRAVENOUS

## 2014-05-04 MED ORDER — NEOSTIGMINE METHYLSULFATE 10 MG/10ML IV SOLN
INTRAVENOUS | Status: DC | PRN
  Administered 2014-05-04: 3 mg via INTRAVENOUS

## 2014-05-04 MED ORDER — OXYCODONE-ACETAMINOPHEN 5-325 MG PO TABS
1.0000 | ORAL_TABLET | ORAL | Status: DC | PRN
  Administered 2014-05-05 (×2): 2 via ORAL
  Filled 2014-05-04 (×2): qty 2

## 2014-05-04 MED ORDER — DIPHENHYDRAMINE HCL 12.5 MG/5ML PO ELIX: 12.5000 mg | ORAL_SOLUTION | Freq: Four times a day (QID) | ORAL | Status: DC | PRN

## 2014-05-04 MED ORDER — GLYCOPYRROLATE 0.2 MG/ML IJ SOLN
INTRAMUSCULAR | Status: DC | PRN
  Administered 2014-05-04 (×2): 0.1 mg via INTRAVENOUS
  Administered 2014-05-04: 0.4 mg via INTRAVENOUS

## 2014-05-04 MED ORDER — DIPHENHYDRAMINE HCL 50 MG/ML IJ SOLN: 12.5000 mg | Freq: Four times a day (QID) | INTRAMUSCULAR | Status: DC | PRN

## 2014-05-04 MED ORDER — OXYCODONE HCL 5 MG PO TABS: 5.0000 mg | ORAL_TABLET | Freq: Once | ORAL | Status: DC | PRN

## 2014-05-04 MED ORDER — SODIUM CHLORIDE 0.9 % IJ SOLN: 9.0000 mL | INTRAMUSCULAR | Status: DC | PRN

## 2014-05-04 MED ORDER — KETOROLAC TROMETHAMINE 30 MG/ML IJ SOLN
INTRAMUSCULAR | Status: AC
  Filled 2014-05-04: qty 1

## 2014-05-04 MED ORDER — DEXAMETHASONE SODIUM PHOSPHATE 10 MG/ML IJ SOLN
INTRAMUSCULAR | Status: AC
  Filled 2014-05-04: qty 1

## 2014-05-04 SURGICAL SUPPLY — 54 items
APL SKNCLS STERI-STRIP NONHPOA (GAUZE/BANDAGES/DRESSINGS) ×2
APPLICATOR COTTON TIP 6IN STRL (MISCELLANEOUS) ×3 IMPLANT
BAG URINE DRAINAGE (UROLOGICAL SUPPLIES) ×3 IMPLANT
BENZOIN TINCTURE PRP APPL 2/3 (GAUZE/BANDAGES/DRESSINGS) ×1 IMPLANT
BLADE EXTENDED COATED 6.5IN (ELECTRODE) ×1 IMPLANT
BNDG GAUZE ELAST 4 BULKY (GAUZE/BANDAGES/DRESSINGS) IMPLANT
CANISTER SUCT 3000ML (MISCELLANEOUS) ×3 IMPLANT
CATH FOLEY 3WAY  5CC 16FR (CATHETERS)
CATH FOLEY 3WAY 5CC 16FR (CATHETERS) ×2 IMPLANT
CLOTH BEACON ORANGE TIMEOUT ST (SAFETY) ×3 IMPLANT
DECANTER SPIKE VIAL GLASS SM (MISCELLANEOUS) IMPLANT
DRAPE WARM FLUID 44X44 (DRAPE) ×1 IMPLANT
DRSG OPSITE POSTOP 4X10 (GAUZE/BANDAGES/DRESSINGS) ×1 IMPLANT
DRSG TELFA 3X8 NADH (GAUZE/BANDAGES/DRESSINGS) ×3 IMPLANT
ELECT CAUTERY BLADE 6.4 (BLADE) IMPLANT
ELECT REM PT RETURN 9FT ADLT (ELECTROSURGICAL) ×3
ELECTRODE REM PT RTRN 9FT ADLT (ELECTROSURGICAL) ×2 IMPLANT
GLOVE BIO SURGEON STRL SZ 6.5 (GLOVE) ×3 IMPLANT
GLOVE BIOGEL PI IND STRL 8 (GLOVE) ×2 IMPLANT
GLOVE BIOGEL PI INDICATOR 8 (GLOVE) ×1
GLOVE ECLIPSE 8.0 STRL XLNG CF (GLOVE) ×3 IMPLANT
GOWN STRL REUS W/TWL LRG LVL3 (GOWN DISPOSABLE) ×12 IMPLANT
NDL HYPO 25X1 1.5 SAFETY (NEEDLE) IMPLANT
NEEDLE HYPO 25X1 1.5 SAFETY (NEEDLE) ×3 IMPLANT
NS IRRIG 1000ML POUR BTL (IV SOLUTION) ×6 IMPLANT
PACK ABDOMINAL GYN (CUSTOM PROCEDURE TRAY) ×6 IMPLANT
PAD ABD 7.5X8 STRL (GAUZE/BANDAGES/DRESSINGS) IMPLANT
PAD DRESSING TELFA 3X8 NADH (GAUZE/BANDAGES/DRESSINGS) IMPLANT
PAD OB MATERNITY 4.3X12.25 (PERSONAL CARE ITEMS) ×3 IMPLANT
PROTECTOR NERVE ULNAR (MISCELLANEOUS) ×3 IMPLANT
SET CYSTO W/LG BORE CLAMP LF (SET/KITS/TRAYS/PACK) ×1 IMPLANT
SHEET LAVH (DRAPES) ×1 IMPLANT
SPONGE GAUZE 4X4 12PLY (GAUZE/BANDAGES/DRESSINGS) ×1 IMPLANT
SPONGE LAP 18X18 X RAY DECT (DISPOSABLE) ×6 IMPLANT
STAPLER VISISTAT 35W (STAPLE) ×3 IMPLANT
SUT PDS AB 1 CT1 36 (SUTURE) ×2 IMPLANT
SUT PDS AB 1 CTX 36 (SUTURE) IMPLANT
SUT PLAIN 2 0 XLH (SUTURE) ×1 IMPLANT
SUT SILK 2 0 SH CR/8 (SUTURE) IMPLANT
SUT SILK 3 0 SH CR/8 (SUTURE) IMPLANT
SUT VIC AB 0 CT1 18XCR BRD8 (SUTURE) ×4 IMPLANT
SUT VIC AB 0 CT1 27 (SUTURE) ×9
SUT VIC AB 0 CT1 27XBRD ANBCTR (SUTURE) ×6 IMPLANT
SUT VIC AB 0 CT1 8-18 (SUTURE) ×9
SUT VIC AB 2-0 CT1 27 (SUTURE) ×3
SUT VIC AB 2-0 CT1 TAPERPNT 27 (SUTURE) ×2 IMPLANT
SUT VIC AB 4-0 PS2 27 (SUTURE) ×1 IMPLANT
SUT VICRYL 0 TIES 12 18 (SUTURE) ×3 IMPLANT
SYR CONTROL 10ML LL (SYRINGE) ×1 IMPLANT
TOWEL OR 17X24 6PK STRL BLUE (TOWEL DISPOSABLE) ×12 IMPLANT
TRAY FOLEY CATH 14FR (SET/KITS/TRAYS/PACK) ×1 IMPLANT
TUBING NON-CON 1/4 X 20 CONN (TUBING) ×3 IMPLANT
WATER STERILE IRR 1000ML POUR (IV SOLUTION) ×6 IMPLANT
YANKAUER SUCT BULB TIP NO VENT (SUCTIONS) ×3 IMPLANT

## 2014-05-04 NOTE — Anesthesia Postprocedure Evaluation (Signed)
  Anesthesia Post-op Note  Anesthesia Post Note  Patient: Mary Robles  Procedure(s) Performed: Procedure(s) (LRB): HYSTERECTOMY ABDOMINAL with BILATERAL SALPINGECTOMY  (N/A) EXCISION LIPOMA of abdominal wall (N/A) REMOVAL OF ABD WALL MASS  (N/A)  Anesthesia type: General  Patient location: PACU  Post pain: Pain level controlled  Post assessment: Post-op Vital signs reviewed  Last Vitals:  Filed Vitals:   05/04/14 1145  BP: 112/70  Pulse: 90  Temp:   Resp: 13    Post vital signs: Reviewed  Level of consciousness: sedated  Complications: No apparent anesthesia complications

## 2014-05-04 NOTE — Progress Notes (Signed)
Update to history and physical  No marked change in status. Patient examined.   OK to proceed with surgery.

## 2014-05-04 NOTE — Addendum Note (Signed)
Addendum created 05/04/14 1619 by Jonna Munro, CRNA   Modules edited: Notes Section   Notes Section:  File: [id number]

## 2014-05-04 NOTE — Brief Op Note (Signed)
05/04/2014  10:11 AM  PATIENT:  Mary Robles  38 y.o. female  PRE-OPERATIVE DIAGNOSIS:  symptomatic uterine fibroids, pelvic pain,abdominal wall lipoma PRE-PERITONEAL ABDOMINAL WALL MASS   POST-OPERATIVE DIAGNOSIS:  symptomatic uterine fibroids, pelvic pain, abdominal wall lipoma  PROCEDURE:  Procedure(s): HYSTERECTOMY ABDOMINAL with BILATERAL SALPINGECTOMY  (N/A) EXCISION LIPOMA of abdominal wall (N/A) REMOVAL OF ABD WALL MASS  (N/A)  SURGEON:  Surgeon(s) and Role: Panel 1:    * Kailei Cowens E Amundson de Berton Lan, MD - Primary    * Lyman Speller, MD - Assisting  Panel 2:    * Adin Hector, MD - Primary  PHYSICIAN ASSISTANT:   ASSISTANTS:  M. Edwinna Areola, MD   ANESTHESIA:   general  EBL:  Total I/O In: 1600 [I.V.:1600] Out: 100 [Blood:100]  BLOOD ADMINISTERED:none  DRAINS: Urinary Catheter (Foley)   LOCAL MEDICATIONS USED:  NONE  SPECIMEN:  Source of Specimen:   Uterus, cervix, bilateral tubes, Filchie clip, abdominal mass mass  DISPOSITION OF SPECIMEN:  PATHOLOGY  COUNTS:  YES  TOURNIQUET:  * No tourniquets in log *  DICTATION: .Other Dictation: Dictation Number    PLAN OF CARE: Admit to inpatient   PATIENT DISPOSITION:  PACU - hemodynamically stable.   Delay start of Pharmacological VTE agent (>24hrs) due to surgical blood loss or risk of bleeding: not applicable

## 2014-05-04 NOTE — Anesthesia Preprocedure Evaluation (Addendum)
Anesthesia Evaluation  Patient identified by MRN, date of birth, ID band Patient awake    Reviewed: Allergy & Precautions, H&P , Patient's Chart, lab work & pertinent test results  Airway Mallampati: II TM Distance: >3 FB Neck ROM: full    Dental   Pulmonary asthma ,  breath sounds clear to auscultation        Cardiovascular hypertension, Rhythm:regular Rate:Normal     Neuro/Psych    GI/Hepatic   Endo/Other    Renal/GU      Musculoskeletal   Abdominal   Peds  Hematology   Anesthesia Other Findings   Reproductive/Obstetrics (+) Pregnancy                          Anesthesia Physical Anesthesia Plan  ASA: II  Anesthesia Plan: General ETT   Post-op Pain Management:    Induction:   Airway Management Planned:   Additional Equipment:   Intra-op Plan:   Post-operative Plan:   Informed Consent: I have reviewed the patients History and Physical, chart, labs and discussed the procedure including the risks, benefits and alternatives for the proposed anesthesia with the patient or authorized representative who has indicated his/her understanding and acceptance.     Plan Discussed with:   Anesthesia Plan Comments:        Anesthesia Quick Evaluation

## 2014-05-04 NOTE — Consult Note (Addendum)
[place], MD, [address] [place], [place] [address] Phone: [phone] FAX: [phone]     Mary Robles  1976-09-30 [id number]  CARE TEAM:  PCP: Odette Fraction, MD  Outpatient Care Team: Patient Care Team: Susy Frizzle, MD as PCP - General (Family Medicine) Jamey Reas de Berton Lan, MD as Consulting Physician (Obstetrics and Gynecology)  Inpatient Treatment Team: Treatment Team: Attending Provider: Jamey Reas de Berton Lan, MD This patient is a 38 y.o.female who presents today for surgical evaluation at the request of Dr. Quincy Simmonds.   Reason for visit: Abdominal wall mass, enlarging lipoma -  need for excision   Pleasant female with intermittent episodes of mild abdominal pain and constipation. Not severe. Underwent CAT scan 01/2013 & found to have fatty mass in abdominal wall (subfascial) c/w 11cm flat lipoma.   Repeat CT notes that is larger.  No one including the patient can feel a mass. Does not feel symptoms in the right lower quadrant where it is located. Apparently she has a fibroid uterus and there is discussion concerning possibility of performing hysterectomy to treat this. Her gynecologist requested surgical evaluation.   The patient has no history of fall or trauma. No history of infection, motor vehicle collision. No history of neurofibromatosis or lipomas elsewhere. Weight has been stable. No fevers or chills. No changes in energy level. No night sweats.   No personal nor family history of GI/colon cancer, inflammatory bowel disease, irritable bowel syndrome, allergy such as Celiac Sprue, dietary/dairy problems, colitis, ulcers nor gastritis. No recent sick contacts/gastroenteritis. No travel outside the country. No changes in diet. No dysphagia to solids or liquids. No significant heartburn or reflux. No hematochezia, hematemesis, coffee ground emesis. No  evidence of prior gastric/peptic ulceration.      Past Medical History  Diagnosis Date  . Herpes simplex   . Sickle cell trait   . Anxiety   . Hypertension   . Hyperlysinemia   . Vitamin D deficiency   . Depression   . Asthma     no symptoms in "years", no inhaler    Past Surgical History  Procedure Laterality Date  . Tubal ligation    . Foot surgery      History   Social History  . Marital Status: Married    Spouse Name: N/A    Number of Children: N/A  . Years of Education: N/A   Occupational History  . Not on file.   Social History Main Topics  . Smoking status: Never Smoker   . Smokeless tobacco: Never Used  . Alcohol Use: 0.5 oz/week    1 drink(s) per week     Comment: occ glass of wine  . Drug Use: No  . Sexual Activity: Yes    Partners: Male    Birth Control/ Protection: Surgical     Comment: BTSP 2005   Other Topics Concern  . Not on file   Social History Narrative  . No narrative on file    Family History  Problem Relation Age of Onset  . Diabetes Mother   . Thyroid disease Mother   . Mental retardation Maternal Aunt   . Diabetes Maternal Grandmother     Current Facility-Administered Medications  Medication Dose Route Frequency Provider Last Rate Last Dose  . ceFAZolin (ANCEF) 2-3 GM-% IVPB SOLR           . ceFAZolin (  ANCEF) IVPB 2 g/50 mL premix  2 g Intravenous On Call to OR Adin Hector, MD      . cefoTEtan (CEFOTAN) 2 g in dextrose 5 % 50 mL IVPB  2 g Intravenous On Call to [place], MD      . lactated ringers infusion   Intravenous Continuous Assunta Gambles, MD 10 mL/hr at 05/04/14 [phone]    . lactated ringers infusion   Intravenous Continuous Brook E Amundson de Berton Lan, MD         Allergies  Allergen Reactions  . Betadine [Povidone Iodine]     Reaction:Blisters    ROS: Constitutional:  No fevers, chills, sweats.  Weight stable Eyes:  No vision changes, No discharge HENT:  No sore  throats, nasal drainage Lymph: No neck swelling, No bruising easily Pulmonary:  No cough, productive sputum CV: No orthopnea, PND  Patient walks 30 minutes for about 1 miles without difficulty.  No exertional chest/neck/shoulder/arm pain. GI:  No personal nor family history of GI/colon cancer, inflammatory bowel disease, irritable bowel syndrome, allergy such as Celiac Sprue, dietary/dairy problems, colitis, ulcers nor gastritis.  No recent sick contacts/gastroenteritis.  No travel outside the country.  No changes in diet. Renal: No UTIs, No hematuria Genital:  No drainage, bleeding, masses Musculoskeletal: No severe joint pain.  Good ROM major joints Skin:  No sores or lesions.  No rashes Heme/Lymph:  No easy bleeding.  No swollen lymph nodes Neuro: No focal weakness/numbness.  No seizures Psych: No suicidal ideation.  No hallucinations  BP 119/61  Pulse 92  Temp(Src) 98.1 F (36.7 C) (Oral)  Resp 22  SpO2 100%  Physical Exam: General: Pt awake/alert/oriented x4 in no major acute distress Eyes: PERRL, normal EOM. Sclera nonicteric Neuro: CN II-XII intact w/o focal sensory/motor deficits. Lymph: No head/neck/groin lymphadenopathy Psych:  No delerium/psychosis/paranoia HENT: Normocephalic, Mucus membranes moist.  No thrush Neck: Supple, No tracheal deviation Chest: No pain.  Good respiratory excursion. CV:  Pulses intact.  Regular rhythm Abdomen: Soft, Nondistended.  Nontender.  No incarcerated hernias. Ext:  SCDs BLE.  No significant edema.  No cyanosis Skin: No petechiae / purpurea.  No major sores Musculoskeletal: No severe joint pain.  Good ROM major joints   Results:   Labs: No results found for this or any previous visit (from the past 48 hour(s)).  Imaging / Studies: No results found.  Medications / Allergies: per chart  Antibiotics: Anti-infectives   Start     Dose/Rate Route Frequency Ordered Stop   05/04/14 0615  ceFAZolin (ANCEF) 2-3 GM-% IVPB SOLR     Comments:  Earnhardt, Lou   : cabinet override      05/04/14 0615 05/04/14 1829   05/04/14 0600  cefoTEtan (CEFOTAN) 2 g in dextrose 5 % 50 mL IVPB     2 g 100 mL/hr over 30 Minutes Intravenous On call to O.R. 05/04/14 0556 05/05/14 0559   05/04/14 0405  ceFAZolin (ANCEF) IVPB 2 g/50 mL premix     2 g 100 mL/hr over 30 Minutes Intravenous On call to O.R. 05/04/14 0405 05/05/14 0559      Assessment  [name]  38 y.o. female  Day of Surgery  Procedure(s): HYSTERECTOMY ABDOMINAL with BILATERAL SALPINGECTOMY POSSIBLE BILATERAL SALPINGO OOPHORECTOMY  EXCISION LIPOMA of abdominal wall REMOVAL OF ABD WALL MASS   Problem List:  Active Problems:   * No active hospital problems. *   Enlarging abd wall mass  Plan:  Excision after hysterectomy:  The pathophysiology of abdominal wall masses was discussed.  Natural history risks without surgery were discussed.  I recommended surgery to remove the mass.  I explained the technique of removal with use of local anesthesia & possible need for more aggressive sedation/anesthesia for patient comfort.    Risks such as bleeding, infection, heart attack, death, and other risks were discussed.  I noted a good likelihood this will help address the problem.   Possibility that this will not correct all symptoms was explained. Possibility of regrowth/recurrence of the mass was discussed.  We will work to minimize complications. Questions were answered.  The patient expresses understanding & wishes to proceed with surgery.      -VTE prophylaxis- SCDs, etc -mobilize as tolerated to help recovery    Adin Hector, M.D., F.A.C.S. Gastrointestinal and Minimally Invasive Surgery [facility] [address], [place], [place] [address] [phone] Main / Paging   05/04/2014  Note: This dictation was prepared with Dragon/digital dictation along with [facility] technology. Any transcriptional errors that  result from this process are unintentional.

## 2014-05-04 NOTE — Anesthesia Postprocedure Evaluation (Signed)
  Anesthesia Post-op Note  Patient: Mary Robles  Procedure(s) Performed: Procedure(s): HYSTERECTOMY ABDOMINAL with BILATERAL SALPINGECTOMY  (N/A) EXCISION LIPOMA of abdominal wall (N/A) REMOVAL OF ABD WALL MASS  (N/A)  Patient Location: [facility] Unit  Anesthesia Type:General  Level of Consciousness: awake, alert  and oriented  Airway and Oxygen Therapy: Patient Spontanous Breathing and Patient connected to nasal cannula oxygen  Post-op Pain: none  Post-op Assessment: Post-op Vital signs reviewed, Patient's Cardiovascular Status Stable and Respiratory Function Stable  Post-op Vital Signs: Reviewed and stable  Last Vitals:  Filed Vitals:   05/04/14 1548  BP: 124/79  Pulse: 92  Temp: 36.7 C  Resp:     Complications: No apparent anesthesia complications

## 2014-05-04 NOTE — Progress Notes (Signed)
Day of Surgery Procedure(s) (LRB): HYSTERECTOMY ABDOMINAL with BILATERAL SALPINGECTOMY  (N/A) EXCISION LIPOMA of abdominal wall (N/A) REMOVAL OF ABD WALL MASS  (N/A)  Subjective: Patient reports incisional pain.   Pain adequately controlled. Sleeping off and on. Taking a small amount of clear liquids.  Objective: I have reviewed patient's vital signs and intake and output.  General: alert Resp: clear to auscultation bilaterally Cardio: regular rate and rhythm, S1, S2 normal, no murmur, click, rub or gallop GI: soft, non-tender; bowel sounds normal; no masses,  no organomegaly Extremities:  PAS and Ted hose on.  Vaginal Bleeding: minimal Bowel sounds are diminished and not normoactive. Incision:  Dressing is mildly blood stained inferiorly.    Assessment: s/p Procedure(s): HYSTERECTOMY ABDOMINAL with BILATERAL SALPINGECTOMY  (N/A) EXCISION LIPOMA of abdominal wall (N/A) REMOVAL OF ABD WALL MASS  (N/A): stable  Plan: Continue foley due to  postoperative state.  Clear liquids. Morphine PCA and Toradol. CBC and BMP in am.  I discussed surgical findings and procedure.  Questions answered.    LOS: 0 days    [place] 05/04/2014, 6:01 PM

## 2014-05-04 NOTE — Op Note (Signed)
05/04/2014  9:51 AM  PATIENT:  Mary Robles  38 y.o. female  Patient Care Team: Susy Frizzle, MD as PCP - General (Family Medicine) Jamey Reas de Berton Lan, MD as Consulting Physician (Obstetrics and Gynecology)  PRE-OPERATIVE DIAGNOSIS:   PRE-PERITONEAL ABDOMINAL WALL MASS   POST-OPERATIVE DIAGNOSIS:    ABDOMINAL WALL MASS (subfascial)  PROCEDURE:  Procedure(s):  REMOVAL OF ABD WALL MASS   SURGEON:  Surgeon(s):  Adin Hector, MD [place], MD - Asst  ANESTHESIA:   general  EBL:  Total I/O In: 1300 [I.V.:1300] Out: 80 [Blood:75]  Delay start of Pharmacological VTE agent (>24hrs) due to surgical blood loss or risk of bleeding:  no  DRAINS: none   SPECIMEN:  Source of Specimen:  Abdominal wall mass (probable lipoma) 14x12x3cm  DISPOSITION OF SPECIMEN:  PATHOLOGY  COUNTS:  YES  PLAN OF CARE: Admit for overnight observation  PATIENT DISPOSITION:  OR - intubated  INDICATION: Pleasant woman with issues of fibroids and planned hysterectomy.  Fatty abdominal wall mass noted on CAT scan.  Repeat a year later noted is larger.  Recommendation was made for removal:  The pathophysiology of abdominal wall masses was discussed.  Natural history risks without surgery were discussed.  I recommended surgery to remove the mass.  I explained the technique with general anesthesia for patient comfort.    Risks such as bleeding, infection, heart attack, death, and other risks were discussed.  I noted a good likelihood this will help address the problem.   Possibility that this will not correct all symptoms was explained. Possibility of regrowth/recurrence of the mass was discussed.  We will work to minimize complications. Questions were answered.  The patient expresses understanding & wishes to proceed with surgery.   OR FINDINGS: Fatty ellipsoid mass.  14 x 12 x 3 cm.  Well encapsulated.  Located just posterior to the anterior rectus  fascia and primarily lateral to the rectus muscles overlying the external oblique muscles.  Went laterally to the mid-axillary line and iliac crest  DESCRIPTION: Informed consent was confirmed.  The patient was arty under general anesthesia and the abdominal hysterectomy had been completed through a Pfannenstiel incision slightly cephalad.  I explored the abdominal wall.  We could see a small knuckle of fat exposed just deep to the anterior rectus fascia on the superior flap of the Pfannenstiel incision.  I was able through careful blunt and focused cautery dissection easily mobilize it off the anterior rectus fascia more laterally.  Was a large fatty soft ellipsoid mass.  It was rather well encapsulated.  I freed it off the abdominal wall anteriorly & then posteriorly off the musculature especially the lateral rectus and external oblique muscles.  It was densely adherent to the flank along the midaxillary line.  I carefully freed and all off as well as freed adhesions to the iliac crest just posterior to the anterior superior iliac spine.  I was able to remove it out intact.  We are able to remain superficial to the preperitoneal plane and the peritoneum.  We assured hemostasis.  We did copious irrigation.  Removed laparotomy sponges.  We close Pfannenstiel incision in layers, starting with a running 2-0 Vicryl stitch vertically at the peritoneum.  We did a few interrupted Vicryl stitches to help reapproximate the rectus muscle together.  We closed the anterior rectus fascia transversely using #1 running PDS to good result.  Irrigation done and hemostasis was good.  I scrubbed out of the case as Dr. Quincy Simmonds completed the closure of the skin using running absorbable suture.  I am about to locate family.  Instructions are written

## 2014-05-04 NOTE — Transfer of Care (Signed)
Immediate Anesthesia Transfer of Care Note  Patient: Mary Robles  Procedure(s) Performed: Procedure(s): HYSTERECTOMY ABDOMINAL with BILATERAL SALPINGECTOMY  (N/A) EXCISION LIPOMA of abdominal wall (N/A) REMOVAL OF ABD WALL MASS  (N/A)  Patient Location: PACU  Anesthesia Type:General  Level of Consciousness: awake, alert  and oriented  Airway & Oxygen Therapy: Patient Spontanous Breathing and Patient connected to nasal cannula oxygen  Post-op Assessment: Report given to PACU RN, Post -op Vital signs reviewed and stable and Patient moving all extremities X 4  Post vital signs: Reviewed and stable  Complications: No apparent anesthesia complications

## 2014-05-05 ENCOUNTER — Encounter (HOSPITAL_COMMUNITY): Payer: Self-pay | Admitting: Obstetrics and Gynecology

## 2014-05-05 LAB — CBC
HEMATOCRIT: 33.7 % — AB (ref 36.0–46.0)
Hemoglobin: 12.3 g/dL (ref 12.0–15.0)
MCH: 28.5 pg (ref 26.0–34.0)
MCHC: 36.5 g/dL — ABNORMAL HIGH (ref 30.0–36.0)
MCV: 78 fL (ref 78.0–100.0)
PLATELETS: 263 10*3/uL (ref 150–400)
RBC: 4.32 MIL/uL (ref 3.87–5.11)
RDW: 14.7 % (ref 11.5–15.5)
WBC: 15.8 10*3/uL — ABNORMAL HIGH (ref 4.0–10.5)

## 2014-05-05 LAB — BASIC METABOLIC PANEL
BUN: 7 mg/dL (ref 6–23)
CO2: 28 mEq/L (ref 19–32)
Calcium: 8.8 mg/dL (ref 8.4–10.5)
Chloride: 102 mEq/L (ref 96–112)
Creatinine, Ser: 0.71 mg/dL (ref 0.50–1.10)
GFR calc non Af Amer: >90 mL/min (ref >90)
Glucose, Bld: 114 mg/dL — ABNORMAL HIGH (ref 70–99)
POTASSIUM: 4.3 meq/L (ref 3.7–5.3)
Sodium: 137 mEq/L (ref 137–147)

## 2014-05-05 NOTE — Op Note (Signed)
Mary Robles NO.:  [id number]  MEDICAL RECORD NO.:  [id number]  LOCATION:  6945                          FACILITY:  [facility]  PHYSICIAN:  Mary Robles, M.D.   DATE OF BIRTH:  01-Feb-1976  DATE OF PROCEDURE:  05/04/2014 DATE OF DISCHARGE:                              OPERATIVE REPORT   PREOPERATIVE DIAGNOSES: 1. Symptomatic uterine fibroids. 2. Pelvic pain. 3. Abdominal wall mass.  POSTOPERATIVE DIAGNOSES: 1. Symptomatic uterine fibroids. 2. Pelvic pain. 3. Preperitoneal abdominal wall mass.  PROCEDURE:  Total abdominal hysterectomy with bilateral salpingectomy.  SURGEON:  Mary Robles, M.D.  ASSISTANT:  Mary Emory, MD  ANESTHESIA:  General endotracheal.  IV FLUIDS:  1600 mL of Ringer's lactate.  EBL:  100 mL.  URINE OUTPUT:  COMPLICATIONS:  None.  INDICATIONS FOR THE PROCEDURE:  The patient is a 38 year old gravida 1, para 2, African American female, status post bilateral tubal ligation, who presents with heavy menstrual bleeding and pelvic pain before, during and after her menstrual cycle.  Pelvic ultrasound confirmed the presence of four uterine fibroids, one of which appeared to be 1.7 cm and pedunculated and one of which encroached on the endometrial cavity versus a submucosal location measuring 1.6 cm.  The patient had normal- appearing ovaries.  An endometrial biopsy showed benign weakly proliferative endometrium.  The patient requested hysterectomy as she has completed her childbearing and declines medical therapy.  During chart review during the patient's workup, she was noted to have an 11-cm abdominal wall mass consistent with a lipoma on a CAT scan of the abdomen and pelvis, which was ordered in 2011 for pain.  The patient therefore was [facility] for consultation and evaluation.  A plan is now made to proceed with a total abdominal hysterectomy with bilateral salpingectomy through  a Pfannenstiel incision and then excision of the abdominal wall mass by Mary Robles.  I have reviewed with the patient risks, benefits, and alternatives to the hysterectomy with removal of fallopian tubes and the patient wishes to proceed.  FINDINGS:  Examination under anesthesia revealed a small, mobile uterus with no adnexal masses.  At the time of hysterectomy, the patient was noted to have a 1.5-cm anterior fundal subserosal fibroid.  She had what appeared to be a 2-cm intracavitary fibroid, which filled the uterine fundus.  The bilateral tubes were consistent with a prior tubal ligation.  The Filshie clip was noted to be floating freely in the pelvis.  Only one clip was noted. The bilateral ovaries were unremarkable.  There was no sign of any endometriosis, no adhesive disease in the abdomen or the pelvis.  The patient was noted to have a soft right abdominal wall mass, which was preperitoneal.  This was further described by Mary Robles in his dictation.  SPECIMEN:  The uterus with cervix and bilateral tubes and ovaries were sent to Pathology along with the one Filshie clip.  Mary Robles is sending the abdominal wall mass to Pathology separately.  DESCRIPTION OF PROCEDURE:  The patient was reidentified in the preoperative hold area.  She received cefotetan 2 g IV for antibiotic prophylaxis.  She received  both TED hose and PAS stockings for DVT prophylaxis.  In the operating room, the patient was placed in the supine position and general endotracheal anesthesia was then induced.  She was placed in the dorsal lithotomy position.  The abdomen and vagina were then sterilely prepped and the patient was draped.  A Foley catheter was then sterilely placed inside the bladder.  The procedure began by creating a Pfannenstiel incision 3 cm above the pubic symphysis.  This was performed sharply with a scalpel.  The dissection through the subcutaneous tissue was performed with  monopolar cautery for hemostasis.  The fascia was then incised in the midline and the fascial incision was then extended bilaterally in an upward fashion. The soft abdominal wall mass was noted on the patient's right side as the fascial incision was created.  The rectus muscles were separated from the fascia both superiorly and inferiorly using sharp dissection and monopolar cautery of perforating blood vessels.  The parietal peritoneum was elevated with two hemostat clamps and was entered sharply with Metzenbaum scissors.  The peritoneal incision was extended cranially and caudally.  The bowel was packed into the upper abdomen with a moistened lap pad. An O'Connor-O'Sullivan retractor was placed in the pelvis and the bowel was packed in the upper abdomen using moistened lap pads.  Long Kelly clamps were placed across the adnexal structures bilaterally. The procedure began by using monopolar cautery to dissect the distal portion of the right fallopian tube from the mesosalpinx.  A Kelly clamp was placed across the thickened part of the mesosalpinx and the distal fallopian tube was excised.  The pedicle was then suture with 0 Vicryl. The specimen was set aside and later sent with the entire hysterectomy specimen and other fallopian tube.  Monopolar cautery was then used to cauterize the remaining mesosalpinx on the proximal portion.  The right round ligament was then grasped with a Babcock clamp and was suture ligated with a transfixing suture of 0 Vicryl.  The broad ligament was then opened anteriorly and posteriorly using monopolar cautery.  The ureter along the left pelvic sidewall was identified.  A window was created through the posterior leaf of the broad ligament on the patient's right-hand side using monopolar cautery.  The uteroovarian pedicle was then doubly clamped, sharply divided, and suture ligated with a free tie of 0 Vicryl followed by suture ligature the same.   The bladder flap was taken down anteriorly with a combination of monopolar cautery along the peritoneum and then sharp dissection with the Metzenbaum scissors.  The uterine artery on the right-hand side was skeletonized after the posterior peritoneum was taken down sharply.  The uterine artery on the right-hand side was then doubly clamped, sharply divided, and suture ligated twice with a suture of 0 Vicryl.  The same procedure that was performed on the patient's right-hand side was then repeated on the left-hand side including the isolation of the left fallopian tube, the left round ligament, the utero-ovarian ligament, dissection of the bladder, and the left uterine artery.  The left ureter was identified well on the patient's left-hand side.  The bladder flap was further taken down using sharp dissection and the inferior branches of the cardinal ligaments were then clamped with straight Heaney clamps, sharply divided, and then sutured with 0 Vicryl. Curved Heaney clamps were then placed across the uterosacral ligaments bilaterally.  These pedicles were sharply divided and the pedicles were then sutured with a transfixing suture of 0 Vicryl bilaterally.  The cervix  and uterus were then sharply circumscribed from the vaginal cuff using the Jorgenson scissors.  The entire specimen was set aside and sent to Pathology together along with a Filshie clip, which was noted to be floating freely in the posterior cul-de-sac.  The remaining vaginal cuff was closed with figure-of-eight sutures of 0 Vicryl.  Hemostasis was good.  The pelvis was irrigated and suctioned at this time.  There was some minor bleeding along the edge of the peritoneum along the bilateral pelvic sidewalls and these were gently cauterized by pulling the peritoneum away from the pelvic sidewall first to assure that the ureter was not in the region of the cautery energy.  This provided good hemostasis.  At this point, Dr.  Michael Robles was called to perform the excision of the preperitoneal abdominal wall mass.  Please refer to this dictation separately.  Hemostasis was good at the end of the dissection of the abdominal wall mass.  Mary Robles then assisted during the portion of the closure of the abdominal wall.  The lap pads and self retaining retractor were removed from the peritoneal cavity.  The parietal peritoneum was closed with a running suture of 2-0 Vicryl.  The rectus muscles were reapproximated in the midline using simple sutures of 0 Vicryl.  The fascia was then closed with a running suture of #1 PDS bilaterally and then the suture was tied in the midline from each side.  The subcutaneous layer was irrigated and suctioned, and made hemostatic with monopolar cautery.  Interrupted sutures of 2-0 plain suture were placed in the subcutaneous layer and the skin was closed with a subcuticular suture of 4-0 Vicryl.  Steri- Strips and benzoin were placed over the incision.  A sterile bandage was placed over the incision.  The patient was cleansed of any remaining prep solution.  The patient was awakened, extubated and escorted to the recovery room in stable condition.  There were no complications to the procedure.  All needle, instrument, and sponge counts were correct.     Mary Robles, M.D.     BES/MEDQ  D:  05/04/2014  T:  05/05/2014  Job:  [id number]

## 2014-05-05 NOTE — Progress Notes (Signed)
1 Day Post-Op Procedure(s) (LRB): HYSTERECTOMY ABDOMINAL with BILATERAL SALPINGECTOMY  (N/A) EXCISION LIPOMA of abdominal wall (N/A) REMOVAL OF ABD WALL MASS  (N/A)  Subjective: Patient reports tolerating PO and no problems voiding.   Off PCA.  Ambulating.   Objective: I have reviewed patient's vital signs and intake and output.  General: alert Resp: clear to auscultation bilaterally Cardio: regular rate and rhythm, S1, S2 normal, no murmur, click, rub or gallop GI: soft, non-tender; bowel sounds normal; no masses,  no organomegaly and incision: bloody drainage present Vaginal Bleeding: minimal  Assessment: s/p Procedure(s): HYSTERECTOMY ABDOMINAL with BILATERAL SALPINGECTOMY  (N/A) EXCISION LIPOMA of abdominal wall (N/A) REMOVAL OF ABD WALL MASS  (N/A): progressing well  Plan: Advance to PO medication Discontinue IV fluids Anticipate discharge tomorrow.    LOS: 1 day    [place] 05/05/2014, 7:57 AM

## 2014-05-06 LAB — CBC WITH DIFFERENTIAL/PLATELET
Basophils Absolute: 0 10*3/uL (ref 0.0–0.1)
Basophils Relative: 0 % (ref 0–1)
Eosinophils Absolute: 0.1 10*3/uL (ref 0.0–0.7)
Eosinophils Relative: 1 % (ref 0–5)
HEMATOCRIT: 32.6 % — AB (ref 36.0–46.0)
HEMOGLOBIN: 11.5 g/dL — AB (ref 12.0–15.0)
LYMPHS PCT: 35 % (ref 12–46)
Lymphs Abs: 3.3 10*3/uL (ref 0.7–4.0)
MCH: 27.7 pg (ref 26.0–34.0)
MCHC: 35.3 g/dL (ref 30.0–36.0)
MCV: 78.6 fL (ref 78.0–100.0)
MONO ABS: 1 10*3/uL (ref 0.1–1.0)
MONOS PCT: 10 % (ref 3–12)
NEUTROS ABS: 5.2 10*3/uL (ref 1.7–7.7)
Neutrophils Relative %: 54 % (ref 43–77)
Platelets: 233 10*3/uL (ref 150–400)
RBC: 4.15 MIL/uL (ref 3.87–5.11)
RDW: 14.8 % (ref 11.5–15.5)
WBC: 9.6 10*3/uL (ref 4.0–10.5)

## 2014-05-06 MED ORDER — OXYCODONE-ACETAMINOPHEN 5-325 MG PO TABS: 1.0000 | ORAL_TABLET | ORAL | Status: DC | PRN

## 2014-05-06 MED ORDER — IBUPROFEN 600 MG PO TABS: ORAL_TABLET | ORAL | Status: DC

## 2014-05-06 NOTE — Progress Notes (Signed)
Pt discharged home with husband... Condition stable... No equipment... Ambulated to car with Kathlynn Grate, NT.

## 2014-05-06 NOTE — Discharge Instructions (Signed)
ABDOMINAL SURGERY: POST OP INSTRUCTIONS ° °1. DIET: Follow a light bland diet the first 24 hours after arrival home, such as soup, liquids, crackers, etc.  Be sure to include lots of fluids daily.  Avoid fast food or heavy meals as your are more likely to get nauseated.  Eat a low fat the next few days after surgery.   °2. Take your usually prescribed home medications unless otherwise directed. °3. PAIN CONTROL: °a. Pain is best controlled by a usual combination of three different methods TOGETHER: °i. Ice/Heat °ii. Over the counter pain medication °iii. Prescription pain medication °b. Most patients will experience some swelling and bruising around the incisions.  Ice packs or heating pads (30-60 minutes up to 6 times a day) will help. Use ice for the first few days to help decrease swelling and bruising, then switch to heat to help relax tight/sore spots and speed recovery.  Some people prefer to use ice alone, heat alone, alternating between ice & heat.  Experiment to what works for you.  Swelling and bruising can take several weeks to resolve.   °c. It is helpful to take an over-the-counter pain medication regularly for the first few weeks.  Choose one of the following that works best for you: °i. Naproxen (Aleve, etc)  Two 220mg tabs twice a day °ii. Ibuprofen (Advil, etc) Three 200mg tabs four times a day (every meal & bedtime) °iii. Acetaminophen (Tylenol, etc) 500-650mg four times a day (every meal & bedtime) °d. A  prescription for pain medication (such as oxycodone, hydrocodone, etc) should be given to you upon discharge.  Take your pain medication as prescribed.  °i. If you are having problems/concerns with the prescription medicine (does not control pain, nausea, vomiting, rash, itching, etc), please call us [phone] to see if we need to switch you to a different pain medicine that will work better for you and/or control your side effect better. °ii. If you need a refill on your pain medication,  please contact your pharmacy.  They will contact our office to request authorization. Prescriptions will not be filled after 5 pm or on week-ends. °4. Avoid getting constipated.  Between the surgery and the pain medications, it is common to experience some constipation.  Increasing fluid intake and taking a fiber supplement (such as Metamucil, Citrucel, FiberCon, MiraLax, etc) 1-2 times a day regularly will usually help prevent this problem from occurring.  A mild laxative (prune juice, Milk of Magnesia, MiraLax, etc) should be taken according to package directions if there are no bowel movements after 48 hours.   °5. Watch out for diarrhea.  If you have many loose bowel movements, simplify your diet to bland foods & liquids for a few days.  Stop any stool softeners and decrease your fiber supplement.  Switching to mild anti-diarrheal medications (Kayopectate, Pepto Bismol) can help.  If this worsens or does not improve, please call us. °6. Wash / shower every day.  You may shower over the incision / wound.  Avoid baths until the skin is fully healed.  Continue to shower over incision(s) after the dressing is off. °7. Remove your waterproof bandages 5 days after surgery.  You may leave the incision open to air.  You may replace a dressing/Band-Aid to cover the incision for comfort if you wish. °8. ACTIVITIES as tolerated:   °a. You may resume regular (light) daily activities beginning the next day--such as daily self-care, walking, climbing stairs--gradually increasing activities as tolerated.  If you can   walk 30 minutes without difficulty, it is safe to try more intense activity such as jogging, treadmill, bicycling, low-impact aerobics, swimming, etc. b. Save the most intensive and strenuous activity for last such as sit-ups, heavy lifting, contact sports, etc  Refrain from any heavy lifting or straining until you are off narcotics for pain control.   c. DO NOT PUSH THROUGH PAIN.  Let pain be your guide: If it  hurts to do something, don't do it.  Pain is your body warning you to avoid that activity for another week until the pain goes down. d. You may drive when you are no longer taking prescription pain medication, you can comfortably wear a seatbelt, and you can safely maneuver your car and apply brakes. e. Dennis Bast may have sexual intercourse when it is comfortable.  9. FOLLOW UP in our office a. Please call [facility] at [phone] to set up an appointment to see your surgeon in the office for a follow-up appointment approximately 1-2 weeks after your surgery. b. Make sure that you call for this appointment the day you arrive home to insure a convenient appointment time. 10. IF YOU HAVE DISABILITY OR FAMILY LEAVE FORMS, BRING THEM TO THE OFFICE FOR PROCESSING.  DO NOT GIVE THEM TO YOUR DOCTOR.   WHEN TO CALL us [phone]: 1. Poor pain control 2. Reactions / problems with new medications (rash/itching, nausea, etc)  3. Fever over 101.5 F (38.5 C) 4. Inability to urinate 5. Nausea and/or vomiting 6. Worsening swelling or bruising 7. Continued bleeding from incision. 8. Increased pain, redness, or drainage from the incision  The clinic staff is available to answer your questions during regular business hours (8:30am-5pm).  Please dont hesitate to call and ask to speak to one of our nurses for clinical concerns.   A surgeon from [facility] is always on call at the hospitals   If you have a medical emergency, go to the nearest emergency room or call 911.    [facility], [place], [place], [place], [place]  [id number] ? MAIN: [phone] ? TOLL FREE: [phone] ? FAX [phone] [email or web address]  Lipoma A lipoma is a noncancerous (benign) tumor composed of fat cells. They are usually found under the skin (subcutaneous). A lipoma may occur in any tissue of the body that contains fat. Common areas for lipomas to appear include the  back, shoulders, buttocks, and thighs. Lipomas are a very common soft tissue growth. They are soft and grow slowly. Most problems caused by a lipoma depend on where it is growing. DIAGNOSIS  A lipoma can be diagnosed with a physical exam. These tumors rarely become cancerous, but radiographic studies can help determine this for certain. Studies used may include:  Computerized X-ray scans (CT or CAT scan).  Computerized magnetic scans (MRI). TREATMENT  Small lipomas that are not causing problems may be watched. If a lipoma continues to enlarge or causes problems, removal is often the best treatment. Lipomas can also be removed to improve appearance. Surgery is done to remove the fatty cells and the surrounding capsule. Most often, this is done with medicine that numbs the area (local anesthetic). The removed tissue is examined under a microscope to make sure it is not cancerous. Keep all follow-up appointments with your caregiver. SEEK MEDICAL CARE IF:   The lipoma becomes larger or hard.  The lipoma becomes painful, red, or increasingly swollen. These could be signs of infection or a more serious condition.  Document Released: 11/23/2002 Document Revised: 02/25/2012 Document Reviewed: 05/05/2010 [facility] Patient Information 2014 [place], [place].  Hysterectomy Care After Refer to this sheet in the next few weeks. These instructions provide you with information on caring for yourself after your procedure. Your caregiver may also give you more specific instructions. Your treatment has been planned according to current medical practices, but problems sometimes occur. Call your caregiver if you have any problems or questions after your procedure. HOME CARE INSTRUCTIONS  Healing will take time. You may have discomfort, tenderness, swelling, and bruising at the surgical site for about 2 weeks. This is normal and will get better as time goes on.  Only take over-the-counter or prescription medicines for  pain, discomfort, or fever as directed by your caregiver.  Do not take aspirin. It can cause bleeding.  Do not drive when taking pain medicine.  Follow your caregiver's advice regarding exercise, lifting, driving, and general activities.  Resume your usual diet as directed and allowed.  Get plenty of rest and sleep.  Do not douche, use tampons, or have sexual intercourse for at least 6 weeks or until your caregiver gives you permission.  Change your bandages (dressings) as directed by your caregiver.  Monitor your temperature.  Take showers instead of baths for 2 to 3 weeks.  Do not drink alcohol until your caregiver gives you permission.  If you are constipated, you may take a mild laxative with your caregiver's permission. Bran foods may help with constipation problems. Drinking enough fluids to keep your urine clear or pale yellow may help as well.  Try to have someone home with you for 1 or 2 weeks to help around the house.  Keep all of your follow-up appointments as directed by your caregiver. SEEK MEDICAL CARE IF:   You have swelling, redness, or increasing pain in the surgical cut (incision) area.  You have pus coming from the incision.  You notice a bad smell coming from the incision or dressing.  You have swelling, redness, or pain around the intravenous (IV) site.  Your incision breaks open.  You feel dizzy or lightheaded.  You have pain or bleeding when you urinate.  You have persistent diarrhea.  You have persistent nausea and vomiting.  You have abnormal vaginal discharge.  You have a rash.  You have any type of abnormal reaction or develop an allergy to your medicine.  Your pain is not controlled with your prescribed medicine. SEEK IMMEDIATE MEDICAL CARE IF:   You have a fever.  You have severe abdominal pain.  You have chest pain.  You have shortness of breath.  You faint.  You have pain, swelling, or redness of your leg.  You have  heavy vaginal bleeding with blood clots. MAKE SURE YOU:  Understand these instructions.  Will watch your condition.  Will get help right away if you are not doing well or get worse. Document Released: 06/22/2005 Document Revised: 02/25/2012 Document Reviewed: 07/20/2011 [facility] Patient Information 2014 [place].

## 2014-05-06 NOTE — Progress Notes (Signed)
2 Days Post-Op Procedure(s) (LRB): HYSTERECTOMY ABDOMINAL with BILATERAL SALPINGECTOMY  (N/A) EXCISION LIPOMA of abdominal wall (N/A) REMOVAL OF ABD WALL MASS  (N/A)  Subjective: Patient reports tolerating PO, + flatus and no problems voiding.   Some congestion.  No wheezing.   Objective: I have reviewed patient's vital signs, intake and output and pathology.  General: alert Resp: clear to auscultation bilaterally Cardio: regular rate and rhythm, S1, S2 normal, no murmur, click, rub or gallop GI: soft, non-tender; bowel sounds normal; no masses,  no organomegaly and incision: clean, dry and intact  Path report - benign lipoma, uterine fibroids, benign tubes and cervix.   Assessment: s/p Procedure(s): HYSTERECTOMY ABDOMINAL with BILATERAL SALPINGECTOMY  (N/A) EXCISION LIPOMA of abdominal wall (N/A) REMOVAL OF ABD WALL MASS  (N/A): progressing well  Plan: Discharge home Follow up next week.  Instructions and precautions given.  Rx for Percocet and Ibuprofen.   LOS: 2 days    [place] 05/06/2014, 6:31 AM

## 2014-05-12 ENCOUNTER — Ambulatory Visit (INDEPENDENT_AMBULATORY_CARE_PROVIDER_SITE_OTHER): Payer: BC Managed Care – PPO | Admitting: Obstetrics and Gynecology

## 2014-05-12 ENCOUNTER — Encounter: Payer: Self-pay | Admitting: Obstetrics and Gynecology

## 2014-05-12 VITALS — BP 120/74 | HR 84 | Temp 98.8°F | Ht 65.0 in | Wt 176.0 lb

## 2014-05-12 DIAGNOSIS — Z9889 Other specified postprocedural states: Secondary | ICD-10-CM

## 2014-05-12 NOTE — Patient Instructions (Signed)
See your primary care physician for carpal tunnel treatment with wrist splints.

## 2014-05-12 NOTE — Progress Notes (Signed)
Patient ID: Mary Robles, female   DOB: 01-Jun-1976, 38 y.o.   MRN: [id number]  GYNECOLOGY  VISIT   PCP:  [facility]  HPI: 38 y.o.   Married  [place] American  female   [phone] with Patient's last menstrual period was 04/17/2014.   here for incision check.  Feeling cold and hot.   Right arm is cold since surgery.  Some tingling in both hands.  Improving since surgery, but this happened for the patient even before surgery.  Able to move it OK and has good strength.  Patient does key boarding most of the day.   Bladder and bowel working well.  Bleeding stopped.  Does not like percocet.  None for 4 days.  Ibuprofen helping.  GYNECOLOGIC HISTORY: Patient's last menstrual period was 04/17/2014. Contraception:    Menopausal hormone therapy:         OB History   Grav Para Term Preterm Abortions TAB SAB Ect Mult Living   4 2   1 1    2          Patient Active Problem List   Diagnosis Date Noted  . Status post total abdominal hysterectomy 05/04/2014  . Abdominal wall mass of right lower quadrant, 11cm, probable lipoma 02/18/2014  . Fibroid uterus 02/18/2014  . Hypertension   . Vitamin D deficiency     Past Medical History  Diagnosis Date  . Herpes simplex   . Sickle cell trait   . Anxiety   . Hypertension   . Hyperlysinemia   . Vitamin D deficiency   . Depression   . Asthma     no symptoms in "years", no inhaler    Past Surgical History  Procedure Laterality Date  . Tubal ligation    . Foot surgery    . Abdominal hysterectomy N/A 05/04/2014    Procedure: HYSTERECTOMY ABDOMINAL with BILATERAL SALPINGECTOMY ;  Surgeon: Everardo All Amundson de Berton Lan, MD;  Location: [facility];  Service: Gynecology;  Laterality: N/A;  . Lipoma excision N/A 05/04/2014    Procedure: EXCISION LIPOMA of abdominal wall;  Surgeon: Jamey Reas de Berton Lan, MD;  Location: [facility];  Service: Gynecology;  Laterality: N/A;  . Laparotomy N/A 05/04/2014    Procedure:  REMOVAL OF ABD WALL MASS ;  Surgeon: Adin Hector, MD;  Location: [facility];  Service: General;  Laterality: N/A;    Current Outpatient Prescriptions  Medication Sig Dispense Refill  . amLODipine (NORVASC) 5 MG tablet TAKE ONE TABLET BY MOUTH ONCE DAILY  30 tablet  5  . benazepril (LOTENSIN) 20 MG tablet TAKE ONE TABLET BY MOUTH ONCE DAILY  30 tablet  10  . cloNIDine (CATAPRES) 0.1 MG tablet Take 0.1 mg by mouth 2 (two) times daily.      Marland Kitchen ibuprofen (ADVIL,MOTRIN) 600 MG tablet Take one by mouth every 5 hours as needed for pain.  30 tablet  0  . loratadine (CLARITIN) 10 MG tablet Take 10 mg by mouth daily.      . pravastatin (PRAVACHOL) 40 MG tablet TAKE ONE TABLET BY MOUTH ONCE DAILY  30 tablet  5  . oxyCODONE-acetaminophen (PERCOCET/ROXICET) 5-325 MG per tablet Take 1-2 tablets by mouth every 4 (four) hours as needed for severe pain (moderate to severe pain (when tolerating fluids)).  30 tablet  0   No current facility-administered medications for this visit.     ALLERGIES: Vicodin and Betadine  Family History  Problem Relation Age of Onset  .  Diabetes Mother   . Thyroid disease Mother   . Mental retardation Maternal Aunt   . Diabetes Maternal Grandmother     History   Social History  . Marital Status: Married    Spouse Name: N/A    Number of Children: N/A  . Years of Education: N/A   Occupational History  . Not on file.   Social History Main Topics  . Smoking status: Never Smoker   . Smokeless tobacco: Never Used  . Alcohol Use: 0.5 oz/week    1 drink(s) per week     Comment: occ glass of wine  . Drug Use: No  . Sexual Activity: Yes    Partners: Male    Birth Control/ Protection: Surgical     Comment: BTSP 2005   Other Topics Concern  . Not on file   Social History Narrative  . No narrative on file    ROS:  Pertinent items are noted in HPI.  PHYSICAL EXAMINATION:    BP 120/74  Pulse 84  Temp(Src) 98.8 F (37.1 C) (Oral)  Ht 5\' 5"  (1.651 m)  Wt 176  lb (79.833 kg)  BMI 29.29 kg/m2  LMP 04/17/2014     General appearance: alert, cooperative and appears stated age Abdomen: Incision intact with steristrips on, soft, non-tender; no masses,  no organomegaly Neuro:  Sensation intact of bilateral upper extremities.  Strength 5/5 throughout.  Temperature equal to touch.   ASSESSMENT Status post total abdominal hysterectomy with bilateral salpingectomy and excision of abdominal wall lipoma. Doing well post op.   Possible carpal tunnel syndrome.   PLAN  Continue decreased activity for 6 weeks post op. Return for next visit in about 5 weeks.  See PCP for carpal tunnel evaluation/wrist splints.    An After Visit Summary was printed and given to the patient.  ___10___ minutes face to face time of which over 50% was spent in counseling.

## 2014-05-16 NOTE — Discharge Summary (Signed)
Physician Discharge Summary  Patient ID: Mary Robles MRN: [id number] DOB/AGE: [birth year] 38 y.o.  Admit date: 05/04/2014 Discharge date: 05/06/14  Admission Diagnoses:  Symptomatic uterine fibroids, pelvic pain, abdominal wall mass.  Discharge Diagnoses:  1.  Symptomatic uterine fibroids, pelvic pain, abdominal wall mass. 2.  Status post total abdominal hysterectomy with bilateral salpingectomy, excision of preperitoneal abdominal wall mass.  Active Problems:   Status post total abdominal hysterectomy   Discharged Condition: good  Hospital Course:  The patient was admitted on 05/04/14 for a total abdominal hysterectomy with bilateral salpingectomy under the direction of Dr. [name] and excision of preperitoneal abdominal wall mass under the direction of Dr. Michael Boston, both of which were performed without complication while under general anesthesia.  The patient's post op course was uneventful.  She had a morphine PCA and Toradol for pain control initially, and this was converted over to Percocet and Motrin on post op day one when the patient began taking po well.  She ambulated independently and wore PAS and Ted hose for DVT prophylaxis while in bed.  Her foley catheter were removed on post op day one, and she voided well. The patient's vital signs remained stable and she demonstrated no signs of infection during her hospitalization.  The patient's post op day two Hgb was 11.5, and she was tolerating this well.  Her WBC on post op day number one was 15.8, and this decreased to 9.6 with a normal differential on post op day number two.  She was found to be in good condition and ready for discharge on post op day one.      Consults: None  Significant Diagnostic Studies: labs:  See HPI.  Pathology report - benign lipoma, uterine fibroids, benign tubes and cervix  Treatments:  Surgery - Total abdominal hysterectomy with bilateral salpingectomy, excision of preperitoneal abdominal wall  mass on 05/04/14.  Discharge Exam: Blood pressure 118/65, pulse 82, temperature 97.8 F (36.6 C), temperature source Oral, resp. rate 18, height 5\' 5"  (1.651 m), weight 178 lb (80.74 kg), SpO2 98.00%. General appearance: alert Cardio: regular rate and rhythm, S1, S2 normal, no murmur, click, rub or gallop GI: soft, non-tender; bowel sounds normal; no masses,  no organomegaly Incision:  Clean, dry, and intact.   Disposition: 01-Home or Self Care     Medication List         amLODipine 5 MG tablet  Commonly known as:  NORVASC  TAKE ONE TABLET BY MOUTH ONCE DAILY     benazepril 20 MG tablet  Commonly known as:  LOTENSIN  TAKE ONE TABLET BY MOUTH ONCE DAILY     cloNIDine 0.1 MG tablet  Commonly known as:  CATAPRES  Take 0.1 mg by mouth 2 (two) times daily.     ibuprofen 600 MG tablet  Commonly known as:  ADVIL,MOTRIN  Take one by mouth every 5 hours as needed for pain.     loratadine 10 MG tablet  Commonly known as:  CLARITIN  Take 10 mg by mouth daily.     oxyCODONE-acetaminophen 5-325 MG per tablet  Commonly known as:  PERCOCET/ROXICET  Take 1-2 tablets by mouth every 4 (four) hours as needed for severe pain (moderate to severe pain (when tolerating fluids)).     pravastatin 40 MG tablet  Commonly known as:  PRAVACHOL  TAKE ONE TABLET BY MOUTH ONCE DAILY           Follow-up Information   Follow up with Adin Hector., MD In  3 weeks. (To follow up after your operation)    Specialty:  General Surgery   Contact information:   [place] [id number] [phone]       Follow up with Amundson de Berton Lan, MD In 1 week.   Specialty:  Obstetrics and Gynecology   Contact information:   [address] [place] [address] [phone]       Signed: Odelia Gage 05/16/2014, 5:54 PM

## 2014-05-19 ENCOUNTER — Ambulatory Visit (INDEPENDENT_AMBULATORY_CARE_PROVIDER_SITE_OTHER): Payer: BC Managed Care – PPO | Admitting: Surgery

## 2014-05-19 ENCOUNTER — Encounter (INDEPENDENT_AMBULATORY_CARE_PROVIDER_SITE_OTHER): Payer: Self-pay | Admitting: Surgery

## 2014-05-19 VITALS — BP 122/80 | HR 80 | Temp 97.0°F | Ht 66.0 in | Wt 173.0 lb

## 2014-05-19 DIAGNOSIS — D171 Benign lipomatous neoplasm of skin and subcutaneous tissue of trunk: Secondary | ICD-10-CM | POA: Insufficient documentation

## 2014-05-19 DIAGNOSIS — D1739 Benign lipomatous neoplasm of skin and subcutaneous tissue of other sites: Secondary | ICD-10-CM

## 2014-05-19 HISTORY — DX: Benign lipomatous neoplasm of skin and subcutaneous tissue of trunk: D17.1

## 2014-05-19 NOTE — Progress Notes (Signed)
Subjective:     Patient ID: Mary Robles, female   DOB: March 09, 1976, 38 y.o.   MRN: [id number]  HPI  Note: This dictation was prepared with Dragon/digital dictation along with [facility] technology. Any transcriptional errors that result from this process are unintentional.       Daveah Varone  1976-04-20 [id number]  Patient Care Team: Susy Frizzle, MD as PCP - General (Family Medicine) Jamey Reas de Berton Lan, MD as Consulting Physician (Obstetrics and Gynecology)  Procedure (Date: 05/04/2014):  POST-OPERATIVE DIAGNOSIS:  ABDOMINAL WALL MASS (subfascial)   PROCEDURE: Procedure(s):  REMOVAL OF ABD WALL MASS  SURGEON: Surgeon(s):  Adin Hector, MD  Everardo All Amundson de Berton Lan, MD - Asst   Diagnosis 1. Uterus and bilateral fallopian tubes, with cervix - PROLIFERATIVE PATTERN ENDOMETRIUM WITH UNDERLYING BENIGN MYOMETRIUM DEMONSTRATING LEIOMYOMA FORMATION. - BENIGN CERVIX. - BENIGN BILATERAL FALLOPIAN TUBES. - NO DYSPLASIA, ATYPIA, HYPERPLASIA OR MALIGNANCY IDENTIFIED WITHIN THE SPECIMEN. - SINGLE SILVER METALLIC CLIP FOUND EMBEDDED IN THE SEROSA GROSSLY CONSISTENT WITH FILSHIE CLIP. 2. Soft tissue mass, simple excision, fatty abdominal wall mass - MATURE ADIPOSE TISSUE CONSISTENT WITH LIPOMA. - NO ATYPIA OR MALIGNANCY IDENTIFIED. Willeen Niece MD Pathologist, Electronic Signature (Case signed 05/05/2014) Specimen Francella Barnett and Clinical Information Specimen(s) Obtained: 1. Uterus and bilateral fallopian tubes, with cervix 2. Soft tissue mass, simple excision, fatty abdominal wall mass Specimen Clinical Information 1. symptomatic uterine fibroids, pelvic pain, abdominal wall lipoma pre-peritoneal abdominal wall mass (kp) 2. questionable lipoma Savahanna Almendariz 1. Specimen: Uterus with attached segments of bilateral fallopian tubes, separate segments of fallopian tubes and a silver metallic clip consistent with a clinically stated Filshie clip. The  specimen is received in fixative. Specimen integrity (intact/incised/disrupted): Intact. Size and shape: 9.8 x 6.2 x 5.3 cm asymmetrical uterus. Weight: The uterus weighs 155 grams. Serosa: Tan to hyperemic with a silver metallic clip embedded in the serosa on the posterior surface of the dome. The clip is consistent with a Filshie clip. Cervix: The ectocervical mucosa is smooth, tan and the external os is patent measuring 0.7 cm. The 1 of 2 FINAL for BROADNAX, Tiny ([id number]) Kam Kushnir(continued) endocervical mucosa is glistening tan pink. Endometrium: The endometrial cavity measures 5.3 x 3.5 cm the endometrium is glistening, tan red and measures up to 0.4 cm in thickness. Myometrium: The myometrium measures up to 2.8 cm in thickness. There are seven intramural and submucosal leiomyomata each varying in size from 0.7 to 2.5 cm in greatest dimension. Fallopian tubes: The attached segments of fallopian tube each have a blunted end with the right measuring 3 x 0.6 cm and left 2 x 0.6 cm. The separate fimbriated segments of tube are each approximately the same size measuring 4 cm in length x 0.6 cm in diameter. Block Summary: 10 blocks submitted. A= cervix. B,C= endomyometrium. D,E= leiomyomata. F= sections of serosa from area of embedded clip. G= sections of attached right tube. H= sections of attached left tube. I= sections from one separate section of tube to include fimbria. J= sections from second segment of separate tube to include fimbria. (GP:gt, 05/04/14) 2. Received in formalin is an 11 x 9.3 x 2.9 cm rubbery, yellow, lobular lipomatous mass. The cut surface is glistening and homogeneous. Sections are submitted in one cassette. (GRP:ecj 05/04/2014) Report signed out from the following location(s) Technical Component performed at [facility], [place], [place] [address]. CLIA#: [id number], Interpretation performed at [facility] [place],  [place], [place] [address]. CLIA #: [id number], 2 of   This patient returns for surgical re-evaluation.  She is doing relatively well.  Eating okay.  Soreness going down.  Urinating fine.  Bowel movements moving well.  No fever chills or sweats.  Followed up with her gynecologist.  Patient Active Problem List   Diagnosis Date Noted  . Lipoma of abdominal wall s/p excision 05/04/2014 05/19/2014  . Status post total abdominal hysterectomy 05/04/2014  . Hypertension   . Vitamin D deficiency     Past Medical History  Diagnosis Date  . Herpes simplex   . Sickle cell trait   . Anxiety   . Hypertension   . Hyperlysinemia   . Vitamin D deficiency   . Depression   . Asthma     no symptoms in "years", no inhaler    Past Surgical History  Procedure Laterality Date  . Tubal ligation    . Foot surgery    . Abdominal hysterectomy N/A 05/04/2014    Procedure: HYSTERECTOMY ABDOMINAL with BILATERAL SALPINGECTOMY ;  Surgeon: Everardo All Amundson de Berton Lan, MD;  Location: [facility];  Service: Gynecology;  Laterality: N/A;  . Lipoma excision N/A 05/04/2014    Procedure: EXCISION LIPOMA of abdominal wall;  Surgeon: Jamey Reas de Berton Lan, MD;  Location: [facility];  Service: Gynecology;  Laterality: N/A;  . Laparotomy N/A 05/04/2014    Procedure: REMOVAL OF ABD WALL MASS ;  Surgeon: Adin Hector, MD;  Location: [facility];  Service: General;  Laterality: N/A;    History   Social History  . Marital Status: Married    Spouse Name: N/A    Number of Children: N/A  . Years of Education: N/A   Occupational History  . Not on file.   Social History Main Topics  . Smoking status: Never Smoker   . Smokeless tobacco: Never Used  . Alcohol Use: 0.5 oz/week    1 drink(s) per week     Comment: occ glass of wine  . Drug Use: No  . Sexual Activity: Yes    Partners: Male    Birth Control/ Protection: Surgical     Comment: BTSP 2005   Other Topics Concern  . Not  on file   Social History Narrative  . No narrative on file    Family History  Problem Relation Age of Onset  . Diabetes Mother   . Thyroid disease Mother   . Mental retardation Maternal Aunt   . Diabetes Maternal Grandmother     Current Outpatient Prescriptions  Medication Sig Dispense Refill  . amLODipine (NORVASC) 5 MG tablet TAKE ONE TABLET BY MOUTH ONCE DAILY  30 tablet  5  . benazepril (LOTENSIN) 20 MG tablet TAKE ONE TABLET BY MOUTH ONCE DAILY  30 tablet  10  . cloNIDine (CATAPRES) 0.1 MG tablet Take 0.1 mg by mouth 2 (two) times daily.      Marland Kitchen ibuprofen (ADVIL,MOTRIN) 600 MG tablet Take one by mouth every 5 hours as needed for pain.  30 tablet  0  . loratadine (CLARITIN) 10 MG tablet Take 10 mg by mouth daily.      . pravastatin (PRAVACHOL) 40 MG tablet TAKE ONE TABLET BY MOUTH ONCE DAILY  30 tablet  5   No current facility-administered medications for this visit.     Allergies  Allergen Reactions  . Vicodin [Hydrocodone-Acetaminophen] Other (See Comments)    Hallucination  . Betadine [Povidone Iodine]     Reaction:Blisters  BP 122/80  Pulse 80  Temp(Src) 97 F (36.1 C)  Ht 5\' 6"  (1.676 m)  Wt 173 lb (78.472 kg)  BMI 27.94 kg/m2  LMP 04/17/2014  No results found.   Review of Systems  Constitutional: Negative for fever, chills and diaphoresis.  HENT: Negative for ear pain, sore throat and trouble swallowing.   Eyes: Negative for photophobia and visual disturbance.  Respiratory: Negative for cough and choking.   Cardiovascular: Negative for chest pain and palpitations.  Gastrointestinal: Negative for nausea, vomiting, abdominal pain, diarrhea, constipation, anal bleeding and rectal pain.  Genitourinary: Negative for dysuria, frequency and difficulty urinating.  Musculoskeletal: Negative for gait problem and myalgias.  Skin: Negative for color change, pallor and rash.  Neurological: Negative for dizziness, speech difficulty, weakness and numbness.    Hematological: Negative for adenopathy.  Psychiatric/Behavioral: Negative for confusion and agitation. The patient is not nervous/anxious.        Objective:   Physical Exam  Constitutional: She is oriented to person, place, and time. She appears well-developed and well-nourished. No distress.  HENT:  Head: Normocephalic.  Mouth/Throat: Oropharynx is clear and moist. No oropharyngeal exudate.  Eyes: Conjunctivae and EOM are normal. Pupils are equal, round, and reactive to light. No scleral icterus.  Neck: Normal range of motion. No tracheal deviation present.  Cardiovascular: Normal rate and intact distal pulses.   Pulmonary/Chest: Effort normal. No respiratory distress. She exhibits no tenderness.  Abdominal: Soft. She exhibits no distension. There is no tenderness. No hernia. Hernia confirmed negative in the ventral area, confirmed negative in the right inguinal area and confirmed negative in the left inguinal area.    Incisions clean with normal healing ridges.  No hernias  Genitourinary: No vaginal discharge found.  Musculoskeletal: Normal range of motion. She exhibits no tenderness.  Lymphadenopathy:       Right: No inguinal adenopathy present.       Left: No inguinal adenopathy present.  Neurological: She is alert and oriented to person, place, and time. No cranial nerve deficit. She exhibits normal muscle tone. Coordination normal.  Skin: Skin is warm and dry. No rash noted. She is not diaphoretic.  Psychiatric: She has a normal mood and affect. Her behavior is normal.       Assessment:     Recovering well status post hysterectomy, bilateral salpingo-oophorectomy, removal of large right flank abdominal wall lipoma     Plan:     I am glad that the pathology is benign.  We gave a copy of the report to her.  Increase activity as tolerated to regular activity.  Low impact exercise such as walking an hour a day at least ideal.  Do not push through pain.  Diet as tolerated.   Low fat high fiber diet ideal.  Bowel regimen with 30 g fiber a day and fiber supplement as needed to avoid problems.  Return to clinic as needed.   Instructions discussed.  Followup with primary care physician for other health issues as would normally be done.  Consider screening for malignancies (breast, prostate, colon, melanoma, etc) as appropriate.  Questions answered.  The patient expressed understanding and appreciation

## 2014-05-19 NOTE — Patient Instructions (Signed)
GENERAL SURGERY: POST OP INSTRUCTIONS  1. DIET: Follow a light bland diet the first 24 hours after arrival home, such as soup, liquids, crackers, etc.  Be sure to include lots of fluids daily.  Avoid fast food or heavy meals as your are more likely to get nauseated.   2. Take your usually prescribed home medications unless otherwise directed. 3. PAIN CONTROL: a. Pain is best controlled by a usual combination of three different methods TOGETHER: i. Ice/Heat ii. Over the counter pain medication iii. Prescription pain medication b. Most patients will experience some swelling and bruising around the incisions.  Ice packs or heating pads (30-60 minutes up to 6 times a day) will help. Use ice for the first few days to help decrease swelling and bruising, then switch to heat to help relax tight/sore spots and speed recovery.  Some people prefer to use ice alone, heat alone, alternating between ice & heat.  Experiment to what works for you.  Swelling and bruising can take several weeks to resolve.   c. It is helpful to take an over-the-counter pain medication regularly for the first few weeks.  Choose one of the following that works best for you: i. Naproxen (Aleve, etc)  Two 274m tabs twice a day ii. Ibuprofen (Advil, etc) Three 2085mtabs four times a day (every meal & bedtime) iii. Acetaminophen (Tylenol, etc) 500-65021mour times a day (every meal & bedtime) d. A  prescription for pain medication (such as oxycodone, hydrocodone, etc) should be given to you upon discharge.  Take your pain medication as prescribed.  i. If you are having problems/concerns with the prescription medicine (does not control pain, nausea, vomiting, rash, itching, etc), please call us [phone] see if we need to switch you to a different pain medicine that will work better for you and/or control your side effect better. ii. If you need a refill on your pain medication, please contact your pharmacy.  They will contact our  office to request authorization. Prescriptions will not be filled after 5 pm or on week-ends. 4. Avoid getting constipated.  Between the surgery and the pain medications, it is common to experience some constipation.  Increasing fluid intake and taking a fiber supplement (such as Metamucil, Citrucel, FiberCon, MiraLax, etc) 1-2 times a day regularly will usually help prevent this problem from occurring.  A mild laxative (prune juice, Milk of Magnesia, MiraLax, etc) should be taken according to package directions if there are no bowel movements after 48 hours.   5. Wash / shower every day.  You may shower over the dressings as they are waterproof.  Continue to shower over incision(s) after the dressing is off. 6. Remove your waterproof bandages 5 days after surgery.  You may leave the incision open to air.  You may have skin tapes (Steri Strips) covering the incision(s).  Leave them on until one week, then remove.  You may replace a dressing/Band-Aid to cover the incision for comfort if you wish.      7. ACTIVITIES as tolerated:   a. You may resume regular (light) daily activities beginning the next day-such as daily self-care, walking, climbing stairs-gradually increasing activities as tolerated.  If you can walk 30 minutes without difficulty, it is safe to try more intense activity such as jogging, treadmill, bicycling, low-impact aerobics, swimming, etc. b. Save the most intensive and strenuous activity for last such as sit-ups, heavy lifting, contact sports, etc  Refrain from any heavy lifting or straining until you  are off narcotics for pain control.   c. DO NOT PUSH THROUGH PAIN.  Let pain be your guide: If it hurts to do something, don't do it.  Pain is your body warning you to avoid that activity for another week until the pain goes down. d. You may drive when you are no longer taking prescription pain medication, you can comfortably wear a seatbelt, and you can safely maneuver your car and apply  brakes. e. Dennis Bast may have sexual intercourse when it is comfortable.  8. FOLLOW UP in our office a. Please call [facility] at [phone] to set up an appointment to see your surgeon in the office for a follow-up appointment approximately 2-3 weeks after your surgery. b. Make sure that you call for this appointment the day you arrive home to insure a convenient appointment time. 9. IF YOU HAVE DISABILITY OR FAMILY LEAVE FORMS, BRING THEM TO THE OFFICE FOR PROCESSING.  DO NOT GIVE THEM TO YOUR DOCTOR.   WHEN TO CALL us [phone]: 1. Poor pain control 2. Reactions / problems with new medications (rash/itching, nausea, etc)  3. Fever over 101.5 F (38.5 C) 4. Worsening swelling or bruising 5. Continued bleeding from incision. 6. Increased pain, redness, or drainage from the incision 7. Difficulty breathing / swallowing   The clinic staff is available to answer your questions during regular business hours (8:30am-5pm).  Please don't hesitate to call and ask to speak to one of our nurses for clinical concerns.   If you have a medical emergency, go to the nearest emergency room or call 911.  A surgeon from [facility] is always on call at the [facility], [place], [place], [place], [place]  [address] ? MAIN: [phone] ? TOLL FREE: [phone] ?  FAX [phone] [email or web address]  Lipoma A lipoma is a noncancerous (benign) tumor composed of fat cells. They are usually found under the skin (subcutaneous). A lipoma may occur in any tissue of the body that contains fat. Common areas for lipomas to appear include the back, shoulders, buttocks, and thighs. Lipomas are a very common soft tissue growth. They are soft and grow slowly. Most problems caused by a lipoma depend on where it is growing. DIAGNOSIS  A lipoma can be diagnosed with a physical exam. These tumors rarely become cancerous, but radiographic studies can help  determine this for certain. Studies used may include:  Computerized X-ray scans (CT or CAT scan).  Computerized magnetic scans (MRI). TREATMENT  Small lipomas that are not causing problems may be watched. If a lipoma continues to enlarge or causes problems, removal is often the best treatment. Lipomas can also be removed to improve appearance. Surgery is done to remove the fatty cells and the surrounding capsule. Most often, this is done with medicine that numbs the area (local anesthetic). The removed tissue is examined under a microscope to make sure it is not cancerous. Keep all follow-up appointments with your caregiver. SEEK MEDICAL CARE IF:   The lipoma becomes larger or hard.  The lipoma becomes painful, red, or increasingly swollen. These could be signs of infection or a more serious condition. Document Released: 11/23/2002 Document Revised: 02/25/2012 Document Reviewed: 05/05/2010 [facility] Patient Information 2014 [place], [place].

## 2014-05-28 DIAGNOSIS — Z0289 Encounter for other administrative examinations: Secondary | ICD-10-CM

## 2014-05-31 ENCOUNTER — Encounter: Payer: Self-pay | Admitting: Obstetrics and Gynecology

## 2014-05-31 ENCOUNTER — Ambulatory Visit (INDEPENDENT_AMBULATORY_CARE_PROVIDER_SITE_OTHER): Payer: BC Managed Care – PPO | Admitting: Obstetrics and Gynecology

## 2014-05-31 ENCOUNTER — Telehealth: Payer: Self-pay | Admitting: Obstetrics and Gynecology

## 2014-05-31 VITALS — BP 100/68 | HR 80 | Temp 98.1°F | Ht 65.0 in | Wt 173.8 lb

## 2014-05-31 DIAGNOSIS — M549 Dorsalgia, unspecified: Secondary | ICD-10-CM

## 2014-05-31 DIAGNOSIS — R300 Dysuria: Secondary | ICD-10-CM

## 2014-05-31 DIAGNOSIS — R319 Hematuria, unspecified: Secondary | ICD-10-CM

## 2014-05-31 LAB — POCT URINALYSIS DIPSTICK
BILIRUBIN UA: NEGATIVE
GLUCOSE UA: NEGATIVE
Ketones, UA: NEGATIVE
Leukocytes, UA: NEGATIVE
Nitrite, UA: NEGATIVE
Protein, UA: 5
Urobilinogen, UA: NEGATIVE

## 2014-05-31 MED ORDER — CIPROFLOXACIN HCL 500 MG PO TABS: 500.0000 mg | ORAL_TABLET | Freq: Two times a day (BID) | ORAL | Status: DC

## 2014-05-31 NOTE — Telephone Encounter (Signed)
Returning a call to Kaitlyn. °

## 2014-05-31 NOTE — Telephone Encounter (Signed)
I can see the patient at 12:30 today.

## 2014-05-31 NOTE — Progress Notes (Signed)
Patient ID: Mary Robles, female   DOB: [birth year], 38 y.o.   MRN: [id number] GYNECOLOGY VISIT  PCP:   Jenna Luo, MD  Referring provider:   HPI: 38 y.o.   Married  [place] American  female   [phone] with Patient's last menstrual period was 04/17/2014.   here for  Back pain and abdominal discomfort.  Did some light house cleaning three days ago.  Some lower abdominal tightness and lower back pain.  Denies loss of sensation.  No bleeding.  Some nausea the day after had pain, but resolved quickly. No fevers.  Some burning with urination for two weeks.   Status post total abdominal hysterectomy with bilateral salpingectomy and excision of right abdominal wall lipoma on 05/04/14.  Urine: Trace RBC's   GYNECOLOGIC HISTORY: Patient's last menstrual period was 04/17/2014. Sexually active:  Not currently Partner preference: female Contraception: Tubal, Hysterectomy   Menopausal hormone therapy: n/a DES exposure:  no  Blood transfusions:  no  Sexually transmitted diseases:  HSV  GYN procedures and prior surgeries: Tubal, ? Cryo age 2 or 44, TAH/BSO  Last mammogram:  2012  normal             Last pap and high risk HPV testing:  01-21-14 wnl:neg HR HPV  History of abnormal pap smear:  Yes, age 69 or 1 (dysplasia)--hx of ?cryotherapy   OB History   Grav Para Term Preterm Abortions TAB SAB Ect Mult Living   4 2   1 1    2        LIFESTYLE: Exercise:               Tobacco: no Alcohol:   1 drink per week Drug use:  no  Patient Active Problem List   Diagnosis Date Noted  . Lipoma of abdominal wall s/p excision 05/04/2014 05/19/2014  . Status post total abdominal hysterectomy 05/04/2014  . Hypertension   . Vitamin D deficiency     Past Medical History  Diagnosis Date  . Herpes simplex   . Sickle cell trait   . Anxiety   . Hypertension   . Hyperlysinemia   . Vitamin D deficiency   . Depression   . Asthma     no symptoms in "years", no inhaler    Past Surgical  History  Procedure Laterality Date  . Tubal ligation    . Foot surgery    . Abdominal hysterectomy N/A 05/04/2014    Procedure: HYSTERECTOMY ABDOMINAL with BILATERAL SALPINGECTOMY ;  Surgeon: Everardo All Amundson de Berton Lan, MD;  Location: [facility];  Service: Gynecology;  Laterality: N/A;  . Lipoma excision N/A 05/04/2014    Procedure: EXCISION LIPOMA of abdominal wall;  Surgeon: Jamey Reas de Berton Lan, MD;  Location: [facility];  Service: Gynecology;  Laterality: N/A;  . Laparotomy N/A 05/04/2014    Procedure: REMOVAL OF ABD WALL MASS ;  Surgeon: Adin Hector, MD;  Location: [facility];  Service: General;  Laterality: N/A;    Current Outpatient Prescriptions  Medication Sig Dispense Refill  . amLODipine (NORVASC) 5 MG tablet TAKE ONE TABLET BY MOUTH ONCE DAILY  30 tablet  5  . benazepril (LOTENSIN) 20 MG tablet TAKE ONE TABLET BY MOUTH ONCE DAILY  30 tablet  10  . cloNIDine (CATAPRES) 0.1 MG tablet Take 0.1 mg by mouth 2 (two) times daily.      Marland Kitchen ibuprofen (ADVIL,MOTRIN) 600 MG tablet Take one by mouth every 5 hours as needed for pain.  30 tablet  0  . pravastatin (PRAVACHOL) 40 MG tablet TAKE ONE TABLET BY MOUTH ONCE DAILY  30 tablet  5  . loratadine (CLARITIN) 10 MG tablet Take 10 mg by mouth daily.       No current facility-administered medications for this visit.     ALLERGIES: Vicodin and Betadine  Family History  Problem Relation Age of Onset  . Diabetes Mother   . Thyroid disease Mother   . Mental retardation Maternal Aunt   . Diabetes Maternal Grandmother     History   Social History  . Marital Status: Married    Spouse Name: N/A    Number of Children: N/A  . Years of Education: N/A   Occupational History  . Not on file.   Social History Main Topics  . Smoking status: Never Smoker   . Smokeless tobacco: Never Used  . Alcohol Use: 0.5 oz/week    1 drink(s) per week     Comment: occ glass of wine  . Drug Use: No  . Sexual Activity: Yes    Partners:  Male    Birth Control/ Protection: Surgical     Comment: BTSP 2005/TAH/BSO   Other Topics Concern  . Not on file   Social History Narrative  . No narrative on file    ROS:  Pertinent items are noted in HPI.  PHYSICAL EXAMINATION:    BP 100/68  Pulse 80  Temp(Src) 98.1 F (36.7 C) (Oral)  Ht 5\' 5"  (1.651 m)  Wt 173 lb 12.8 oz (78.835 kg)  BMI 28.92 kg/m2  LMP 04/17/2014   Wt Readings from Last 3 Encounters:  05/31/14 173 lb 12.8 oz (78.835 kg)  05/19/14 173 lb (78.472 kg)  05/12/14 176 lb (79.833 kg)     Ht Readings from Last 3 Encounters:  05/31/14 5\' 5"  (1.651 m)  05/19/14 5\' 6"  (1.676 m)  05/12/14 5\' 5"  (1.651 m)    General appearance: alert, cooperative and appears stated age Head: Normocephalic, without obvious abnormality, atraumatic Neck: no adenopathy, supple, symmetrical, trachea midline and thyroid not enlarged, symmetric, no tenderness/mass/nodules Lungs: clear to auscultation bilaterally Heart: regular rate and rhythm Abdomen: Pfannenstiel incision intact, clean and dry, no erythema.  Soft, non-tender; no masses,  no organomegaly Extremities: extremities normal, atraumatic, no cyanosis or edema Skin: Skin color, texture, turgor normal. No rashes or lesions Lymph nodes: Cervical, supraclavicular, and axillary nodes normal. No abnormal inguinal nodes palpated Neurologic: Grossly normal  Pelvic: External genitalia:  no lesions              Urethra:  normal appearing urethra with no masses, tenderness or lesions              Bartholins and Skenes: normal                 Vagina: normal appearing vagina with normal color and discharge, no lesions              Cervix:  Absent, cuff intact.                  Bimanual Exam:  Uterus:   absent                                      Adnexa: no masses.  ASSESSMENT  Status post total abdominal hysterectomy with bilateral salpingectomy.  Dysuria.  Microscopic hematuria.    PLAN  Urine culture.  Ciprfloxacin 500 mg po bid for 7 days.  Declines pyridium.  Call for worsening symptoms.  Follow up in about 10 days for final post op check.   An After Visit Summary was printed and given to the patient.  15 minutes face to face time of which over 50% was spent in counseling.

## 2014-05-31 NOTE — Patient Instructions (Signed)
Please start your antibiotics today. We will contact you when the urine culture result is back.   Reduce your activity! Don't move boxes or furniture the coming week.

## 2014-05-31 NOTE — Telephone Encounter (Signed)
The patient calling stating, "I am having pain and numbness in my lower back, I think I may have done too much over the weekend. I need to know whether Dr. Quincy Simmonds wants me to come be seen in the office or go to the emergency room?" Patient had surgery recently. Please advise?

## 2014-05-31 NOTE — Telephone Encounter (Signed)
Spoke with patient. Patient had hysterectomy on 5/19. Patient states that she did "light cleaning" around the house on Friday. "I have not swept, mopped, or vacuumed." Patient is now having lower back pain and numbness and her stomach feels "tight". Pain is 8/10 but is relieved with ibuprofen. Was nauseous on Saturday but did not vomit. Patient has not been nauseous since. Patient rested all day yesterday and is still in pain today. Denies fevers, chills, nausea, and bleeding. Patient is occasionally having tingling sensation with urination. Patient would like to know if Dr.Silva would like to see her or if see should be seen in the ER. Advised patient that I will speak with the provider and give patient a call back with further instructions. Patient agreeable.

## 2014-05-31 NOTE — Telephone Encounter (Signed)
Spoke with patient. Scheduled office visit with Dr. Quincy Simmonds for 1230.

## 2014-05-31 NOTE — Telephone Encounter (Signed)
Left message to call [facility] at [phone].   Offer patient appointment today with Dr.Silva at 1300 (time per Gay Filler).

## 2014-06-01 LAB — URINE CULTURE
Colony Count: NO GROWTH
ORGANISM ID, BACTERIA: NO GROWTH

## 2014-06-02 ENCOUNTER — Ambulatory Visit: Payer: BC Managed Care – PPO | Admitting: Obstetrics and Gynecology

## 2014-06-02 ENCOUNTER — Encounter: Payer: Self-pay | Admitting: Obstetrics and Gynecology

## 2014-06-14 ENCOUNTER — Ambulatory Visit (INDEPENDENT_AMBULATORY_CARE_PROVIDER_SITE_OTHER): Payer: BC Managed Care – PPO | Admitting: Obstetrics and Gynecology

## 2014-06-14 ENCOUNTER — Encounter: Payer: Self-pay | Admitting: Obstetrics and Gynecology

## 2014-06-14 VITALS — BP 120/84 | HR 64 | Temp 98.1°F | Ht 65.0 in | Wt 170.0 lb

## 2014-06-14 DIAGNOSIS — Z90710 Acquired absence of both cervix and uterus: Secondary | ICD-10-CM

## 2014-06-14 NOTE — Progress Notes (Signed)
Patient ID: Mary Robles, female   DOB: 07/22/1976, 38 y.o.   MRN: [id number] GYNECOLOGY  VISIT   HPI: 38 y.o.   Married  [place] American  female   [phone] with Patient's last menstrual period was 04/17/2014.   here for   6 weeks post op. Feels great.  Wants to return to work.  Asking about activity level.   GYNECOLOGIC HISTORY: Patient's last menstrual period was 04/17/2014. Contraception:  Hysterectomy  Menopausal hormone therapy: no        OB History   Grav Para Term Preterm Abortions TAB SAB Ect Mult Living   4 2   1 1    2          Patient Active Problem List   Diagnosis Date Noted  . Lipoma of abdominal wall s/p excision 05/04/2014 05/19/2014  . Status post total abdominal hysterectomy 05/04/2014  . Hypertension   . Vitamin D deficiency     Past Medical History  Diagnosis Date  . Herpes simplex   . Sickle cell trait   . Anxiety   . Hypertension   . Hyperlysinemia   . Vitamin D deficiency   . Depression   . Asthma     no symptoms in "years", no inhaler    Past Surgical History  Procedure Laterality Date  . Tubal ligation    . Foot surgery    . Abdominal hysterectomy N/A 05/04/2014    Procedure: HYSTERECTOMY ABDOMINAL with BILATERAL SALPINGECTOMY ;  Surgeon: Everardo All Amundson de Berton Lan, MD;  Location: [facility];  Service: Gynecology;  Laterality: N/A;  . Lipoma excision N/A 05/04/2014    Procedure: EXCISION LIPOMA of abdominal wall;  Surgeon: Jamey Reas de Berton Lan, MD;  Location: [facility];  Service: Gynecology;  Laterality: N/A;  . Laparotomy N/A 05/04/2014    Procedure: REMOVAL OF ABD WALL MASS ;  Surgeon: Adin Hector, MD;  Location: [facility];  Service: General;  Laterality: N/A;    Current Outpatient Prescriptions  Medication Sig Dispense Refill  . amLODipine (NORVASC) 5 MG tablet TAKE ONE TABLET BY MOUTH ONCE DAILY  30 tablet  5  . benazepril (LOTENSIN) 20 MG tablet TAKE ONE TABLET BY MOUTH ONCE DAILY  30 tablet  10  . cloNIDine  (CATAPRES) 0.1 MG tablet Take 0.1 mg by mouth 2 (two) times daily.      Marland Kitchen ibuprofen (ADVIL,MOTRIN) 200 MG tablet Take 200 mg by mouth every 6 (six) hours as needed.      . loratadine (CLARITIN) 10 MG tablet Take 10 mg by mouth daily.      . pravastatin (PRAVACHOL) 40 MG tablet TAKE ONE TABLET BY MOUTH ONCE DAILY  30 tablet  5   No current facility-administered medications for this visit.     ALLERGIES: Vicodin and Betadine  Family History  Problem Relation Age of Onset  . Diabetes Mother   . Thyroid disease Mother   . Mental retardation Maternal Aunt   . Diabetes Maternal Grandmother     History   Social History  . Marital Status: Married    Spouse Name: N/A    Number of Children: N/A  . Years of Education: N/A   Occupational History  . Not on file.   Social History Main Topics  . Smoking status: Never Smoker   . Smokeless tobacco: Never Used  . Alcohol Use: 0.5 oz/week    1 drink(s) per week     Comment: occ glass of wine  .  Drug Use: No  . Sexual Activity: Yes    Partners: Male    Birth Control/ Protection: Surgical     Comment: BTSP 2005/TAH/BSO   Other Topics Concern  . Not on file   Social History Narrative  . No narrative on file    ROS:  Pertinent items are noted in HPI.  PHYSICAL EXAMINATION:    BP 120/84  Pulse 64  Temp(Src) 98.1 F (36.7 C) (Oral)  Ht 5\' 5"  (1.651 m)  Wt 170 lb (77.111 kg)  BMI 28.29 kg/m2  LMP 04/17/2014     General appearance: alert, cooperative and appears stated age Abdomen: pfannenstiel incision intact, soft, non-tender; no masses,  no organomegaly No abnormal inguinal nodes palpated  Pelvic: External genitalia:  no lesions              Urethra:  normal appearing urethra with no masses, tenderness or lesions              Bartholins and Skenes: normal                 Vagina: normal appearing vagina with normal color and discharge, no lesions. Suture material at top of vagina - cuff.               Cervix:  absent                    Bimanual Exam:  Uterus:   absent                                      Adnexa: normal adnexa in size, nontender and no masses                                   ASSESSMENT  Status post TAH/bilateral salpingectomy/excision of abdominal wall lipoma.    PLAN  OK to return to work this week. Park for sexual activity in 2 weeks.  Return in February 2016 for annual exam.    An After Visit Summary was printed and given to the patient.

## 2014-06-14 NOTE — Patient Instructions (Signed)
You may return to work as previously planned.

## 2014-06-16 ENCOUNTER — Ambulatory Visit: Payer: BC Managed Care – PPO | Admitting: Obstetrics and Gynecology

## 2014-06-28 ENCOUNTER — Ambulatory Visit: Payer: BC Managed Care – PPO | Admitting: Family Medicine

## 2014-07-09 ENCOUNTER — Encounter: Payer: Self-pay | Admitting: Family Medicine

## 2014-07-09 ENCOUNTER — Ambulatory Visit (INDEPENDENT_AMBULATORY_CARE_PROVIDER_SITE_OTHER): Payer: BC Managed Care – PPO | Admitting: Family Medicine

## 2014-07-09 VITALS — BP 128/68 | HR 78 | Temp 98.2°F | Resp 14 | Ht 64.0 in | Wt 173.0 lb

## 2014-07-09 DIAGNOSIS — R0981 Nasal congestion: Secondary | ICD-10-CM | POA: Insufficient documentation

## 2014-07-09 DIAGNOSIS — R3989 Other symptoms and signs involving the genitourinary system: Secondary | ICD-10-CM | POA: Insufficient documentation

## 2014-07-09 DIAGNOSIS — G4700 Insomnia, unspecified: Secondary | ICD-10-CM

## 2014-07-09 DIAGNOSIS — E785 Hyperlipidemia, unspecified: Secondary | ICD-10-CM | POA: Insufficient documentation

## 2014-07-09 DIAGNOSIS — R5383 Other fatigue: Secondary | ICD-10-CM

## 2014-07-09 DIAGNOSIS — J3489 Other specified disorders of nose and nasal sinuses: Secondary | ICD-10-CM

## 2014-07-09 DIAGNOSIS — I10 Essential (primary) hypertension: Secondary | ICD-10-CM

## 2014-07-09 DIAGNOSIS — R5381 Other malaise: Secondary | ICD-10-CM

## 2014-07-09 DIAGNOSIS — N399 Disorder of urinary system, unspecified: Secondary | ICD-10-CM

## 2014-07-09 LAB — CBC WITH DIFFERENTIAL/PLATELET
BASOS PCT: 0 % (ref 0–1)
Basophils Absolute: 0 10*3/uL (ref 0.0–0.1)
Eosinophils Absolute: 0.1 10*3/uL (ref 0.0–0.7)
Eosinophils Relative: 2 % (ref 0–5)
HCT: 38.5 % (ref 36.0–46.0)
HEMOGLOBIN: 13.2 g/dL (ref 12.0–15.0)
LYMPHS ABS: 2.5 10*3/uL (ref 0.7–4.0)
Lymphocytes Relative: 37 % (ref 12–46)
MCH: 27.1 pg (ref 26.0–34.0)
MCHC: 34.3 g/dL (ref 30.0–36.0)
MCV: 79.1 fL (ref 78.0–100.0)
MONO ABS: 0.6 10*3/uL (ref 0.1–1.0)
Monocytes Relative: 9 % (ref 3–12)
Neutro Abs: 3.5 10*3/uL (ref 1.7–7.7)
Neutrophils Relative %: 52 % (ref 43–77)
Platelets: 290 10*3/uL (ref 150–400)
RBC: 4.87 MIL/uL (ref 3.87–5.11)
RDW: 15.2 % (ref 11.5–15.5)
WBC: 6.8 10*3/uL (ref 4.0–10.5)

## 2014-07-09 LAB — BASIC METABOLIC PANEL WITH GFR
BUN: 8 mg/dL (ref 6–23)
CO2: 25 meq/L (ref 19–32)
Calcium: 9.3 mg/dL (ref 8.4–10.5)
Chloride: 104 meq/L (ref 96–112)
Creat: 0.81 mg/dL (ref 0.50–1.10)
Glucose, Bld: 81 mg/dL (ref 70–99)
Potassium: 4.4 meq/L (ref 3.5–5.3)
Sodium: 137 meq/L (ref 135–145)

## 2014-07-09 LAB — TSH: TSH: 2.742 u[IU]/mL (ref 0.350–4.500)

## 2014-07-09 MED ORDER — PRAVASTATIN SODIUM 40 MG PO TABS: ORAL_TABLET | ORAL | Status: DC

## 2014-07-09 MED ORDER — CLONAZEPAM 1 MG PO TABS: 1.0000 mg | ORAL_TABLET | Freq: Every evening | ORAL | Status: DC | PRN

## 2014-07-09 MED ORDER — MOMETASONE FUROATE 50 MCG/ACT NA SUSP: 2.0000 | Freq: Every day | NASAL | Status: DC

## 2014-07-09 NOTE — Progress Notes (Signed)
Patient ID: Mary Robles, female   DOB: 26-Sep-1976, 38 y.o.   MRN: [id number]   Subjective:    Patient ID: Mary Robles, female    DOB: [birth year], 38 y.o.   MRN: [id number]  Patient presents for No Energy, HTN Management and Urinary Urgency  patient here with multiple concerns. She has history of hypertension she's had some episodes of fatigue and weakness she thinks may be related her blood pressure. She really had a hysterectomy and her blood pressure is running lower at times was also concerned about her potassium as she's felt this was on her potassium has been low.  Insomnia she continues to have difficulty with insomnia was also given medication the past including Ambien and clonazepam. She's been stressed recently as they moved out of their home into an apartment. There is also some stress at work.  She would like to come off of her cholesterol medication as well and asked him to review her labs.  Difficulty urinating she's had a few episodes where she had some frequency of urination. She denies any dysuria. At times she felt like she can only dribble this is occurred since her hysterectomy. Other times she has normal urine flow. The past week or so she's not had any significant difficulties. She was examined by her GYN at the end of June and was told everything looked okay. She does have no trace hematuria which is been worked up in the past a CT scan as well as urology and cystoscopy and was told it was benign.     Review Of Systems: per above  GEN- +fatigue, fever, weight loss,weakness, recent illness HEENT- denies eye drainage, change in vision, nasal discharge, CVS- denies chest pain, palpitations RESP- denies SOB, cough, wheeze ABD- denies N/V, change in stools, abd pain GU- denies dysuria, hematuria, dribbling, incontinence MSK- denies joint pain, muscle aches, injury Neuro- denies headache, dizziness, syncope, seizure activity       Objective:    BP 128/68   Pulse 78  Temp(Src) 98.2 F (36.8 C) (Oral)  Resp 14  Ht 5\' 4"  (1.626 m)  Wt 173 lb (78.472 kg)  BMI 29.68 kg/m2  LMP 04/17/2014 GEN- NAD, alert and oriented x3 HEENT- PERRL, EOMI, non injected sclera, pink conjunctiva, MMM, oropharynx clear, enlarged nasal turbinates, no drainage noted Neck- Supple, no thyromegaly CVS- RRR, no murmur RESP-CTAB ABD-NABS,soft,NT,ND, no CVA tenderness EXT- No edema Psych- normal affect and mood Pulses- Radial 2+        Assessment & Plan:      Problem List Items Addressed This Visit   Other and unspecified hyperlipidemia   Relevant Medications      pravastatin (PRAVACHOL) tablet   Insomnia   Hypertension - Primary   Relevant Medications      pravastatin (PRAVACHOL) tablet   Other Relevant Orders      Basic metabolic panel      CBC with Differential    Other Visit Diagnoses   Other malaise and fatigue        Relevant Orders       TSH       Note: This dictation was prepared with Dragon dictation along with smaller phrase technology. Any transcriptional errors that result from this process are unintentional.

## 2014-07-09 NOTE — Assessment & Plan Note (Signed)
She is urinating now without any difficulty. She did have urinalysis culture and evaluation by her GYN everything was benign. Neck supple be to send her to urology, doubt any significant urinary retention at this time

## 2014-07-09 NOTE — Assessment & Plan Note (Signed)
Blood pressure has been on more the low normal tensive side. I will discontinue her amlodipine I will continue her on the benazepril in the clonidine. Note she states the clonidine does not make her fatigue and she's been on this for many years.

## 2014-07-09 NOTE — Assessment & Plan Note (Signed)
All restart her clonazepam at bedtime. This will help with some of the stress from the move as well as her insomnia

## 2014-07-09 NOTE — Patient Instructions (Addendum)
Take 1/2 tablet of pravastatin  Stop the norvasc We will call about potassium Try the nasonex  Stop the flonase  Call if not better, then referral ENT  Continue with the fluids  GIVE LATE NOTE FOR WORK FOR TODAY F/U 3 months

## 2014-07-09 NOTE — Assessment & Plan Note (Signed)
I reviewed her cholesterol labs she does not have a history of coronary artery disease she is not diabetic. Will decrease her pravastatin 20 mg a she is change her diet and is exercising we'll see how she does in 3 months.

## 2014-07-09 NOTE — Assessment & Plan Note (Addendum)
She also felt like she was snoring more and louder and that her nose is always congested and she mouth breathes at night. She tried the [facility] for inflammation but she has side effects with appears to like to try something different. She's not had any events where she stops breathing noted by her spouse  Trial of Nasonex, may need ENT if not improved

## 2014-07-09 NOTE — Assessment & Plan Note (Signed)
I think this is mostly multifactorial. She has some insomnia she has some stress also the blood pressure medication may be more than she needs. I will check her labs. Changes made per above

## 2014-08-23 ENCOUNTER — Emergency Department (HOSPITAL_COMMUNITY)
Admission: EM | Admit: 2014-08-23 | Discharge: 2014-08-23 | Disposition: A | Discharge status: Home / Self Care | Payer: BC Managed Care – PPO | Attending: Emergency Medicine | Admitting: Emergency Medicine

## 2014-08-23 ENCOUNTER — Emergency Department (HOSPITAL_COMMUNITY): Payer: BC Managed Care – PPO

## 2014-08-23 ENCOUNTER — Encounter (HOSPITAL_COMMUNITY): Payer: Self-pay | Admitting: Emergency Medicine

## 2014-08-23 DIAGNOSIS — J069 Acute upper respiratory infection, unspecified: Secondary | ICD-10-CM | POA: Insufficient documentation

## 2014-08-23 DIAGNOSIS — J45909 Unspecified asthma, uncomplicated: Secondary | ICD-10-CM | POA: Insufficient documentation

## 2014-08-23 DIAGNOSIS — F411 Generalized anxiety disorder: Secondary | ICD-10-CM | POA: Insufficient documentation

## 2014-08-23 DIAGNOSIS — Z862 Personal history of diseases of the blood and blood-forming organs and certain disorders involving the immune mechanism: Secondary | ICD-10-CM | POA: Insufficient documentation

## 2014-08-23 DIAGNOSIS — H9209 Otalgia, unspecified ear: Secondary | ICD-10-CM | POA: Insufficient documentation

## 2014-08-23 DIAGNOSIS — R0789 Other chest pain: Secondary | ICD-10-CM | POA: Diagnosis present

## 2014-08-23 DIAGNOSIS — Z79899 Other long term (current) drug therapy: Secondary | ICD-10-CM | POA: Diagnosis not present

## 2014-08-23 DIAGNOSIS — I10 Essential (primary) hypertension: Secondary | ICD-10-CM | POA: Diagnosis not present

## 2014-08-23 DIAGNOSIS — Z8619 Personal history of other infectious and parasitic diseases: Secondary | ICD-10-CM | POA: Insufficient documentation

## 2014-08-23 DIAGNOSIS — Z8639 Personal history of other endocrine, nutritional and metabolic disease: Secondary | ICD-10-CM | POA: Insufficient documentation

## 2014-08-23 LAB — URINALYSIS, ROUTINE W REFLEX MICROSCOPIC
BILIRUBIN URINE: NEGATIVE
Glucose, UA: NEGATIVE mg/dL
Ketones, ur: 15 mg/dL — AB
LEUKOCYTES UA: NEGATIVE
NITRITE: NEGATIVE
PH: 7 (ref 5.0–8.0)
Protein, ur: NEGATIVE mg/dL
SPECIFIC GRAVITY, URINE: 1.009 (ref 1.005–1.030)
Urobilinogen, UA: 0.2 mg/dL (ref 0.0–1.0)

## 2014-08-23 LAB — CBG MONITORING, ED: GLUCOSE-CAPILLARY: 76 mg/dL (ref 70–99)

## 2014-08-23 LAB — URINE MICROSCOPIC-ADD ON

## 2014-08-23 MED ORDER — ONDANSETRON HCL 4 MG PO TABS: 4.0000 mg | ORAL_TABLET | Freq: Four times a day (QID) | ORAL | Status: DC

## 2014-08-23 MED ORDER — SODIUM CHLORIDE 0.9 % IV BOLUS (SEPSIS)
1000.0000 mL | Freq: Once | INTRAVENOUS | Status: AC
  Administered 2014-08-23: 1000 mL via INTRAVENOUS

## 2014-08-23 NOTE — Discharge Instructions (Signed)
Upper Respiratory Infection, Adult An upper respiratory infection (URI) is also sometimes known as the common cold. The upper respiratory tract includes the nose, sinuses, throat, trachea, and bronchi. Bronchi are the airways leading to the lungs. Most people improve within 1 week, but symptoms can last up to 2 weeks. A residual cough may last even longer.  CAUSES Many different viruses can infect the tissues lining the upper respiratory tract. The tissues become irritated and inflamed and often become very moist. Mucus production is also common. A cold is contagious. You can easily spread the virus to others by oral contact. This includes kissing, sharing a glass, coughing, or sneezing. Touching your mouth or nose and then touching a surface, which is then touched by another person, can also spread the virus. SYMPTOMS  Symptoms typically develop 1 to 3 days after you come in contact with a cold virus. Symptoms vary from person to person. They may include:  Runny nose.  Sneezing.  Nasal congestion.  Sinus irritation.  Sore throat.  Loss of voice (laryngitis).  Cough.  Fatigue.  Muscle aches.  Loss of appetite.  Headache.  Low-grade fever. DIAGNOSIS  You might diagnose your own cold based on familiar symptoms, since most people get a cold 2 to 3 times a year. Your caregiver can confirm this based on your exam. Most importantly, your caregiver can check that your symptoms are not due to another disease such as strep throat, sinusitis, pneumonia, asthma, or epiglottitis. Blood tests, throat tests, and X-rays are not necessary to diagnose a common cold, but they may sometimes be helpful in excluding other more serious diseases. Your caregiver will decide if any further tests are required. RISKS AND COMPLICATIONS  You may be at risk for a more severe case of the common cold if you smoke cigarettes, have chronic heart disease (such as heart failure) or lung disease (such as asthma), or if  you have a weakened immune system. The very young and very old are also at risk for more serious infections. Bacterial sinusitis, middle ear infections, and bacterial pneumonia can complicate the common cold. The common cold can worsen asthma and chronic obstructive pulmonary disease (COPD). Sometimes, these complications can require emergency medical care and may be life-threatening. PREVENTION  The best way to protect against getting a cold is to practice good hygiene. Avoid oral or hand contact with people with cold symptoms. Wash your hands often if contact occurs. There is no clear evidence that vitamin C, vitamin E, echinacea, or exercise reduces the chance of developing a cold. However, it is always recommended to get plenty of rest and practice good nutrition. TREATMENT  Treatment is directed at relieving symptoms. There is no cure. Antibiotics are not effective, because the infection is caused by a virus, not by bacteria. Treatment may include:  Increased fluid intake. Sports drinks offer valuable electrolytes, sugars, and fluids.  Breathing heated mist or steam (vaporizer or shower).  Eating chicken soup or other clear broths, and maintaining good nutrition.  Getting plenty of rest.  Using gargles or lozenges for comfort.  Controlling fevers with ibuprofen or acetaminophen as directed by your caregiver.  Increasing usage of your inhaler if you have asthma. Zinc gel and zinc lozenges, taken in the first 24 hours of the common cold, can shorten the duration and lessen the severity of symptoms. Pain medicines may help with fever, muscle aches, and throat pain. A variety of non-prescription medicines are available to treat congestion and runny nose. Your caregiver   can make recommendations and may suggest nasal or lung inhalers for other symptoms.  HOME CARE INSTRUCTIONS   Only take over-the-counter or prescription medicines for pain, discomfort, or fever as directed by your  caregiver.  Use a warm mist humidifier or inhale steam from a shower to increase air moisture. This may keep secretions moist and make it easier to breathe.  Drink enough water and fluids to keep your urine clear or pale yellow.  Rest as needed.  Return to work when your temperature has returned to normal or as your caregiver advises. You may need to stay home longer to avoid infecting others. You can also use a face mask and careful hand washing to prevent spread of the virus. SEEK MEDICAL CARE IF:   After the first few days, you feel you are getting worse rather than better.  You need your caregiver's advice about medicines to control symptoms.  You develop chills, worsening shortness of breath, or brown or red sputum. These may be signs of pneumonia.  You develop yellow or brown nasal discharge or pain in the face, especially when you bend forward. These may be signs of sinusitis.  You develop a fever, swollen neck glands, pain with swallowing, or white areas in the back of your throat. These may be signs of strep throat. SEEK IMMEDIATE MEDICAL CARE IF:   You have a fever.  You develop severe or persistent headache, ear pain, sinus pain, or chest pain.  You develop wheezing, a prolonged cough, cough up blood, or have a change in your usual mucus (if you have chronic lung disease).  You develop sore muscles or a stiff neck. Document Released: 05/29/2001 Document Revised: 02/25/2012 Document Reviewed: 03/10/2014 Exit[employer] Patient Information 2015 [employer]. This information is not intended to replace advice given to you by your health care provider. Make sure you discuss any questions you have with your health care provider.  

## 2014-08-23 NOTE — ED Provider Notes (Signed)
CSN: [id number]     Arrival date & time 08/23/14  1350 History   First MD Initiated Contact with Patient 08/23/14 1458     Chief Complaint  Patient presents with  . chest tightness   . Cough  . general weakness      (Consider location/radiation/quality/duration/timing/severity/associated sxs/prior Treatment) Patient is a 38 y.o. female presenting with cough.  Cough Cough characteristics:  Non-productive Severity:  Mild Onset quality:  Gradual Duration:  2 days Timing:  Constant Progression:  Unchanged Chronicity:  Recurrent Context: upper respiratory infection (sore throat, rhinorrhea, sinus pressure)   Relieved by:  Nothing Worsened by:  Nothing tried Associated symptoms: chills, ear pain (L) and sinus congestion   Associated symptoms: no chest pain, no fever, no rash, no rhinorrhea, no shortness of breath and no sore throat     Past Medical History  Diagnosis Date  . Herpes simplex   . Sickle cell trait   . Anxiety   . Hypertension   . Hyperlysinemia   . Vitamin D deficiency   . Depression   . Asthma     no symptoms in "years", no inhaler   Past Surgical History  Procedure Laterality Date  . Tubal ligation    . Foot surgery    . Abdominal hysterectomy N/A 05/04/2014    Procedure: HYSTERECTOMY ABDOMINAL with BILATERAL SALPINGECTOMY ;  Surgeon: Everardo All Amundson de Berton Lan, MD;  Location: [facility];  Service: Gynecology;  Laterality: N/A;  . Lipoma excision N/A 05/04/2014    Procedure: EXCISION LIPOMA of abdominal wall;  Surgeon: Jamey Reas de Berton Lan, MD;  Location: [facility];  Service: Gynecology;  Laterality: N/A;  . Laparotomy N/A 05/04/2014    Procedure: REMOVAL OF ABD WALL MASS ;  Surgeon: Adin Hector, MD;  Location: [facility];  Service: General;  Laterality: N/A;   Family History  Problem Relation Age of Onset  . Diabetes Mother   . Thyroid disease Mother   . Mental retardation Maternal Aunt   . Diabetes Maternal Grandmother    History   Substance Use Topics  . Smoking status: Never Smoker   . Smokeless tobacco: Never Used  . Alcohol Use: 0.5 oz/week    1 drink(s) per week     Comment: occ glass of wine   OB History   Grav Para Term Preterm Abortions TAB SAB Ect Mult Living   4 2   1 1    2      Review of Systems  Constitutional: Positive for chills. Negative for fever.  HENT: Positive for ear pain (L). Negative for congestion, rhinorrhea and sore throat.   Eyes: Negative for photophobia and visual disturbance.  Respiratory: Positive for cough. Negative for shortness of breath.   Cardiovascular: Negative for chest pain and leg swelling.  Gastrointestinal: Negative for nausea, vomiting, abdominal pain, diarrhea and constipation.  Endocrine: Negative for polyphagia and polyuria.  Genitourinary: Negative for dysuria, flank pain, vaginal bleeding, vaginal discharge and enuresis.  Musculoskeletal: Negative for back pain and gait problem.  Skin: Negative for color change and rash.  Neurological: Negative for dizziness, syncope, light-headedness and numbness.  Hematological: Negative for adenopathy. Does not bruise/bleed easily.  All other systems reviewed and are negative.     Allergies  Vicodin and Betadine  Home Medications   Prior to Admission medications   Medication Sig Start Date End Date Taking? Authorizing Provider  benazepril (LOTENSIN) 20 MG tablet Take 20 mg by mouth daily.   Yes  Historical Provider, MD  clonazePAM (KLONOPIN) 1 MG tablet Take 1 tablet (1 mg total) by mouth at bedtime as needed for anxiety. 07/09/14  Yes Alycia Rossetti, MD  cloNIDine (CATAPRES) 0.1 MG tablet Take 0.1 mg by mouth 2 (two) times daily.   Yes Historical Provider, MD  pravastatin (PRAVACHOL) 40 MG tablet Take 40 mg by mouth daily.   Yes Historical Provider, MD   BP 171/85  Pulse 103  Temp(Src) 98.1 F (36.7 C) (Oral)  Resp 16  SpO2 100%  LMP 04/17/2014 Physical Exam  Vitals reviewed. Constitutional: She is oriented  to person, place, and time. She appears well-developed and well-nourished.  HENT:  Head: Normocephalic and atraumatic.  Right Ear: Tympanic membrane and external ear normal.  Left Ear: Tympanic membrane and external ear normal.  Eyes: Conjunctivae and EOM are normal. Pupils are equal, round, and reactive to light.  Neck: Normal range of motion. Neck supple.  Cardiovascular: Normal rate, regular rhythm, normal heart sounds and intact distal pulses.   Pulmonary/Chest: Effort normal and breath sounds normal.  Abdominal: Soft. Bowel sounds are normal. There is no tenderness.  Musculoskeletal: Normal range of motion.  Neurological: She is alert and oriented to person, place, and time.  Skin: Skin is warm and dry.    ED Course  Procedures (including critical care time) Labs Review Labs Reviewed  URINALYSIS, ROUTINE W REFLEX MICROSCOPIC - Abnormal; Notable for the following:    Hgb urine dipstick TRACE (*)    Ketones, ur 15 (*)    All other components within normal limits  URINE MICROSCOPIC-ADD ON  CBG MONITORING, ED    Imaging Review Dg Chest 2 View  08/23/2014   CLINICAL DATA:  Cough, fever  EXAM: CHEST  2 VIEW  COMPARISON:  01/29/2014  FINDINGS: The heart size and mediastinal contours are within normal limits. Both lungs are clear. The visualized skeletal structures are unremarkable.  IMPRESSION: No active cardiopulmonary disease.   Electronically Signed   By: Conchita Paris M.D.   On: 08/23/2014 15:32     EKG Interpretation None      MDM   Final diagnoses:  Upper respiratory infection    38 y.o. female  with pertinent PMH of HTN, anxiety presents with URI symptoms x 4 days, acutely worsening today with onset of nausea without vomiting.  She denies chest pain, dyspnea, or other concerning features, and is predominantly concerned with sinus pressure and L ear pain.  On arrival vitals and physical exam as above, no sinus tenderness, TM clear bilaterally, oropharynx clear.  Lungs  CTAB.    Labs and imaging as above reviewed. XR unremarkable.  UA without signs of infection.  Likely etiology of symptoms viral process.  Pt given small amount of zofran.  DC home in stable condition with standard return precautions.   1. Upper respiratory infection         Debby Freiberg, MD 08/23/14 [phone]

## 2014-08-23 NOTE — ED Notes (Signed)
Per ems pt reports a cold x2 days, got worse around 1300, started having SOB and headache. When fire department arrived pt was hyperventilating and anxious. Generalized weakness. Denies fever. Nausea present. 4mg  zofran IV given. 20 g L hand  Upon rn assessment pt reports chest tightness and cough. Denies pain. Reports she drank 3 shots last night.

## 2014-08-25 ENCOUNTER — Encounter: Payer: Self-pay | Admitting: Family Medicine

## 2014-08-26 ENCOUNTER — Inpatient Hospital Stay: Payer: BC Managed Care – PPO | Admitting: Family Medicine

## 2014-09-03 ENCOUNTER — Ambulatory Visit (INDEPENDENT_AMBULATORY_CARE_PROVIDER_SITE_OTHER): Payer: BC Managed Care – PPO | Admitting: Family Medicine

## 2014-09-03 ENCOUNTER — Encounter: Payer: Self-pay | Admitting: Family Medicine

## 2014-09-03 VITALS — BP 138/86 | HR 64 | Temp 98.1°F | Resp 14 | Ht 64.0 in | Wt 167.0 lb

## 2014-09-03 DIAGNOSIS — I10 Essential (primary) hypertension: Secondary | ICD-10-CM

## 2014-09-03 DIAGNOSIS — J0100 Acute maxillary sinusitis, unspecified: Secondary | ICD-10-CM

## 2014-09-03 LAB — COMPREHENSIVE METABOLIC PANEL
ALT: 15 U/L (ref 0–35)
AST: 16 U/L (ref 0–37)
Albumin: 3.9 g/dL (ref 3.5–5.2)
Alkaline Phosphatase: 45 U/L (ref 39–117)
BUN: 9 mg/dL (ref 6–23)
CO2: 25 mEq/L (ref 19–32)
Calcium: 9 mg/dL (ref 8.4–10.5)
Chloride: 105 mEq/L (ref 96–112)
Creat: 0.85 mg/dL (ref 0.50–1.10)
Glucose, Bld: 84 mg/dL (ref 70–99)
POTASSIUM: 4.4 meq/L (ref 3.5–5.3)
Sodium: 140 mEq/L (ref 135–145)
Total Bilirubin: 0.7 mg/dL (ref 0.2–1.2)
Total Protein: 6.8 g/dL (ref 6.0–8.3)

## 2014-09-03 LAB — CBC W/MCH & 3 PART DIFF
HEMATOCRIT: 39.4 % (ref 36.0–46.0)
HEMOGLOBIN: 14.4 g/dL (ref 12.0–15.0)
Lymphocytes Relative: 45 % (ref 12–46)
Lymphs Abs: 2.6 10*3/uL (ref 0.7–4.0)
MCH: 27.6 pg (ref 26.0–34.0)
MCHC: 36.5 g/dL — AB (ref 30.0–36.0)
MCV: 75.5 fL — AB (ref 78.0–100.0)
Neutro Abs: 2.7 10*3/uL (ref 1.7–7.7)
Neutrophils Relative %: 47 % (ref 43–77)
Platelets: 325 10*3/uL (ref 150–400)
RBC: 5.22 MIL/uL — ABNORMAL HIGH (ref 3.87–5.11)
RDW: 15.9 % — ABNORMAL HIGH (ref 11.5–15.5)
WBC mixed population %: 8 % (ref 3–18)
WBC: 5.8 10*3/uL (ref 4.0–10.5)
WBCMIX: 0.5 10*3/uL (ref 0.1–1.8)

## 2014-09-03 MED ORDER — AMOXICILLIN-POT CLAVULANATE 875-125 MG PO TABS: 1.0000 | ORAL_TABLET | Freq: Two times a day (BID) | ORAL | Status: DC

## 2014-09-03 MED ORDER — AMLODIPINE BESYLATE 5 MG PO TABS: 5.0000 mg | ORAL_TABLET | Freq: Every day | ORAL | Status: DC

## 2014-09-03 MED ORDER — FLUTICASONE PROPIONATE 50 MCG/ACT NA SUSP: 2.0000 | Freq: Every day | NASAL | Status: DC

## 2014-09-03 NOTE — Patient Instructions (Signed)
Take the Prilosec once a day Restart norvasc 5mg  once a day  We will cal with lab results  Start antibiotics and flonase for sinus infection F/U Keep f/u appt

## 2014-09-03 NOTE — Progress Notes (Signed)
Patient ID: Mary Robles, female   DOB: [birth year], 38 y.o.   MRN: [id number]   Subjective:    Patient ID: Mary Robles, female    DOB: [birth year], 38 y.o.   MRN: [id number]  Patient presents for F/U BP and Sinus Pressure  patient here followup hypertension. She was seen at [facility] long secondary to upper respiratory symptoms she was not prescribed any medications at that time her blood pressure was elevated at once 71/85 she then went to go see another primary care provider who works with her sister who is a Marine scientist he gave her an injection of antibiotics as well as a Z-Pak and was started her on diastolic because her blood pressure was elevated. She's been having increased fatigue since being on the medication but she continues to complain of a headache and pressure on the right side of her face she's had increased sinus drainage and right ear pain as well. She denies any chest pain shortness of breath or any cough.  Note unable to afford bystolic with insurance   Review Of Systems:  GEN- +fatigue, fever, weight loss,weakness, recent illness HEENT- denies eye drainage, change in vision, +nasal discharge, CVS- denies chest pain, palpitations RESP- denies SOB, cough, wheeze ABD- denies N/V, change in stools, abd pain GU- denies dysuria, hematuria, dribbling, incontinence MSK- denies joint pain, muscle aches, injury Neuro- denies headache, dizziness, syncope, seizure activity       Objective:    BP 138/86  Pulse 64  Temp(Src) 98.1 F (36.7 C) (Oral)  Resp 14  Ht 5\' 4"  (1.626 m)  Wt 167 lb (75.751 kg)  BMI 28.65 kg/m2  LMP 04/17/2014 GEN- NAD, alert and oriented x3 HEENT- PERRL, EOMI, non injected sclera, pink conjunctiva, MMM, oropharynx clear, TM clear bilat no effusion,  + maxillary sinus tenderness, inflammed turbinates,  Nasal drainage  Neck- Supple, no LAD CVS- RRR, no murmur RESP-CTAB EXT- No edema Pulses- Radial 2+         Assessment & Plan:      Problem  List Items Addressed This Visit   Hypertension     D/c bystolic, restart norvasc 5mg  once a day, patient was previously on this and tolerated it well. To continue her benazepril    Relevant Medications      amLODIpine (NORVASC) tablet   Other Relevant Orders      Comprehensive metabolic panel      CBC w/[facility] & 3 Part Diff (Completed)    Other Visit Diagnoses   Acute maxillary sinusitis, recurrence not specified    -  Primary    Relevant Medications       fluticasone (FLONASE) 50 MCG nasal spray       AMOXICILLIN-POT CLAVULANATE 875-125 MG PO TABS       Note: This dictation was prepared with Dragon dictation along with smaller phrase technology. Any transcriptional errors that result from this process are unintentional.

## 2014-09-03 NOTE — Assessment & Plan Note (Signed)
D/c bystolic, restart norvasc 5mg  once a day, patient was previously on this and tolerated it well. To continue her benazepril

## 2014-09-19 ENCOUNTER — Other Ambulatory Visit: Payer: Self-pay | Admitting: Family Medicine

## 2014-09-23 ENCOUNTER — Other Ambulatory Visit: Payer: Self-pay | Admitting: *Deleted

## 2014-09-23 MED ORDER — PRAVASTATIN SODIUM 40 MG PO TABS: ORAL_TABLET | ORAL | Status: DC

## 2014-09-23 NOTE — Telephone Encounter (Signed)
Received fax requesting refill on Pravastatin.   Refill appropriate and filled per protocol. 

## 2014-10-08 ENCOUNTER — Encounter: Payer: Self-pay | Admitting: Family Medicine

## 2014-10-08 ENCOUNTER — Ambulatory Visit (INDEPENDENT_AMBULATORY_CARE_PROVIDER_SITE_OTHER): Payer: BC Managed Care – PPO | Admitting: Family Medicine

## 2014-10-08 VITALS — BP 130/82 | HR 72 | Temp 98.3°F | Resp 14 | Ht 64.0 in | Wt 169.0 lb

## 2014-10-08 DIAGNOSIS — G4700 Insomnia, unspecified: Secondary | ICD-10-CM

## 2014-10-08 DIAGNOSIS — I10 Essential (primary) hypertension: Secondary | ICD-10-CM

## 2014-10-08 DIAGNOSIS — E785 Hyperlipidemia, unspecified: Secondary | ICD-10-CM

## 2014-10-08 LAB — CBC WITH DIFFERENTIAL/PLATELET
Basophils Absolute: 0.1 10*3/uL (ref 0.0–0.1)
Basophils Relative: 1 % (ref 0–1)
Eosinophils Absolute: 0.1 10*3/uL (ref 0.0–0.7)
Eosinophils Relative: 1 % (ref 0–5)
HCT: 40.2 % (ref 36.0–46.0)
HEMOGLOBIN: 13.5 g/dL (ref 12.0–15.0)
Lymphocytes Relative: 38 % (ref 12–46)
Lymphs Abs: 2.4 10*3/uL (ref 0.7–4.0)
MCH: 27.3 pg (ref 26.0–34.0)
MCHC: 33.6 g/dL (ref 30.0–36.0)
MCV: 81.2 fL (ref 78.0–100.0)
MONOS PCT: 11 % (ref 3–12)
Monocytes Absolute: 0.7 10*3/uL (ref 0.1–1.0)
NEUTROS ABS: 3 10*3/uL (ref 1.7–7.7)
Neutrophils Relative %: 49 % (ref 43–77)
Platelets: 301 10*3/uL (ref 150–400)
RBC: 4.95 MIL/uL (ref 3.87–5.11)
RDW: 15.8 % — ABNORMAL HIGH (ref 11.5–15.5)
WBC: 6.2 10*3/uL (ref 4.0–10.5)

## 2014-10-08 LAB — BASIC METABOLIC PANEL
BUN: 10 mg/dL (ref 6–23)
CO2: 25 mEq/L (ref 19–32)
Calcium: 9.4 mg/dL (ref 8.4–10.5)
Chloride: 104 mEq/L (ref 96–112)
Creat: 0.79 mg/dL (ref 0.50–1.10)
GLUCOSE: 75 mg/dL (ref 70–99)
POTASSIUM: 4.5 meq/L (ref 3.5–5.3)
SODIUM: 137 meq/L (ref 135–145)

## 2014-10-08 LAB — LIPID PANEL
CHOL/HDL RATIO: 4.1 ratio
CHOLESTEROL: 156 mg/dL (ref 0–200)
HDL: 38 mg/dL — AB (ref >39)
LDL Cholesterol: 101 mg/dL — ABNORMAL HIGH (ref 0–99)
Triglycerides: 87 mg/dL (ref <150)
VLDL: 17 mg/dL (ref 0–40)

## 2014-10-08 NOTE — Assessment & Plan Note (Signed)
Continue pravastatin , will decide if dose can be decreased based on lab results

## 2014-10-08 NOTE — Assessment & Plan Note (Signed)
Minimal use of klonopin, no change at this time

## 2014-10-08 NOTE — Patient Instructions (Signed)
Hold the norvasc and monitor your blood pressure We will call with lab results F/U 4 months

## 2014-10-08 NOTE — Progress Notes (Signed)
Patient ID: Mary Robles, female   DOB: 01/27/1976, 38 y.o.   MRN: [id number]   Subjective:    Patient ID: Mary Robles, female    DOB: 22-Aug-1976, 38 y.o.   MRN: [id number]  Patient presents for 3 month F/U  Patient here to follow chronic medical problems. She's not been monitoring her blood pressure regular basis but it has not been elevated. She would like to try coming off of amlodipine again. At her last visit I also advised her to cut her pravastatin half she was unable to do so therefore continue current dose of 40 mg. She has no specific concerns today. Her flu shot was given at work.   Review Of Systems:  GEN- denies fatigue, fever, weight loss,weakness, recent illness HEENT- denies eye drainage, change in vision, nasal discharge, CVS- denies chest pain, palpitations RESP- denies SOB, cough, wheeze ABD- denies N/V, change in stools, abd pain GU- denies dysuria, hematuria, dribbling, incontinence MSK- denies joint pain, muscle aches, injury Neuro- denies headache, dizziness, syncope, seizure activity       Objective:    BP 130/82  Pulse 72  Temp(Src) 98.3 F (36.8 C) (Oral)  Resp 14  Ht 5\' 4"  (1.626 m)  Wt 169 lb (76.658 kg)  BMI 28.99 kg/m2  LMP 04/17/2014 GEN- NAD, alert and oriented x3 HEENT- PERRL, EOMI, non injected sclera, pink conjunctiva, MMM, oropharynx clear CVS- RRR, no murmur RESP-CTAB EXT- No edema Pulses- Radial 2+        Assessment & Plan:      Problem List Items Addressed This Visit   Insomnia   Hypertension - Primary   Relevant Orders      CBC with Differential      Basic metabolic panel   Hyperlipidemia   Relevant Orders      Lipid panel      Note: This dictation was prepared with Dragon dictation along with smaller phrase technology. Any transcriptional errors that result from this process are unintentional.

## 2014-10-08 NOTE — Assessment & Plan Note (Signed)
Well controlled, no meds this AM and BP looks good, will give trial off Norvasc again, she will monitor at home we will f/u in 2 weeks via phone

## 2014-10-11 ENCOUNTER — Other Ambulatory Visit: Payer: Self-pay | Admitting: *Deleted

## 2014-10-11 MED ORDER — PRAVASTATIN SODIUM 20 MG PO TABS: 20.0000 mg | ORAL_TABLET | Freq: Every day | ORAL | Status: DC

## 2014-10-18 ENCOUNTER — Encounter: Payer: Self-pay | Admitting: Family Medicine

## 2014-11-03 ENCOUNTER — Encounter: Payer: Self-pay | Admitting: Family Medicine

## 2014-11-03 ENCOUNTER — Encounter: Payer: Self-pay | Admitting: *Deleted

## 2014-11-15 ENCOUNTER — Encounter: Payer: Self-pay | Admitting: Family Medicine

## 2014-11-23 ENCOUNTER — Telehealth: Payer: Self-pay | Admitting: Obstetrics and Gynecology

## 2014-11-23 NOTE — Telephone Encounter (Signed)
Pt says she has been having some breast pain. Please call to schedule an appointment.

## 2014-11-23 NOTE — Telephone Encounter (Signed)
Spoke with patient. Patient states that her breasts have been sore/painful for 2 weeks. States that the pain is intermittent. Denies any changes in breast appearance, color, denies any breast lump. States right breast has a small amount of discharge. Patient requesting to come in to see Dr.Silva Thursday or Friday of this week. Appointment scheduled for Thursday 12/10 at 1:15pm with Dr.Silva. Patient is agreeable to date and time.  Routing to provider for final review. Patient agreeable to disposition. Will close encounter

## 2014-11-25 ENCOUNTER — Encounter: Payer: Self-pay | Admitting: Obstetrics and Gynecology

## 2014-11-25 ENCOUNTER — Ambulatory Visit (INDEPENDENT_AMBULATORY_CARE_PROVIDER_SITE_OTHER): Payer: 59 | Admitting: Obstetrics and Gynecology

## 2014-11-25 VITALS — BP 116/70 | HR 66 | Ht 64.0 in | Wt 173.6 lb

## 2014-11-25 DIAGNOSIS — N644 Mastodynia: Secondary | ICD-10-CM

## 2014-11-25 DIAGNOSIS — N6452 Nipple discharge: Secondary | ICD-10-CM

## 2014-11-25 NOTE — Progress Notes (Signed)
Patient is scheduled for Bilateral Breast Diagnostic Mammogram and Bilateral Breast Ultrasound at [facility] on 12/08/14 at 0730 .  [facility] Center requested order for R breast ductogram be placed. Patient with hx of diagnostic imaging at [facility] in 2010, no record of 2012 mammogram in [place]. Patient unsure of location of mammogram done in 2012, feels it was done with breast center.   Patient  agreeable to time/date/location written appointment information given to patient.

## 2014-11-25 NOTE — Progress Notes (Signed)
Patient ID: Mary Robles, female   DOB: [birth year], 38 y.o.   MRN: [id number] GYNECOLOGY VISIT  PCP:   Jenna Luo, MD  Referring provider:   HPI: 38 y.o.   Married  [place] American  female   [phone] with Patient's last menstrual period was 04/17/2014.   here for bilateral breast pain x2 weeks.  Some discharge from right breast.   Pain for two weeks.   Had breast tenderness in the past around her cycle time. White spontaneous drainage from the right nipple from two separate areas of the nipple. No left nipple drainage.   Did a routine mammogram at work in 2012.  Normal.   No family history of breast cancer.   GYNECOLOGIC HISTORY: Patient's last menstrual period was 04/17/2014. Sexually active:  Yes Partner preference: female Contraception:  Tubal, hysterectomy  Menopausal hormone therapy: none DES exposure:  no  Blood transfusions:   no Sexually transmitted diseases:  HSV  GYN procedures and prior surgeries:  Tubal, Cryotherapy age 86 or 3, TAH/BSO Last mammogram:  2012 wnl               Last pap and high risk HPV testing:  01-21-14 wnl:neg HR HPV  History of abnormal pap smear:  Yes, age 47 or 82 (dysplasia) --hx of ?cryotherapy to cervix   OB History    Gravida Para Term Preterm AB TAB SAB Ectopic Multiple Living   4 2   1 1    2        LIFESTYLE: Exercise:               Tobacco:   no Alcohol:      1 drink per week Drug use:   no  Patient Active Problem List   Diagnosis Date Noted  . Hyperlipidemia 07/09/2014  . Insomnia 07/09/2014  . Other malaise and fatigue 07/09/2014  . Nasal congestion 07/09/2014  . Urinary problem 07/09/2014  . Lipoma of abdominal wall s/p excision 05/04/2014 05/19/2014  . Status post total abdominal hysterectomy 05/04/2014  . Hypertension   . Vitamin D deficiency     Past Medical History  Diagnosis Date  . Herpes simplex   . Sickle cell trait   . Anxiety   . Hypertension   . Hyperlysinemia   . Vitamin D deficiency   .  Depression   . Asthma     no symptoms in "years", no inhaler    Past Surgical History  Procedure Laterality Date  . Tubal ligation    . Foot surgery    . Abdominal hysterectomy N/A 05/04/2014    Procedure: HYSTERECTOMY ABDOMINAL with BILATERAL SALPINGECTOMY ;  Surgeon: Everardo All Amundson de Berton Lan, MD;  Location: [facility];  Service: Gynecology;  Laterality: N/A;  . Lipoma excision N/A 05/04/2014    Procedure: EXCISION LIPOMA of abdominal wall;  Surgeon: Jamey Reas de Berton Lan, MD;  Location: [facility];  Service: Gynecology;  Laterality: N/A;  . Laparotomy N/A 05/04/2014    Procedure: REMOVAL OF ABD WALL MASS ;  Surgeon: Adin Hector, MD;  Location: [facility];  Service: General;  Laterality: N/A;    Current Outpatient Prescriptions  Medication Sig Dispense Refill  . benazepril (LOTENSIN) 20 MG tablet Take 20 mg by mouth daily.    . clonazePAM (KLONOPIN) 1 MG tablet Take 1 tablet (1 mg total) by mouth at bedtime as needed for anxiety. 30 tablet 1  . cloNIDine (CATAPRES) 0.1 MG tablet Take 0.1 mg by mouth  2 (two) times daily.    . fluticasone (FLONASE) 50 MCG/ACT nasal spray Place 2 sprays into both nostrils daily. 16 g 6  . pravastatin (PRAVACHOL) 20 MG tablet Take 1 tablet (20 mg total) by mouth daily. TAKE ONE TABLET BY MOUTH ONCE DAILY 30 tablet 2   No current facility-administered medications for this visit.     ALLERGIES: Vicodin and Betadine  Family History  Problem Relation Age of Onset  . Diabetes Mother   . Thyroid disease Mother   . Mental retardation Maternal Aunt   . Diabetes Maternal Grandmother     History   Social History  . Marital Status: Married    Spouse Name: N/A    Number of Children: N/A  . Years of Education: N/A   Occupational History  . Not on file.   Social History Main Topics  . Smoking status: Never Smoker   . Smokeless tobacco: Never Used  . Alcohol Use: 0.6 oz/week    1 Not specified per week     Comment: occ glass of  wine  . Drug Use: No  . Sexual Activity:    Partners: Male    Birth Control/ Protection: Surgical     Comment: BTSP 2005/TAH/BSO   Other Topics Concern  . Not on file   Social History Narrative    ROS:  Pertinent items are noted in HPI.  PHYSICAL EXAMINATION:    BP 116/70 mmHg  Pulse 66  Ht 5\' 4"  (1.626 m)  Wt 173 lb 9.6 oz (78.744 kg)  BMI 29.78 kg/m2  LMP 04/17/2014   Wt Readings from Last 3 Encounters:  11/25/14 173 lb 9.6 oz (78.744 kg)  10/08/14 169 lb (76.658 kg)  09/03/14 167 lb (75.751 kg)     Ht Readings from Last 3 Encounters:  11/25/14 5\' 4"  (1.626 m)  10/08/14 5\' 4"  (1.626 m)  09/03/14 5\' 4"  (1.626 m)    General appearance: alert, cooperative and appears stated age  Breasts: Inspection negative, No nipple retraction or dimpling, No nipple discharge or bleeding, No axillary or supraclavicular adenopathy, Normal to palpation without dominant masses  ASSESSMENT  Bilateral breast pain.  History of right nipple discharge.  PLAN  TSH and prolactin.  Ibuprofen 600 mg po q 6 hours prn breast pain.  Bilateral diagnostic mammogram and breast ultrasound.  Possible right ductogram.   15 minutes face to face time of which over 50% was spent in counseling.   After visit summary to patient.

## 2014-11-26 LAB — PROLACTIN: Prolactin: 9.2 ng/mL

## 2014-11-26 LAB — TSH: TSH: 1.449 u[IU]/mL (ref 0.350–4.500)

## 2014-11-30 ENCOUNTER — Encounter: Payer: Self-pay | Admitting: Family Medicine

## 2014-11-30 ENCOUNTER — Ambulatory Visit (INDEPENDENT_AMBULATORY_CARE_PROVIDER_SITE_OTHER): Payer: 59 | Admitting: Family Medicine

## 2014-11-30 VITALS — BP 138/72 | HR 64 | Temp 98.3°F | Resp 12 | Ht 65.0 in | Wt 171.0 lb

## 2014-11-30 DIAGNOSIS — J0100 Acute maxillary sinusitis, unspecified: Secondary | ICD-10-CM

## 2014-11-30 MED ORDER — AZITHROMYCIN 250 MG PO TABS: ORAL_TABLET | ORAL | Status: DC

## 2014-11-30 NOTE — Progress Notes (Signed)
Patient ID: Mary Robles, female   DOB: [birth year], 38 y.o.   MRN: [id number]   Subjective:    Patient ID: Mary Robles, female    DOB: 09-05-1976, 38 y.o.   MRN: [id number]  Patient presents for Illness Pt here with sinus pressure headache, mininal drainage for past week, no fever, no significant cough, OTC Coricidan and flonase used+ sick contacts     Review Of Systems:  GEN- denies fatigue, fever, weight loss,weakness, recent illness HEENT- denies eye drainage, change in vision, +nasal discharge, CVS- denies chest pain, palpitations RESP- denies SOB, cough, wheeze ABD- denies N/V, change in stools, abd pain Neuro- denies headache, dizziness, syncope, seizure activity       Objective:    BP 138/72 mmHg  Pulse 64  Temp(Src) 98.3 F (36.8 C) (Oral)  Resp 12  Ht 5\' 5"  (1.651 m)  Wt 171 lb (77.565 kg)  BMI 28.46 kg/m2  LMP 04/17/2014 GEN- NAD, alert and oriented x3 HEENT- PERRL, EOMI, non injected sclera, pink conjunctiva, MMM, oropharynx clear  TM clear bilat no effusion,  + maxillary sinus tenderness, inflammed turbinates,  Nasal drainage  Neck- Supple, no LAD CVS- RRR, no murmur RESP-CTAB EXT- No edema Pulses- Radial 2+         Assessment & Plan:      Problem List Items Addressed This Visit    None    Visit Diagnoses    Acute maxillary sinusitis, recurrence not specified    -  Primary    recurrent sinusitis, a little early in the course but based on history will place her on zpak, allow afrin for 2 days to decongest, mucinex BID, flionase    Relevant Medications       azithromycin (ZITHROMAX) tablet       Note: This dictation was prepared with Dragon dictation along with smaller phrase technology. Any transcriptional errors that result from this process are unintentional.

## 2014-11-30 NOTE — Patient Instructions (Signed)
Okay to use a few days of Afrin to open you up Take antibiotics as presribed Mucinex twice a day  F/U as needed

## 2014-12-03 ENCOUNTER — Encounter (HOSPITAL_COMMUNITY): Payer: Self-pay | Admitting: *Deleted

## 2014-12-03 ENCOUNTER — Emergency Department (HOSPITAL_COMMUNITY)
Admission: EM | Admit: 2014-12-03 | Discharge: 2014-12-03 | Disposition: A | Discharge status: Home / Self Care | Payer: 59 | Attending: Emergency Medicine | Admitting: Emergency Medicine

## 2014-12-03 ENCOUNTER — Emergency Department (HOSPITAL_COMMUNITY): Payer: 59

## 2014-12-03 ENCOUNTER — Telehealth: Payer: Self-pay | Admitting: Family Medicine

## 2014-12-03 DIAGNOSIS — I10 Essential (primary) hypertension: Secondary | ICD-10-CM | POA: Insufficient documentation

## 2014-12-03 DIAGNOSIS — Z7982 Long term (current) use of aspirin: Secondary | ICD-10-CM | POA: Diagnosis not present

## 2014-12-03 DIAGNOSIS — E723 Disorders of lysine and hydroxylysine metabolism: Secondary | ICD-10-CM | POA: Diagnosis not present

## 2014-12-03 DIAGNOSIS — F419 Anxiety disorder, unspecified: Secondary | ICD-10-CM | POA: Insufficient documentation

## 2014-12-03 DIAGNOSIS — Z79899 Other long term (current) drug therapy: Secondary | ICD-10-CM | POA: Insufficient documentation

## 2014-12-03 DIAGNOSIS — Z862 Personal history of diseases of the blood and blood-forming organs and certain disorders involving the immune mechanism: Secondary | ICD-10-CM | POA: Insufficient documentation

## 2014-12-03 DIAGNOSIS — Z7951 Long term (current) use of inhaled steroids: Secondary | ICD-10-CM | POA: Diagnosis not present

## 2014-12-03 DIAGNOSIS — R079 Chest pain, unspecified: Secondary | ICD-10-CM

## 2014-12-03 DIAGNOSIS — J45909 Unspecified asthma, uncomplicated: Secondary | ICD-10-CM | POA: Diagnosis not present

## 2014-12-03 DIAGNOSIS — R0789 Other chest pain: Secondary | ICD-10-CM

## 2014-12-03 DIAGNOSIS — Z8619 Personal history of other infectious and parasitic diseases: Secondary | ICD-10-CM | POA: Diagnosis not present

## 2014-12-03 LAB — BASIC METABOLIC PANEL
Anion gap: 14 (ref 5–15)
BUN: 6 mg/dL (ref 6–23)
CALCIUM: 9.2 mg/dL (ref 8.4–10.5)
CO2: 22 mEq/L (ref 19–32)
CREATININE: 0.76 mg/dL (ref 0.50–1.10)
Chloride: 103 mEq/L (ref 96–112)
Glucose, Bld: 90 mg/dL (ref 70–99)
POTASSIUM: 4.2 meq/L (ref 3.7–5.3)
Sodium: 139 mEq/L (ref 137–147)

## 2014-12-03 LAB — I-STAT TROPONIN, ED
Troponin i, poc: 0 ng/mL (ref 0.00–0.08)
Troponin i, poc: 0 ng/mL (ref 0.00–0.08)

## 2014-12-03 LAB — CBC
HEMATOCRIT: 39.8 % (ref 36.0–46.0)
Hemoglobin: 14.2 g/dL (ref 12.0–15.0)
MCH: 28 pg (ref 26.0–34.0)
MCHC: 35.7 g/dL (ref 30.0–36.0)
MCV: 78.5 fL (ref 78.0–100.0)
Platelets: 265 10*3/uL (ref 150–400)
RBC: 5.07 MIL/uL (ref 3.87–5.11)
RDW: 14.4 % (ref 11.5–15.5)
WBC: 7.2 10*3/uL (ref 4.0–10.5)

## 2014-12-03 LAB — D-DIMER, QUANTITATIVE: D-Dimer, Quant: <0.27 ug/mL-FEU (ref 0.00–0.48)

## 2014-12-03 MED ORDER — IBUPROFEN 800 MG PO TABS: 800.0000 mg | ORAL_TABLET | Freq: Three times a day (TID) | ORAL | Status: DC | PRN

## 2014-12-03 MED ORDER — OXYCODONE-ACETAMINOPHEN 5-325 MG PO TABS: 1.0000 | ORAL_TABLET | Freq: Four times a day (QID) | ORAL | Status: DC | PRN

## 2014-12-03 MED ORDER — MORPHINE SULFATE 4 MG/ML IJ SOLN
4.0000 mg | Freq: Once | INTRAMUSCULAR | Status: AC
  Administered 2014-12-03: 4 mg via INTRAVENOUS
  Filled 2014-12-03: qty 1

## 2014-12-03 NOTE — ED Provider Notes (Signed)
CSN: [id number]     Arrival date & time 12/03/14  1004 History   First MD Initiated Contact with Patient 12/03/14 1017     Chief Complaint  Patient presents with  . Chest Pain     (Consider location/radiation/quality/duration/timing/severity/associated sxs/prior Treatment) HPI Patient presents to the emergency department with chest pain that started last night.  Patient states that she has been recently treated for an upper respiratory infection.  She was given Zithromax and told to use Afrin and Mucinex.  The patient states that she noticed that her blood pressure was up yesterday.  Patient states that she has not had any diaphoresis, exertional chest pain, weakness, dizziness, blurred vision, headache, back pain, neck pain, dysuria, abdominal pain, nausea, vomiting, diarrhea, rash, fever, lightheadedness, near syncope or syncope.  The patient states that she did not take any medication prior to arrival.  Patient states that certain movements and palpation make her pain worse.  Patient states nothing seems to make her condition better Past Medical History  Diagnosis Date  . Herpes simplex   . Sickle cell trait   . Anxiety   . Hypertension   . Hyperlysinemia   . Vitamin D deficiency   . Depression   . Asthma     no symptoms in "years", no inhaler   Past Surgical History  Procedure Laterality Date  . Tubal ligation    . Foot surgery    . Abdominal hysterectomy N/A 05/04/2014    Procedure: HYSTERECTOMY ABDOMINAL with BILATERAL SALPINGECTOMY ;  Surgeon: Everardo All Amundson de Berton Lan, MD;  Location: [facility];  Service: Gynecology;  Laterality: N/A;  . Lipoma excision N/A 05/04/2014    Procedure: EXCISION LIPOMA of abdominal wall;  Surgeon: Jamey Reas de Berton Lan, MD;  Location: [facility];  Service: Gynecology;  Laterality: N/A;  . Laparotomy N/A 05/04/2014    Procedure: REMOVAL OF ABD WALL MASS ;  Surgeon: Adin Hector, MD;  Location: [facility];  Service: General;   Laterality: N/A;   Family History  Problem Relation Age of Onset  . Diabetes Mother   . Thyroid disease Mother   . Mental retardation Maternal Aunt   . Diabetes Maternal Grandmother    History  Substance Use Topics  . Smoking status: Never Smoker   . Smokeless tobacco: Never Used  . Alcohol Use: 0.6 oz/week    1 Not specified per week     Comment: occ glass of wine   OB History    Gravida Para Term Preterm AB TAB SAB Ectopic Multiple Living   4 2   1 1    2      Review of Systems All other systems negative except as documented in the HPI. All pertinent positives and negatives as reviewed in the HPI.   Allergies  Vicodin and Betadine  Home Medications   Prior to Admission medications   Medication Sig Start Date End Date Taking? Authorizing Provider  aspirin 81 MG tablet Take 81 mg by mouth once.   Yes Historical Provider, MD  azithromycin (ZITHROMAX) 250 MG tablet Take 2 tablets x 1 day, then 1 tab daily for 4 days 11/30/14  Yes Alycia Rossetti, MD  benazepril (LOTENSIN) 20 MG tablet Take 20 mg by mouth daily.   Yes Historical Provider, MD  clonazePAM (KLONOPIN) 1 MG tablet Take 1 tablet (1 mg total) by mouth at bedtime as needed for anxiety. 07/09/14  Yes Alycia Rossetti, MD  cloNIDine (CATAPRES) 0.1 MG  tablet Take 0.1 mg by mouth 2 (two) times daily.   Yes Historical Provider, MD  fluticasone (FLONASE) 50 MCG/ACT nasal spray Place 2 sprays into both nostrils daily. 09/03/14  Yes Alycia Rossetti, MD  guaiFENesin (MUCINEX) 600 MG 12 hr tablet Take 600 mg by mouth 2 (two) times daily as needed for to loosen phlegm.   Yes Historical Provider, MD  pravastatin (PRAVACHOL) 20 MG tablet Take 1 tablet (20 mg total) by mouth daily. TAKE ONE TABLET BY MOUTH ONCE DAILY 10/11/14  Yes Alycia Rossetti, MD   BP 110/62 mmHg  Pulse 67  Temp(Src) 98.1 F (36.7 C) (Oral)  Resp 14  Ht 5\' 5"  (1.651 m)  Wt 170 lb (77.111 kg)  BMI 28.29 kg/m2  SpO2 99%  LMP 04/17/2014 Physical Exam   Constitutional: She is oriented to person, place, and time. She appears well-developed and well-nourished.  Eyes: Pupils are equal, round, and reactive to light.  Neck: Normal range of motion. Neck supple.  Cardiovascular: Normal rate, regular rhythm and normal heart sounds.  Exam reveals no gallop and no friction rub.   No murmur heard. Pulmonary/Chest: Effort normal and breath sounds normal. No respiratory distress. She has no wheezes. She exhibits tenderness.  Neurological: She is alert and oriented to person, place, and time.  Skin: Skin is warm and dry. No rash noted. No erythema.  Nursing note and vitals reviewed.   ED Course  Procedures (including critical care time) Labs Review Labs Reviewed  CBC  BASIC METABOLIC PANEL  D-DIMER, QUANTITATIVE  I-STAT [place], ED  [facility], ED    Imaging Review Dg Chest 2 View  12/03/2014   CLINICAL DATA:  Chest pain, shortness of Breath. History of sickle cell trait.  EXAM: CHEST  2 VIEW  COMPARISON:  08/23/2014  FINDINGS: The heart size and mediastinal contours are within normal limits. Both lungs are clear. The visualized skeletal structures are unremarkable.  IMPRESSION: No active cardiopulmonary disease.   Electronically Signed   By: Rolm Baptise M.D.   On: 12/03/2014 11:04     EKG Interpretation None       Date: 12/03/2014  Rate: 87  Rhythm: normal sinus rhythm  QRS Axis: normal  Intervals: normal  ST/T Wave abnormalities: nonspecific T wave changes  Conduction Disutrbances:none  Narrative Interpretation:   Old EKG Reviewed: unchanged   The patient has mostly musculoskeletal pain on exam and she does have hypertension but no other risk factors for cardiac chest pain seems atypical, that this would be cardiac in nature.  The patient is PERC negative and low risk based on well's criteria and also has a negative d-dimer.  Patient is advised follow-up with her primary care Dr. told to return here as  needed   Brent General, PA-C 12/03/14 [place]. Alvino Chapel, MD 12/03/14 1620

## 2014-12-03 NOTE — Discharge Instructions (Signed)
Return here as needed.  Follow-up with your primary care doctor.  Your testing here today was normal

## 2014-12-03 NOTE — ED Notes (Signed)
Pt remains monitored by blood pressure, pulse ox, and 5 lead.  

## 2014-12-03 NOTE — ED Notes (Signed)
Patient transported to X-ray 

## 2014-12-03 NOTE — ED Notes (Signed)
Returned from [facility]; PA at bedside

## 2014-12-03 NOTE — ED Notes (Signed)
Pt is here with chest pain and sob that started last nite.  Pt states was taking Afrin for sinus symptoms.

## 2014-12-08 ENCOUNTER — Ambulatory Visit
Admission: RE | Admit: 2014-12-08 | Discharge: 2014-12-08 | Disposition: A | Discharge status: Home / Self Care | Payer: 59 | Source: Ambulatory Visit | Attending: Obstetrics and Gynecology | Admitting: Obstetrics and Gynecology

## 2014-12-08 ENCOUNTER — Other Ambulatory Visit: Payer: Self-pay | Admitting: Obstetrics and Gynecology

## 2014-12-08 DIAGNOSIS — N6310 Unspecified lump in the right breast, unspecified quadrant: Secondary | ICD-10-CM

## 2014-12-08 DIAGNOSIS — N6452 Nipple discharge: Secondary | ICD-10-CM

## 2014-12-08 DIAGNOSIS — N644 Mastodynia: Secondary | ICD-10-CM

## 2014-12-14 ENCOUNTER — Other Ambulatory Visit: Payer: Self-pay | Admitting: Obstetrics and Gynecology

## 2014-12-14 DIAGNOSIS — N6310 Unspecified lump in the right breast, unspecified quadrant: Secondary | ICD-10-CM

## 2014-12-14 DIAGNOSIS — N6452 Nipple discharge: Secondary | ICD-10-CM

## 2014-12-15 ENCOUNTER — Other Ambulatory Visit: Payer: Self-pay | Admitting: Obstetrics and Gynecology

## 2014-12-15 ENCOUNTER — Other Ambulatory Visit: Payer: Self-pay

## 2014-12-15 DIAGNOSIS — N6310 Unspecified lump in the right breast, unspecified quadrant: Secondary | ICD-10-CM

## 2014-12-15 DIAGNOSIS — N6452 Nipple discharge: Secondary | ICD-10-CM

## 2014-12-17 HISTORY — PX: BREAST BIOPSY: SHX20

## 2014-12-22 ENCOUNTER — Ambulatory Visit
Admission: RE | Admit: 2014-12-22 | Discharge: 2014-12-22 | Disposition: A | Discharge status: Home / Self Care | Payer: 59 | Source: Ambulatory Visit | Attending: Obstetrics and Gynecology | Admitting: Obstetrics and Gynecology

## 2014-12-22 DIAGNOSIS — N6310 Unspecified lump in the right breast, unspecified quadrant: Secondary | ICD-10-CM

## 2014-12-22 DIAGNOSIS — N6452 Nipple discharge: Secondary | ICD-10-CM

## 2014-12-27 ENCOUNTER — Telehealth: Payer: Self-pay

## 2014-12-27 NOTE — Telephone Encounter (Signed)
Out of mammogram hold.

## 2014-12-27 NOTE — Telephone Encounter (Signed)
-----   Message from [place], MD sent at 12/23/2014  6:44 PM EST ----- Ok to remove from mammogram hold. Biopsy - benign fibroadenoma.

## 2014-12-27 NOTE — Telephone Encounter (Signed)
Telephone encounter opened in error.  Routing to [employer] to remove from hold.  Will close encounter.

## 2015-01-12 ENCOUNTER — Telehealth: Payer: Self-pay | Admitting: Family Medicine

## 2015-01-12 NOTE — Telephone Encounter (Signed)
Appointment scheduled.

## 2015-01-12 NOTE — Telephone Encounter (Signed)
[phone]  PT is calling because her hands are itching really bad, and she feels like there are sores in her mouth She has changed soaps and mouthwash and nothing seems to help

## 2015-01-13 ENCOUNTER — Ambulatory Visit: Payer: Self-pay | Admitting: Family Medicine

## 2015-01-18 ENCOUNTER — Other Ambulatory Visit: Payer: Self-pay | Admitting: Family Medicine

## 2015-01-18 NOTE — Telephone Encounter (Signed)
Medication refilled per protocol. 

## 2015-01-24 ENCOUNTER — Ambulatory Visit (INDEPENDENT_AMBULATORY_CARE_PROVIDER_SITE_OTHER): Payer: 59 | Admitting: Obstetrics and Gynecology

## 2015-01-24 ENCOUNTER — Ambulatory Visit: Payer: BC Managed Care – PPO | Admitting: Obstetrics and Gynecology

## 2015-01-24 ENCOUNTER — Encounter: Payer: Self-pay | Admitting: Obstetrics and Gynecology

## 2015-01-24 VITALS — BP 134/88 | HR 76 | Resp 18 | Ht 65.5 in | Wt 171.6 lb

## 2015-01-24 DIAGNOSIS — Z0000 Encounter for general adult medical examination without abnormal findings: Secondary | ICD-10-CM

## 2015-01-24 DIAGNOSIS — Z01419 Encounter for gynecological examination (general) (routine) without abnormal findings: Secondary | ICD-10-CM

## 2015-01-24 LAB — POCT URINALYSIS DIPSTICK
Bilirubin, UA: NEGATIVE
Blood, UA: NEGATIVE
Glucose, UA: NEGATIVE
KETONES UA: NEGATIVE
LEUKOCYTES UA: NEGATIVE
NITRITE UA: NEGATIVE
PH UA: 5
PROTEIN UA: NEGATIVE
Urobilinogen, UA: NEGATIVE

## 2015-01-24 MED ORDER — FAMCICLOVIR 125 MG PO TABS: ORAL_TABLET | ORAL | Status: DC

## 2015-01-24 NOTE — Progress Notes (Signed)
Patient ID: Mary Robles, female   DOB: [birth year], 39 y.o.   MRN: [id number] 39 y.o. [id number] MarriedAfrican AmericanF here for annual exam.    Patient had right breast pain and had mammogram showing mass. Status post right breast biopsy on 12/22/14 - benign fibroadenoma.  No more nipple discharge and or more breast pain currently.  Prior prolactin 9.2 and TSH 1.419. No ductogram was performed.   Having monthly bloating. Ibuprofen helps.   Having mouth blisters once a year.  Having a fever blister on her mouth.  Used Famvir in the past.  Uses antiviral for HSV II.  Had had HSV since age 58.   Has an appointment with PCP for HTN.  Will also evaluate for anxiety.  Took Cymbalta in the past and may return to it.  Not suicidal.  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   PCP:  [place]  Patient's last menstrual period was 04/17/2014.          Sexually active: Yes.   female partner The current method of family planning is tubal ligation and S/P TAH/Bilateral salpingectomy.    Exercising: No.  none. Smoker:  no  Health Maintenance: Pap:  01-21-14 wnl:neg HR HPV History of abnormal Pap:  Yes, age 79 or 66 hx dysplasia--cryotherapy to cervix. MMG:  12-08-14 Diag. Mmg; heterogeneously dense;irregular mass at 12 o'clock right breast biopsies--revealed a fibroadenoma.  Nipple discharge likely represents hormonal process. Colonoscopy:  never BMD:   never TDaP:  2013 Screening Labs:   Hb today: PCP, Urine today:  Neg   reports that she has never smoked. She has never used smokeless tobacco. She reports that she does not drink alcohol or use illicit drugs.  Past Medical History  Diagnosis Date  . Herpes simplex   . Sickle cell trait   . Anxiety   . Hypertension   . Hyperlysinemia   . Vitamin D deficiency   . Depression   . Asthma     no symptoms in "years", no inhaler    Past Surgical History  Procedure Laterality Date  . Tubal  ligation    . Foot surgery    . Abdominal hysterectomy N/A 05/04/2014    Procedure: HYSTERECTOMY ABDOMINAL with BILATERAL SALPINGECTOMY ;  Surgeon: Everardo All Amundson de Berton Lan, MD;  Location: [facility];  Service: Gynecology;  Laterality: N/A;  . Lipoma excision N/A 05/04/2014    Procedure: EXCISION LIPOMA of abdominal wall;  Surgeon: Jamey Reas de Berton Lan, MD;  Location: [facility];  Service: Gynecology;  Laterality: N/A;  . Laparotomy N/A 05/04/2014    Procedure: REMOVAL OF ABD WALL MASS ;  Surgeon: Adin Hector, MD;  Location: [facility];  Service: General;  Laterality: N/A;    Current Outpatient Prescriptions  Medication Sig Dispense Refill  . benazepril (LOTENSIN) 20 MG tablet Take 20 mg by mouth daily.    . clonazePAM (KLONOPIN) 1 MG tablet Take 1 tablet (1 mg total) by mouth at bedtime as needed for anxiety. 30 tablet 1  . cloNIDine (CATAPRES) 0.1 MG tablet Take 0.1 mg by mouth 2 (two) times daily.    . fluticasone (FLONASE) 50 MCG/ACT nasal spray Place 2 sprays into both nostrils daily. 16 g 6  . ibuprofen (ADVIL,MOTRIN) 800 MG tablet Take 1 tablet (800 mg total) by mouth every 8 (eight) hours as needed. 21 tablet 0  . pravastatin (PRAVACHOL) 20 MG tablet TAKE ONE TABLET BY MOUTH  ONCE DAILY 30 tablet 0   No current facility-administered medications for this visit.    Family History  Problem Relation Age of Onset  . Diabetes Mother   . Thyroid disease Mother   . Mental retardation Maternal Aunt   . Diabetes Maternal Grandmother     ROS:  Pertinent items are noted in HPI.  Otherwise, a comprehensive ROS was negative.  Exam:   BP 134/88 mmHg  Pulse 76  Resp 18  Ht 5' 5.5" (1.664 m)  Wt 171 lb 9.6 oz (77.837 kg)  BMI 28.11 kg/m2  LMP 04/17/2014     Height: 5' 5.5" (166.4 cm)  Ht Readings from Last 3 Encounters:  01/24/15 5' 5.5" (1.664 m)  12/03/14 5\' 5"  (1.651 m)  11/30/14 5\' 5"  (1.651 m)    General appearance: alert, cooperative and appears stated  age Head: Normocephalic, without obvious abnormality, atraumatic Neck: no adenopathy, supple, symmetrical, trachea midline and thyroid normal to inspection and palpation Lungs: clear to auscultation bilaterally Breasts: normal appearance, no masses or tenderness, Inspection negative, No nipple retraction or dimpling, No nipple discharge or bleeding, No axillary or supraclavicular adenopathy Heart: regular rate and rhythm Abdomen: Pfannenstiel incision, soft, non-tender; bowel sounds normal; no masses,  no organomegaly Extremities: extremities normal, atraumatic, no cyanosis or edema Skin: Skin color, texture, turgor normal. No rashes or lesions Lymph nodes: Cervical, supraclavicular, and axillary nodes normal. No abnormal inguinal nodes palpated Neurologic: Grossly normal   Pelvic: External genitalia:  no lesions              Urethra:  normal appearing urethra with no masses, tenderness or lesions              Bartholins and Skenes: normal                 Vagina: normal appearing vagina with normal color and discharge, no lesions              Cervix: absent              Pap taken: No. Bimanual Exam:  Uterus:  normal size, contour, position, consistency, mobility, non-tender              Adnexa: normal adnexa and no mass, fullness, tenderness               Rectovaginal: Confirms               Anus:  normal sphincter tone, no lesions  Chaperone was present for exam.  A:  Well Woman with normal exam Status post TAH/bilateral salpingectomy. Right breast fibroadenoma.  HSV II.  Situational stress.    P:   Mammogram yearly.  pap smear not indicated.  Famvir 500 mg then 250 mg po bid for 2 more days.  See orders.  Will see PCP for treatment of stress and anxiety. return annually or prn

## 2015-01-24 NOTE — Patient Instructions (Signed)

## 2015-01-25 ENCOUNTER — Telehealth: Payer: Self-pay | Admitting: *Deleted

## 2015-01-25 ENCOUNTER — Other Ambulatory Visit: Payer: Self-pay | Admitting: Obstetrics and Gynecology

## 2015-01-25 MED ORDER — FAMCICLOVIR 250 MG PO TABS: ORAL_TABLET | ORAL | Status: DC

## 2015-01-25 NOTE — Telephone Encounter (Signed)
Incoming Fax from [facility]  wanting to verify directions and clarify dosage on Famvir 125 mg.  Sig was written as: Take 2 tablets (500 mg total) once, Then Take one tablet (250 mg) by mouth twice a day for 2 more days.  Dr. Quincy Simmonds please advise sig and medication dosage if possible.

## 2015-01-25 NOTE — Telephone Encounter (Signed)
Correction sent to her pharmacy by me now. It should be Famvir 250 mg tabs, take 2 for first dose and then take 1 tab po bid for the following 2 days.

## 2015-01-25 NOTE — Telephone Encounter (Signed)
[facility] and s/w [facility] she confirmed that they do have the new rx that was sent in.  Thank You! - Encounter closed.

## 2015-02-08 ENCOUNTER — Ambulatory Visit (INDEPENDENT_AMBULATORY_CARE_PROVIDER_SITE_OTHER): Payer: 59 | Admitting: Family Medicine

## 2015-02-08 ENCOUNTER — Encounter: Payer: Self-pay | Admitting: Family Medicine

## 2015-02-08 VITALS — BP 130/88 | HR 76 | Temp 98.4°F | Resp 14 | Ht 66.0 in | Wt 174.0 lb

## 2015-02-08 DIAGNOSIS — I10 Essential (primary) hypertension: Secondary | ICD-10-CM

## 2015-02-08 DIAGNOSIS — F411 Generalized anxiety disorder: Secondary | ICD-10-CM

## 2015-02-08 DIAGNOSIS — G4700 Insomnia, unspecified: Secondary | ICD-10-CM

## 2015-02-08 DIAGNOSIS — R220 Localized swelling, mass and lump, head: Secondary | ICD-10-CM

## 2015-02-08 DIAGNOSIS — E785 Hyperlipidemia, unspecified: Secondary | ICD-10-CM

## 2015-02-08 DIAGNOSIS — K130 Diseases of lips: Secondary | ICD-10-CM

## 2015-02-08 LAB — COMPREHENSIVE METABOLIC PANEL
ALK PHOS: 41 U/L (ref 39–117)
ALT: 10 U/L (ref 0–35)
AST: 14 U/L (ref 0–37)
Albumin: 3.9 g/dL (ref 3.5–5.2)
BILIRUBIN TOTAL: 0.9 mg/dL (ref 0.2–1.2)
BUN: 9 mg/dL (ref 6–23)
CO2: 20 mEq/L (ref 19–32)
CREATININE: 0.78 mg/dL (ref 0.50–1.10)
Calcium: 9 mg/dL (ref 8.4–10.5)
Chloride: 104 mEq/L (ref 96–112)
Glucose, Bld: 76 mg/dL (ref 70–99)
Potassium: 4.4 mEq/L (ref 3.5–5.3)
Sodium: 136 mEq/L (ref 135–145)
Total Protein: 6.8 g/dL (ref 6.0–8.3)

## 2015-02-08 LAB — CBC WITH DIFFERENTIAL/PLATELET
BASOS ABS: 0 10*3/uL (ref 0.0–0.1)
Basophils Relative: 0 % (ref 0–1)
EOS PCT: 2 % (ref 0–5)
Eosinophils Absolute: 0.1 10*3/uL (ref 0.0–0.7)
HCT: 42.9 % (ref 36.0–46.0)
Hemoglobin: 14.5 g/dL (ref 12.0–15.0)
Lymphocytes Relative: 37 % (ref 12–46)
Lymphs Abs: 2.7 10*3/uL (ref 0.7–4.0)
MCH: 28.2 pg (ref 26.0–34.0)
MCHC: 33.8 g/dL (ref 30.0–36.0)
MCV: 83.5 fL (ref 78.0–100.0)
MPV: 10.5 fL (ref 8.6–12.4)
Monocytes Absolute: 0.6 10*3/uL (ref 0.1–1.0)
Monocytes Relative: 9 % (ref 3–12)
Neutro Abs: 3.7 10*3/uL (ref 1.7–7.7)
Neutrophils Relative %: 52 % (ref 43–77)
PLATELETS: 281 10*3/uL (ref 150–400)
RBC: 5.14 MIL/uL — ABNORMAL HIGH (ref 3.87–5.11)
RDW: 14.8 % (ref 11.5–15.5)
WBC: 7.2 10*3/uL (ref 4.0–10.5)

## 2015-02-08 LAB — LIPID PANEL
Cholesterol: 124 mg/dL (ref 0–200)
HDL: 38 mg/dL — AB (ref >=46)
LDL Cholesterol: 73 mg/dL (ref 0–99)
Total CHOL/HDL Ratio: 3.3 Ratio
Triglycerides: 63 mg/dL (ref <150)
VLDL: 13 mg/dL (ref 0–40)

## 2015-02-08 MED ORDER — PRAVASTATIN SODIUM 20 MG PO TABS: 20.0000 mg | ORAL_TABLET | Freq: Every day | ORAL | Status: DC

## 2015-02-08 MED ORDER — BENAZEPRIL HCL 20 MG PO TABS: 20.0000 mg | ORAL_TABLET | Freq: Every day | ORAL | Status: DC

## 2015-02-08 MED ORDER — CLONAZEPAM 1 MG PO TABS: 1.0000 mg | ORAL_TABLET | Freq: Every evening | ORAL | Status: DC | PRN

## 2015-02-08 MED ORDER — CLONIDINE HCL 0.1 MG PO TABS: 0.1000 mg | ORAL_TABLET | Freq: Two times a day (BID) | ORAL | Status: DC

## 2015-02-08 MED ORDER — ESCITALOPRAM OXALATE 10 MG PO TABS: 10.0000 mg | ORAL_TABLET | Freq: Every day | ORAL | Status: DC

## 2015-02-08 NOTE — Assessment & Plan Note (Signed)
Blood pressure is well-controlled and change

## 2015-02-08 NOTE — Assessment & Plan Note (Signed)
Check fasting labs. We also discussed dietary changes that can be made she will like to come off medication eventually

## 2015-02-08 NOTE — Assessment & Plan Note (Signed)
Increased stresses at work which is causing her have mood changes and interfere with her sleep. Start Lexapro 10 mg at bedtime. She'll continue the Klonopin half a tablet as needed

## 2015-02-08 NOTE — Progress Notes (Signed)
Patient ID: Mary Robles, female   DOB: 25-Mar-1976, 39 y.o.   MRN: [id number]   Subjective:    Patient ID: Mary Robles, female    DOB: 24-Sep-1976, 39 y.o.   MRN: [id number]  Patient presents for 4 month F/U  patient to follow-up medications. She is tolerating her blood pressure cholesterol medicines without any difficulties. She continues to have problems with her sleep with her new job they're more stressors and she feels like she is not concentrating very well. She brings distress home it is often very moody and this is displayed on her family. She states that she was on Zoloft in the past but it did not work very well she was on Cymbalta which helps but it made her legs jump. She feels that she may need some type medication to help stabilize her mood. She does take the Klonopin however tip with takes a half a tablet because one tablet makes her very sleepy and she will only do this at bedtime. She did try things like melatonin and other over-the-counter medications but these did not help with her sleep.  A few weeks ago she broke out with blisters on her mouth which look like fever blisters she had a mild illness before that. She however continues to have a darkness around her lips and she also has some tingling and itching around her lips. She never had any swelling besides the blisters no swelling of the mouth or tongue no difficulty breathing    Review Of Systems:  GEN- + fatigue, fever, weight loss,weakness, recent illness HEENT- denies eye drainage, change in vision, nasal discharge, CVS- denies chest pain, palpitations RESP- denies SOB, cough, wheeze ABD- denies N/V, change in stools, abd pain GU- denies dysuria, hematuria, dribbling, incontinence MSK- denies joint pain, muscle aches, injury Neuro- denies headache, dizziness, syncope, seizure activity       Objective:    BP 130/88 mmHg  Pulse 76  Temp(Src) 98.4 F (36.9 C) (Oral)  Resp 14  Ht 5\' 6"  (1.676 m)  Wt 174  lb (78.926 kg)  BMI 28.10 kg/m2  LMP 04/17/2014 GEN- NAD, alert and oriented x3 HEENT- PERRL, EOMI, non injected sclera, pink conjunctiva, MMM, oropharynx clear, hyperpigmentation of rim of lips, small blister right lower lip, no rash on edges or periorally Neck- Supple, no thyromegaly, no LAD CVS- RRR, no murmur RESP-CTAB Psych- stressed appearing, not as upbeat, fatigued, no SI, not anxious, well groomed, normal speech EXT- No edema Pulses- Radial 2+        Assessment & Plan:      Problem List Items Addressed This Visit      Unprioritized   Insomnia   Hypertension - Primary    Blood pressure is well-controlled and change      Relevant Medications   cloNIDine (CATAPRES) tablet   pravastatin (PRAVACHOL) tablet   benazepril (LOTENSIN) tablet   Other Relevant Orders   CBC with Differential/Platelet   Comprehensive metabolic panel   Hyperlipidemia    Check fasting labs. We also discussed dietary changes that can be made she will like to come off medication eventually      Relevant Medications   cloNIDine (CATAPRES) tablet   pravastatin (PRAVACHOL) tablet   benazepril (LOTENSIN) tablet   Other Relevant Orders   Lipid panel   GAD (generalized anxiety disorder)    Increased stresses at work which is causing her have mood changes and interfere with her sleep. Start Lexapro 10 mg at bedtime. She'll continue the Klonopin  half a tablet as needed       Other Visit Diagnoses    Lip swelling        Fever blisters have resolved, I think this may have been more HSV I, breakout, though she stillhas some tingling/itching on edges of mouth, topical 1% cortisone to be applied, >? Related to ACEI but no acutual angioedema, if no iprovement will give trial off       Note: This dictation was prepared with Dragon dictation along with smaller phrase technology. Any transcriptional errors that result from this process are unintentional.

## 2015-02-08 NOTE — Patient Instructions (Signed)
Try lexapro at bedtime Continue with klonopin I will send lab results through mychart Carve out 30 minutes of day for exercise Eat least 3 meals a day  F/U 2 months

## 2015-02-17 ENCOUNTER — Ambulatory Visit (INDEPENDENT_AMBULATORY_CARE_PROVIDER_SITE_OTHER): Payer: 59 | Admitting: Physician Assistant

## 2015-02-17 ENCOUNTER — Encounter: Payer: Self-pay | Admitting: Physician Assistant

## 2015-02-17 ENCOUNTER — Encounter: Payer: Self-pay | Admitting: Family Medicine

## 2015-02-17 VITALS — BP 124/90 | HR 64 | Temp 98.3°F | Resp 18 | Wt 167.0 lb

## 2015-02-17 DIAGNOSIS — J988 Other specified respiratory disorders: Secondary | ICD-10-CM | POA: Diagnosis not present

## 2015-02-17 DIAGNOSIS — B9689 Other specified bacterial agents as the cause of diseases classified elsewhere: Secondary | ICD-10-CM

## 2015-02-17 MED ORDER — AZITHROMYCIN 250 MG PO TABS: ORAL_TABLET | ORAL | Status: DC

## 2015-02-17 NOTE — Progress Notes (Signed)
    Patient ID: Mary Robles MRN: [id number], DOB: 10/17/1976, 39 y.o. Date of Encounter: 02/17/2015, 1:02 PM    Chief Complaint:  Chief Complaint  Patient presents with  . sick x 4 days    sinus infection,  HA, pressure, yellow-bloody drainage     HPI: 39 y.o. year old AA female presents with above symptoms. Says that she had some sore throat and cough for the first 2 days but now those the sore throat has resolved and the cough is pretty much resolved. Now having sinus pressure around the right eye mostly. Nasal congestion with thick yellow mucus. Symptoms not improving and seemed to only be worsening. Works with [employer] -- direct contact with family.     Home Meds:   Outpatient Prescriptions Prior to Visit  Medication Sig Dispense Refill  . benazepril (LOTENSIN) 20 MG tablet Take 1 tablet (20 mg total) by mouth daily. 30 tablet 6  . clonazePAM (KLONOPIN) 1 MG tablet Take 1 tablet (1 mg total) by mouth at bedtime as needed for anxiety. 30 tablet 1  . cloNIDine (CATAPRES) 0.1 MG tablet Take 1 tablet (0.1 mg total) by mouth 2 (two) times daily. 60 tablet 6  . escitalopram (LEXAPRO) 10 MG tablet Take 1 tablet (10 mg total) by mouth at bedtime. 30 tablet 3  . pravastatin (PRAVACHOL) 20 MG tablet Take 1 tablet (20 mg total) by mouth daily. 30 tablet 6   No facility-administered medications prior to visit.    Allergies:  Allergies  Allergen Reactions  . Vicodin [Hydrocodone-Acetaminophen] Other (See Comments)    Hallucination  . Betadine [Povidone Iodine]     Reaction:Blisters      Review of Systems: See HPI for pertinent ROS. All other ROS negative.    Physical Exam: Blood pressure 124/90, pulse 64, temperature 98.3 F (36.8 C), temperature source Oral, resp. rate 18, weight 167 lb (75.751 kg), last menstrual period 04/17/2014., Body mass index is 26.97 kg/(m^2). General:  WNWD AAF. Appears in no acute distress. HEENT: Normocephalic, atraumatic, eyes  without discharge, sclera non-icteric, nares are without discharge. Bilateral auditory canals clear, TM's are without perforation, pearly grey and translucent with reflective cone of light bilaterally. Oral cavity moist, posterior pharynx without exudate, erythema, peritonsillar abscess. No tenderness with percussion of frontal or maxillary sinuses bilaterally.  Neck: Supple. No thyromegaly. No lymphadenopathy. Lungs: Clear bilaterally to auscultation without wheezes, rales, or rhonchi. Breathing is unlabored. Heart: Regular rhythm. No murmurs, rubs, or gallops. Msk:  Strength and tone normal for age. Extremities/Skin: Warm and dry.  Neuro: Alert and oriented X 3. Moves all extremities spontaneously. Gait is normal. CNII-XII grossly in tact. Psych:  Responds to questions appropriately with a normal affect.     ASSESSMENT AND PLAN:  39 y.o. year old female with  1. Bacterial respiratory infection Take antibiotic as directed and complete all of it. Can use over-the-counter medications as needed for symptom relief in the meantime. Of note to be out of work for today and tomorrow (tomorrow Friday). Follow-up if symptoms do not resolve in one week after completion of antibiotic. - azithromycin (ZITHROMAX) 250 MG tablet; Day 1: take 2 daily.  Days 2-5: Take 1 daily.  Dispense: 6 tablet; Refill: 0   Signed, [address] [place], [place], [facility] 02/17/2015 1:02 PM

## 2015-03-16 ENCOUNTER — Ambulatory Visit (INDEPENDENT_AMBULATORY_CARE_PROVIDER_SITE_OTHER): Payer: Worker's Compensation | Admitting: Physician Assistant

## 2015-03-16 VITALS — BP 136/90 | HR 76 | Temp 98.0°F | Resp 14 | Ht 66.5 in | Wt 167.4 lb

## 2015-03-16 DIAGNOSIS — S39012A Strain of muscle, fascia and tendon of lower back, initial encounter: Secondary | ICD-10-CM | POA: Diagnosis not present

## 2015-03-16 MED ORDER — CYCLOBENZAPRINE HCL 5 MG PO TABS: 5.0000 mg | ORAL_TABLET | Freq: Three times a day (TID) | ORAL | Status: DC | PRN

## 2015-03-16 MED ORDER — MELOXICAM 15 MG PO TABS: 15.0000 mg | ORAL_TABLET | Freq: Every day | ORAL | Status: DC

## 2015-03-16 NOTE — Patient Instructions (Signed)
Take mobic once a day. Take flexeril three times a day - may double dose to help with sleep. Heat and gentle massage can help. Return on 03/20/15 for recheck to see if you are able to return to work.
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
## 2015-03-20 ENCOUNTER — Ambulatory Visit (INDEPENDENT_AMBULATORY_CARE_PROVIDER_SITE_OTHER): Payer: Worker's Compensation | Admitting: Emergency Medicine

## 2015-03-20 VITALS — BP 136/83 | HR 87 | Temp 97.8°F | Resp 20 | Ht 66.0 in | Wt 170.5 lb

## 2015-03-20 DIAGNOSIS — S39012S Strain of muscle, fascia and tendon of lower back, sequela: Secondary | ICD-10-CM | POA: Diagnosis not present

## 2015-03-20 NOTE — Patient Instructions (Signed)
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
## 2015-04-11 ENCOUNTER — Ambulatory Visit (INDEPENDENT_AMBULATORY_CARE_PROVIDER_SITE_OTHER): Payer: 59 | Admitting: Family Medicine

## 2015-04-11 ENCOUNTER — Encounter: Payer: Self-pay | Admitting: Family Medicine

## 2015-04-11 VITALS — BP 126/70 | HR 84 | Temp 98.4°F | Resp 14 | Ht 66.0 in | Wt 169.0 lb

## 2015-04-11 DIAGNOSIS — F411 Generalized anxiety disorder: Secondary | ICD-10-CM

## 2015-04-11 DIAGNOSIS — J45998 Other asthma: Secondary | ICD-10-CM | POA: Diagnosis not present

## 2015-04-11 DIAGNOSIS — Z23 Encounter for immunization: Secondary | ICD-10-CM | POA: Diagnosis not present

## 2015-04-11 DIAGNOSIS — J45909 Unspecified asthma, uncomplicated: Secondary | ICD-10-CM

## 2015-04-11 MED ORDER — ESCITALOPRAM OXALATE 20 MG PO TABS: 20.0000 mg | ORAL_TABLET | Freq: Every day | ORAL | Status: DC

## 2015-04-11 NOTE — Progress Notes (Signed)
Patient ID: Martin Smeal, female   DOB: [birth year], 39 y.o.   MRN: [id number]   Subjective:    Patient ID: [name], female    DOB: 1976/11/09, 39 y.o.   MRN: [id number]  Patient presents for No chief complaint on file.  Pt here to f/u Lexapro started on 10mg  last visit, also has klonopin 1 mg which uses 1/2 tablet at bedtime as needed. She does not think the lexapro is doing very much she still feels "irritated". 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 Sleep is fair when she uses the klonopin. She has history of asthma, requested pneumonia vaccine. Has not had any difficulty with her asthma No current inhaler use   Review Of Systems:  GEN- denies fatigue, fever, weight loss,weakness, recent illness HEENT- denies eye drainage, change in vision, nasal discharge, CVS- denies chest pain, palpitations RESP- denies SOB, cough, wheeze ABD- denies N/V, change in stools, abd pain GU- denies dysuria, hematuria, dribbling, incontinence MSK- denies joint pain, muscle aches, injury Neuro- denies headache, dizziness, syncope, seizure activity       Objective:    LMP 04/17/2014 GEN- NAD, alert and oriented x3 CVS-RRR, no murmur RESP-CTAB Psych- Normal affect and mood, no hallucinations, well groomed, normal speech, thought process normal       Assessment & Plan:      Problem List Items Addressed This Visit    GAD (generalized anxiety disorder) - Primary      Note: This dictation was prepared with Dragon dictation along with smaller phrase technology. Any transcriptional errors that result from this process are unintentional.

## 2015-04-11 NOTE — Assessment & Plan Note (Addendum)
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

## 2015-04-11 NOTE — Addendum Note (Signed)
Addended by: Sheral Flow on: 04/11/2015 08:48 AM   Modules accepted: Orders

## 2015-04-11 NOTE — Patient Instructions (Signed)
Lexapro increased to 20mg   Continue all other medications Pneumonia vaccine given F/U 3 months in office

## 2015-04-11 NOTE — Assessment & Plan Note (Signed)
Very rare intermittent symptoms Will given pneumonia vaccine 23 today

## 2015-07-12 ENCOUNTER — Ambulatory Visit (INDEPENDENT_AMBULATORY_CARE_PROVIDER_SITE_OTHER): Payer: 59 | Admitting: Family Medicine

## 2015-07-12 ENCOUNTER — Encounter: Payer: Self-pay | Admitting: Family Medicine

## 2015-07-12 VITALS — BP 136/80 | HR 66 | Temp 98.2°F | Resp 14 | Ht 66.0 in | Wt 170.0 lb

## 2015-07-12 DIAGNOSIS — F411 Generalized anxiety disorder: Secondary | ICD-10-CM | POA: Diagnosis not present

## 2015-07-12 DIAGNOSIS — J0100 Acute maxillary sinusitis, unspecified: Secondary | ICD-10-CM | POA: Diagnosis not present

## 2015-07-12 DIAGNOSIS — E785 Hyperlipidemia, unspecified: Secondary | ICD-10-CM | POA: Diagnosis not present

## 2015-07-12 DIAGNOSIS — E559 Vitamin D deficiency, unspecified: Secondary | ICD-10-CM | POA: Diagnosis not present

## 2015-07-12 DIAGNOSIS — I10 Essential (primary) hypertension: Secondary | ICD-10-CM

## 2015-07-12 LAB — COMPREHENSIVE METABOLIC PANEL
ALBUMIN: 3.9 g/dL (ref 3.6–5.1)
ALT: 14 U/L (ref 6–29)
AST: 17 U/L (ref 10–30)
Alkaline Phosphatase: 51 U/L (ref 33–115)
BUN: 10 mg/dL (ref 7–25)
CALCIUM: 9.1 mg/dL (ref 8.6–10.2)
CHLORIDE: 104 meq/L (ref 98–110)
CO2: 23 meq/L (ref 20–31)
Creat: 0.86 mg/dL (ref 0.50–1.10)
Glucose, Bld: 84 mg/dL (ref 70–99)
Potassium: 4 mEq/L (ref 3.5–5.3)
Sodium: 136 mEq/L (ref 135–146)
Total Bilirubin: 0.6 mg/dL (ref 0.2–1.2)
Total Protein: 6.9 g/dL (ref 6.1–8.1)

## 2015-07-12 LAB — LIPID PANEL
Cholesterol: 140 mg/dL (ref 125–200)
HDL: 40 mg/dL — AB (ref >=46)
LDL CALC: 83 mg/dL (ref <130)
TRIGLYCERIDES: 84 mg/dL (ref <150)
Total CHOL/HDL Ratio: 3.5 Ratio (ref <=5.0)
VLDL: 17 mg/dL (ref <30)

## 2015-07-12 LAB — CBC WITH DIFFERENTIAL/PLATELET
Basophils Absolute: 0 10*3/uL (ref 0.0–0.1)
Basophils Relative: 0 % (ref 0–1)
Eosinophils Absolute: 0.2 10*3/uL (ref 0.0–0.7)
Eosinophils Relative: 3 % (ref 0–5)
HCT: 42.7 % (ref 36.0–46.0)
Hemoglobin: 14.3 g/dL (ref 12.0–15.0)
Lymphocytes Relative: 41 % (ref 12–46)
Lymphs Abs: 2.7 10*3/uL (ref 0.7–4.0)
MCH: 28.2 pg (ref 26.0–34.0)
MCHC: 33.5 g/dL (ref 30.0–36.0)
MCV: 84.2 fL (ref 78.0–100.0)
MONO ABS: 0.6 10*3/uL (ref 0.1–1.0)
MONOS PCT: 9 % (ref 3–12)
MPV: 10.1 fL (ref 8.6–12.4)
Neutro Abs: 3.1 10*3/uL (ref 1.7–7.7)
Neutrophils Relative %: 47 % (ref 43–77)
PLATELETS: 299 10*3/uL (ref 150–400)
RBC: 5.07 MIL/uL (ref 3.87–5.11)
RDW: 15 % (ref 11.5–15.5)
WBC: 6.6 10*3/uL (ref 4.0–10.5)

## 2015-07-12 MED ORDER — AMOXICILLIN 875 MG PO TABS: 875.0000 mg | ORAL_TABLET | Freq: Two times a day (BID) | ORAL | Status: DC

## 2015-07-12 MED ORDER — FLUCONAZOLE 150 MG PO TABS: 150.0000 mg | ORAL_TABLET | Freq: Once | ORAL | Status: DC

## 2015-07-12 NOTE — Patient Instructions (Signed)
Continue current medications Take antibiotics I will call with lab results F/U 6 months

## 2015-07-12 NOTE — Progress Notes (Signed)
Patient ID: Mary Robles, female   DOB: [birth year], 39 y.o.   MRN: [id number]   Subjective:    Patient ID: Mary Robles, female    DOB: Feb 02, 1976, 39 y.o.   MRN: [id number]  Patient presents for 3 month F/U; Illness; and Vertigo  patient to follow-up medications. Complains of sinus pressure and drainage left ear pain for the past week. The week before that she actually had a mild cold which most of those symptoms have resolved. On Saturday she was with her family when she got really dizzy she was standing out in the heat and was also having the sinus pressure is worse bending but resolved after she sat down and was in the shade. She's not had any other symptoms since then.    Review Of Systems:  GEN- denies fatigue, fever, weight loss,weakness, recent illness HEENT- denies eye drainage, change in vision,+ nasal discharge, CVS- denies chest pain, palpitations RESP- denies SOB, cough, wheeze ABD- denies N/V, change in stools, abd pain GU- denies dysuria, hematuria, dribbling, incontinence MSK- denies joint pain, muscle aches, injury Neuro- denies headache, dizziness, syncope, seizure activity       Objective:    BP 136/80 mmHg  Pulse 66  Temp(Src) 98.2 F (36.8 C) (Oral)  Resp 14  Ht 5\' 6"  (1.676 m)  Wt 170 lb (77.111 kg)  BMI 27.45 kg/m2  LMP 04/17/2014 GEN- NAD, alert and oriented x3 HEENT- PERRL, EOMI, non injected sclera, pink conjunctiva, MMM, oropharynx clear, TM clear bilat no effusion,  + right  maxillary sinus tenderness/ right frontal tenderness, inflammed turbinates,  Nasal drainage  Neck- Supple, no LAD CVS- RRR, no murmur RESP-CTAB Psych- normal affect and mood  EXT- No edema Pulses- Radial 2+         Assessment & Plan:      Problem List Items Addressed This Visit    Vitamin D deficiency   Relevant Orders   Vitamin D, 25-hydroxy (Completed)   Hypertension - Primary    Well controlled, no change to meds      Relevant Orders   CBC with  Differential/Platelet (Completed)   Comprehensive metabolic panel (Completed)   Hyperlipidemia   Relevant Orders   Lipid panel (Completed)   GAD (generalized anxiety disorder)    Continue lexapro and klonopin       Other Visit Diagnoses    Acute maxillary sinusitis, recurrence not specified        Treat with antibiotics, nasal sray, mucinex, may have been related to dizzy spell this past weekend as well,     Relevant Medications    amoxicillin (AMOXIL) 875 MG tablet    fluconazole (DIFLUCAN) 150 MG tablet       Note: This dictation was prepared with Dragon dictation along with smaller phrase technology. Any transcriptional errors that result from this process are unintentional.

## 2015-07-13 ENCOUNTER — Encounter: Payer: Self-pay | Admitting: Family Medicine

## 2015-07-13 LAB — VITAMIN D 25 HYDROXY (VIT D DEFICIENCY, FRACTURES): VIT D 25 HYDROXY: 19 ng/mL — AB (ref 30–100)

## 2015-07-13 NOTE — Assessment & Plan Note (Signed)
Well controlled, no change to meds 

## 2015-07-13 NOTE — Assessment & Plan Note (Signed)
Continue lexapro and klonopin

## 2015-07-14 ENCOUNTER — Other Ambulatory Visit: Payer: Self-pay | Admitting: *Deleted

## 2015-07-14 MED ORDER — VITAMIN D (ERGOCALCIFEROL) 1.25 MG (50000 UNIT) PO CAPS: ORAL_CAPSULE | ORAL | Status: DC

## 2015-09-22 ENCOUNTER — Other Ambulatory Visit: Payer: Self-pay | Admitting: Family Medicine

## 2015-09-23 NOTE — Telephone Encounter (Signed)
Refill appropriate and filled per protocol. 

## 2015-10-03 ENCOUNTER — Telehealth: Payer: Self-pay | Admitting: Family Medicine

## 2015-10-03 ENCOUNTER — Ambulatory Visit (INDEPENDENT_AMBULATORY_CARE_PROVIDER_SITE_OTHER): Payer: 59 | Admitting: Family Medicine

## 2015-10-03 ENCOUNTER — Encounter: Payer: Self-pay | Admitting: Family Medicine

## 2015-10-03 VITALS — BP 128/78 | HR 80 | Temp 98.7°F | Resp 14 | Ht 66.0 in | Wt 175.0 lb

## 2015-10-03 DIAGNOSIS — R0981 Nasal congestion: Secondary | ICD-10-CM | POA: Diagnosis not present

## 2015-10-03 DIAGNOSIS — M722 Plantar fascial fibromatosis: Secondary | ICD-10-CM

## 2015-10-03 NOTE — Patient Instructions (Signed)
Use mucinex, flonase  Podiatry referral  F/U as previous
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
## 2015-10-08 ENCOUNTER — Other Ambulatory Visit: Payer: Self-pay | Admitting: Family Medicine

## 2015-10-10 NOTE — Telephone Encounter (Signed)
Refill appropriate and filled per protocol. 

## 2015-10-18 ENCOUNTER — Encounter: Payer: Self-pay | Admitting: Podiatry

## 2015-10-18 ENCOUNTER — Ambulatory Visit (INDEPENDENT_AMBULATORY_CARE_PROVIDER_SITE_OTHER): Payer: 59

## 2015-10-18 ENCOUNTER — Ambulatory Visit (INDEPENDENT_AMBULATORY_CARE_PROVIDER_SITE_OTHER): Payer: 59 | Admitting: Podiatry

## 2015-10-18 VITALS — BP 130/77 | HR 67 | Resp 16 | Ht 65.0 in | Wt 175.0 lb

## 2015-10-18 DIAGNOSIS — M722 Plantar fascial fibromatosis: Secondary | ICD-10-CM | POA: Diagnosis not present

## 2015-10-18 MED ORDER — METHYLPREDNISOLONE 4 MG PO TBPK: ORAL_TABLET | ORAL | Status: DC

## 2015-10-18 MED ORDER — MELOXICAM 15 MG PO TABS: 15.0000 mg | ORAL_TABLET | Freq: Every day | ORAL | Status: DC

## 2015-10-18 NOTE — Progress Notes (Signed)
   Subjective:    Patient ID: Mary Robles, female    DOB: [birth year], 39 y.o.   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 States that the left foot started first and has now spread into her ankle. Mornings are particularly bad with the first step out of bed the feet hurt all the time. She is really tried nothing other than the shoe gear changes and anti-inflammatories.    Review of Systems  HENT: Positive for sinus pressure.   Musculoskeletal: Positive for myalgias and arthralgias.  All other systems reviewed and are negative.      Objective:   Physical Exam: 39 year old black female in good humor and in no apparent distress presents vital signs stable alert and oriented 3. Pulses are strongly palpable bilateral. Neurologic sensorium is intact per Semmes-Weinstein monofilament. Deep tendon reflexes are intact bilateral and muscle strength +5 over 5 dorsiflexion plantar flexors and inverters and everters all intrinsic musculature is intact. She does have pain on inversion against resistance and eversion against resistance to the left foot and ankle. On palpation of the posterior tibial tendon and the peroneal tendons she does have fluctuance within these tendon sheaths. Orthopedic evaluation demonstrates all joints distal to the ankle have a full range of motion without crepitation. She has pain on palpation medial calcaneal tubercles left greater than right. Radiographs do confirm 3 views taken in the office today plantar fasciitis of the bilateral heels soft tissue increase in density at the plantar fascial calcaneal insertion site. Cutaneous evaluation demonstrates no open lesions wounds ecchymosis bleeding or drainage.          Assessment & Plan:  Bilateral plantar fasciitis with posterior tibial tendinitis and peroneal tendinitis left.  Plan: Discussed etiology pathology conservative versus  surgical therapies. Started her on a Medrol Dosepak to be followed by meloxicam. Injected the bilateral heels today with Kenalog and local anesthetic. Dispense plantar fascial braces bilaterally and a night splint left. We discussed appropriate shoe gear stretching exercises ice therapy and shoe gear modifications. I will follow up with her in 1 month.  Roselind Messier DPM

## 2015-10-18 NOTE — Patient Instructions (Signed)

## 2015-11-04 ENCOUNTER — Other Ambulatory Visit: Payer: Self-pay

## 2015-11-04 DIAGNOSIS — Z1231 Encounter for screening mammogram for malignant neoplasm of breast: Secondary | ICD-10-CM

## 2015-11-15 ENCOUNTER — Ambulatory Visit: Payer: Worker's Compensation | Admitting: Podiatry

## 2015-11-18 ENCOUNTER — Telehealth: Payer: Self-pay | Admitting: Family Medicine

## 2015-11-18 NOTE — Telephone Encounter (Signed)
Call placed to patient.   Reports that she has x1 day nonproductive cough, post nasal drip, nasal drainage with clear mucus, and sinus pressure.   Advised to use OTC Mucinex or Robitussin for cough and nasal saline for congestion also advised to use OTC Chloraseptic for throat. Advised to increase rest and to increase fluid intake. Recommended that if symptoms worsen or persist >1 week to contact office for visit.

## 2015-11-18 NOTE — Telephone Encounter (Signed)
Pt is calling to see if we call her in a Z-pack, she has been back and forth to the hospital caring for her husband who has pneumonia and has also been caring for a sick child this week who has an URI. She ha started to have a cough and runny nose.  [place]. Pt ph: [phone]

## 2015-11-26 ENCOUNTER — Emergency Department (INDEPENDENT_AMBULATORY_CARE_PROVIDER_SITE_OTHER)
Admission: EM | Admit: 2015-11-26 | Discharge: 2015-11-26 | Disposition: A | Discharge status: Home / Self Care | Payer: 59 | Source: Home / Self Care | Attending: Family Medicine | Admitting: Family Medicine

## 2015-11-26 ENCOUNTER — Encounter (HOSPITAL_COMMUNITY): Payer: Self-pay | Admitting: Emergency Medicine

## 2015-11-26 DIAGNOSIS — L03113 Cellulitis of right upper limb: Secondary | ICD-10-CM | POA: Diagnosis not present

## 2015-11-26 DIAGNOSIS — L923 Foreign body granuloma of the skin and subcutaneous tissue: Secondary | ICD-10-CM | POA: Diagnosis not present

## 2015-11-26 MED ORDER — PREDNISONE 10 MG PO TABS: 20.0000 mg | ORAL_TABLET | Freq: Every day | ORAL | Status: DC

## 2015-11-26 MED ORDER — CEPHALEXIN 500 MG PO CAPS: 500.0000 mg | ORAL_CAPSULE | Freq: Three times a day (TID) | ORAL | Status: DC

## 2015-11-26 NOTE — ED Notes (Signed)
C/o rash on right wrist onset 3 days that is spreading upward Denies fevers, chills A&O x4... No acute distress.

## 2015-11-26 NOTE — ED Provider Notes (Signed)
CSN: [id number]     Arrival date & time 11/26/15  1851 History   First MD Initiated Contact with Patient 11/26/15 1941     Chief Complaint  Patient presents with  . Rash   (Consider location/radiation/quality/duration/timing/severity/associated sxs/prior Treatment) Patient is a 39 y.o. female presenting with rash. The history is provided by the patient. No language interpreter was used.  Rash Location:  Hand Hand rash location:  R wrist Quality: blistering, burning, draining, itchiness and weeping   Quality: not painful, not red and not swelling   Severity:  Mild Onset quality:  Gradual Duration:  3 days Timing:  Constant Progression:  Spreading Context: chemical exposure   Context: not exposure to similar rash, not insect bite/sting, not medications, not new detergent/soap, not pollen, not pregnancy and not sick contacts   Context comment:  Recently got a tattoo about 1 month ago. This is not her first tattoo. Similar rash two weeks after she got a tatto. Relieved by:  Nothing Worsened by:  Nothing tried Ineffective treatments: Over the counter tripple A/B. Associated symptoms: no fever, no hoarse voice, no nausea, no sore throat, no throat swelling and not vomiting     Past Medical History  Diagnosis Date  . Herpes simplex   . Sickle cell trait ([place])   . Anxiety   . Hypertension   . Hyperlysinemia ([employer])   . Vitamin D deficiency   . Depression   . Asthma     no symptoms in "years", no inhaler   Past Surgical History  Procedure Laterality Date  . Tubal ligation    . Foot surgery    . Abdominal hysterectomy N/A 05/04/2014    Procedure: HYSTERECTOMY ABDOMINAL with BILATERAL SALPINGECTOMY ;  Surgeon: Everardo All Amundson de Berton Lan, MD;  Location: [facility];  Service: Gynecology;  Laterality: N/A;  . Lipoma excision N/A 05/04/2014    Procedure: EXCISION LIPOMA of abdominal wall;  Surgeon: Jamey Reas de Berton Lan, MD;  Location: [facility];  Service: Gynecology;   Laterality: N/A;  . Laparotomy N/A 05/04/2014    Procedure: REMOVAL OF ABD WALL MASS ;  Surgeon: Adin Hector, MD;  Location: [facility];  Service: General;  Laterality: N/A;   Family History  Problem Relation Age of Onset  . Diabetes Mother   . Thyroid disease Mother   . Mental retardation Maternal Aunt   . Diabetes Maternal Grandmother    Social History  Substance Use Topics  . Smoking status: Never Smoker   . Smokeless tobacco: Never Used  . Alcohol Use: No   OB History    Gravida Para Term Preterm AB TAB SAB Ectopic Multiple Living   4 2   1 1    2      Review of Systems  Constitutional: Negative for fever.  HENT: Negative for hoarse voice and sore throat.   Respiratory: Negative.   Cardiovascular: Negative.   Gastrointestinal: Negative for nausea and vomiting.  Skin: Positive for rash.  All other systems reviewed and are negative.   Allergies  Vicodin and Betadine  Home Medications   Prior to Admission medications   Medication Sig Start Date End Date Taking? Authorizing Provider  benazepril (LOTENSIN) 20 MG tablet TAKE ONE TABLET BY MOUTH ONCE DAILY 09/23/15  Yes Alycia Rossetti, MD  cloNIDine (CATAPRES) 0.1 MG tablet TAKE ONE TABLET BY MOUTH TWICE DAILY 10/10/15  Yes Alycia Rossetti, MD  escitalopram (LEXAPRO) 20 MG tablet Take 1 tablet (20 mg total)  by mouth at bedtime. 04/11/15  Yes Alycia Rossetti, MD  clonazePAM (KLONOPIN) 1 MG tablet Take 1 tablet (1 mg total) by mouth at bedtime as needed for anxiety. 02/08/15   Alycia Rossetti, MD  fluconazole (DIFLUCAN) 150 MG tablet Take 1 tablet (150 mg total) by mouth once. 07/12/15   Alycia Rossetti, MD  meloxicam (MOBIC) 15 MG tablet Take 1 tablet (15 mg total) by mouth daily. 10/18/15   Max T Hyatt, DPM  methylPREDNISolone (MEDROL) 4 MG TBPK tablet Tapering 6 day dose pack 10/18/15   Max T Hyatt, DPM  pravastatin (PRAVACHOL) 20 MG tablet Take 1 tablet (20 mg total) by mouth daily. 02/08/15   Alycia Rossetti, MD  Vitamin  D, Ergocalciferol, (DRISDOL) 50000 UNITS CAPS capsule Take (1) capsule by mouth every week x12 weeks, then stop. Begin over the counter Vitamin D 1000IU after 12 week course. 07/14/15   Alycia Rossetti, MD   Meds Ordered and Administered this Visit  Medications - No data to display  BP 126/67 mmHg  Pulse 71  Temp(Src) 99 F (37.2 C) (Oral)  Resp 18  SpO2 100%  LMP 04/17/2014 No data found.   Physical Exam  Constitutional: She appears well-developed. No distress.  Cardiovascular: Normal rate, regular rhythm and normal heart sounds.   No murmur heard. Pulmonary/Chest: Effort normal and breath sounds normal. No respiratory distress. She has no wheezes.  Skin:     Nursing note and vitals reviewed.   ED Course  Procedures (including critical care time)  Labs Review Labs Reviewed - No data to display  Imaging Review No results found.   Visual Acuity Review  Right Eye Distance:   Left Eye Distance:   Bilateral Distance:    Right Eye Near:   Left Eye Near:    Bilateral Near:         MDM  No diagnosis found. Tattoo reaction  Cellulitis of right upper extremity  May use OTC benadryl for itching. Oral steroid prescribed to calm inflammation process Keflex ( A/B coverage) given. Return precaution discussed.    Kinnie Feil, MD 11/26/15 [phone]

## 2015-11-26 NOTE — Discharge Instructions (Signed)
Cellulitis °Cellulitis is an infection of the skin and the tissue under the skin. The infected area is usually red and tender. This happens most often in the arms and lower legs. °HOME CARE  °· Take your antibiotic medicine as told. Finish the medicine even if you start to feel better. °· Keep the infected arm or leg raised (elevated). °· Put a warm cloth on the area up to 4 times per day. °· Only take medicines as told by your doctor. °· Keep all doctor visits as told. °GET HELP IF: °· You see red streaks on the skin coming from the infected area. °· Your red area gets bigger or turns a dark color. °· Your bone or joint under the infected area is painful after the skin heals. °· Your infection comes back in the same area or different area. °· You have a puffy (swollen) bump in the infected area. °· You have new symptoms. °· You have a fever. °GET HELP RIGHT AWAY IF:  °· You feel very sleepy. °· You throw up (vomit) or have watery poop (diarrhea). °· You feel sick and have muscle aches and pains. °  °This information is not intended to replace advice given to you by your health care provider. Make sure you discuss any questions you have with your health care provider. °  °Document Released: 05/21/2008 Document Revised: 08/24/2015 Document Reviewed: 02/18/2012 °Elsevier Interactive Patient Education ©2016 Elsevier Inc. ° °

## 2015-12-01 ENCOUNTER — Ambulatory Visit
Admission: RE | Admit: 2015-12-01 | Discharge: 2015-12-01 | Disposition: A | Discharge status: Home / Self Care | Payer: 59 | Source: Ambulatory Visit

## 2015-12-01 ENCOUNTER — Ambulatory Visit (INDEPENDENT_AMBULATORY_CARE_PROVIDER_SITE_OTHER): Payer: 59 | Admitting: Podiatry

## 2015-12-01 ENCOUNTER — Encounter: Payer: Self-pay | Admitting: Podiatry

## 2015-12-01 DIAGNOSIS — M722 Plantar fascial fibromatosis: Secondary | ICD-10-CM | POA: Diagnosis not present

## 2015-12-01 DIAGNOSIS — Z1231 Encounter for screening mammogram for malignant neoplasm of breast: Secondary | ICD-10-CM

## 2015-12-04 NOTE — Progress Notes (Signed)
She presents today for follow-up she states that the right foot is better than the left foot as far as plantar fasciitis. She continues all conservative therapies braces night splints and medication.  Objective: Pulses are palpable. She is pain on palpation left foot known the right. No open lesions or wounds. Muscle strength is normal.  Assessment: Resolving plantar fasciitis right. Residual plantar fasciitis left.  Plan: Reinjected the left heel today with Kenalog and local anesthetic to continue conservative therapies follow up with me in 1 month.

## 2015-12-09 ENCOUNTER — Other Ambulatory Visit: Payer: Self-pay | Admitting: Family Medicine

## 2015-12-09 NOTE — Telephone Encounter (Signed)
Medication refilled per protocol. 

## 2015-12-23 ENCOUNTER — Encounter (HOSPITAL_COMMUNITY): Payer: Self-pay | Admitting: Emergency Medicine

## 2015-12-23 ENCOUNTER — Emergency Department (HOSPITAL_COMMUNITY)
Admission: EM | Admit: 2015-12-23 | Discharge: 2015-12-23 | Disposition: A | Discharge status: Home / Self Care | Payer: 59 | Source: Home / Self Care | Attending: Family Medicine | Admitting: Family Medicine

## 2015-12-23 DIAGNOSIS — J069 Acute upper respiratory infection, unspecified: Secondary | ICD-10-CM

## 2015-12-23 MED ORDER — IPRATROPIUM BROMIDE 0.06 % NA SOLN: 2.0000 | Freq: Four times a day (QID) | NASAL | Status: DC

## 2015-12-23 NOTE — Discharge Instructions (Signed)
Drink plenty of fluids as discussed, use medicine as prescribed, and mucinex or delsym for cough. Return or see your doctor if further problems °

## 2015-12-23 NOTE — ED Provider Notes (Signed)
CSN: [id number]     Arrival date & time 12/23/15  1303 History   First MD Initiated Contact with Patient 12/23/15 1334     Chief Complaint  Patient presents with  . URI   (Consider location/radiation/quality/duration/timing/severity/associated sxs/prior Treatment) Patient is a 40 y.o. female presenting with URI. The history is provided by the patient.  URI Presenting symptoms: congestion, cough and rhinorrhea   Presenting symptoms: no fever   Severity:  Moderate Onset quality:  Gradual Duration:  4 days Progression:  Unchanged Chronicity:  New Relieved by:  None tried Worsened by:  Nothing tried Ineffective treatments:  OTC medications   Past Medical History  Diagnosis Date  . Herpes simplex   . Sickle cell trait ([place])   . Anxiety   . Hypertension   . Hyperlysinemia ([place])   . Vitamin D deficiency   . Depression   . Asthma     no symptoms in "years", no inhaler   Past Surgical History  Procedure Laterality Date  . Tubal ligation    . Foot surgery    . Abdominal hysterectomy N/A 05/04/2014    Procedure: HYSTERECTOMY ABDOMINAL with BILATERAL SALPINGECTOMY ;  Surgeon: Everardo All Amundson de Berton Lan, MD;  Location: [facility];  Service: Gynecology;  Laterality: N/A;  . Lipoma excision N/A 05/04/2014    Procedure: EXCISION LIPOMA of abdominal wall;  Surgeon: Jamey Reas de Berton Lan, MD;  Location: [facility];  Service: Gynecology;  Laterality: N/A;  . Laparotomy N/A 05/04/2014    Procedure: REMOVAL OF ABD WALL MASS ;  Surgeon: Adin Hector, MD;  Location: [facility];  Service: General;  Laterality: N/A;   Family History  Problem Relation Age of Onset  . Diabetes Mother   . Thyroid disease Mother   . Mental retardation Maternal Aunt   . Diabetes Maternal Grandmother    Social History  Substance Use Topics  . Smoking status: Never Smoker   . Smokeless tobacco: Never Used  . Alcohol Use: No   OB History    Gravida Para Term Preterm AB TAB SAB Ectopic Multiple  Living   4 2   1 1    2      Review of Systems  Constitutional: Negative.  Negative for fever.  HENT: Positive for congestion, postnasal drip and rhinorrhea.   Respiratory: Positive for cough.   All other systems reviewed and are negative.   Allergies  Vicodin and Betadine  Home Medications   Prior to Admission medications   Medication Sig Start Date End Date Taking? Authorizing Provider  Camphor-Eucalyptus-Menthol (VAPORIZING CHEST RUB EX) Apply topically.   Yes Historical Provider, MD  guaiFENesin (MUCINEX) 600 MG 12 hr tablet Take by mouth 2 (two) times daily.   Yes Historical Provider, MD  benazepril (LOTENSIN) 20 MG tablet TAKE ONE TABLET BY MOUTH ONCE DAILY 09/23/15   Alycia Rossetti, MD  cephALEXin (KEFLEX) 500 MG capsule Take 1 capsule (500 mg total) by mouth 3 (three) times daily. 11/26/15   Kinnie Feil, MD  clonazePAM (KLONOPIN) 1 MG tablet Take 1 tablet (1 mg total) by mouth at bedtime as needed for anxiety. 02/08/15   Alycia Rossetti, MD  cloNIDine (CATAPRES) 0.1 MG tablet TAKE ONE TABLET BY MOUTH TWICE DAILY 10/10/15   Alycia Rossetti, MD  escitalopram (LEXAPRO) 20 MG tablet TAKE ONE TABLET BY MOUTH AT BEDTIME 12/09/15   Alycia Rossetti, MD  fluconazole (DIFLUCAN) 150 MG tablet Take 1 tablet (150 mg total)  by mouth once. 07/12/15   Alycia Rossetti, MD  ipratropium (ATROVENT) 0.06 % nasal spray Place 2 sprays into both nostrils 4 (four) times daily. 12/23/15   Billy Fischer, MD  meloxicam (MOBIC) 15 MG tablet Take 1 tablet (15 mg total) by mouth daily. 10/18/15   Max T Hyatt, DPM  methylPREDNISolone (MEDROL) 4 MG TBPK tablet Tapering 6 day dose pack 10/18/15   Max T Hyatt, DPM  pravastatin (PRAVACHOL) 20 MG tablet Take 1 tablet (20 mg total) by mouth daily. 02/08/15   Alycia Rossetti, MD  predniSONE (DELTASONE) 10 MG tablet Take 2 tablets (20 mg total) by mouth daily. 11/26/15   Kinnie Feil, MD  Vitamin D, Ergocalciferol, (DRISDOL) 50000 UNITS CAPS capsule Take (1)  capsule by mouth every week x12 weeks, then stop. Begin over the counter Vitamin D 1000IU after 12 week course. 07/14/15   Alycia Rossetti, MD   Meds Ordered and Administered this Visit  Medications - No data to display  BP 143/91 mmHg  Pulse 71  Temp(Src) 98.4 F (36.9 C) (Oral)  Resp 16  SpO2 98%  LMP 04/17/2014 No data found.   Physical Exam  Constitutional: She is oriented to person, place, and time. She appears well-developed and well-nourished. No distress.  HENT:  Right Ear: External ear normal.  Left Ear: External ear normal.  Mouth/Throat: Oropharynx is clear and moist.  Neck: Normal range of motion. Neck supple.  Cardiovascular: Regular rhythm, normal heart sounds and intact distal pulses.   Pulmonary/Chest: Effort normal and breath sounds normal.  Abdominal: Soft. Bowel sounds are normal.  Neurological: She is alert and oriented to person, place, and time.  Skin: Skin is warm and dry.  Nursing note and vitals reviewed.   ED Course  Procedures (including critical care time)  Labs Review Labs Reviewed - No data to display  Imaging Review No results found.   Visual Acuity Review  Right Eye Distance:   Left Eye Distance:   Bilateral Distance:    Right Eye Near:   Left Eye Near:    Bilateral Near:         MDM   1. URI (upper respiratory infection)        Billy Fischer, MD 12/23/15 1401

## 2015-12-23 NOTE — ED Notes (Signed)
Uri symptoms, cough and cold.  Symptoms started January 3.

## 2015-12-29 ENCOUNTER — Ambulatory Visit: Payer: Self-pay | Admitting: Podiatry

## 2016-01-13 ENCOUNTER — Ambulatory Visit (INDEPENDENT_AMBULATORY_CARE_PROVIDER_SITE_OTHER): Payer: 59 | Admitting: Family Medicine

## 2016-01-13 ENCOUNTER — Encounter: Payer: Self-pay | Admitting: Family Medicine

## 2016-01-13 VITALS — BP 132/76 | HR 78 | Temp 98.9°F | Resp 14 | Ht 65.0 in | Wt 176.0 lb

## 2016-01-13 DIAGNOSIS — E785 Hyperlipidemia, unspecified: Secondary | ICD-10-CM | POA: Diagnosis not present

## 2016-01-13 DIAGNOSIS — I10 Essential (primary) hypertension: Secondary | ICD-10-CM

## 2016-01-13 DIAGNOSIS — J0100 Acute maxillary sinusitis, unspecified: Secondary | ICD-10-CM | POA: Diagnosis not present

## 2016-01-13 DIAGNOSIS — E559 Vitamin D deficiency, unspecified: Secondary | ICD-10-CM

## 2016-01-13 LAB — COMPREHENSIVE METABOLIC PANEL
ALBUMIN: 3.7 g/dL (ref 3.6–5.1)
ALK PHOS: 39 U/L (ref 33–115)
ALT: 14 U/L (ref 6–29)
AST: 16 U/L (ref 10–30)
BUN: 6 mg/dL — AB (ref 7–25)
CO2: 24 mmol/L (ref 20–31)
CREATININE: 0.82 mg/dL (ref 0.50–1.10)
Calcium: 8.8 mg/dL (ref 8.6–10.2)
Chloride: 105 mmol/L (ref 98–110)
Glucose, Bld: 81 mg/dL (ref 70–99)
POTASSIUM: 4.8 mmol/L (ref 3.5–5.3)
Sodium: 139 mmol/L (ref 135–146)
TOTAL PROTEIN: 6.5 g/dL (ref 6.1–8.1)
Total Bilirubin: 0.7 mg/dL (ref 0.2–1.2)

## 2016-01-13 LAB — CBC WITH DIFFERENTIAL/PLATELET
BASOS ABS: 0 10*3/uL (ref 0.0–0.1)
Basophils Relative: 0 % (ref 0–1)
EOS ABS: 0.1 10*3/uL (ref 0.0–0.7)
Eosinophils Relative: 2 % (ref 0–5)
HCT: 41.2 % (ref 36.0–46.0)
HEMOGLOBIN: 14 g/dL (ref 12.0–15.0)
LYMPHS ABS: 2.5 10*3/uL (ref 0.7–4.0)
LYMPHS PCT: 41 % (ref 12–46)
MCH: 28.7 pg (ref 26.0–34.0)
MCHC: 34 g/dL (ref 30.0–36.0)
MCV: 84.4 fL (ref 78.0–100.0)
MPV: 10.2 fL (ref 8.6–12.4)
Monocytes Absolute: 0.5 10*3/uL (ref 0.1–1.0)
Monocytes Relative: 8 % (ref 3–12)
NEUTROS ABS: 2.9 10*3/uL (ref 1.7–7.7)
NEUTROS PCT: 49 % (ref 43–77)
PLATELETS: 301 10*3/uL (ref 150–400)
RBC: 4.88 MIL/uL (ref 3.87–5.11)
RDW: 14.9 % (ref 11.5–15.5)
WBC: 6 10*3/uL (ref 4.0–10.5)

## 2016-01-13 LAB — LIPID PANEL
CHOL/HDL RATIO: 3.4 ratio (ref <=5.0)
CHOLESTEROL: 164 mg/dL (ref 125–200)
HDL: 48 mg/dL (ref >=46)
LDL Cholesterol: 100 mg/dL (ref <130)
Triglycerides: 79 mg/dL (ref <150)
VLDL: 16 mg/dL (ref <30)

## 2016-01-13 MED ORDER — AMOXICILLIN 875 MG PO TABS: 875.0000 mg | ORAL_TABLET | Freq: Two times a day (BID) | ORAL | Status: DC

## 2016-01-13 MED ORDER — CLONAZEPAM 1 MG PO TABS: 1.0000 mg | ORAL_TABLET | Freq: Every evening | ORAL | Status: DC | PRN

## 2016-01-13 MED ORDER — FLUCONAZOLE 150 MG PO TABS: 150.0000 mg | ORAL_TABLET | Freq: Once | ORAL | Status: DC

## 2016-01-13 NOTE — Progress Notes (Signed)
Patient ID: Mary Robles, female   DOB: October 17, 1976, 40 y.o.   MRN: [id number]    Subjective:    Patient ID: Mary Robles, female    DOB: Jan 30, 1976, 40 y.o.   MRN: [id number]  Patient presents for 6 month F/U and Illness Pt here to f/u medications, feels like sinus infection- drainage, pressure, HA, had URI approx 2 weeks ago, seen at [facility] , no meds given,. Has used her home nasal sprays and saline with  No improvement,. No significant cough unless she has drainage.  Taking BP meds as prescribed Seen by podiatry- has plantar fasciitis and tendinitis, has injection done in left foot, places in braces    Review Of Systems:  GEN- denies fatigue, fever, weight loss,weakness, recent illness HEENT- denies eye drainage, change in vision, +nasal discharge, CVS- denies chest pain, palpitations RESP- denies SOB, cough, wheeze ABD- denies N/V, change in stools, abd pain GU- denies dysuria, hematuria, dribbling, incontinence MSK- + joint pain, muscle aches, injury Neuro- denies headache, dizziness, syncope, seizure activity       Objective:    BP 132/76 mmHg  Pulse 78  Temp(Src) 98.9 F (37.2 C) (Oral)  Resp 14  Ht 5\' 5"  (1.651 m)  Wt 176 lb (79.833 kg)  BMI 29.29 kg/m2  LMP 04/17/2014 01/13/2016   GEN- NAD, alert and oriented x3 HEENT- PERRL, EOMI, non injected sclera, pink conjunctiva, MMM, oropharynx mild injection, TM clear bilat no effusion,  + maxillary sinus tenderness, inflammed turbinates,  Nasal drainage  Neck- Supple, no LAD CVS- RRR, no murmur RESP-CTAB EXT- No edema Pulses- Radial 2+         Assessment & Plan:      Problem List Items Addressed This Visit    Vitamin D deficiency   Relevant Orders   Vitamin D, 25-hydroxy   Hypertension - Primary    Controlled no change to meds Fasting labs today, including vitamin D Continue with diet an aerobic exercise once cleared by podiatry      Relevant Orders   CBC with Differential/Platelet   Comprehensive metabolic panel   Hyperlipidemia   Relevant Orders   Lipid panel    Other Visit Diagnoses    Acute maxillary sinusitis, recurrence not specified        Amox, nasal saline, diflucan due to yeast infections after antibiotics, atrovent spray    Relevant Medications    amoxicillin (AMOXIL) 875 MG tablet    fluconazole (DIFLUCAN) 150 MG tablet       Note: This dictation was prepared with Dragon dictation along with smaller phrase technology. Any transcriptional errors that result from this process are unintentional.

## 2016-01-13 NOTE — Addendum Note (Signed)
Addended by: Sheral Flow on: 01/13/2016 09:40 AM   Modules accepted: Orders

## 2016-01-13 NOTE — Assessment & Plan Note (Signed)
Controlled no change to meds Fasting labs today, including vitamin D Continue with diet an aerobic exercise once cleared by podiatry

## 2016-01-13 NOTE — Patient Instructions (Signed)
Take antibiotics as prescribed Continue to nasal saline F/U 6 months -

## 2016-01-15 LAB — VITAMIN D 25 HYDROXY (VIT D DEFICIENCY, FRACTURES): Vit D, 25-Hydroxy: 15 ng/mL — ABNORMAL LOW (ref 30–100)

## 2016-01-16 ENCOUNTER — Other Ambulatory Visit: Payer: Self-pay | Admitting: *Deleted

## 2016-01-16 MED ORDER — VITAMIN D (ERGOCALCIFEROL) 1.25 MG (50000 UNIT) PO CAPS: ORAL_CAPSULE | ORAL | Status: DC

## 2016-01-18 ENCOUNTER — Telehealth: Payer: Self-pay | Admitting: Family Medicine

## 2016-01-18 NOTE — Telephone Encounter (Signed)
Refill was called in on 01/13/16.  Pt made aware.  She will check with pharmacy

## 2016-01-18 NOTE — Telephone Encounter (Signed)
[facility] [address]   Needs refill on her klonopin

## 2016-01-27 ENCOUNTER — Ambulatory Visit (INDEPENDENT_AMBULATORY_CARE_PROVIDER_SITE_OTHER): Payer: 59 | Admitting: Obstetrics and Gynecology

## 2016-01-27 ENCOUNTER — Encounter: Payer: Self-pay | Admitting: Obstetrics and Gynecology

## 2016-01-27 VITALS — BP 122/84 | HR 68 | Resp 16 | Ht 66.0 in | Wt 176.6 lb

## 2016-01-27 DIAGNOSIS — Z01419 Encounter for gynecological examination (general) (routine) without abnormal findings: Secondary | ICD-10-CM | POA: Diagnosis not present

## 2016-01-27 NOTE — Patient Instructions (Signed)

## 2016-01-27 NOTE — Progress Notes (Signed)
Patient ID: Mary Robles, female   DOB: 18-Sep-1976, 40 y.o.   MRN: [id number] 40 y.o. [id number] Married [place] American female here for annual exam.    Status post right breast biopsy Jan.2016 - fibroadenoma.  Has low vit D.  PCP:  Vic Blackbird, MD  Patient's last menstrual period was 04/17/2014.          Sexually active: Yes.   female The current method of family planning is Tubal/status post hysterectomy.   Still has ovaries.  Exercising: No.   Smoker:  no  Health Maintenance: Pap:  01-21-14 Neg:Neg HR HPV History of abnormal Pap:  Yes, 2000 Hx dysplasia and cryotherapy to cervix. MMG:  12-01-15 Density Cat.B/neg/BiRads1:[facility]. Colonoscopy:  n/a BMD:   n/a  Result  n/a TDaP:  2013 Screening Labs:  Hb today: PCP, Urine today: unable to void   reports that she has never smoked. She has never used smokeless tobacco. She reports that she does not drink alcohol or use illicit drugs.  Past Medical History  Diagnosis Date  . Herpes simplex   . Sickle cell trait ([place])   . Anxiety   . Hypertension   . Hyperlysinemia ([place])   . Vitamin D deficiency   . Depression   . Asthma     no symptoms in "years", no inhaler    Past Surgical History  Procedure Laterality Date  . Tubal ligation    . Foot surgery    . Abdominal hysterectomy N/A 05/04/2014    Procedure: HYSTERECTOMY ABDOMINAL with BILATERAL SALPINGECTOMY ;  Surgeon: Everardo All Amundson de Berton Lan, MD;  Location: [facility];  Service: Gynecology;  Laterality: N/A;  . Lipoma excision N/A 05/04/2014    Procedure: EXCISION LIPOMA of abdominal wall;  Surgeon: Jamey Reas de Berton Lan, MD;  Location: [facility];  Service: Gynecology;  Laterality: N/A;  . Laparotomy N/A 05/04/2014    Procedure: REMOVAL OF ABD WALL MASS ;  Surgeon: Adin Hector, MD;  Location: [facility];  Service: General;  Laterality: N/A;  . Breast biopsy Right Jan. 2016    benign fibroadenoma    Current Outpatient Prescriptions  Medication  Sig Dispense Refill  . benazepril (LOTENSIN) 20 MG tablet TAKE ONE TABLET BY MOUTH ONCE DAILY 30 tablet 3  . Camphor-Eucalyptus-Menthol (VAPORIZING CHEST RUB EX) Apply topically.    . clonazePAM (KLONOPIN) 1 MG tablet Take 1 tablet (1 mg total) by mouth at bedtime as needed for anxiety. 30 tablet 1  . cloNIDine (CATAPRES) 0.1 MG tablet TAKE ONE TABLET BY MOUTH TWICE DAILY 60 tablet 3  . escitalopram (LEXAPRO) 20 MG tablet TAKE ONE TABLET BY MOUTH AT BEDTIME 30 tablet 1  . Vitamin D, Ergocalciferol, (DRISDOL) 50000 units CAPS capsule Take (1) capsule by mouth every week x6 weeks, then stop. 6 capsule 0   No current facility-administered medications for this visit.    Family History  Problem Relation Age of Onset  . Diabetes Mother   . Thyroid disease Mother   . Mental retardation Maternal Aunt   . Diabetes Maternal Grandmother     ROS:  Pertinent items are noted in HPI.  Otherwise, a comprehensive ROS was negative.  Exam:   BP 122/84 mmHg  Pulse 68  Resp 16  Ht 5\' 6"  (1.676 m)  Wt 176 lb 9.6 oz (80.105 kg)  BMI 28.52 kg/m2  LMP 04/17/2014    General appearance: alert, cooperative and appears stated age Head: Normocephalic, without obvious abnormality, atraumatic Neck:  no adenopathy, supple, symmetrical, trachea midline and thyroid normal to inspection and palpation Lungs: clear to auscultation bilaterally Breasts: normal appearance, no masses or tenderness, Inspection negative, No nipple retraction or dimpling, No nipple discharge or bleeding, No axillary or supraclavicular adenopathy Heart: regular rate and rhythm Abdomen: soft, non-tender; bowel sounds normal; no masses,  no organomegaly Extremities: extremities normal, atraumatic, no cyanosis or edema Skin: Skin color, texture, turgor normal. No rashes or lesions Lymph nodes: Cervical, supraclavicular, and axillary nodes normal. No abnormal inguinal nodes palpated Neurologic: Grossly normal  Pelvic: External genitalia:  no  lesions              Urethra:  normal appearing urethra with no masses, tenderness or lesions              Bartholins and Skenes: normal                 Vagina: normal appearing vagina with normal color and discharge, no lesions              Cervix: absent              Pap taken: No. Bimanual Exam:  Uterus:  uterus absent              Adnexa: normal adnexa and no mass, fullness, tenderness              Rectovaginal: Yes.  .  Confirms.              Anus:  normal sphincter tone, no lesions  Chaperone was present for exam.  Assessment:   Well woman visit with normal exam. Status post robotic TLH/bilateral salpingectomy.  Hx right breast fibroadenoma.   Plan: Yearly mammogram recommended after age 26.  Recommended self breast exam.  Pap and HR HPV as above. Discussed Calcium, Vitamin D, regular exercise program including cardiovascular and weight bearing exercise. Labs performed.  No..    Refills given on medications.  No..   Follow up annually and prn.     After visit summary provided.

## 2016-01-31 ENCOUNTER — Encounter: Payer: Self-pay | Admitting: Family Medicine

## 2016-02-01 ENCOUNTER — Other Ambulatory Visit: Payer: Self-pay | Admitting: Family Medicine

## 2016-02-01 NOTE — Telephone Encounter (Signed)
Refill appropriate and filled per protocol. 

## 2016-02-16 ENCOUNTER — Other Ambulatory Visit: Payer: Self-pay | Admitting: Family Medicine

## 2016-02-17 NOTE — Telephone Encounter (Signed)
Medication refilled per protocol. 

## 2016-04-30 ENCOUNTER — Ambulatory Visit (INDEPENDENT_AMBULATORY_CARE_PROVIDER_SITE_OTHER): Payer: 59 | Admitting: Obstetrics and Gynecology

## 2016-04-30 ENCOUNTER — Encounter: Payer: Self-pay | Admitting: Obstetrics and Gynecology

## 2016-04-30 VITALS — BP 130/90 | HR 84 | Resp 16 | Wt 176.0 lb

## 2016-04-30 DIAGNOSIS — N760 Acute vaginitis: Secondary | ICD-10-CM | POA: Diagnosis not present

## 2016-04-30 DIAGNOSIS — Z113 Encounter for screening for infections with a predominantly sexual mode of transmission: Secondary | ICD-10-CM | POA: Diagnosis not present

## 2016-04-30 NOTE — Progress Notes (Signed)
Patient ID: Mary Robles, female   DOB: [birth year], 40 y.o.   MRN: [id number] GYNECOLOGY  VISIT   HPI: 40 y.o.   Married  [place] American  female   [phone] with Patient's last menstrual period was 04/17/2014.   here c/o vaginal discharge.  Having vaginal discharge and odor.  No OTC tx.  A little bit of itching.  No burning.   New sexual partner.  No condom use.   GYNECOLOGIC HISTORY: Patient's last menstrual period was 04/17/2014. Contraception:  Hysterectomy  Menopausal hormone therapy:  none Last mammogram:  12-01-15 WNL BI RADS1 Last pap smear:   01-21-14 WNL NEG HR HPV         OB History    Gravida Para Term Preterm AB TAB SAB Ectopic Multiple Living   4 2   1 1    2          Patient Active Problem List   Diagnosis Date Noted  . Asthma, mild 04/11/2015  . GAD (generalized anxiety disorder) 02/08/2015  . Hyperlipidemia 07/09/2014  . Insomnia 07/09/2014  . Other malaise and fatigue 07/09/2014  . Nasal congestion 07/09/2014  . Urinary problem 07/09/2014  . Lipoma of abdominal wall s/p excision 05/04/2014 05/19/2014  . Status post total abdominal hysterectomy 05/04/2014  . Hypertension   . Vitamin D deficiency     Past Medical History  Diagnosis Date  . Herpes simplex   . Sickle cell trait ([place])   . Anxiety   . Hypertension   . Hyperlysinemia ([place])   . Vitamin D deficiency   . Depression   . Asthma     no symptoms in "years", no inhaler    Past Surgical History  Procedure Laterality Date  . Tubal ligation    . Foot surgery    . Abdominal hysterectomy N/A 05/04/2014    Procedure: HYSTERECTOMY ABDOMINAL with BILATERAL SALPINGECTOMY ;  Surgeon: Everardo All Amundson de Berton Lan, MD;  Location: [facility];  Service: Gynecology;  Laterality: N/A;  . Lipoma excision N/A 05/04/2014    Procedure: EXCISION LIPOMA of abdominal wall;  Surgeon: Jamey Reas de Berton Lan, MD;  Location: [facility];  Service: Gynecology;  Laterality: N/A;  . Laparotomy N/A  05/04/2014    Procedure: REMOVAL OF ABD WALL MASS ;  Surgeon: Adin Hector, MD;  Location: [facility];  Service: General;  Laterality: N/A;  . Breast biopsy Right Jan. 2016    benign fibroadenoma    Current Outpatient Prescriptions  Medication Sig Dispense Refill  . benazepril (LOTENSIN) 20 MG tablet TAKE ONE TABLET BY MOUTH ONCE DAILY 90 tablet 1  . cloNIDine (CATAPRES) 0.1 MG tablet TAKE ONE TABLET BY MOUTH TWICE DAILY 180 tablet 1  . escitalopram (LEXAPRO) 20 MG tablet TAKE ONE TABLET BY MOUTH AT BEDTIME 90 tablet 1  . Vitamin D, Ergocalciferol, (DRISDOL) 50000 units CAPS capsule Take (1) capsule by mouth every week x6 weeks, then stop. 6 capsule 0   No current facility-administered medications for this visit.     ALLERGIES: Vicodin and Betadine  Family History  Problem Relation Age of Onset  . Diabetes Mother   . Thyroid disease Mother   . Mental retardation Maternal Aunt   . Diabetes Maternal Grandmother     Social History   Social History  . Marital Status: Married    Spouse Name: N/A  . Number of Children: N/A  . Years of Education: N/A   Occupational History  . Not on file.  Social History Main Topics  . Smoking status: Never Smoker   . Smokeless tobacco: Never Used  . Alcohol Use: No  . Drug Use: No  . Sexual Activity:    Partners: Male    Birth Control/ Protection: Surgical     Comment: BTSP 2005/TAH/BSO   Other Topics Concern  . Not on file   Social History Narrative    ROS:  Pertinent items are noted in HPI.  PHYSICAL EXAMINATION:    BP 130/90 mmHg  Pulse 84  Resp 16  Wt 176 lb (79.833 kg)  LMP 04/17/2014    General appearance: alert, cooperative and appears stated age    Pelvic: External genitalia:  no lesions              Urethra:  normal appearing urethra with no masses, tenderness or lesions              Bartholins and Skenes: normal                 Vagina: normal appearing vagina with normal color and discharge, no lesions               Cervix: absent              Bimanual Exam:  Uterus:  uterus absent              Adnexa: normal adnexa and no mass, fullness, tenderness              Rectal exam: No..    Chaperone was present for exam.  ASSESSMENT  Vaginitis.  Desire for STD testing.  Status post TAH/bilateral salpingectomy.   PLAN  Discussion of vaginitis.  I am anticipating treatment for BV.   Discussed Metrogel vs. Flagyl.  Patient prefers Flagyl.  Affirm, GC/CT, HIV, RPR, hep B, and hep C testing.    An After Visit Summary was printed and given to the patient.  ___15___ minutes face to face time of which over 50% was spent in counseling.

## 2016-05-01 ENCOUNTER — Other Ambulatory Visit: Payer: Self-pay | Admitting: Obstetrics and Gynecology

## 2016-05-01 LAB — GC/CHLAMYDIA PROBE AMP
CT PROBE, AMP APTIMA: NOT DETECTED
GC Probe RNA: NOT DETECTED

## 2016-05-01 LAB — STD PANEL
HIV 1&2 Ab, 4th Generation: NONREACTIVE
Hepatitis B Surface Ag: NEGATIVE

## 2016-05-01 LAB — HEPATITIS C ANTIBODY: HCV Ab: NEGATIVE

## 2016-05-01 LAB — WET PREP BY MOLECULAR PROBE
CANDIDA SPECIES: POSITIVE — AB
GARDNERELLA VAGINALIS: POSITIVE — AB
TRICHOMONAS VAG: NEGATIVE

## 2016-05-01 MED ORDER — FLUCONAZOLE 150 MG PO TABS: 150.0000 mg | ORAL_TABLET | Freq: Once | ORAL | Status: DC

## 2016-05-01 MED ORDER — METRONIDAZOLE 500 MG PO TABS: 500.0000 mg | ORAL_TABLET | Freq: Two times a day (BID) | ORAL | Status: DC

## 2016-05-02 ENCOUNTER — Encounter: Payer: Self-pay | Admitting: Family Medicine

## 2016-06-02 ENCOUNTER — Encounter (HOSPITAL_COMMUNITY): Payer: Self-pay | Admitting: *Deleted

## 2016-06-02 ENCOUNTER — Emergency Department (HOSPITAL_COMMUNITY)
Admission: EM | Admit: 2016-06-02 | Discharge: 2016-06-02 | Disposition: A | Discharge status: Home / Self Care | Payer: 59 | Attending: Emergency Medicine | Admitting: Emergency Medicine

## 2016-06-02 DIAGNOSIS — R319 Hematuria, unspecified: Secondary | ICD-10-CM | POA: Diagnosis present

## 2016-06-02 DIAGNOSIS — I10 Essential (primary) hypertension: Secondary | ICD-10-CM | POA: Diagnosis not present

## 2016-06-02 DIAGNOSIS — Z79899 Other long term (current) drug therapy: Secondary | ICD-10-CM | POA: Insufficient documentation

## 2016-06-02 DIAGNOSIS — F329 Major depressive disorder, single episode, unspecified: Secondary | ICD-10-CM | POA: Insufficient documentation

## 2016-06-02 DIAGNOSIS — J0111 Acute recurrent frontal sinusitis: Secondary | ICD-10-CM | POA: Diagnosis not present

## 2016-06-02 DIAGNOSIS — N390 Urinary tract infection, site not specified: Secondary | ICD-10-CM

## 2016-06-02 DIAGNOSIS — J45909 Unspecified asthma, uncomplicated: Secondary | ICD-10-CM | POA: Insufficient documentation

## 2016-06-02 LAB — URINALYSIS, ROUTINE W REFLEX MICROSCOPIC
GLUCOSE, UA: NEGATIVE mg/dL
KETONES UR: NEGATIVE mg/dL
Nitrite: POSITIVE — AB
PROTEIN: 100 mg/dL — AB
Specific Gravity, Urine: 1.02 (ref 1.005–1.030)
pH: 6 (ref 5.0–8.0)

## 2016-06-02 LAB — URINE MICROSCOPIC-ADD ON

## 2016-06-02 MED ORDER — PREDNISONE 20 MG PO TABS: 40.0000 mg | ORAL_TABLET | Freq: Every day | ORAL | Status: DC

## 2016-06-02 MED ORDER — NITROFURANTOIN MONOHYD MACRO 100 MG PO CAPS: 100.0000 mg | ORAL_CAPSULE | Freq: Two times a day (BID) | ORAL | Status: DC

## 2016-06-02 NOTE — ED Notes (Signed)
Patient c/o low abdominal cramping and blood in urine since this morning.  Patient denies fever, N/V.

## 2016-06-02 NOTE — ED Notes (Signed)
Called back to triage x1 with no response

## 2016-06-02 NOTE — ED Provider Notes (Addendum)
CSN: [id number]     Arrival date & time 06/02/16  1253 History   First MD Initiated Contact with Patient 06/02/16 1340     Chief Complaint  Patient presents with  . Hematuria  . Nasal Congestion     (Consider location/radiation/quality/duration/timing/severity/associated sxs/prior Treatment) Patient is a 40 y.o. female presenting with hematuria. The history is provided by the patient.  Hematuria This is a new problem. Episode onset: today. The problem occurs constantly. The problem has not changed since onset.Associated symptoms comments: Suprapubic pain, no cva tenderness.  No fever, vomiting or diarrhea.  Today dysuria and frequency today. Exacerbated by: urinating. Nothing relieves the symptoms. She has tried nothing for the symptoms. The treatment provided no relief.    Past Medical History  Diagnosis Date  . Herpes simplex   . Sickle cell trait ([place])   . Anxiety   . Hypertension   . Hyperlysinemia ([place])   . Vitamin D deficiency   . Depression   . Asthma     no symptoms in "years", no inhaler   Past Surgical History  Procedure Laterality Date  . Tubal ligation    . Foot surgery    . Abdominal hysterectomy N/A 05/04/2014    Procedure: HYSTERECTOMY ABDOMINAL with BILATERAL SALPINGECTOMY ;  Surgeon: Everardo All Amundson de Berton Lan, MD;  Location: [facility];  Service: Gynecology;  Laterality: N/A;  . Lipoma excision N/A 05/04/2014    Procedure: EXCISION LIPOMA of abdominal wall;  Surgeon: Jamey Reas de Berton Lan, MD;  Location: [facility];  Service: Gynecology;  Laterality: N/A;  . Laparotomy N/A 05/04/2014    Procedure: REMOVAL OF ABD WALL MASS ;  Surgeon: Adin Hector, MD;  Location: [facility];  Service: General;  Laterality: N/A;  . Breast biopsy Right Jan. 2016    benign fibroadenoma   Family History  Problem Relation Age of Onset  . Diabetes Mother   . Thyroid disease Mother   . Mental retardation Maternal Aunt   . Diabetes Maternal Grandmother    Social  History  Substance Use Topics  . Smoking status: Never Smoker   . Smokeless tobacco: Never Used  . Alcohol Use: No   OB History    Gravida Para Term Preterm AB TAB SAB Ectopic Multiple Living   4 2   1 1    2      Review of Systems  HENT: Positive for congestion and sinus pressure.        2 days  Genitourinary: Positive for hematuria.  All other systems reviewed and are negative.     Allergies  Vicodin and Betadine  Home Medications   Prior to Admission medications   Medication Sig Start Date End Date Taking? Authorizing Provider  benazepril (LOTENSIN) 20 MG tablet TAKE ONE TABLET BY MOUTH ONCE DAILY 02/17/16   Alycia Rossetti, MD  cloNIDine (CATAPRES) 0.1 MG tablet TAKE ONE TABLET BY MOUTH TWICE DAILY 02/17/16   Alycia Rossetti, MD  escitalopram (LEXAPRO) 20 MG tablet TAKE ONE TABLET BY MOUTH AT BEDTIME 02/17/16   Alycia Rossetti, MD  fluconazole (DIFLUCAN) 150 MG tablet Take 1 tablet (150 mg total) by mouth once. Ok to repeat one tablet in 72 hours if needed. 05/01/16   [place], MD  metroNIDAZOLE (FLAGYL) 500 MG tablet Take 1 tablet (500 mg total) by mouth 2 (two) times daily. 05/01/16   Brook Oletta Lamas, MD  Vitamin D, Ergocalciferol, (DRISDOL) 50000 units  CAPS capsule Take (1) capsule by mouth every week x6 weeks, then stop. 01/16/16   Alycia Rossetti, MD   BP 179/99 mmHg  Pulse 71  Temp(Src) 98.2 F (36.8 C) (Oral)  Resp 18  SpO2 100%  LMP 04/17/2014 Physical Exam  Constitutional: She is oriented to person, place, and time. She appears well-developed and well-nourished. No distress.  HENT:  Head: Normocephalic and atraumatic.  Right Ear: Tympanic membrane normal.  Left Ear: Tympanic membrane normal.  Nose: Mucosal edema and rhinorrhea present.  Mouth/Throat: Oropharynx is clear and moist. No oropharyngeal exudate, posterior oropharyngeal edema or posterior oropharyngeal erythema.  Eyes: Conjunctivae and EOM are normal. Pupils are equal, round,  and reactive to light.  Neck: Normal range of motion. Neck supple.  Cardiovascular: Normal rate, regular rhythm and intact distal pulses.   No murmur heard. Pulmonary/Chest: Effort normal and breath sounds normal. No respiratory distress. She has no wheezes. She has no rales.  Abdominal: Soft. She exhibits no distension. There is no tenderness. There is no rebound and no guarding.  Genitourinary:  No blood in vagina  Musculoskeletal: Normal range of motion. She exhibits no edema or tenderness.  Neurological: She is alert and oriented to person, place, and time.  Skin: Skin is warm and dry. No rash noted. No erythema.  Psychiatric: She has a normal mood and affect. Her behavior is normal.  Nursing note and vitals reviewed.   ED Course  Procedures (including critical care time) Labs Review Labs Reviewed  URINALYSIS, ROUTINE W REFLEX MICROSCOPIC (NOT AT [facility]) - Abnormal; Notable for the following:    Color, Urine RED (*)    APPearance TURBID (*)    Hgb urine dipstick LARGE (*)    Bilirubin Urine SMALL (*)    Protein, ur 100 (*)    Nitrite POSITIVE (*)    Leukocytes, UA LARGE (*)    All other components within normal limits  URINE MICROSCOPIC-ADD ON - Abnormal; Notable for the following:    Squamous Epithelial / LPF 0-5 (*)    Bacteria, UA RARE (*)    All other components within normal limits    Imaging Review No results found. I have personally reviewed and evaluated these images and lab results as part of my medical decision-making.   EKG Interpretation None      MDM   Final diagnoses:  UTI (lower urinary tract infection)  Acute recurrent frontal sinusitis   Patient is a 40 year old female presenting with hematuria today. Initially she was concerned she may have vaginal bleeding however patient has had a hysterectomy and on visual exam she has no blood in her vaginal vault. This is most likely urethral blood. She is having suprapubic discomfort but no CVA pain. No prior  history of kidney stones.  Patient is in no acute distress at this time. Concern for potential developing infection versus spontaneous hematuria. She has had some microscopic hematuria in the past that she got a cystoscopies for N/A felt it was related to her blood pressure medication. She does not smoke or have other risk factors for bladder cancer. UA pending.  Secondly patient is here for nasal congestion that's been ongoing for the last 2 days. She has sinus pressure associated with this. She does take Flonase, Claritin for allergies but states that is not helping. She has been around multiple children may of been ill. Symptoms could be early URI versus allergic. This time she does not warrant antibiotics but will cover with prednisone.   2:47  PM UA consistent with UTI and treated with macrobid.  Blanchie Dessert, MD 06/02/16 [place], MD 06/02/16 1451

## 2016-06-02 NOTE — ED Notes (Addendum)
Pt states she noticed blood coming out of her vagina or urethra and pain while urinating this morning. Pt also complains of nasal congestion, states she was seen for sinus infection last month but that her symptoms have not improved. Pt had hysterectomy 04/2014

## 2016-06-08 ENCOUNTER — Encounter: Payer: Self-pay | Admitting: Family Medicine

## 2016-07-16 ENCOUNTER — Encounter: Payer: Self-pay | Admitting: Family Medicine

## 2016-07-16 ENCOUNTER — Ambulatory Visit (INDEPENDENT_AMBULATORY_CARE_PROVIDER_SITE_OTHER): Payer: 59 | Admitting: Family Medicine

## 2016-07-16 VITALS — BP 128/64 | HR 82 | Temp 98.1°F | Resp 14 | Ht 66.0 in | Wt 182.0 lb

## 2016-07-16 DIAGNOSIS — I10 Essential (primary) hypertension: Secondary | ICD-10-CM | POA: Diagnosis not present

## 2016-07-16 DIAGNOSIS — E559 Vitamin D deficiency, unspecified: Secondary | ICD-10-CM

## 2016-07-16 DIAGNOSIS — F411 Generalized anxiety disorder: Secondary | ICD-10-CM | POA: Diagnosis not present

## 2016-07-16 DIAGNOSIS — F322 Major depressive disorder, single episode, severe without psychotic features: Secondary | ICD-10-CM

## 2016-07-16 DIAGNOSIS — F329 Major depressive disorder, single episode, unspecified: Secondary | ICD-10-CM | POA: Insufficient documentation

## 2016-07-16 LAB — CBC WITH DIFFERENTIAL/PLATELET
BASOS ABS: 0 {cells}/uL (ref 0–200)
Basophils Relative: 0 %
Eosinophils Absolute: 160 cells/uL (ref 15–500)
Eosinophils Relative: 2 %
HEMATOCRIT: 42.5 % (ref 35.0–45.0)
HEMOGLOBIN: 14.3 g/dL (ref 12.0–15.0)
LYMPHS ABS: 2720 {cells}/uL (ref 850–3900)
Lymphocytes Relative: 34 %
MCH: 28.5 pg (ref 27.0–33.0)
MCHC: 33.6 g/dL (ref 32.0–36.0)
MCV: 84.7 fL (ref 80.0–100.0)
MONO ABS: 720 {cells}/uL (ref 200–950)
MPV: 10.2 fL (ref 7.5–12.5)
Monocytes Relative: 9 %
NEUTROS PCT: 55 %
Neutro Abs: 4400 cells/uL (ref 1500–7800)
Platelets: 331 10*3/uL (ref 140–400)
RBC: 5.02 MIL/uL (ref 3.80–5.10)
RDW: 14.8 % (ref 11.0–15.0)
WBC: 8 10*3/uL (ref 3.8–10.8)

## 2016-07-16 LAB — COMPREHENSIVE METABOLIC PANEL
ALBUMIN: 3.9 g/dL (ref 3.6–5.1)
ALK PHOS: 43 U/L (ref 33–115)
ALT: 13 U/L (ref 6–29)
AST: 16 U/L (ref 10–30)
BILIRUBIN TOTAL: 0.5 mg/dL (ref 0.2–1.2)
BUN: 7 mg/dL (ref 7–25)
CALCIUM: 8.7 mg/dL (ref 8.6–10.2)
CO2: 25 mmol/L (ref 20–31)
Chloride: 103 mmol/L (ref 98–110)
Creat: 0.8 mg/dL (ref 0.50–1.10)
Glucose, Bld: 81 mg/dL (ref 70–99)
Potassium: 4.4 mmol/L (ref 3.5–5.3)
Sodium: 137 mmol/L (ref 135–146)
TOTAL PROTEIN: 6.4 g/dL (ref 6.1–8.1)

## 2016-07-16 MED ORDER — LEVOCETIRIZINE DIHYDROCHLORIDE 5 MG PO TABS: 5.0000 mg | ORAL_TABLET | Freq: Every evening | ORAL | 3 refills | Status: DC

## 2016-07-16 NOTE — Assessment & Plan Note (Addendum)
Major depression the setting of marital issues and stressors at work. She is working very hard to stay on plan with her job essentially after separation from her husband she will need her own finances.  She is also sending her youngest child off to college which is another life change. I will try her on Trintillex as there is research that shows this helps with focus as well as the depression. As this is a similar class II her Lexapro but a decreased her Lexapro to 10 mg for 3 days and then have her stop and start the current telemetry she will taper from 5 mg up to 10 mg after 2 weeks. We will follow her up in the office around that time.  She willmake appt with her therapist at work, continue with exercise. Discussed martial counseling, they are not interested

## 2016-07-16 NOTE — Progress Notes (Signed)
Subjective:    Patient ID: Mary Robles, female    DOB: 02-21-1976, 40 y.o.   MRN: [id number]  Patient presents for 6 month F/U (is fasting) Patient here for 6 month follow-up. Her medications were reviewed. She is taken off her medicines as prescribed She was recently seen by her GYN and treated for bacterial vaginosis she is completely if his antibiotics. She was then in the emergency room a month ago secondary to UTI was treated with Macrobid  She is taking her medication for hypertension her blood pressure has been controlled she is also on Lexapro for anxiety symptoms.However she and her husband have decided to split. He is not been faithful. Her son is also leaving to go to college this is her youngest and there is no stress at work. She states that she is on some type of probationary plan at work because she had been losing focus with her depression and anxiety. She does not think that the Lexapro is helping. In the past she was on Cymbalta but she did not like the side effects of this. She would like to try something different. She is walking for exercise and plans to start with a therapist at her job. She does admit to sleeping more than usual  History of prolonged vitamin D deficiency we treated with 6 months of vitamin D daily weekly she is due for recheck  Review Of Systems:  GEN- denies fatigue, fever, weight loss,weakness, recent illness HEENT- denies eye drainage, change in vision, nasal discharge, CVS- denies chest pain, palpitations RESP- denies SOB, cough, wheeze ABD- denies N/V, change in stools, abd pain GU- denies dysuria, hematuria, dribbling, incontinence MSK- denies joint pain, muscle aches, injury Neuro- denies headache, dizziness, syncope, seizure activity       Objective:    BP 128/64 (BP Location: Left Arm, Patient Position: Sitting, Cuff Size: Normal)   Pulse 82   Temp 98.1 F (36.7 C) (Oral)   Resp 14   Ht 5\' 6"  (1.676 m)   Wt 182 lb (82.6 kg)    LMP 04/17/2014   BMI 29.38 kg/m  GEN- NAD, alert and oriented x3 HEENT- PERRL, EOMI, non injected sclera, pink conjunctiva, MMM, oropharynx clear CVS- RRR, no murmur RESP-CTAB Psych- depressed affect, not anxious appearing,no SI,well groomed  EXT- No edema Pulses- Radial,  2+        Assessment & Plan:      Problem List Items Addressed This Visit    Vitamin D deficiency - Primary   Relevant Orders   Vitamin D, 25-hydroxy   MDD (major depressive disorder) (H[facility])    Major depression the setting of marital issues and stressors at work. She is working very hard to stay on plan with her job essentially after separation from her husband she will need her own finances.  She is also sending her youngest child off to college which is another life change. I will try her on Trintillex as there is research that shows this helps with focus as well as the depression. As this is a similar class II her Lexapro but a decreased her Lexapro to 10 mg for 3 days and then have her stop and start the current telemetry she will taper from 5 mg up to 10 mg after 2 weeks. We will follow her up in the office around that time.  She willmake appt with her therapist at work, continue with exercise. Discussed martial counseling, they are not interested      Hypertension  Well controlled no change to meds      Relevant Orders   CBC with Differential/Platelet   Comprehensive metabolic panel   GAD (generalized anxiety disorder)    Other Visit Diagnoses   None.     Note: This dictation was prepared with Dragon dictation along with smaller phrase technology. Any transcriptional errors that result from this process are unintentional.

## 2016-07-16 NOTE — Patient Instructions (Signed)
Decrease lexapro - take 1/2 tablet for 3 days, then stop and start Trintillex 5mg  once a day, for 2 weeks Then go to 10mg  once a day  F/U 3 weeks for recheck

## 2016-07-16 NOTE — Assessment & Plan Note (Signed)
Well controlled no change to meds 

## 2016-07-17 LAB — VITAMIN D 25 HYDROXY (VIT D DEFICIENCY, FRACTURES): VIT D 25 HYDROXY: 20 ng/mL — AB (ref 30–100)

## 2016-08-06 ENCOUNTER — Ambulatory Visit (INDEPENDENT_AMBULATORY_CARE_PROVIDER_SITE_OTHER): Payer: 59 | Admitting: Family Medicine

## 2016-08-06 ENCOUNTER — Encounter: Payer: Self-pay | Admitting: Family Medicine

## 2016-08-06 VITALS — BP 126/70 | HR 84 | Temp 98.0°F | Resp 14 | Ht 66.0 in | Wt 185.0 lb

## 2016-08-06 DIAGNOSIS — F411 Generalized anxiety disorder: Secondary | ICD-10-CM | POA: Diagnosis not present

## 2016-08-06 DIAGNOSIS — F322 Major depressive disorder, single episode, severe without psychotic features: Secondary | ICD-10-CM

## 2016-08-06 MED ORDER — VORTIOXETINE HBR 10 MG PO TABS: 1.0000 | ORAL_TABLET | Freq: Every day | ORAL | 6 refills | Status: DC

## 2016-08-06 NOTE — Assessment & Plan Note (Signed)
Continue with Trintillex 10mg , she has been on this dose for 5 days now. Hopefully as she adjust to being off the lexapro,we will see good benefits with mood and concentration with this medication She will call therapist today  F/U in office in 2 months

## 2016-08-06 NOTE — Patient Instructions (Signed)
Get the Trintillex Call the therapist F/U 2 months

## 2016-08-06 NOTE — Progress Notes (Signed)
   Subjective:    Patient ID: Mary Robles, female    DOB: 23-Nov-1976, 40 y.o.   MRN: [id number]  Patient presents for Follow-up (3 weeks- is fasting) Patient here for interim follow-up on depression and marital issues. Her last visit she did not feel like her Lexapro was working a taper off Lexapro and started her on Trintillex 5mg  for 2 weeks then 10mg  daily as she was having difficulty with depression as well as concentration issues. She also has some underlying anxiety. She has not noticed a lot of change, but getting things done at work, not tearful does not feel anxious. She did have a few days were her legs were jumping at night but did not affect her sleep She was to schedule with a therapist at work has not done this She is still living with her husband , they are now empty nesters as she took her son to college at [employer] last week    Review Of Systems:  GEN- denies fatigue, fever, weight loss,weakness, recent illness HEENT- denies eye drainage, change in vision, nasal discharge, CVS- denies chest pain, palpitations RESP- denies SOB, cough, wheeze Neuro- denies headache, dizziness, syncope, seizure activity       Objective:    BP 126/70 (BP Location: Left Arm, Patient Position: Sitting, Cuff Size: Normal)   Pulse 84   Temp 98 F (36.7 C) (Oral)   Resp 14   Ht 5\' 6"  (1.676 m)   Wt 185 lb (83.9 kg)   LMP 04/17/2014   BMI 29.86 kg/m  GEN- NAD, alert and oriented x3 Psych- normal affect and mood, no SI, well groomed, good eye contact,  Neuro- no abnormal tics or movements         Assessment & Plan:      Problem List Items Addressed This Visit    MDD (major depressive disorder) ([place])    Continue with Trintillex 10mg , she has been on this dose for 5 days now. Hopefully as she adjust to being off the lexapro,we will see good benefits with mood and concentration with this medication She will call therapist today  F/U in office in 2 months       Relevant  Medications   vortioxetine HBr (TRINTELLIX) 10 MG TABS   GAD (generalized anxiety disorder) - Primary    Other Visit Diagnoses   None.     Note: This dictation was prepared with Dragon dictation along with smaller phrase technology. Any transcriptional errors that result from this process are unintentional.

## 2016-08-21 ENCOUNTER — Encounter: Payer: Self-pay | Admitting: Podiatry

## 2016-08-21 ENCOUNTER — Ambulatory Visit (INDEPENDENT_AMBULATORY_CARE_PROVIDER_SITE_OTHER): Payer: 59 | Admitting: Podiatry

## 2016-08-21 DIAGNOSIS — M722 Plantar fascial fibromatosis: Secondary | ICD-10-CM | POA: Diagnosis not present

## 2016-08-21 MED ORDER — MELOXICAM 15 MG PO TABS: 15.0000 mg | ORAL_TABLET | Freq: Every day | ORAL | 3 refills | Status: DC

## 2016-08-22 NOTE — Progress Notes (Signed)
She presents today after having not seen her since December 2016. She states that her plantar fasciitis was stable and doing very well up until approximately 1 month ago. She states that she ran out of medication several months ago and that her braces have worn out. She also is concerned about shoes.  Objective: Vital signs are stable alert and oriented 3. Pulses are strongly palpable. Neurologic sensorium is intact. Deep tendon reflexes are intact. Pain on palpation medial calcaneal tubercles left greater than right.  Assessment: Chronic intractable plantar fasciitis bilateral with recurrence.  Plan: Reinjected the bilateral heels today. Dispensed plantar fascia braces bilaterally. She will restart her meloxicam. I will follow-up with her in a few months if necessary. We may need to discuss orthotics at that time.

## 2016-09-10 ENCOUNTER — Other Ambulatory Visit: Payer: Self-pay | Admitting: Family Medicine

## 2016-09-17 ENCOUNTER — Other Ambulatory Visit: Payer: Self-pay | Admitting: Family Medicine

## 2016-09-20 ENCOUNTER — Encounter (INDEPENDENT_AMBULATORY_CARE_PROVIDER_SITE_OTHER): Payer: Self-pay | Admitting: Podiatry

## 2016-09-20 NOTE — Progress Notes (Signed)
This encounter was created in error - please disregard.

## 2016-09-28 ENCOUNTER — Encounter: Payer: Self-pay | Admitting: Family Medicine

## 2016-09-28 NOTE — Telephone Encounter (Signed)
Call placed to patient.   No answer.   LM on VM to return call.

## 2016-10-08 ENCOUNTER — Encounter: Payer: Self-pay | Admitting: Family Medicine

## 2016-10-08 ENCOUNTER — Telehealth: Payer: Self-pay | Admitting: *Deleted

## 2016-10-08 ENCOUNTER — Ambulatory Visit (INDEPENDENT_AMBULATORY_CARE_PROVIDER_SITE_OTHER): Payer: 59 | Admitting: Family Medicine

## 2016-10-08 VITALS — BP 136/70 | HR 84 | Temp 98.1°F | Resp 12 | Ht 66.0 in | Wt 188.0 lb

## 2016-10-08 DIAGNOSIS — J069 Acute upper respiratory infection, unspecified: Secondary | ICD-10-CM | POA: Diagnosis not present

## 2016-10-08 DIAGNOSIS — F322 Major depressive disorder, single episode, severe without psychotic features: Secondary | ICD-10-CM

## 2016-10-08 DIAGNOSIS — I10 Essential (primary) hypertension: Secondary | ICD-10-CM

## 2016-10-08 MED ORDER — AZITHROMYCIN 250 MG PO TABS: ORAL_TABLET | ORAL | 0 refills | Status: DC

## 2016-10-08 NOTE — Telephone Encounter (Signed)
Received fax from Rogelia Boga, [place] [phone]~telephone/ [phone]~fax for FMLA forms.   Call placed to patient for more information.   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   Forms routed to provider.

## 2016-10-08 NOTE — Assessment & Plan Note (Signed)
Well controlled, continue clonidine

## 2016-10-08 NOTE — Patient Instructions (Signed)
Fax me FMLA form COntinue Trintilliex Call therapist Zpak, mucinex  F/U 6 weeks

## 2016-10-08 NOTE — Assessment & Plan Note (Addendum)
Worsening situational depression with separation from her husband and having to leave her home. If things aren't significant disarray for her right now this affecting her work and concentration. She is not very productive and I think may be having more errors at work than she lets on. I think she will acquire at least a week at a work to get some things in order with her home life so that she can return to work. I also recommend that she start with the therapist associated with her job as a provide this for free for 6 weeks. Now we will continue the current dose on her Trintillix Plan to return next Monday the 30th

## 2016-10-08 NOTE — Progress Notes (Signed)
Subjective:    Patient ID: Mary Robles, female    DOB: [birth year], 40 y.o.   MRN: [id number]  Patient presents for 2 month F/U (is fasting) and Illness (x5 days- nonproductive cough, sinus pressure, HA, scratchy throat, fatigue)  Pt here for Follow-up. 10 days ago she sent an email stating that she was overwhelmed very stressed out and she fell and she couldn't handle it. We called her multiple times and sent emails back advising her to go to the emergency room and she was suicidal. She states that although she was separated from her husband decided to stay roommates but then around the same time she sent email he decided to walk out on her take his clothing took some of the furnishings in the home and left. She was left with the rent which she cannot pay and had to move out the apartment. She packed up the last 8 years of her life into a store June and she is currently staying with a friend. She is having difficulty at work she started receiving notification that she's been reprimanded and will be put on some type of disciplinary action but she is not sure what the issue is at work. She is backed up on her caseload and she feels like she cannot function. She is requesting a week at a work to get things in order for herself. She is taking the Trintillex does feel like this helps and was doing well on this until the recent events. She is sleeping okay her appetite is okay. She does have therapy that she can get through her work and she plans to call them today she had just been seen a therapist that she will works with better was more of a casual encounter.  She's had cough and sinus pressure and sore throat for the past 56 days. No fever. She's been taking over-the-counter Mucinex but symptoms are not improving.    Review Of Systems:  GEN-+fatigue, fever, weight loss,weakness, recent illness HEENT- denies eye drainage, change in vision, +nasal discharge, CVS- denies chest pain,  palpitations RESP- denies SOB, cough, wheeze ABD- denies N/V, change in stools, abd pain GU- denies dysuria, hematuria, dribbling, incontinence MSK- denies joint pain, muscle aches, injury Neuro- denies headache, dizziness, syncope, seizure activity       Objective:    BP 136/70 (BP Location: Left Arm, Patient Position: Sitting, Cuff Size: Normal)   Pulse 84   Temp 98.1 F (36.7 C) (Oral)   Resp 12   Ht 5\' 6"  (1.676 m)   Wt 188 lb (85.3 kg)   LMP 04/17/2014   SpO2 99% Comment: RA  BMI 30.34 kg/m  GEN- NAD, alert and oriented x3 HEENT- PERRL, EOMI, non injected sclera, pink conjunctiva, MMM, oropharynx mild injection, TM clear bilat no effusion,  + maxillary sinus tenderness, inflammed turbinates,  Nasal drainage  Neck- Supple, no LAD CVS- RRR, no murmur RESP-CTAB Psych- depressed affect, not anxious, no SI/HI, well groomed, no hallucinations  EXT- No edema Pulses- Radial 2+         Assessment & Plan:      Problem List Items Addressed This Visit    MDD (major depressive disorder)    Worsening situational depression with separation from her husband and having to leave her home. If things aren't significant disarray for her right now this affecting her work and concentration. She is not very productive and I think may be having more errors at work than she lets on. I think  she will acquire at least a week at a work to get some things in order with her home life so that she can return to work. I also recommend that she start with the therapist associated with her job as a provide this for free for 6 weeks. Now we will continue the current dose on her Trintillix Plan to return next Monday the 30th       Hypertension - Primary    Well controlled, continue clonidine       Other Visit Diagnoses    Acute URI       zpak if not improved, continue, mucinex, xyzal   Relevant Medications   azithromycin (ZITHROMAX) 250 MG tablet      Note: This dictation was prepared with  Dragon dictation along with smaller phrase technology. Any transcriptional errors that result from this process are unintentional.

## 2016-10-09 ENCOUNTER — Encounter: Payer: Self-pay | Admitting: *Deleted

## 2016-10-09 NOTE — Telephone Encounter (Signed)
Received completed FMLA forms from provider.   No charge per provider.   Faxed to Rogelia Boga at [phone].

## 2016-10-12 ENCOUNTER — Encounter: Payer: Self-pay | Admitting: Family Medicine

## 2016-11-19 ENCOUNTER — Encounter: Payer: Self-pay | Admitting: Family Medicine

## 2016-11-19 ENCOUNTER — Ambulatory Visit (INDEPENDENT_AMBULATORY_CARE_PROVIDER_SITE_OTHER): Payer: 59 | Admitting: Family Medicine

## 2016-11-19 VITALS — BP 128/66 | HR 88 | Temp 98.5°F | Resp 14 | Ht 66.0 in | Wt 190.0 lb

## 2016-11-19 DIAGNOSIS — F322 Major depressive disorder, single episode, severe without psychotic features: Secondary | ICD-10-CM | POA: Diagnosis not present

## 2016-11-19 DIAGNOSIS — J0110 Acute frontal sinusitis, unspecified: Secondary | ICD-10-CM

## 2016-11-19 DIAGNOSIS — I10 Essential (primary) hypertension: Secondary | ICD-10-CM | POA: Diagnosis not present

## 2016-11-19 DIAGNOSIS — F411 Generalized anxiety disorder: Secondary | ICD-10-CM | POA: Diagnosis not present

## 2016-11-19 DIAGNOSIS — R12 Heartburn: Secondary | ICD-10-CM

## 2016-11-19 MED ORDER — FLUCONAZOLE 150 MG PO TABS: 150.0000 mg | ORAL_TABLET | Freq: Once | ORAL | 1 refills | Status: AC

## 2016-11-19 MED ORDER — AMOXICILLIN 875 MG PO TABS: 875.0000 mg | ORAL_TABLET | Freq: Two times a day (BID) | ORAL | 0 refills | Status: DC

## 2016-11-19 NOTE — Progress Notes (Signed)
Subjective:    Patient ID: Mary Robles, female    DOB: 08/30/1976, 40 y.o.   MRN: [id number]  Patient presents for 6 week F/U (is fasting) and Illness (x2 weeks- sinus pressure, HA, nausea, nonproductive cough) Patient her for interim follow-up on her depression, she recently separated from her husband. She is having significant problems at work as well as at home coping with this. At her last visit I recommended that she start therapy which she did have her her job. She was also continued on Trintillix   She has been meeting with a good friend of person was a therapist but it is not formal therapy. She feels like she has helped her and she thinks it is large component to her stress is still her work. She is actually going to switch jobs. She is sleeping much better but still gets anxious on Sunday nights when she is think about going back to work on Monday. She is now actually caught up with all of her duties  She's had sinus pressure drainage mostly in the frontal area sore throat some mild nausea for the past 2 weeks. She's also had a burning sensation in her chest she did try some Tums. She denies any actual chest pain. Occasionally gets short of breath when she is walking around but she thinks because of the weight she has been on. She has not had any fever. She is using mucinex    Review Of Systems:  GEN- denies fatigue, fever, weight loss,weakness, recent illness HEENT- denies eye drainage, change in vision, +nasal discharge, CVS- denies chest pain, palpitations RESP- denies SOB, +cough, wheeze ABD- denies N/V, change in stools, abd pain GU- denies dysuria, hematuria, dribbling, incontinence MSK- denies joint pain, muscle aches, injury Neuro- denies headache, dizziness, syncope, seizure activity       Objective:    BP 128/66 (BP Location: Left Arm, Patient Position: Sitting, Cuff Size: Large)   Pulse 88   Temp 98.5 F (36.9 C) (Oral)   Resp 14   Ht 5\' 6"  (1.676 m)   Wt  190 lb (86.2 kg)   LMP 04/17/2014   SpO2 98%   BMI 30.67 kg/m  GEN- NAD, alert and oriented x3 HEENT- PERRL, EOMI, non injected sclera, pink conjunctiva, MMM, oropharynx clear , TM clear bilat no effusion,  + frontal  sinus tenderness, inflammed turbinates,+  Nasal drainage  Neck- Supple, no LAD CVS- RRR, no murmur RESP-CTAB ABD-NABS,soft,NT,ND  Pysch- normal affect and mood  EXT- No edema Pulses- Radial 2+          Assessment & Plan:      Problem List Items Addressed This Visit    MDD (major depressive disorder)    She does appear to be a better mood today. She's always had difficulty with her job keeping up and just overall to maintenance. Recommended she continue seeing her friends who is a licensed therapist even though he was not actually have notation from the sessions. Continue Trintillex which gives some cognitive benefits as well   She has acute sinusitis today given her amoxicillin for 7 days she is to use Flonase nasal saline rinse Mucinex      Hypertension    Blood pressure control agitation medication      GAD (generalized anxiety disorder)    Other Visit Diagnoses    Acute non-recurrent frontal sinusitis    -  Primary   Relevant Medications   amoxicillin (AMOXIL) 875 MG tablet   fluconazole (DIFLUCAN)  150 MG tablet   Heartburn       For 10 days given samples of Nexium      Note: This dictation was prepared with Dragon dictation along with smaller phrase technology. Any transcriptional errors that result from this process are unintentional.

## 2016-11-19 NOTE — Patient Instructions (Signed)
Add back flonase Continue mucinex amoxicllin F/U 4 months Physical

## 2016-11-19 NOTE — Assessment & Plan Note (Signed)
Blood pressure control agitation medication

## 2016-11-19 NOTE — Assessment & Plan Note (Signed)
She does appear to be a better mood today. She's always had difficulty with her job keeping up and just overall to maintenance. Recommended she continue seeing her friends who is a licensed therapist even though he was not actually have notation from the sessions. Continue Trintillex which gives some cognitive benefits as well   She has acute sinusitis today given her amoxicillin for 7 days she is to use Flonase nasal saline rinse Mucinex

## 2016-11-27 ENCOUNTER — Other Ambulatory Visit: Payer: Self-pay | Admitting: Family Medicine

## 2016-12-17 HISTORY — PX: PELVIC LAPAROSCOPY: SHX162

## 2016-12-24 ENCOUNTER — Ambulatory Visit (INDEPENDENT_AMBULATORY_CARE_PROVIDER_SITE_OTHER): Payer: 59 | Admitting: Family Medicine

## 2016-12-24 ENCOUNTER — Encounter: Payer: Self-pay | Admitting: Family Medicine

## 2016-12-24 VITALS — BP 130/88 | HR 80 | Temp 98.0°F | Resp 18 | Wt 190.6 lb

## 2016-12-24 DIAGNOSIS — J301 Allergic rhinitis due to pollen: Secondary | ICD-10-CM | POA: Diagnosis not present

## 2016-12-24 DIAGNOSIS — J0101 Acute recurrent maxillary sinusitis: Secondary | ICD-10-CM

## 2016-12-24 MED ORDER — METHYLPREDNISOLONE ACETATE 40 MG/ML IJ SUSP
40.0000 mg | Freq: Once | INTRAMUSCULAR | Status: AC
  Administered 2016-12-24: 40 mg via INTRAMUSCULAR

## 2016-12-24 NOTE — Addendum Note (Signed)
Addended by: Sheral Flow on: 12/24/2016 02:53 PM   Modules accepted: Orders

## 2016-12-24 NOTE — Progress Notes (Signed)
   Subjective:    Patient ID: Mary Robles, female    DOB: [birth year], 41 y.o.   MRN: [id number]  Patient presents for sick x 1 week (chest congestion, dry nose-bloody secretions in the morning)   Pt here with recurrent sinus pressure, had blood tinged secretations from nose this morning. She's been seen multiple times with nasal congestion or drainage she's been treated for sinusitis last treated first week in December with amoxicillin. Her symptoms do improve in between. She's been using Flonase and Xyzal. This morning she had some blood-tinged mucus from her nose therefore came in for evaluation. She has not had knee fever has had mild cough no significant congestion.    Review Of Systems:  GEN- denies fatigue, fever, weight loss,weakness, recent illness HEENT- denies eye drainage, change in vision, +nasal discharge, CVS- denies chest pain, palpitations RESP- denies SOB, cough, wheeze ABD- denies N/V, change in stools, abd pain Neuro- denies headache, dizziness, syncope, seizure activity       Objective:    BP 130/88 (BP Location: Right Arm, Patient Position: Sitting, Cuff Size: Large)   Pulse 80   Temp 98 F (36.7 C) (Oral)   Resp 18   Wt 190 lb 9.6 oz (86.5 kg)   LMP 04/17/2014   BMI 30.76 kg/m  GEN- NAD, alert and oriented x3 HEENT- PERRL, EOMI, non injected sclera, pink conjunctiva, MMM, oropharynx clear, nares dry blood ant nares bilat, clear mucous, maxillary sinus NT, +enlarged turbinates  Neck- Supple, no thyromegaly CVS- RRR, no murmur RESP-CTAB        Assessment & Plan:      Problem List Items Addressed This Visit    None    Visit Diagnoses    Acute recurrent maxillary sinusitis    -  Primary   Recurrent symptoms despiste, treatment, unclear if chronic sinusitis, or more allergic/rhinitis issues. D/C flonase may be causing the irritation with bleeding. OBtain CT max face To evaluate for chronic sinusitis or other nasal pathology. We'll also refer her  to ear nose and throat. I've given her a shot of Depo-Medrol this may help some of the congestion. Lab for continue with the nasal saline rinse for now we'll hold on antibiotics unless acute infection seen on the scan    Relevant Orders   Ambulatory referral to ENT   CT Maxillofacial WO CM   Chronic seasonal allergic rhinitis due to pollen       Relevant Orders   Ambulatory referral to ENT   CT Maxillofacial WO CM      Note: This dictation was prepared with Dragon dictation along with smaller phrase technology. Any transcriptional errors that result from this process are unintentional.

## 2016-12-24 NOTE — Patient Instructions (Signed)
Continue nasal saline Stop the flonase Steroid shot given today  CT of sinuses to be done  Referral to ENT

## 2016-12-26 ENCOUNTER — Ambulatory Visit
Admission: RE | Admit: 2016-12-26 | Discharge: 2016-12-26 | Disposition: A | Discharge status: Home / Self Care | Payer: 59 | Source: Ambulatory Visit | Attending: Family Medicine | Admitting: Family Medicine

## 2016-12-26 DIAGNOSIS — J301 Allergic rhinitis due to pollen: Secondary | ICD-10-CM

## 2016-12-26 DIAGNOSIS — J0110 Acute frontal sinusitis, unspecified: Secondary | ICD-10-CM | POA: Diagnosis not present

## 2016-12-26 DIAGNOSIS — J0101 Acute recurrent maxillary sinusitis: Secondary | ICD-10-CM

## 2017-01-22 DIAGNOSIS — J343 Hypertrophy of nasal turbinates: Secondary | ICD-10-CM | POA: Insufficient documentation

## 2017-01-22 DIAGNOSIS — J342 Deviated nasal septum: Secondary | ICD-10-CM | POA: Insufficient documentation

## 2017-01-22 DIAGNOSIS — R0981 Nasal congestion: Secondary | ICD-10-CM | POA: Diagnosis not present

## 2017-02-20 NOTE — Progress Notes (Signed)
41 y.o. [id number] Married [place] American female here for annual exam.    Has abdominal bloating.  Lasts for a couple of days and then resolves. No change in bowel function.   Depression treated through PCP.  Labs through PCP as well.   Father had a stroke as passed from a stroke. She did not have contact with him.   Separated from husband who left her.  New partner and has already had negative STD testing last year in May 2017.   PCP: Vic Blackbird, MD    Patient's last menstrual period was 04/17/2014.           Sexually active: Yes.   female The current method of family planning is tubal ligation/Hysterectomy.   Ovaries remain.  Exercising: No.   Smoker:  no  Health Maintenance: Pap: 01-21-14 Neg:Neg HR HPV;07-07-09 Neg.  Benign cervix on final cervical pathology from hysterectomy.  History of abnormal Pap:  Yes,2000 Hx dysplasia and cryotherapy to cervix.  MMG: 12-01-15 Density B/Neg/BiRads1:TBC--PATIENT TO SCHEDULE APPT.  Status post right breat biopsy Jan 2016 - fibroadenoma. Colonoscopy:  N/A BMD:   n/a  Result  n/a TDaP:  2013 Gardasil:   N/A Hep C and HIV negative May 2017. Screening Labs:  Hb today: PCP, Urine today: not done   reports that she has never smoked. She has never used smokeless tobacco. She reports that she drinks alcohol. She reports that she does not use drugs.  Past Medical History:  Diagnosis Date  . Anxiety   . Asthma    no symptoms in "years", no inhaler  . Depression   . Herpes simplex   . 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 Hypertension   . Sickle cell trait ([place])   . Vitamin D deficiency     Past Surgical History:  Procedure Laterality Date  . ABDOMINAL HYSTERECTOMY N/A 05/04/2014   Procedure: HYSTERECTOMY ABDOMINAL with BILATERAL SALPINGECTOMY ;  Surgeon: Everardo All Amundson de Berton Lan, MD;  Location: [facility];  Service: Gynecology;  Laterality: N/A;  . BREAST BIOPSY Right Jan. 2016   benign fibroadenoma  . FOOT SURGERY    . LAPAROTOMY N/A  05/04/2014   Procedure: REMOVAL OF ABD WALL MASS ;  Surgeon: Adin Hector, MD;  Location: [facility];  Service: General;  Laterality: N/A;  . LIPOMA EXCISION N/A 05/04/2014   Procedure: EXCISION LIPOMA of abdominal wall;  Surgeon: Jamey Reas de Berton Lan, MD;  Location: [facility];  Service: Gynecology;  Laterality: N/A;  . TUBAL LIGATION      Current Outpatient Prescriptions  Medication Sig Dispense Refill  . benazepril (LOTENSIN) 20 MG tablet TAKE ONE TABLET BY MOUTH ONCE DAILY 90 tablet 1  . cloNIDine (CATAPRES) 0.1 MG tablet TAKE ONE TABLET BY MOUTH TWICE DAILY 180 tablet 1  . fluticasone (FLONASE) 50 MCG/ACT nasal spray Place 1 spray into the nose daily.    Marland Kitchen levocetirizine (XYZAL) 5 MG tablet TAKE ONE TABLET BY MOUTH EVERY  EVENING 30 tablet 3  . vortioxetine HBr (TRINTELLIX) 10 MG TABS Take 1 tablet (10 mg total) by mouth daily. 30 tablet 6  . cholecalciferol (VITAMIN D) 1000 units tablet Take 1,000 Units by mouth daily.     No current facility-administered medications for this visit.     Family History  Problem Relation Age of Onset  . Diabetes Mother   . Thyroid disease Mother   . Stroke Father   . Mental retardation Maternal Aunt   . Diabetes Maternal  Grandmother     ROS:  Pertinent items are noted in HPI.  Otherwise, a comprehensive ROS was negative.  Exam:   BP 132/78 (BP Location: Right Arm, Patient Position: Sitting, Cuff Size: Normal)   Pulse 80   Resp 16   Ht 5\' 6"  (1.676 m)   Wt 192 lb 6.4 oz (87.3 kg)   LMP 04/17/2014   BMI 31.05 kg/m     General appearance: alert, cooperative and appears stated age Head: Normocephalic, without obvious abnormality, atraumatic Neck: no adenopathy, supple, symmetrical, trachea midline and thyroid normal to inspection and palpation Lungs: clear to auscultation bilaterally Breasts: normal appearance, no masses or tenderness, No nipple retraction or dimpling, No nipple discharge or bleeding, No axillary or supraclavicular  adenopathy Heart: regular rate and rhythm Abdomen: soft, non-tender; no masses, no organomegaly Extremities: extremities normal, atraumatic, no cyanosis or edema Skin: Skin color, texture, turgor normal. No rashes or lesions Lymph nodes: Cervical, supraclavicular, and axillary nodes normal. No abnormal inguinal nodes palpated Neurologic: Grossly normal  Pelvic: External genitalia:  no lesions              Urethra:  normal appearing urethra with no masses, tenderness or lesions              Bartholins and Skenes: normal                 Vagina: normal appearing vagina with normal color and discharge, no lesions              Cervix:  Absent.               Pap taken: No. Bimanual Exam:  Uterus:  Absent.               Adnexa: no mass, fullness, tenderness              Rectal exam: Yes.  .  Confirms.              Anus:  normal sphincter tone, no lesions  Chaperone was present for exam.  Assessment:   Well woman visit with normal exam. Status post robotic TLH/bilateral salpingectomy.  Hx right breast fibroadenoma.   Plan: Mammogram screening discussed.  Patient will schedule at [facility]. Recommended self breast awareness. Pap and HR HPV as above. Guidelines for Calcium, Vitamin D, regular exercise program including cardiovascular and weight bearing exercise. Follow up annually and prn.      After visit summary provided.

## 2017-02-21 ENCOUNTER — Ambulatory Visit (INDEPENDENT_AMBULATORY_CARE_PROVIDER_SITE_OTHER): Payer: 59 | Admitting: Obstetrics and Gynecology

## 2017-02-21 ENCOUNTER — Encounter: Payer: Self-pay | Admitting: Obstetrics and Gynecology

## 2017-02-21 VITALS — BP 132/78 | HR 80 | Resp 16 | Ht 66.0 in | Wt 192.4 lb

## 2017-02-21 DIAGNOSIS — Z01419 Encounter for gynecological examination (general) (routine) without abnormal findings: Secondary | ICD-10-CM

## 2017-02-21 NOTE — Patient Instructions (Signed)

## 2017-02-26 ENCOUNTER — Other Ambulatory Visit: Payer: Self-pay | Admitting: Family Medicine

## 2017-03-08 ENCOUNTER — Ambulatory Visit (HOSPITAL_COMMUNITY)
Admission: EM | Admit: 2017-03-08 | Discharge: 2017-03-08 | Disposition: A | Discharge status: Home / Self Care | Payer: 59 | Attending: Internal Medicine | Admitting: Internal Medicine

## 2017-03-08 ENCOUNTER — Encounter (HOSPITAL_COMMUNITY): Payer: Self-pay

## 2017-03-08 DIAGNOSIS — R102 Pelvic and perineal pain: Secondary | ICD-10-CM

## 2017-03-08 DIAGNOSIS — J00 Acute nasopharyngitis [common cold]: Secondary | ICD-10-CM

## 2017-03-08 LAB — POCT URINALYSIS DIP (DEVICE)
Bilirubin Urine: NEGATIVE
Glucose, UA: NEGATIVE mg/dL
Ketones, ur: NEGATIVE mg/dL
Leukocytes, UA: NEGATIVE
NITRITE: NEGATIVE
PH: 7 (ref 5.0–8.0)
PROTEIN: NEGATIVE mg/dL
Specific Gravity, Urine: 1.015 (ref 1.005–1.030)
UROBILINOGEN UA: 0.2 mg/dL (ref 0.0–1.0)

## 2017-03-08 MED ORDER — IPRATROPIUM BROMIDE 0.06 % NA SOLN: 2.0000 | Freq: Four times a day (QID) | NASAL | 0 refills | Status: DC

## 2017-03-08 MED ORDER — NAPROXEN 500 MG PO TABS
500.0000 mg | ORAL_TABLET | Freq: Two times a day (BID) | ORAL | 0 refills | Status: DC
Start: 2017-03-08 — End: 2017-03-26

## 2017-03-08 NOTE — ED Provider Notes (Signed)
CSN: [id number]     Arrival date & time 03/08/17  1743 History   First MD Initiated Contact with Patient 03/08/17 1800     Chief Complaint  Patient presents with  . Abdominal Pain   (Consider location/radiation/quality/duration/timing/severity/associated sxs/prior Treatment) Patient c/o lower abdominal pain starting yesterday.  She has been having some UrI sx's for 2 days.  She has been off her bp medicine.     The history is provided by the patient.  Abdominal Pain  Pain location:  Suprapubic Pain quality: aching   Pain radiates to:  Does not radiate Pain severity:  Mild Onset quality:  Sudden Duration:  1 day Timing:  Constant Progression:  Worsening Chronicity:  New Relieved by:  Nothing Worsened by:  Nothing Ineffective treatments:  None tried Associated symptoms: fatigue     Past Medical History:  Diagnosis Date  . Anxiety   . Asthma    no symptoms in "years", no inhaler  . Depression   . Herpes simplex   . 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 Hypertension   . Sickle cell trait ([place])   . Vitamin D deficiency    Past Surgical History:  Procedure Laterality Date  . ABDOMINAL HYSTERECTOMY N/A 05/04/2014   Procedure: HYSTERECTOMY ABDOMINAL with BILATERAL SALPINGECTOMY ;  Surgeon: Everardo All Amundson de Berton Lan, MD;  Location: [facility];  Service: Gynecology;  Laterality: N/A;  . BREAST BIOPSY Right Jan. 2016   benign fibroadenoma  . FOOT SURGERY    . LAPAROTOMY N/A 05/04/2014   Procedure: REMOVAL OF ABD WALL MASS ;  Surgeon: Adin Hector, MD;  Location: [facility];  Service: General;  Laterality: N/A;  . LIPOMA EXCISION N/A 05/04/2014   Procedure: EXCISION LIPOMA of abdominal wall;  Surgeon: Jamey Reas de Berton Lan, MD;  Location: [facility];  Service: Gynecology;  Laterality: N/A;  . TUBAL LIGATION     Family History  Problem Relation Age of Onset  . Diabetes Mother   . Thyroid disease Mother   . Stroke Father   . Mental retardation Maternal Aunt   . Diabetes  Maternal Grandmother    Social History  Substance Use Topics  . Smoking status: Never Smoker  . Smokeless tobacco: Never Used  . Alcohol use 0.0 oz/week     Comment: 2-3 x/year   OB History    Gravida Para Term Preterm AB Living   4 2     1 2    SAB TAB Ectopic Multiple Live Births     1           Review of Systems  Constitutional: Positive for fatigue.  HENT: Positive for congestion.   Eyes: Negative.   Respiratory: Negative.   Cardiovascular: Negative.   Gastrointestinal: Positive for abdominal pain.  Endocrine: Negative.   Genitourinary: Negative.   Musculoskeletal: Negative.   Allergic/Immunologic: Negative.   Neurological: Negative.   Hematological: Negative.   Psychiatric/Behavioral: Negative.     Allergies  Vicodin [hydrocodone-acetaminophen] and Betadine [povidone iodine]  Home Medications   Prior to Admission medications   Medication Sig Start Date End Date Taking? Authorizing Provider  benazepril (LOTENSIN) 20 MG tablet TAKE ONE TABLET BY MOUTH ONCE DAILY 02/27/17   Alycia Rossetti, MD  cholecalciferol (VITAMIN D) 1000 units tablet Take 1,000 Units by mouth daily.    Historical Provider, MD  cloNIDine (CATAPRES) 0.1 MG tablet TAKE ONE TABLET BY MOUTH TWICE DAILY 09/17/16   Alycia Rossetti, MD  fluticasone [facility]) 50 MCG/ACT  nasal spray Place 1 spray into the nose daily. 01/22/17   Historical Provider, MD  ipratropium (ATROVENT) 0.06 % nasal spray Place 2 sprays into both nostrils 4 (four) times daily. 03/08/17   Lysbeth Penner, FNP  levocetirizine (XYZAL) 5 MG tablet TAKE ONE TABLET BY MOUTH EVERY  EVENING 11/27/16   Alycia Rossetti, MD  naproxen (NAPROSYN) 500 MG tablet Take 1 tablet (500 mg total) by mouth 2 (two) times daily with a meal. 03/08/17   Lysbeth Penner, FNP  vortioxetine HBr (TRINTELLIX) 10 MG TABS Take 1 tablet (10 mg total) by mouth daily. 08/06/16   Alycia Rossetti, MD   Meds Ordered and Administered this Visit  Medications - No data to  display  BP (!) 182/110   Pulse 82   Temp 99 F (37.2 C) (Oral)   Resp 20   LMP 04/17/2014   SpO2 100%  No data found.   Physical Exam  Constitutional: She is oriented to person, place, and time. She appears well-developed and well-nourished.  HENT:  Head: Normocephalic and atraumatic.  Right Ear: External ear normal.  Left Ear: External ear normal.  Mouth/Throat: Oropharynx is clear and moist.  Eyes: Conjunctivae and EOM are normal. Pupils are equal, round, and reactive to light.  Neck: Normal range of motion. Neck supple.  Cardiovascular: Normal rate, regular rhythm and normal heart sounds.   Pulmonary/Chest: Effort normal and breath sounds normal.  Abdominal: Soft. Bowel sounds are normal. There is tenderness.  Pelvis/ lower abdomen tender.  Musculoskeletal: Normal range of motion.  Neurological: She is alert and oriented to person, place, and time.  Nursing note and vitals reviewed.   Urgent Care Course     Procedures (including critical care time)  Labs Review Labs Reviewed  POCT URINALYSIS DIP (DEVICE) - Abnormal; Notable for the following:       Result Value   Hgb urine dipstick SMALL (*)    All other components within normal limits    Imaging Review No results found.   Visual Acuity Review  Right Eye Distance:   Left Eye Distance:   Bilateral Distance:    Right Eye Near:   Left Eye Near:    Bilateral Near:         MDM   1. Pelvic pain in female   2. Acute nasopharyngitis    Explained may need pelvic exam and patient declines Explained that this could be an ovarian cyst or possibly  Scar tissue from prior surgery.  Advise to follow up with Pcp or [place], FNP 03/08/17 1950

## 2017-03-08 NOTE — ED Triage Notes (Signed)
Pt stated the abdominal  pain started yesterday, took OTC ibuprofen no relief.  Pt stated she thinks she has a sinus infection, having facial pressure, nasal congestion, no fever, using flonase, nasal saline and allergy medication and does have a ENT appoint. Coming up.

## 2017-03-08 NOTE — ED Notes (Addendum)
EDP aware of initial BP. PT reports she does not wish to speak to EDP about repeat BP. PT reports she is going to pick up her BP med right now from the pharmacy. PT wishes to leave now.

## 2017-03-20 ENCOUNTER — Encounter: Payer: Self-pay | Admitting: Family Medicine

## 2017-03-21 ENCOUNTER — Ambulatory Visit (INDEPENDENT_AMBULATORY_CARE_PROVIDER_SITE_OTHER): Payer: 59 | Admitting: Obstetrics and Gynecology

## 2017-03-21 ENCOUNTER — Telehealth: Payer: Self-pay | Admitting: Obstetrics and Gynecology

## 2017-03-21 VITALS — BP 140/100 | HR 78 | Temp 98.0°F | Resp 16 | Ht 66.0 in | Wt 194.0 lb

## 2017-03-21 DIAGNOSIS — R1031 Right lower quadrant pain: Secondary | ICD-10-CM | POA: Diagnosis not present

## 2017-03-21 DIAGNOSIS — M25559 Pain in unspecified hip: Secondary | ICD-10-CM

## 2017-03-21 DIAGNOSIS — R102 Pelvic and perineal pain: Secondary | ICD-10-CM | POA: Diagnosis not present

## 2017-03-21 DIAGNOSIS — R8290 Unspecified abnormal findings in urine: Secondary | ICD-10-CM | POA: Diagnosis not present

## 2017-03-21 LAB — POCT URINALYSIS DIPSTICK
BILIRUBIN UA: NEGATIVE
Glucose, UA: NEGATIVE
KETONES UA: NEGATIVE
Leukocytes, UA: NEGATIVE
Nitrite, UA: POSITIVE
PH UA: 5 (ref 5.0–8.0)
PROTEIN UA: NEGATIVE
Urobilinogen, UA: NEGATIVE (ref ?–2.0)

## 2017-03-21 MED ORDER — SULFAMETHOXAZOLE-TRIMETHOPRIM 800-160 MG PO TABS: 1.0000 | ORAL_TABLET | Freq: Two times a day (BID) | ORAL | 0 refills | Status: DC

## 2017-03-21 NOTE — Telephone Encounter (Signed)
Patient left voicemail that she would like to speak with someone about some abdominal discomfort she is having.

## 2017-03-21 NOTE — Progress Notes (Signed)
GYNECOLOGY  VISIT   HPI: 41 y.o.   Married  [place] American  female   [phone] with Patient's last menstrual period was 04/17/2014.   here for pelvic pain which is worse in RLQ area. Has had since was here for AEX on 02/21/17.  Patient went to urgent care 2 weeks ago and everything was normal.   Pain is across her lower abdomen.  Naprosyn helps pain. Some nausea.  Good appetite.  Normal bowel function, maybe more loose. Denies fever and chills.  No blood in the urine.   No new partners.  Sees PCP on Tuesday.  Will recheck her BP then.   Urine Dip: Pos.Nitrites, Trace RBCs  GYNECOLOGIC HISTORY: Patient's last menstrual period was 04/17/2014. Contraception:  Tubal/TAH-Bil.Salpingectomy Menopausal hormone therapy:  none Last mammogram: 12-01-15 Density B/Neg/BiRads1:TBC Last pap smear: 01-21-14 Neg:Neg HR HPV;07-07-09 Neg (Hx of dysplasia and cryotherapy to cervix in 2000)        OB History    Gravida Para Term Preterm AB Living   4 2     1 2    SAB TAB Ectopic Multiple Live Births     1               Patient Active Problem List   Diagnosis Date Noted  . MDD (major depressive disorder) 07/16/2016  . Asthma, mild 04/11/2015  . GAD (generalized anxiety disorder) 02/08/2015  . Hyperlipidemia 07/09/2014  . Insomnia 07/09/2014  . Other malaise and fatigue 07/09/2014  . Nasal congestion 07/09/2014  . Urinary problem 07/09/2014  . Lipoma of abdominal wall s/p excision 05/04/2014 05/19/2014  . Status post total abdominal hysterectomy 05/04/2014  . Hypertension   . Vitamin D deficiency     Past Medical History:  Diagnosis Date  . Anxiety   . Asthma    no symptoms in "years", no inhaler  . Depression   . Herpes simplex   . Hyperlysinemia ([place])   . Hypertension   . Sickle cell trait ([place])   . Vitamin D deficiency     Past Surgical History:  Procedure Laterality Date  . ABDOMINAL HYSTERECTOMY N/A 05/04/2014   Procedure: HYSTERECTOMY ABDOMINAL with BILATERAL  SALPINGECTOMY ;  Surgeon: Everardo All Amundson de Berton Lan, MD;  Location: [facility];  Service: Gynecology;  Laterality: N/A;  . BREAST BIOPSY Right Jan. 2016   benign fibroadenoma  . FOOT SURGERY    . LAPAROTOMY N/A 05/04/2014   Procedure: REMOVAL OF ABD WALL MASS ;  Surgeon: Adin Hector, MD;  Location: [facility];  Service: General;  Laterality: N/A;  . LIPOMA EXCISION N/A 05/04/2014   Procedure: EXCISION LIPOMA of abdominal wall;  Surgeon: Jamey Reas de Berton Lan, MD;  Location: [facility];  Service: Gynecology;  Laterality: N/A;  . TUBAL LIGATION      Current Outpatient Prescriptions  Medication Sig Dispense Refill  . benazepril (LOTENSIN) 20 MG tablet TAKE ONE TABLET BY MOUTH ONCE DAILY 90 tablet 1  . cloNIDine (CATAPRES) 0.1 MG tablet TAKE ONE TABLET BY MOUTH TWICE DAILY 180 tablet 1  . fluticasone (FLONASE) 50 MCG/ACT nasal spray Place 1 spray into the nose daily.    Marland Kitchen ipratropium (ATROVENT) 0.06 % nasal spray Place 2 sprays into both nostrils 4 (four) times daily. 15 mL 0  . levocetirizine (XYZAL) 5 MG tablet TAKE ONE TABLET BY MOUTH EVERY  EVENING 30 tablet 3  . naproxen (NAPROSYN) 500 MG tablet Take 1 tablet (500 mg total) by mouth 2 (two) times  daily with a meal. 20 tablet 0  . vortioxetine HBr (TRINTELLIX) 10 MG TABS Take 1 tablet (10 mg total) by mouth daily. 30 tablet 6   No current facility-administered medications for this visit.      ALLERGIES: Vicodin [hydrocodone-acetaminophen] and Betadine [povidone iodine]  Family History  Problem Relation Age of Onset  . Diabetes Mother   . Thyroid disease Mother   . Stroke Father   . Mental retardation Maternal Aunt   . Diabetes Maternal Grandmother     Social History   Social History  . Marital status: Married    Spouse name: N/A  . Number of children: N/A  . Years of education: N/A   Occupational History  . Not on file.   Social History Main Topics  . Smoking status: Never Smoker  . Smokeless tobacco:  Never Used  . Alcohol use 0.0 oz/week     Comment: 2-3 x/year  . Drug use: No  . Sexual activity: Yes    Partners: Male    Birth control/ protection: Surgical     Comment: BTSP 2005/TAH/BSO   Other Topics Concern  . Not on file   Social History Narrative  . No narrative on file    ROS:  Pertinent items are noted in HPI.  PHYSICAL EXAMINATION:    BP (!) 140/100 (BP Location: Right Arm, Patient Position: Sitting, Cuff Size: Normal)   Pulse 78   Temp 98 F (36.7 C) (Oral)   Resp 16   Ht 5\' 6"  (1.676 m)   Wt 194 lb (88 kg)   LMP 04/17/2014   BMI 31.31 kg/m     General appearance: alert, cooperative and appears stated age   Abdomen: soft, non-tender, no masses,  no organomegaly   Pelvic: External genitalia:  no lesions              Urethra:  normal appearing urethra with no masses, tenderness or lesions              Bartholins and Skenes: normal                 Vagina: normal appearing vagina with normal color and discharge, no lesions              Cervix:  Absent.                Bimanual Exam:  Uterus:  Absent.              Adnexa: no mass, fullness, tenderness on left.  Mild tenderness on the right with no palpable mass.              Rectal exam: Yes.  .  Confirms.              Anus:  normal sphincter tone, no lesions  Chaperone was present for exam.  ASSESSMENT  Status post TAH/bilateral salpingectomy.  RLQ pain.  No acute abdomen. I doubt appendicitis. Abnormal urine.  Possible UTI. Elevated BP on antiHTN.  PLAN  Urine micro/culture. Start Bactrim DS po bid x 3 days. Patient will call back in 3 days if pain persists.   Will then proceed with pelvic ultrasound to check adnexa.  See PCP next week for recheck of BP.  An After Visit Summary was printed and given to the patient.  _15_____ minutes face to face time of which over 50% was spent in counseling.

## 2017-03-21 NOTE — Telephone Encounter (Signed)
Spoke with patient. Patient scheduled for OV today at 3:30pm with Dr. Quincy Simmonds. Patient is agreeable to date and time.  Routing to provider for final review. Patient is agreeable to disposition. Will close encounter.

## 2017-03-21 NOTE — Telephone Encounter (Signed)
Spoke with patient. Patient states she is continuing to have pelvic pain that started after her last AEX 02/21/17. Patient states she was seen by Urgent Care on 03/08/17 and was advised may be a cyst, f/u with gyn. Patient states pain is lower pelvis, more on right. Has had hysterectomy. Denies vaginal bleeding, fever or urinary complaints. Patient states some nausea. Recommended OV for further evaluation. Advised patient no appointment available for afternoon, would need to review schedule with Dr. Quincy Simmonds and return call. Patient states she is available today and first thing in morning.   Dr. Quincy Simmonds -please advise on scheduling?

## 2017-03-21 NOTE — Telephone Encounter (Signed)
Offer appointment for ultrasound today if there is an opening.

## 2017-03-22 LAB — URINALYSIS, MICROSCOPIC ONLY
Bacteria, UA: NONE SEEN [HPF]
CASTS: NONE SEEN [LPF]
Crystals: NONE SEEN [HPF]
WBC, UA: NONE SEEN WBC/HPF (ref <=5)
Yeast: NONE SEEN [HPF]

## 2017-03-22 LAB — URINE CULTURE: ORGANISM ID, BACTERIA: NO GROWTH

## 2017-03-23 ENCOUNTER — Encounter: Payer: Self-pay | Admitting: Obstetrics and Gynecology

## 2017-03-23 ENCOUNTER — Other Ambulatory Visit: Payer: Self-pay | Admitting: Obstetrics and Gynecology

## 2017-03-23 DIAGNOSIS — R1031 Right lower quadrant pain: Secondary | ICD-10-CM

## 2017-03-26 ENCOUNTER — Telehealth: Payer: Self-pay | Admitting: Obstetrics and Gynecology

## 2017-03-26 ENCOUNTER — Encounter: Payer: Self-pay | Admitting: Family Medicine

## 2017-03-26 ENCOUNTER — Ambulatory Visit (INDEPENDENT_AMBULATORY_CARE_PROVIDER_SITE_OTHER): Payer: 59 | Admitting: Family Medicine

## 2017-03-26 VITALS — BP 138/92 | HR 80 | Temp 98.4°F | Resp 16 | Ht 66.0 in | Wt 195.0 lb

## 2017-03-26 DIAGNOSIS — Z0000 Encounter for general adult medical examination without abnormal findings: Secondary | ICD-10-CM

## 2017-03-26 DIAGNOSIS — E559 Vitamin D deficiency, unspecified: Secondary | ICD-10-CM | POA: Diagnosis not present

## 2017-03-26 DIAGNOSIS — F322 Major depressive disorder, single episode, severe without psychotic features: Secondary | ICD-10-CM | POA: Diagnosis not present

## 2017-03-26 DIAGNOSIS — I10 Essential (primary) hypertension: Secondary | ICD-10-CM

## 2017-03-26 LAB — LIPID PANEL
Cholesterol: 172 mg/dL
HDL: 37 mg/dL — ABNORMAL LOW
LDL Cholesterol: 112 mg/dL — ABNORMAL HIGH
Total CHOL/HDL Ratio: 4.6 ratio
Triglycerides: 113 mg/dL
VLDL: 23 mg/dL

## 2017-03-26 LAB — TSH: TSH: 1.97 m[IU]/L

## 2017-03-26 LAB — CBC WITH DIFFERENTIAL/PLATELET
Basophils Absolute: 68 cells/uL (ref 0–200)
Basophils Relative: 1 %
Eosinophils Absolute: 204 cells/uL (ref 15–500)
Eosinophils Relative: 3 %
HCT: 40.8 % (ref 35.0–45.0)
HEMOGLOBIN: 14 g/dL (ref 12.0–15.0)
LYMPHS ABS: 2788 {cells}/uL (ref 850–3900)
Lymphocytes Relative: 41 %
MCH: 28.6 pg (ref 27.0–33.0)
MCHC: 34.3 g/dL (ref 32.0–36.0)
MCV: 83.3 fL (ref 80.0–100.0)
MONO ABS: 476 {cells}/uL (ref 200–950)
MPV: 9.9 fL (ref 7.5–12.5)
Monocytes Relative: 7 %
NEUTROS PCT: 48 %
Neutro Abs: 3264 cells/uL (ref 1500–7800)
PLATELETS: 314 10*3/uL (ref 140–400)
RBC: 4.9 MIL/uL (ref 3.80–5.10)
RDW: 15.3 % — AB (ref 11.0–15.0)
WBC: 6.8 10*3/uL (ref 3.8–10.8)

## 2017-03-26 LAB — COMPREHENSIVE METABOLIC PANEL WITH GFR
ALT: 18 U/L (ref 6–29)
AST: 20 U/L (ref 10–30)
Albumin: 3.9 g/dL (ref 3.6–5.1)
Alkaline Phosphatase: 42 U/L (ref 33–115)
BUN: 8 mg/dL (ref 7–25)
CO2: 25 mmol/L (ref 20–31)
Calcium: 8.5 mg/dL — ABNORMAL LOW (ref 8.6–10.2)
Chloride: 105 mmol/L (ref 98–110)
Creat: 0.99 mg/dL (ref 0.50–1.10)
Glucose, Bld: 85 mg/dL (ref 70–99)
Potassium: 4.6 mmol/L (ref 3.5–5.3)
Sodium: 138 mmol/L (ref 135–146)
Total Bilirubin: 0.4 mg/dL (ref 0.2–1.2)
Total Protein: 6.5 g/dL (ref 6.1–8.1)

## 2017-03-26 NOTE — Assessment & Plan Note (Signed)
Doing well on Trintellix , no change to dose

## 2017-03-26 NOTE — Telephone Encounter (Signed)
Called patient to review benefits for a recommended procedure. Unable to leave a message, due to patients voicemail box not being set up.

## 2017-03-26 NOTE — Telephone Encounter (Signed)
Attempted to contact patient a second time, again no answer.  Unable to leave a message, due to voicemail not being set up

## 2017-03-26 NOTE — Assessment & Plan Note (Signed)
Pressure is borderline normal today. She has had some elevated ones on the off. Advised to continue her medications. She is working on dietary changes she has gained about 10 pounds in the past 6 months due to eating out when she separated from her husband. We'll work on lifestyle changes and not change her medication today follow-up in 3 months.

## 2017-03-26 NOTE — Patient Instructions (Addendum)
F/U 3 months  Schedule your mammogram

## 2017-03-26 NOTE — Progress Notes (Signed)
   Subjective:    Patient ID: Mary Robles, female    DOB: [birth year], 41 y.o.   MRN: [id number]  Patient presents for CPE (is fasting)  Patient here for CPE she is followed by GYN to status post hysterectomy that was resected Valium for some pelvic pain. Planning for Ultrasound to r/o cyst , off antibiotics   Mammogram to be scheduled   Immunizations up-to-date Pneumonia- done 2016  Due for fasting labs including cholesterol  Medications reviewed Hypertension she is taking her blood pressure medications as prescribed no difficulties. Depression she is still taking Trintellix feels well on the medication, she is still separated from husband. Has son in college another adult son working both are doing well  No new conerns today    Review Of Systems:  GEN- denies fatigue, fever, weight loss,weakness, recent illness HEENT- denies eye drainage, change in vision, nasal discharge, CVS- denies chest pain, palpitations RESP- denies SOB, cough, wheeze ABD- denies N/V, change in stools, abd pain GU- denies dysuria, hematuria, dribbling, incontinence MSK- denies joint pain, muscle aches, injury Neuro- denies headache, dizziness, syncope, seizure activity       Objective:    BP (!) 138/92   Pulse 80   Temp 98.4 F (36.9 C) (Oral)   Resp 16   Ht 5\' 6"  (1.676 m)   Wt 195 lb (88.5 kg)   LMP 04/17/2014   SpO2 99%   BMI 31.47 kg/m  GEN- NAD, alert and oriented x3 HEENT- PERRL, EOMI, non injected sclera, pink conjunctiva, MMM, oropharynx clear Neck- Supple, no thyromegaly CVS- RRR, no murmur RESP-CTAB ABD-NABS,soft,NT,ND Psych- Normal affect and mood  EXT- No edema Pulses- Radial, DP- 2+        Assessment & Plan:      Problem List Items Addressed This Visit    Vitamin D deficiency   Relevant Orders   Vitamin D, 25-hydroxy   MDD (major depressive disorder)    Doing well on Trintellix , no change to dose      Hypertension    Pressure is borderline normal  today. She has had some elevated ones on the off. Advised to continue her medications. She is working on dietary changes she has gained about 10 pounds in the past 6 months due to eating out when she separated from her husband. We'll work on lifestyle changes and not change her medication today follow-up in 3 months.      Relevant Orders   Lipid panel   TSH    Other Visit Diagnoses    Routine general medical examination at a health care facility    -  Primary   CPE done, fasting labs obtained, schedule mammogram.    Relevant Orders   CBC with Differential/Platelet   Comprehensive metabolic panel   Lipid panel      Note: This dictation was prepared with Dragon dictation along with smaller phrase technology. Any transcriptional errors that result from this process are unintentional.

## 2017-03-27 LAB — VITAMIN D 25 HYDROXY (VIT D DEFICIENCY, FRACTURES): Vit D, 25-Hydroxy: 17 ng/mL — ABNORMAL LOW (ref 30–100)

## 2017-03-28 ENCOUNTER — Ambulatory Visit (INDEPENDENT_AMBULATORY_CARE_PROVIDER_SITE_OTHER): Payer: 59 | Admitting: Obstetrics and Gynecology

## 2017-03-28 ENCOUNTER — Ambulatory Visit (INDEPENDENT_AMBULATORY_CARE_PROVIDER_SITE_OTHER): Payer: 59

## 2017-03-28 ENCOUNTER — Encounter: Payer: Self-pay | Admitting: Obstetrics and Gynecology

## 2017-03-28 ENCOUNTER — Other Ambulatory Visit: Payer: Self-pay | Admitting: Obstetrics and Gynecology

## 2017-03-28 ENCOUNTER — Other Ambulatory Visit: Payer: Self-pay | Admitting: *Deleted

## 2017-03-28 ENCOUNTER — Other Ambulatory Visit: Payer: Self-pay

## 2017-03-28 VITALS — BP 112/62 | HR 72 | Resp 16 | Wt 195.0 lb

## 2017-03-28 DIAGNOSIS — N83201 Unspecified ovarian cyst, right side: Secondary | ICD-10-CM

## 2017-03-28 DIAGNOSIS — R1031 Right lower quadrant pain: Secondary | ICD-10-CM

## 2017-03-28 MED ORDER — VITAMIN D (ERGOCALCIFEROL) 1.25 MG (50000 UNIT) PO CAPS: 50000.0000 [IU] | ORAL_CAPSULE | ORAL | 1 refills | Status: DC

## 2017-03-28 NOTE — Progress Notes (Signed)
GYNECOLOGY  VISIT   HPI: 41 y.o.   Married  [place] American  female   [phone] with Patient's last menstrual period was 04/17/2014.   here for  Pelvic ultrasound.  Has right lower quadrant pain for about one month.  Goes away at night when she is sleeping.   GYNECOLOGIC HISTORY: Patient's last menstrual period was 04/17/2014. Contraception:  Tubal/TAH-Bil. Salpingectomy  Menopausal hormone therapy:  none Last mammogram:  12-01-15 Density B/Neg/BiRads1:TBC Last pap smear:   01-21-14 Neg:Neg HR HPV;07-07-09 Neg (Hx of dysplasia and cryotherapy to cervix in 2000)        OB History    Gravida Para Term Preterm AB Living   4 2     1 2    SAB TAB Ectopic Multiple Live Births     1               Patient Active Problem List   Diagnosis Date Noted  . MDD (major depressive disorder) 07/16/2016  . Asthma, mild 04/11/2015  . GAD (generalized anxiety disorder) 02/08/2015  . Hyperlipidemia 07/09/2014  . Insomnia 07/09/2014  . Other malaise and fatigue 07/09/2014  . Nasal congestion 07/09/2014  . Urinary problem 07/09/2014  . Lipoma of abdominal wall s/p excision 05/04/2014 05/19/2014  . Status post total abdominal hysterectomy 05/04/2014  . Hypertension   . Vitamin D deficiency     Past Medical History:  Diagnosis Date  . Anxiety   . Asthma    no symptoms in "years", no inhaler  . Depression   . Herpes simplex   . Hyperlysinemia ([place])   . Hypertension   . Sickle cell trait ([place])   . Vitamin D deficiency     Past Surgical History:  Procedure Laterality Date  . ABDOMINAL HYSTERECTOMY N/A 05/04/2014   Procedure: HYSTERECTOMY ABDOMINAL with BILATERAL SALPINGECTOMY ;  Surgeon: Everardo All Amundson de Berton Lan, MD;  Location: [facility];  Service: Gynecology;  Laterality: N/A;  . BREAST BIOPSY Right Jan. 2016   benign fibroadenoma  . FOOT SURGERY    . LAPAROTOMY N/A 05/04/2014   Procedure: REMOVAL OF ABD WALL MASS ;  Surgeon: Adin Hector, MD;  Location: [facility];  Service:  General;  Laterality: N/A;  . LIPOMA EXCISION N/A 05/04/2014   Procedure: EXCISION LIPOMA of abdominal wall;  Surgeon: Jamey Reas de Berton Lan, MD;  Location: [facility];  Service: Gynecology;  Laterality: N/A;  . TUBAL LIGATION      Current Outpatient Prescriptions  Medication Sig Dispense Refill  . benazepril (LOTENSIN) 20 MG tablet TAKE ONE TABLET BY MOUTH ONCE DAILY 90 tablet 1  . cloNIDine (CATAPRES) 0.1 MG tablet TAKE ONE TABLET BY MOUTH TWICE DAILY 180 tablet 1  . fluticasone (FLONASE) 50 MCG/ACT nasal spray Place 1 spray into the nose daily.    Marland Kitchen levocetirizine (XYZAL) 5 MG tablet TAKE ONE TABLET BY MOUTH EVERY  EVENING 30 tablet 3  . vortioxetine HBr (TRINTELLIX) 10 MG TABS Take 1 tablet (10 mg total) by mouth daily. 30 tablet 6   No current facility-administered medications for this visit.      ALLERGIES: Vicodin [hydrocodone-acetaminophen] and Betadine [povidone iodine]  Family History  Problem Relation Age of Onset  . Diabetes Mother   . Thyroid disease Mother   . Stroke Father   . Mental retardation Maternal Aunt   . Diabetes Maternal Grandmother     Social History   Social History  . Marital status: Married    Spouse name:  N/A  . Number of children: N/A  . Years of education: N/A   Occupational History  . Not on file.   Social History Main Topics  . Smoking status: Never Smoker  . Smokeless tobacco: Never Used  . Alcohol use 0.0 oz/week     Comment: 2-3 x/year  . Drug use: No  . Sexual activity: Yes    Partners: Male    Birth control/ protection: Surgical     Comment: BTSP 2005/TAH/BSO   Other Topics Concern  . Not on file   Social History Narrative  . No narrative on file    ROS:  Pertinent items are noted in HPI.  PHYSICAL EXAMINATION:    BP 112/62 (BP Location: Right Arm, Patient Position: Sitting, Cuff Size: Normal)   Pulse 72   Resp 16   Wt 195 lb (88.5 kg)   LMP 04/17/2014   BMI 31.47 kg/m     General appearance: alert,  cooperative and appears stated age   Pelvic ultrasound -  Uterus absent.  Right ovary with 20 x 11 mm solid area with peripheral and internal vascular flow.   Low RI and PI.  Normal left ovary.  No free fluid.  ASSESSMENT  Status post TAH/bilateral salpingectomy and abdominal wall lipoma excision.  Right ovarian cyst with solid component and abnormal blood flow.  No ascites.   PLAN  Discussion of ovarian cysts and potential types - cystadenoma, fibroma, endometrioma, dermoid, malignancy. Written information also given. Will check CA125 and CEA.  Understands she may need GYN ONC referral and potential surgical treatment.  Final plan to follow.  Questions invited and answered.    An After Visit Summary was printed and given to the patient.  __15____ minutes face to face time of which over 50% was spent in counseling.

## 2017-03-28 NOTE — Telephone Encounter (Signed)
Ultrasound appointment was completed on 03/28/17. Ok to close encounter

## 2017-03-28 NOTE — Patient Instructions (Signed)
Ovarian Cyst  An ovarian cyst is a fluid-filled sac that forms on an ovary. The ovaries are small organs that produce eggs in women. Various types of cysts can form on the ovaries. Some may cause symptoms and require treatment. Most ovarian cysts go away on their own, are not cancerous (are benign), and do not cause problems. Common types of ovarian cysts include:  Functional (follicle) cysts.  Occur during the menstrual cycle, and usually go away with the next menstrual cycle if you do not get pregnant.  Usually cause no symptoms.  Endometriomas.  Are cysts that form from the tissue that lines the uterus (endometrium).  Are sometimes called "chocolate cysts" because they become filled with blood that turns brown.  Can cause pain in the lower abdomen during intercourse and during your period.  Cystadenoma cysts.  Develop from cells on the outside surface of the ovary.  Can get very large and cause lower abdomen pain and pain with intercourse.  Can cause severe pain if they twist or break open (rupture).  Dermoid cysts.  Are sometimes found in both ovaries.  May contain different kinds of body tissue, such as skin, teeth, hair, or cartilage.  Usually do not cause symptoms unless they get very big.  Theca lutein cysts.  Occur when too much of a certain hormone (human chorionic gonadotropin) is produced and overstimulates the ovaries to produce an egg.  Are most common after having procedures used to assist with the conception of a baby (in vitro fertilization). What are the causes? Ovarian cysts may be caused by:  Ovarian hyperstimulation syndrome. This is a condition that can develop from taking fertility medicines. It causes multiple large ovarian cysts to form.  Polycystic ovarian syndrome (PCOS). This is a common hormonal disorder that can cause ovarian cysts, as well as problems with your period or fertility. What increases the risk? The following factors may make you  more likely to develop ovarian cysts:  Being overweight or obese.  Taking fertility medicines.  Taking certain forms of hormonal birth control.  Smoking. What are the signs or symptoms? Many ovarian cysts do not cause symptoms. If symptoms are present, they may include:  Pelvic pain or pressure.  Pain in the lower abdomen.  Pain during sex.  Abdominal swelling.  Abnormal menstrual periods.  Increasing pain with menstrual periods. How is this diagnosed? These cysts are commonly found during a routine pelvic exam. You may have tests to find out more about the cyst, such as:  Ultrasound.  X-ray of the pelvis.  CT scan.  MRI.  Blood tests. How is this treated? Many ovarian cysts go away on their own without treatment. Your health care provider may want to check your cyst regularly for 2-3 months to see if it changes. If you are in menopause, it is especially important to have your cyst monitored closely because menopausal women have a higher rate of ovarian cancer. When treatment is needed, it may include:  Medicines to help relieve pain.  A procedure to drain the cyst (aspiration).  Surgery to remove the whole cyst.  Hormone treatment or birth control pills. These methods are sometimes used to help dissolve a cyst. Follow these instructions at home:  Take over-the-counter and prescription medicines only as told by your health care provider.  Do not drive or use heavy machinery while taking prescription pain medicine.  Get regular pelvic exams and Pap tests as often as told by your health care provider.  Return to your   normal activities as told by your health care provider. Ask your health care provider what activities are safe for you.  Do not use any products that contain nicotine or tobacco, such as cigarettes and e-cigarettes. If you need help quitting, ask your health care provider.  Keep all follow-up visits as told by your health care provider. This is  important. Contact a health care provider if:  Your periods are late, irregular, or painful, or they stop.  You have pelvic pain that does not go away.  You have pressure on your bladder or trouble emptying your bladder completely.  You have pain during sex.  You have any of the following in your abdomen:  A feeling of fullness.  Pressure.  Discomfort.  Pain that does not go away.  Swelling.  You feel generally ill.  You become constipated.  You lose your appetite.  You develop severe acne.  You start to have more body hair and facial hair.  You are gaining weight or losing weight without changing your exercise and eating habits.  You think you may be pregnant. Get help right away if:  You have abdominal pain that is severe or gets worse.  You cannot eat or drink without vomiting.  You suddenly develop a fever.  Your menstrual period is much heavier than usual. This information is not intended to replace advice given to you by your health care provider. Make sure you discuss any questions you have with your health care provider. Document Released: 12/03/2005 Document Revised: 06/22/2016 Document Reviewed: 05/06/2016 Elsevier Interactive Patient Education  2017 [employer]  

## 2017-03-29 ENCOUNTER — Telehealth: Payer: Self-pay | Admitting: Obstetrics and Gynecology

## 2017-03-29 LAB — CA 125: CA 125: 7 U/mL (ref <35)

## 2017-03-29 NOTE — Telephone Encounter (Signed)
Patient called for recent lab results from 03/28/17. She said, "Dr. Quincy Simmonds told me they may be ready for me today."

## 2017-03-29 NOTE — Telephone Encounter (Signed)
Dr. Quincy Simmonds, please review patients lab dated 4/12 and advise? CEA pending, CA 125 resulted.

## 2017-03-29 NOTE — Telephone Encounter (Signed)
Unable to leave message.  Voice mail is not set up.   I will send message through My Chart.

## 2017-03-30 LAB — CEA: CEA: 0.5 ng/mL

## 2017-04-01 ENCOUNTER — Telehealth: Payer: Self-pay | Admitting: Obstetrics and Gynecology

## 2017-04-01 NOTE — Telephone Encounter (Deleted)
-----   Message from Nunzio Cobbs, MD sent at 03/31/2017 11:25 AM EDT ----- Mary Robles,   I am sharing good news that your CEA is also normal.  I would like to have you see the Gynecologic Oncologist for consultation regarding the right ovarian cyst.  I will have the office put in this referral and try to get you there in a reasonable amount of time due to your persistent discomfort.  Please contact the office for any questions.   [name], MD  [place]

## 2017-04-01 NOTE — Telephone Encounter (Addendum)
See result note for instructions from Dr Quincy Simmonds regarding referral to Dr Denman George.  Message from Dr Denman George reviewed with Dr Quincy Simmonds. Per Dr Quincy Simmonds, call to patient and advise. Schedule office visit for consult.  Call to patient. Update provided. Appointment scheduled for Thursday 04-04-17 with Dr Quincy Simmonds.   Routing to Dr Quincy Simmonds for final review.

## 2017-04-01 NOTE — Telephone Encounter (Signed)
MyChart message viewed by [name] on 03/31/2017 11:27 AM.  Routing to provider for final review. Patient agreeable to disposition. Will close encounter.

## 2017-04-01 NOTE — Telephone Encounter (Signed)
Seth Bake, from the [facility] called and said, "Please tell Gay Filler that Dr. Denman George says it's okay for Dr. Quincy Simmonds to take care of the case for this patient. Her appointment at the [facility] has been cancelled. Please call back if you have any questions."

## 2017-04-03 NOTE — Progress Notes (Signed)
GYNECOLOGY  VISIT   HPI: 41 y.o.   Married  [place] American  female   [phone] with Patient's last menstrual period was 04/17/2014.   here to discuss surgery options.  Patient has persistent right lower quadrant pain, which has lessened somewhat. intercourse is painful on the right side and the RLQ pain can prevent her from sleeping.  She feels it into her back.  She had a pelvic ultrasound on 03/28/17 which showed: Uterus absent.  Right ovary with 20 x 11 mm solid area with peripheral and internal vascular flow.   Low RI and PI.  Normal left ovary.  No free fluid.  Labs done 03/28/17: CA125 - 7 CEA - 0.5  Dr. Denman George from [place] has reviewed the patient's ultrasound in EPIC, and has cleared the patient for care through a general gynecologist.  GYNECOLOGIC HISTORY: Patient's last menstrual period was 04/17/2014. Contraception:  Tubal/TAH-Bil.Salpingectomy Menopausal hormone therapy:  none Last mammogram:  12-01-15 Density B/Neg/BiRads1:TBC Last pap smear: 01-21-14 Neg:Neg HR HPV;07-07-09 Neg (Hx of dysplasia and cryotherapy to cervix in 2000)        OB History    Gravida Para Term Preterm AB Living   4 2     1 2    SAB TAB Ectopic Multiple Live Births     1               Patient Active Problem List   Diagnosis Date Noted  . MDD (major depressive disorder) 07/16/2016  . Asthma, mild 04/11/2015  . GAD (generalized anxiety disorder) 02/08/2015  . Hyperlipidemia 07/09/2014  . Insomnia 07/09/2014  . Other malaise and fatigue 07/09/2014  . Nasal congestion 07/09/2014  . Urinary problem 07/09/2014  . Lipoma of abdominal wall s/p excision 05/04/2014 05/19/2014  . Status post total abdominal hysterectomy 05/04/2014  . Hypertension   . Vitamin D deficiency     Past Medical History:  Diagnosis Date  . Anxiety   . Asthma    no symptoms in "years", no inhaler  . Depression   . Herpes simplex   . Hyperlysinemia ([place])   . Hypertension   . Sickle cell trait ([place])   . Vitamin  D deficiency     Past Surgical History:  Procedure Laterality Date  . ABDOMINAL HYSTERECTOMY N/A 05/04/2014   Procedure: HYSTERECTOMY ABDOMINAL with BILATERAL SALPINGECTOMY ;  Surgeon: Everardo All Amundson de Berton Lan, MD;  Location: [facility];  Service: Gynecology;  Laterality: N/A;  . BREAST BIOPSY Right Jan. 2016   benign fibroadenoma  . FOOT SURGERY    . LAPAROTOMY N/A 05/04/2014   Procedure: REMOVAL OF ABD WALL MASS ;  Surgeon: Adin Hector, MD;  Location: [facility];  Service: General;  Laterality: N/A;  . LIPOMA EXCISION N/A 05/04/2014   Procedure: EXCISION LIPOMA of abdominal wall;  Surgeon: Jamey Reas de Berton Lan, MD;  Location: [facility];  Service: Gynecology;  Laterality: N/A;  . TUBAL LIGATION      Current Outpatient Prescriptions  Medication Sig Dispense Refill  . benazepril (LOTENSIN) 20 MG tablet TAKE ONE TABLET BY MOUTH ONCE DAILY 90 tablet 1  . cloNIDine (CATAPRES) 0.1 MG tablet TAKE ONE TABLET BY MOUTH TWICE DAILY 180 tablet 1  . fluticasone (FLONASE) 50 MCG/ACT nasal spray Place 1 spray into the nose daily.    Marland Kitchen levocetirizine (XYZAL) 5 MG tablet TAKE ONE TABLET BY MOUTH EVERY  EVENING 30 tablet 3  . Vitamin D, Ergocalciferol, (DRISDOL) 50000 units CAPS capsule Take  1 capsule (50,000 Units total) by mouth every 7 (seven) days. Vit D 12 capsule 1  . vortioxetine HBr (TRINTELLIX) 10 MG TABS Take 1 tablet (10 mg total) by mouth daily. 30 tablet 6   No current facility-administered medications for this visit.      ALLERGIES: Vicodin [hydrocodone-acetaminophen] and Betadine [povidone iodine]  Family History  Problem Relation Age of Onset  . Diabetes Mother   . Thyroid disease Mother   . Stroke Father   . Mental retardation Maternal Aunt   . Diabetes Maternal Grandmother     Social History   Social History  . Marital status: Married    Spouse name: N/A  . Number of children: N/A  . Years of education: N/A   Occupational History  . Not on file.    Social History Main Topics  . Smoking status: Never Smoker  . Smokeless tobacco: Never Used  . Alcohol use 0.0 oz/week     Comment: 2-3 x/year  . Drug use: No  . Sexual activity: Yes    Partners: Male    Birth control/ protection: Surgical     Comment: BTSP 2005/TAH/BSO   Other Topics Concern  . Not on file   Social History Narrative  . No narrative on file    ROS:  Pertinent items are noted in HPI.  PHYSICAL EXAMINATION:    BP 122/84 (BP Location: Right Arm, Patient Position: Sitting, Cuff Size: Normal)   Pulse 76   Ht 5\' 6"  (1.676 m)   Wt 195 lb (88.5 kg)   LMP 04/17/2014   BMI 31.47 kg/m     General appearance: alert, cooperative and appears stated age Head: Normocephalic, without obvious abnormality, atraumatic Neck: no adenopathy, supple, symmetrical, trachea midline and thyroid normal to inspection and palpation Lungs: clear to auscultation bilaterally Heart: regular rate and rhythm Abdomen: Pfannenstiel incision, soft, non-tender, no masses,  no organomegaly Extremities: extremities normal, atraumatic, no cyanosis or edema Skin: Skin color, texture, turgor normal. No rashes or lesions Lymph nodes: Cervical, supraclavicular, and axillary nodes normal. No abnormal inguinal nodes palpated Neurologic: Grossly normal  Pelvic: External genitalia:  no lesions              Urethra:  normal appearing urethra with no masses, tenderness or lesions              Bartholins and Skenes: normal                 Vagina: normal appearing vagina with normal color and discharge, no lesions              Cervix:  Absent.                Bimanual Exam:  Uterus:   Absent              Adnexa: no mass, fullness, tenderness              Rectal exam: Yes.  .  Confirms.              Anus:  normal sphincter tone, no lesions  Chaperone was present for exam.  ASSESSMENT  RLQ pain.  Complex right ovarian cyst with increased blood flow.  Normal CA125 and CEA.  Dyspareunia.  Status  post TAH/excision of lipoma of the abdominal wall.   PLAN  I discussed the expected benign nature of the right ovarian cyst.  Options for care are follow up ultrasound in 5 weeks versus proceeding with laparoscopic right  oophorectomy and possible lysis of adhesions (with collection of pelvic washings).  Risks of laparoscopy include but are not limited to bleeding, infection, damage to surrounding organs, reaction to anesthesia, pneumonia, DVT, PE, death, need for further surgery, and possible laparotomy to complete the procedure. Surgical expectations and recovery discussed.  Will need 5 - 7 days out of work. Patient wishes to proceed.  We will get her mammogram up to date.  If there is any significant delay in proceeding with surgery, I would recommend a follow up ultrasound first.  Patient aware of this plan.  An After Visit Summary was printed and given to the patient.  __25____ minutes face to face time of which over 50% was spent in counseling.

## 2017-04-04 ENCOUNTER — Telehealth: Payer: Self-pay | Admitting: Obstetrics and Gynecology

## 2017-04-04 ENCOUNTER — Other Ambulatory Visit: Payer: Self-pay | Admitting: Obstetrics and Gynecology

## 2017-04-04 ENCOUNTER — Encounter: Payer: Self-pay | Admitting: Obstetrics and Gynecology

## 2017-04-04 ENCOUNTER — Ambulatory Visit (INDEPENDENT_AMBULATORY_CARE_PROVIDER_SITE_OTHER): Payer: 59 | Admitting: Obstetrics and Gynecology

## 2017-04-04 VITALS — BP 122/84 | HR 76 | Ht 66.0 in | Wt 195.0 lb

## 2017-04-04 DIAGNOSIS — N941 Unspecified dyspareunia: Secondary | ICD-10-CM | POA: Diagnosis not present

## 2017-04-04 DIAGNOSIS — N83201 Unspecified ovarian cyst, right side: Secondary | ICD-10-CM | POA: Diagnosis not present

## 2017-04-04 DIAGNOSIS — R1031 Right lower quadrant pain: Secondary | ICD-10-CM

## 2017-04-04 DIAGNOSIS — Z1231 Encounter for screening mammogram for malignant neoplasm of breast: Secondary | ICD-10-CM

## 2017-04-04 NOTE — Telephone Encounter (Signed)
Call to patient. Surgery date options discussed and patient desires to proceed with 04-15-17 date. Surgery scheduled for 04-15-17 at 1000 at [facility]. Surgery instruction sheet and bowel prep instructions reviewed with patient. And printed copy will be mailed to patient. Voiced understanding. Instructed to call PRN.  Routing to provider for final review. Patient agreeable to disposition. Will close encounter.

## 2017-04-04 NOTE — Progress Notes (Signed)
Patient scheduled while in office for screening MMG. Spoke with Laticia at [facility]. Patient scheduled for 04/18/17 at 7:20am, arriving at 7am. Patient is agreeable to date and time.

## 2017-04-04 NOTE — Telephone Encounter (Signed)
Spoke with patient regarding benefit for surgery. Patient understood and agreeable. Patient ready to schedule. Patient understands surgery cancellation policy. Patient aware this is professional benefit only. Patient aware will be contacted by hospital for separate benefits. Forwarding to nurse supervisor for scheduling.  Routing to [employer]  cc: Dr Quincy Simmonds

## 2017-04-04 NOTE — Patient Instructions (Signed)
Diagnostic Laparoscopy A diagnostic laparoscopy is a procedure to diagnose diseases in the abdomen. During the procedure, a thin, lighted, pencil-sized instrument called a laparoscope is inserted into the abdomen through an incision. The laparoscope allows your health care provider to look at the organs inside your body. Tell a health care provider about:  Any allergies you have.  All medicines you are taking, including vitamins, herbs, eye drops, creams, and over-the-counter medicines.  Any problems you or family members have had with anesthetic medicines.  Any blood disorders you have.  Any surgeries you have had.  Any medical conditions you have. What are the risks? Generally, this is a safe procedure. However, problems can occur, which may include:  Infection.  Bleeding.  Damage to other organs.  Allergic reaction to the anesthetics used during the procedure. What happens before the procedure?  Do not eat or drink anything after midnight on the night before the procedure or as directed by your health care provider.  Ask your health care provider about:  Changing or stopping your regular medicines.  Taking medicines such as aspirin and ibuprofen. These medicines can thin your blood. Do not take these medicines before your procedure if your health care provider instructs you not to.  Plan to have someone take you home after the procedure. What happens during the procedure?  You may be given a medicine to help you relax (sedative).  You will be given a medicine to make you sleep (general anesthetic).  Your abdomen will be inflated with a gas. This will make your organs easier to see.  Small incisions will be made in your abdomen.  A laparoscope and other small instruments will be inserted into the abdomen through the incisions.  A tissue sample may be removed from an organ in the abdomen for examination.  The instruments will be removed from the abdomen.  The gas  will be released.  The incisions will be closed with stitches (sutures). What happens after the procedure? Your blood pressure, heart rate, breathing rate, and blood oxygen level will be monitored often until the medicines you were given have worn off. This information is not intended to replace advice given to you by your health care provider. Make sure you discuss any questions you have with your health care provider. Document Released: 03/11/2001 Document Revised: 04/12/2016 Document Reviewed: 07/16/2014 Elsevier Interactive Patient Education  2017 Elsevier Inc.    Unilateral Salpingo-Oophorectomy Unilateral salpingo-oophorectomy is the surgical removal of one fallopian tube and ovary. The ovaries are small organs that produce eggs in women. The fallopian tubes transport the egg from the ovary to the womb (uterus). A unilateral salpingo-oophorectomy may be done for various reasons, including:  Infection in the fallopian tube and ovary.  Scar tissue in the fallopian tube and ovary (adhesions).  A cyst or tumor on the ovary.  A need to remove the fallopian tube and ovary when removing the uterus.  Cancer of the fallopian tube or ovary. The removal of one fallopian tube and ovary will not prevent you from becoming pregnant, put you into menopause, or cause problems with your menstrual periods or sex drive. Tell a health care provider about:  Any allergies you have.  All medicines you are taking, including vitamins, herbs, eye drops, creams, and over-the-counter medicines.  Previous problems you or family members have had with the use of anesthetics.  Any blood disorders you have.  Previous surgeries you have had.  Any medical conditions you have. What are the risks?  Generally, this is a safe procedure. However, as with any procedure, complications can occur. Possible complications include:  Injury to surrounding organs.  Bleeding.  Infection.  Blood clots in the legs or  lungs.  Problems related to anesthesia. What happens before the procedure?  Ask your health care provider about changing or stopping your regular medicines. You may need to stop taking certain medicines, such as aspirin or blood thinners, at least 1 week before the surgery.  Do not eat or drink anything for at least 8 hours before the surgery.  If you smoke, do not smoke for at least 2 weeks before the surgery.  Make plans to have someone drive you home after the procedure or after your hospital stay. Also arrange for someone to help you with activities during recovery. What happens during the procedure?  You will be given medicine to help you relax before the procedure (sedative). You will then be given medicine to make you sleep through the procedure (general anesthetic). These medicines will be given through an IV access tube that is put into one of your veins.  Once you are asleep, your lower abdomen will be shaved and cleaned. A thin, flexible tube (catheter) will be placed in your bladder.  The surgeon may use a laparoscopic, robotic, or open technique for this surgery:  In the laparoscopic technique, the surgery is done through two small cuts (incisions) in the abdomen. A thin, lighted tube with a tiny camera on the end (laparoscope) is inserted into one of the incisions. The tools needed for the procedure are put through the other incision.  A robotic technique may be chosen to perform complex surgery in a small space. In the robotic technique, small incisions are made. A camera and surgical instruments are passed through the incisions. Surgical instruments are controlled with the help of a robotic arm.  In the open technique, the surgery is done through one large incision in the abdomen.  Using any of these techniques, the surgeon will remove the fallopian tube and ovary. The blood vessels will be clamped and tied.  The surgeon will then use staples or stitches to close the  incision or incisions. What happens after the procedure?  You will be taken to a recovery area where your progress will be monitored for 1-3 hours. Your blood pressure, pulse, and temperature will be checked often. You will remain in the recovery area until you are stable and waking up.  If the laparoscopic technique was used, you may be allowed to go home after several hours. You may have some shoulder pain. This is normal and usually goes away in a day or two.  If the open technique was used, you will be admitted to the hospital for a couple of days.  You will be given pain medicine as necessary.  The IV tube and catheter will be removed before you are discharged. This information is not intended to replace advice given to you by your health care provider. Make sure you discuss any questions you have with your health care provider. Document Released: 09/30/2009 Document Revised: 11/02/2016 Document Reviewed: 05/27/2013 Elsevier Interactive Patient Education  2017 [employer].

## 2017-04-05 ENCOUNTER — Other Ambulatory Visit: Payer: Self-pay | Admitting: Family Medicine

## 2017-04-08 NOTE — Patient Instructions (Addendum)
Your procedure is scheduled on:  Monday, April 15, 2017  Enter through the [employer] [facility] at:  8:30 AM  Pick up the phone at the desk and dial [phone].  Call this number if you have problems the morning of surgery: [phone].  Remember: Do NOT eat food or drink after:  Midnight Sunday  Take these medicines the morning of surgery with a SIP OF WATER:  Benazepril, Clonidine  Stop ALL herbal medications at this time  Do NOT smoke the day of surgery.  Do NOT wear jewelry (body piercing), metal hair clips/bobby pins, make-up, or nail polish. Do NOT wear lotions, powders, or perfumes.  You may wear deodorant. Do NOT shave for 48 hours prior to surgery. Do NOT bring valuables to the hospital. Contacts, dentures, or bridgework may not be worn into surgery.  Have a responsible adult drive you home and stay with you for 24 hours after your procedure  Bring a copy of your healthcare power of attorney and living will documents.

## 2017-04-09 ENCOUNTER — Other Ambulatory Visit: Payer: Self-pay

## 2017-04-09 ENCOUNTER — Encounter (HOSPITAL_COMMUNITY)
Admission: RE | Admit: 2017-04-09 | Discharge: 2017-04-09 | Disposition: A | Discharge status: Home / Self Care | Payer: 59 | Source: Ambulatory Visit | Attending: Obstetrics and Gynecology | Admitting: Obstetrics and Gynecology

## 2017-04-09 ENCOUNTER — Encounter (HOSPITAL_COMMUNITY): Payer: Self-pay

## 2017-04-09 DIAGNOSIS — Z01810 Encounter for preprocedural cardiovascular examination: Secondary | ICD-10-CM | POA: Diagnosis not present

## 2017-04-09 DIAGNOSIS — Z01818 Encounter for other preprocedural examination: Secondary | ICD-10-CM | POA: Diagnosis present

## 2017-04-09 DIAGNOSIS — Z01812 Encounter for preprocedural laboratory examination: Secondary | ICD-10-CM | POA: Insufficient documentation

## 2017-04-09 DIAGNOSIS — R9431 Abnormal electrocardiogram [ECG] [EKG]: Secondary | ICD-10-CM | POA: Insufficient documentation

## 2017-04-09 LAB — BASIC METABOLIC PANEL
Anion gap: 6 (ref 5–15)
BUN: 9 mg/dL (ref 6–20)
CALCIUM: 9 mg/dL (ref 8.9–10.3)
CO2: 27 mmol/L (ref 22–32)
CREATININE: 0.94 mg/dL (ref 0.44–1.00)
Chloride: 103 mmol/L (ref 101–111)
GFR calc non Af Amer: >60 mL/min (ref >60)
Glucose, Bld: 92 mg/dL (ref 65–99)
Potassium: 4.1 mmol/L (ref 3.5–5.1)
SODIUM: 136 mmol/L (ref 135–145)

## 2017-04-09 LAB — CBC
HCT: 38.4 % (ref 36.0–46.0)
Hemoglobin: 13.6 g/dL (ref 12.0–15.0)
MCH: 28.1 pg (ref 26.0–34.0)
MCHC: 35.4 g/dL (ref 30.0–36.0)
MCV: 79.3 fL (ref 78.0–100.0)
PLATELETS: 288 10*3/uL (ref 150–400)
RBC: 4.84 MIL/uL (ref 3.87–5.11)
RDW: 14.9 % (ref 11.5–15.5)
WBC: 7.6 10*3/uL (ref 4.0–10.5)

## 2017-04-09 NOTE — Pre-Procedure Instructions (Signed)
Mary Robles states she is really stressed out about something that happened with her job this morning.  She took her blood pressure medications this morning.  She states it is not normally that high, she has a blood pressure cuff at home that she uses to check blood pressure.  I encouraged her to check it throughout the remainder of the day if still elevated to contact her primary care doctor to evaluate her, because a diastolic above 354 can cause her surgery to be cancelled.  She verbalized understanding. Discussed Mary Robles history and current vital signs with Dr. Royce Macadamia. He reviewed her EKG from today no new orders received at this time.

## 2017-04-10 ENCOUNTER — Telehealth: Payer: Self-pay | Admitting: Obstetrics and Gynecology

## 2017-04-10 NOTE — Telephone Encounter (Signed)
Phone call to Dr. Dwyane Luo, anesthesiology at [facility].  EKG reviewed and no change from previous.  Ok to proceed with surgery.

## 2017-04-14 NOTE — H&P (Signed)
All Notes   Progress Notes by Burnice Logan, RN at 04/04/2017 8:30 AM   Author: Burnice Logan, RN Author Type: Registered Nurse Filed: 04/04/2017 11:11 AM  Note Status: Signed Cosign: Cosign Not Required Encounter Date: 04/04/2017  Editor: Burnice Logan, RN (Registered Nurse)    Patient scheduled while in office for screening MMG. Spoke with Laticia at [facility]. Patient scheduled for 04/18/17 at 7:20am, arriving at 7am. Patient is agreeable to date and time.    Progress Notes by Nunzio Cobbs, MD at 04/04/2017 8:30 AM   Author: Nunzio Cobbs, MD Author Type: Physician Filed: 04/04/2017 11:11 AM  Note Status: Signed Cosign: Cosign Not Required Encounter Date: 04/04/2017  Editor: Nunzio Cobbs, MD (Physician)  Prior Versions: 1. Lowella Fairy, CMA (Certified Medical Assistant) at 04/04/2017 8:27 AM - Sign at close encounter    GYNECOLOGY  VISIT   HPI: 41 y.o.   Married  [place] American  female   [phone] with Patient's last menstrual period was 04/17/2014.   here to discuss surgery options.  Patient has persistent right lower quadrant pain, which has lessened somewhat. intercourse is painful on the right side and the RLQ pain can prevent her from sleeping.  She feels it into her back.  She had a pelvic ultrasound on 03/28/17 which showed: Uterus absent.  Right ovary with 20 x 11 mm solid area with peripheral and internal vascular flow.  Low RI and PI.  Normal left ovary.  No free fluid.  Labs done 03/28/17: CA125 - 7 CEA - 0.5  Dr. Denman George from [place] has reviewed the patient's ultrasound in EPIC, and has cleared the patient for care through a general gynecologist.  GYNECOLOGIC HISTORY: Patient's last menstrual period was 04/17/2014. Contraception:  Tubal/TAH-Bil.Salpingectomy Menopausal hormone therapy:  none Last mammogram: 12-01-15 Density B/Neg/BiRads1:TBC Last pap smear: 01-21-14 Neg:Neg HR HPV;07-07-09 Neg (Hx of dysplasia and  cryotherapy to cervix in 2000)                OB History    Gravida Para Term Preterm AB Living   4 2     1 2    SAB TAB Ectopic Multiple Live Births     1                   Patient Active Problem List   Diagnosis Date Noted  . MDD (major depressive disorder) 07/16/2016  . Asthma, mild 04/11/2015  . GAD (generalized anxiety disorder) 02/08/2015  . Hyperlipidemia 07/09/2014  . Insomnia 07/09/2014  . Other malaise and fatigue 07/09/2014  . Nasal congestion 07/09/2014  . Urinary problem 07/09/2014  . Lipoma of abdominal wall s/p excision 05/04/2014 05/19/2014  . Status post total abdominal hysterectomy 05/04/2014  . Hypertension   . Vitamin D deficiency         Past Medical History:  Diagnosis Date  . Anxiety   . Asthma    no symptoms in "years", no inhaler  . Depression   . Herpes simplex   . Hyperlysinemia ([place])   . Hypertension   . Sickle cell trait ([place])   . Vitamin D deficiency          Past Surgical History:  Procedure Laterality Date  . ABDOMINAL HYSTERECTOMY N/A 05/04/2014   Procedure: HYSTERECTOMY ABDOMINAL with BILATERAL SALPINGECTOMY ;  Surgeon: Everardo All Amundson de Berton Lan, MD;  Location: [facility];  Service: Gynecology;  Laterality: N/A;  .  BREAST BIOPSY Right Jan. 2016   benign fibroadenoma  . FOOT SURGERY    . LAPAROTOMY N/A 05/04/2014   Procedure: REMOVAL OF ABD WALL MASS ;  Surgeon: Adin Hector, MD;  Location: [facility];  Service: General;  Laterality: N/A;  . LIPOMA EXCISION N/A 05/04/2014   Procedure: EXCISION LIPOMA of abdominal wall;  Surgeon: Jamey Reas de Berton Lan, MD;  Location: [facility];  Service: Gynecology;  Laterality: N/A;  . TUBAL LIGATION            Current Outpatient Prescriptions  Medication Sig Dispense Refill  . benazepril (LOTENSIN) 20 MG tablet TAKE ONE TABLET BY MOUTH ONCE DAILY 90 tablet 1  . cloNIDine (CATAPRES) 0.1 MG tablet TAKE ONE TABLET BY MOUTH TWICE DAILY 180 tablet 1   . fluticasone (FLONASE) 50 MCG/ACT nasal spray Place 1 spray into the nose daily.    Marland Kitchen levocetirizine (XYZAL) 5 MG tablet TAKE ONE TABLET BY MOUTH EVERY  EVENING 30 tablet 3  . Vitamin D, Ergocalciferol, (DRISDOL) 50000 units CAPS capsule Take 1 capsule (50,000 Units total) by mouth every 7 (seven) days. Vit D 12 capsule 1  . vortioxetine HBr (TRINTELLIX) 10 MG TABS Take 1 tablet (10 mg total) by mouth daily. 30 tablet 6   No current facility-administered medications for this visit.      ALLERGIES: Vicodin [hydrocodone-acetaminophen] and Betadine [povidone iodine]       Family History  Problem Relation Age of Onset  . Diabetes Mother   . Thyroid disease Mother   . Stroke Father   . Mental retardation Maternal Aunt   . Diabetes Maternal Grandmother     Social History        Social History  . Marital status: Married    Spouse name: N/A  . Number of children: N/A  . Years of education: N/A      Occupational History  . Not on file.         Social History Main Topics  . Smoking status: Never Smoker  . Smokeless tobacco: Never Used  . Alcohol use 0.0 oz/week     Comment: 2-3 x/year  . Drug use: No  . Sexual activity: Yes    Partners: Male    Birth control/ protection: Surgical     Comment: BTSP 2005/TAH/BSO       Other Topics Concern  . Not on file      Social History Narrative  . No narrative on file    ROS:  Pertinent items are noted in HPI.  PHYSICAL EXAMINATION:    BP 122/84 (BP Location: Right Arm, Patient Position: Sitting, Cuff Size: Normal)   Pulse 76   Ht 5\' 6"  (1.676 m)   Wt 195 lb (88.5 kg)   LMP 04/17/2014   BMI 31.47 kg/m     General appearance: alert, cooperative and appears stated age Head: Normocephalic, without obvious abnormality, atraumatic Neck: no adenopathy, supple, symmetrical, trachea midline and thyroid normal to inspection and palpation Lungs: clear to auscultation bilaterally Heart: regular  rate and rhythm Abdomen: Pfannenstiel incision, soft, non-tender, no masses,  no organomegaly Extremities: extremities normal, atraumatic, no cyanosis or edema Skin: Skin color, texture, turgor normal. No rashes or lesions Lymph nodes: Cervical, supraclavicular, and axillary nodes normal. No abnormal inguinal nodes palpated Neurologic: Grossly normal  Pelvic: External genitalia:  no lesions              Urethra:  normal appearing urethra with no masses, tenderness or lesions  Bartholins and Skenes: normal                 Vagina: normal appearing vagina with normal color and discharge, no lesions              Cervix:  Absent.                Bimanual Exam:  Uterus:   Absent              Adnexa: no mass, fullness, tenderness              Rectal exam: Yes.  .  Confirms.              Anus:  normal sphincter tone, no lesions  Chaperone was present for exam.  ASSESSMENT RLQ pain.  Complex right ovarian cyst with increased blood flow.  Normal CA125 and CEA.  Dyspareunia.  Status post TAH/excision of lipoma of the abdominal wall.   PLAN  I discussed the expected benign nature of the right ovarian cyst.  Options for care are follow up ultrasound in 5 weeks versus proceeding with laparoscopic right oophorectomy and possible lysis of adhesions (with collection of pelvic washings).  Risks of laparoscopy include but are not limited to bleeding, infection, damage to surrounding organs, reaction to anesthesia, pneumonia, DVT, PE, death, need for further surgery, and possible laparotomy to complete the procedure. Surgical expectations and recovery discussed.  Will need 5 - 7 days out of work. Patient wishes to proceed.  We will get her mammogram up to date.  If there is any significant delay in proceeding with surgery, I would recommend a follow up ultrasound first.  Patient aware of this plan.  An After Visit Summary was printed and given to the patient.  __25____ minutes  face to face time of which over 50% was spent in counseling.

## 2017-04-15 ENCOUNTER — Encounter (HOSPITAL_COMMUNITY)
Admission: RE | Disposition: A | Discharge status: Home / Self Care | Payer: Self-pay | Source: Ambulatory Visit | Attending: Obstetrics and Gynecology

## 2017-04-15 ENCOUNTER — Encounter (HOSPITAL_COMMUNITY): Payer: Self-pay | Admitting: *Deleted

## 2017-04-15 ENCOUNTER — Ambulatory Visit (HOSPITAL_COMMUNITY): Payer: 59 | Admitting: Anesthesiology

## 2017-04-15 ENCOUNTER — Ambulatory Visit (HOSPITAL_COMMUNITY)
Admission: RE | Admit: 2017-04-15 | Discharge: 2017-04-15 | Disposition: A | Discharge status: Home / Self Care | Payer: 59 | Source: Ambulatory Visit | Attending: Obstetrics and Gynecology | Admitting: Obstetrics and Gynecology

## 2017-04-15 DIAGNOSIS — R1031 Right lower quadrant pain: Secondary | ICD-10-CM | POA: Diagnosis not present

## 2017-04-15 DIAGNOSIS — M7989 Other specified soft tissue disorders: Secondary | ICD-10-CM | POA: Diagnosis not present

## 2017-04-15 DIAGNOSIS — N941 Unspecified dyspareunia: Secondary | ICD-10-CM | POA: Diagnosis not present

## 2017-04-15 DIAGNOSIS — N83201 Unspecified ovarian cyst, right side: Secondary | ICD-10-CM | POA: Insufficient documentation

## 2017-04-15 DIAGNOSIS — Z7951 Long term (current) use of inhaled steroids: Secondary | ICD-10-CM | POA: Insufficient documentation

## 2017-04-15 DIAGNOSIS — R8569 Abnormal cytological findings in specimens from other digestive organs and abdominal cavity: Secondary | ICD-10-CM | POA: Diagnosis not present

## 2017-04-15 DIAGNOSIS — N8301 Follicular cyst of right ovary: Secondary | ICD-10-CM | POA: Diagnosis not present

## 2017-04-15 DIAGNOSIS — E559 Vitamin D deficiency, unspecified: Secondary | ICD-10-CM | POA: Insufficient documentation

## 2017-04-15 DIAGNOSIS — F329 Major depressive disorder, single episode, unspecified: Secondary | ICD-10-CM | POA: Diagnosis not present

## 2017-04-15 DIAGNOSIS — Z79899 Other long term (current) drug therapy: Secondary | ICD-10-CM | POA: Insufficient documentation

## 2017-04-15 DIAGNOSIS — K654 Sclerosing mesenteritis: Secondary | ICD-10-CM | POA: Diagnosis not present

## 2017-04-15 DIAGNOSIS — I10 Essential (primary) hypertension: Secondary | ICD-10-CM | POA: Insufficient documentation

## 2017-04-15 DIAGNOSIS — N736 Female pelvic peritoneal adhesions (postinfective): Secondary | ICD-10-CM | POA: Diagnosis not present

## 2017-04-15 SURGERY — OOPHORECTOMY, LAPAROSCOPIC
Anesthesia: General | Site: Abdomen | Laterality: Right

## 2017-04-15 MED ORDER — BUPIVACAINE HCL (PF) 0.25 % IJ SOLN
INTRAMUSCULAR | Status: DC | PRN
  Administered 2017-04-15: 10 mL

## 2017-04-15 MED ORDER — FENTANYL CITRATE (PF) 250 MCG/5ML IJ SOLN
INTRAMUSCULAR | Status: AC
  Filled 2017-04-15: qty 5

## 2017-04-15 MED ORDER — SCOPOLAMINE 1 MG/3DAYS TD PT72
MEDICATED_PATCH | TRANSDERMAL | Status: AC
  Administered 2017-04-15: 1.5 mg via TRANSDERMAL
  Filled 2017-04-15: qty 1

## 2017-04-15 MED ORDER — MIDAZOLAM HCL 2 MG/2ML IJ SOLN
INTRAMUSCULAR | Status: DC | PRN
  Administered 2017-04-15: 2 mg via INTRAVENOUS

## 2017-04-15 MED ORDER — LACTATED RINGERS IV SOLN: INTRAVENOUS | Status: DC

## 2017-04-15 MED ORDER — BUPIVACAINE HCL (PF) 0.25 % IJ SOLN
INTRAMUSCULAR | Status: AC
  Filled 2017-04-15: qty 30

## 2017-04-15 MED ORDER — ONDANSETRON HCL 4 MG/2ML IJ SOLN
INTRAMUSCULAR | Status: DC | PRN
  Administered 2017-04-15: 4 mg via INTRAVENOUS

## 2017-04-15 MED ORDER — KETOROLAC TROMETHAMINE 30 MG/ML IJ SOLN
INTRAMUSCULAR | Status: DC | PRN
  Administered 2017-04-15: 30 mg via INTRAVENOUS

## 2017-04-15 MED ORDER — ROCURONIUM BROMIDE 100 MG/10ML IV SOLN
INTRAVENOUS | Status: DC | PRN
  Administered 2017-04-15: 40 mg via INTRAVENOUS

## 2017-04-15 MED ORDER — DEXAMETHASONE SODIUM PHOSPHATE 4 MG/ML IJ SOLN
INTRAMUSCULAR | Status: DC | PRN
  Administered 2017-04-15: 4 mg via INTRAVENOUS

## 2017-04-15 MED ORDER — LACTATED RINGERS IR SOLN
Status: DC | PRN
  Administered 2017-04-15: 3000 mL

## 2017-04-15 MED ORDER — KETOROLAC TROMETHAMINE 30 MG/ML IJ SOLN
INTRAMUSCULAR | Status: AC
  Filled 2017-04-15: qty 1

## 2017-04-15 MED ORDER — LIDOCAINE HCL (CARDIAC) 20 MG/ML IV SOLN
INTRAVENOUS | Status: AC
  Filled 2017-04-15: qty 5

## 2017-04-15 MED ORDER — PROMETHAZINE HCL 25 MG/ML IJ SOLN: 6.2500 mg | INTRAMUSCULAR | Status: DC | PRN

## 2017-04-15 MED ORDER — NEOSTIGMINE METHYLSULFATE 10 MG/10ML IV SOLN
INTRAVENOUS | Status: DC | PRN
  Administered 2017-04-15: 3 mg via INTRAVENOUS

## 2017-04-15 MED ORDER — LACTATED RINGERS IV SOLN
INTRAVENOUS | Status: DC
  Administered 2017-04-15 (×2): via INTRAVENOUS

## 2017-04-15 MED ORDER — LIDOCAINE HCL (CARDIAC) 20 MG/ML IV SOLN
INTRAVENOUS | Status: DC | PRN
  Administered 2017-04-15: 80 mg via INTRAVENOUS

## 2017-04-15 MED ORDER — MEPERIDINE HCL 25 MG/ML IJ SOLN: 6.2500 mg | INTRAMUSCULAR | Status: DC | PRN

## 2017-04-15 MED ORDER — MIDAZOLAM HCL 2 MG/2ML IJ SOLN
INTRAMUSCULAR | Status: AC
  Filled 2017-04-15: qty 2

## 2017-04-15 MED ORDER — DEXAMETHASONE SODIUM PHOSPHATE 4 MG/ML IJ SOLN
INTRAMUSCULAR | Status: AC
  Filled 2017-04-15: qty 1

## 2017-04-15 MED ORDER — HYDROMORPHONE HCL 1 MG/ML IJ SOLN
INTRAMUSCULAR | Status: DC
Start: 2017-04-15 — End: 2017-04-15
  Filled 2017-04-15: qty 1

## 2017-04-15 MED ORDER — HYDROMORPHONE HCL 1 MG/ML IJ SOLN
0.2500 mg | INTRAMUSCULAR | Status: DC | PRN
  Administered 2017-04-15: 0.5 mg via INTRAVENOUS

## 2017-04-15 MED ORDER — GLYCOPYRROLATE 0.2 MG/ML IJ SOLN
INTRAMUSCULAR | Status: DC | PRN
  Administered 2017-04-15: 0.6 mg via INTRAVENOUS

## 2017-04-15 MED ORDER — SCOPOLAMINE 1 MG/3DAYS TD PT72
1.0000 | MEDICATED_PATCH | Freq: Once | TRANSDERMAL | Status: DC
  Administered 2017-04-15: 1.5 mg via TRANSDERMAL

## 2017-04-15 MED ORDER — IBUPROFEN 800 MG PO TABS: 800.0000 mg | ORAL_TABLET | Freq: Three times a day (TID) | ORAL | 0 refills | Status: DC | PRN

## 2017-04-15 MED ORDER — EPHEDRINE SULFATE 50 MG/ML IJ SOLN
INTRAMUSCULAR | Status: DC | PRN
  Administered 2017-04-15: 10 mg via INTRAVENOUS

## 2017-04-15 MED ORDER — OXYCODONE-ACETAMINOPHEN 5-325 MG PO TABS: 2.0000 | ORAL_TABLET | ORAL | 0 refills | Status: DC | PRN

## 2017-04-15 MED ORDER — ROCURONIUM BROMIDE 100 MG/10ML IV SOLN
INTRAVENOUS | Status: AC
  Filled 2017-04-15: qty 1

## 2017-04-15 MED ORDER — SUGAMMADEX SODIUM 200 MG/2ML IV SOLN
INTRAVENOUS | Status: AC
  Filled 2017-04-15: qty 2

## 2017-04-15 MED ORDER — PROPOFOL 10 MG/ML IV BOLUS
INTRAVENOUS | Status: AC
  Filled 2017-04-15: qty 20

## 2017-04-15 MED ORDER — CEFAZOLIN SODIUM-DEXTROSE 2-4 GM/100ML-% IV SOLN
2.0000 g | INTRAVENOUS | Status: AC
  Administered 2017-04-15: 2 g via INTRAVENOUS

## 2017-04-15 MED ORDER — ONDANSETRON HCL 4 MG/2ML IJ SOLN
INTRAMUSCULAR | Status: AC
  Filled 2017-04-15: qty 2

## 2017-04-15 MED ORDER — PROPOFOL 10 MG/ML IV BOLUS
INTRAVENOUS | Status: DC | PRN
  Administered 2017-04-15: 160 mg via INTRAVENOUS

## 2017-04-15 MED ORDER — FENTANYL CITRATE (PF) 100 MCG/2ML IJ SOLN
INTRAMUSCULAR | Status: DC | PRN
  Administered 2017-04-15 (×3): 50 ug via INTRAVENOUS

## 2017-04-15 SURGICAL SUPPLY — 37 items
ADH SKN CLS APL DERMABOND .7 (GAUZE/BANDAGES/DRESSINGS)
APL SKNCLS STERI-STRIP NONHPOA (GAUZE/BANDAGES/DRESSINGS)
BAG SPEC RTRVL LRG 6X4 10 (ENDOMECHANICALS)
BARRIER ADHS 3X4 INTERCEED (GAUZE/BANDAGES/DRESSINGS) IMPLANT
BENZOIN TINCTURE PRP APPL 2/3 (GAUZE/BANDAGES/DRESSINGS) IMPLANT
BRR ADH 4X3 ABS CNTRL BYND (GAUZE/BANDAGES/DRESSINGS)
CABLE HIGH FREQUENCY MONO STRZ (ELECTRODE) ×1 IMPLANT
CANISTER SUCT 3000ML PPV (MISCELLANEOUS) ×2 IMPLANT
CLOTH BEACON ORANGE TIMEOUT ST (SAFETY) ×2 IMPLANT
DERMABOND ADVANCED (GAUZE/BANDAGES/DRESSINGS)
DERMABOND ADVANCED .7 DNX12 (GAUZE/BANDAGES/DRESSINGS) IMPLANT
DRSG OPSITE POSTOP 3X4 (GAUZE/BANDAGES/DRESSINGS) ×1 IMPLANT
FORCEPS CUTTING 33CM 5MM (CUTTING FORCEPS) IMPLANT
GLOVE BIO SURGEON STRL SZ 6.5 (GLOVE) ×4 IMPLANT
GLOVE BIOGEL PI IND STRL 7.0 (GLOVE) ×1 IMPLANT
GLOVE BIOGEL PI INDICATOR 7.0 (GLOVE) ×2
GOWN STRL REUS W/TWL LRG LVL3 (GOWN DISPOSABLE) ×4 IMPLANT
NS IRRIG 1000ML POUR BTL (IV SOLUTION) ×2 IMPLANT
PACK LAPAROSCOPY BASIN (CUSTOM PROCEDURE TRAY) ×2 IMPLANT
PACK TRENDGUARD 450 HYBRID PRO (MISCELLANEOUS) IMPLANT
PACK TRENDGUARD 600 HYBRD PROC (MISCELLANEOUS) IMPLANT
POUCH SPECIMEN RETRIEVAL 10MM (ENDOMECHANICALS) IMPLANT
PROTECTOR NERVE ULNAR (MISCELLANEOUS) ×4 IMPLANT
SCISSORS LAP 5X35 DISP (ENDOMECHANICALS) ×1 IMPLANT
SET IRRIG TUBING LAPAROSCOPIC (IRRIGATION / IRRIGATOR) ×2 IMPLANT
SLEEVE XCEL OPT CAN 5 100 (ENDOMECHANICALS) ×2 IMPLANT
SOLUTION ELECTROLUBE (MISCELLANEOUS) IMPLANT
STRIP CLOSURE SKIN 1/4X3 (GAUZE/BANDAGES/DRESSINGS) IMPLANT
SUT VIC AB 2-0 UR6 27 (SUTURE) ×2 IMPLANT
SUT VICRYL 4-0 PS2 18IN ABS (SUTURE) ×2 IMPLANT
TOWEL OR 17X24 6PK STRL BLUE (TOWEL DISPOSABLE) ×4 IMPLANT
TRAY FOLEY CATH SILVER 14FR (SET/KITS/TRAYS/PACK) ×2 IMPLANT
TRENDGUARD 450 HYBRID PRO PACK (MISCELLANEOUS) ×2
TRENDGUARD 600 HYBRID PROC PK (MISCELLANEOUS)
TROCAR XCEL DIL TIP R 11M (ENDOMECHANICALS) ×2 IMPLANT
TROCAR XCEL NON-BLD 5MMX100MML (ENDOMECHANICALS) IMPLANT
WARMER LAPAROSCOPE (MISCELLANEOUS) ×2 IMPLANT

## 2017-04-15 NOTE — Progress Notes (Signed)
Update to History and Physical  No marked change in status since office preop visit.  Elevated BP today.  Took antiHTN meds this am.   OK to proceed with surgery.

## 2017-04-15 NOTE — Anesthesia Procedure Notes (Addendum)
Procedure Name: Intubation Date/Time: 04/15/2017 10:53 AM Performed by: Raenette Rover Pre-anesthesia Checklist: Patient identified, Patient being monitored, Emergency Drugs available and Suction available Patient Re-evaluated:Patient Re-evaluated prior to inductionOxygen Delivery Method: Circle system utilized Preoxygenation: Pre-oxygenation with 100% oxygen Intubation Type: IV induction Ventilation: Mask ventilation without difficulty Laryngoscope Size: Mac and 3 Grade View: Grade I Tube type: Oral Tube size: 7.0 mm Number of attempts: 1 Airway Equipment and Method: Stylet Placement Confirmation: ETT inserted through vocal cords under direct vision,  positive ETCO2,  breath sounds checked- equal and bilateral and CO2 detector Secured at: 22 cm Tube secured with: Tape Dental Injury: Teeth and Oropharynx as per pre-operative assessment

## 2017-04-15 NOTE — Anesthesia Preprocedure Evaluation (Addendum)
Anesthesia Evaluation  Patient identified by MRN, date of birth, ID band Patient awake    Reviewed: Allergy & Precautions, NPO status , Patient's Chart, lab work & pertinent test results  Airway Mallampati: I       Dental  (+) Teeth Intact, Dental Advisory Given   Pulmonary asthma ,    breath sounds clear to auscultation       Cardiovascular hypertension,  Rhythm:Regular Rate:Normal     Neuro/Psych PSYCHIATRIC DISORDERS Anxiety Depression negative neurological ROS     GI/Hepatic negative GI ROS, Neg liver ROS,   Endo/Other  negative endocrine ROS  Renal/GU negative Renal ROS  negative genitourinary   Musculoskeletal negative musculoskeletal ROS (+)   Abdominal   Peds negative pediatric ROS (+)  Hematology  (+) Sickle cell trait ,   Anesthesia Other Findings   Reproductive/Obstetrics negative OB ROS                            Lab Results  Component Value Date   WBC 7.6 04/09/2017   HGB 13.6 04/09/2017   HCT 38.4 04/09/2017   MCV 79.3 04/09/2017   PLT 288 04/09/2017   Lab Results  Component Value Date   CREATININE 0.94 04/09/2017   BUN 9 04/09/2017   NA 136 04/09/2017   K 4.1 04/09/2017   CL 103 04/09/2017   CO2 27 04/09/2017   Lab Results  Component Value Date   INR 1.04 07/01/2013   03/2017 EKG: normal sinus rhythm.  Anesthesia Physical Anesthesia Plan  ASA: II  Anesthesia Plan: General   Post-op Pain Management:    Induction: Intravenous  Airway Management Planned: Oral ETT  Additional Equipment:   Intra-op Plan:   Post-operative Plan: Extubation in OR  Informed Consent: I have reviewed the patients History and Physical, chart, labs and discussed the procedure including the risks, benefits and alternatives for the proposed anesthesia with the patient or authorized representative who has indicated his/her understanding and acceptance.   Dental advisory  given  Plan Discussed with: CRNA  Anesthesia Plan Comments:         Anesthesia Quick Evaluation

## 2017-04-15 NOTE — Transfer of Care (Signed)
Immediate Anesthesia Transfer of Care Note  Patient: Mary Robles  Procedure(s) Performed: Procedure(s) with comments: LAPAROSCOPIC OOPHORECTOMY,LYSIS OF ADHESIONS,EXCISION OF EPICLOIC MASS (Right) - pelvic washings and lysis of adhesions   Patient Location: PACU  Anesthesia Type:General  Level of Consciousness: awake, alert , oriented, drowsy and patient cooperative  Airway & Oxygen Therapy: Patient Spontanous Breathing and Patient connected to nasal cannula oxygen  Post-op Assessment: Report given to RN and Post -op Vital signs reviewed and stable  Post vital signs: Reviewed and stable  Last Vitals:  Vitals:   04/15/17 0844  BP: (!) 151/99  Pulse: 80  Resp: 16  Temp: 36.5 C    Last Pain:  Vitals:   04/15/17 0844  TempSrc: Oral  PainSc: 3       Patients Stated Pain Goal: 4 (39/68/86 [id number])  Complications: No apparent anesthesia complications

## 2017-04-15 NOTE — Anesthesia Postprocedure Evaluation (Addendum)
Anesthesia Post Note  Patient: Mary Robles  Procedure(s) Performed: Procedure(s) (LRB): LAPAROSCOPIC OOPHORECTOMY,LYSIS OF ADHESIONS,EXCISION OF EPICLOIC MASS (Right)  Patient location during evaluation: PACU Anesthesia Type: General Level of consciousness: awake and alert Pain management: pain level controlled Vital Signs Assessment: post-procedure vital signs reviewed and stable Respiratory status: spontaneous breathing, nonlabored ventilation, respiratory function stable and patient connected to nasal cannula oxygen Cardiovascular status: blood pressure returned to baseline and stable Postop Assessment: no signs of nausea or vomiting Anesthetic complications: no        Last Vitals:  Vitals:   04/15/17 1415 04/15/17 1445  BP:  (!) 143/90  Pulse:  77  Resp:    Temp: 36.4 C 36.6 C    Last Pain:  Vitals:   04/15/17 1445  TempSrc:   PainSc: 0-No pain   Pain Goal: Patients Stated Pain Goal: 4 (04/15/17 1415)               Effie Berkshire

## 2017-04-15 NOTE — Brief Op Note (Signed)
04/15/2017  12:36 PM  PATIENT:  [name]  41 y.o. female  PRE-OPERATIVE DIAGNOSIS:  right ovarian cyst  POST-OPERATIVE DIAGNOSIS:  right ovarian cyst  PROCEDURE:  Procedure(s) with comments: LAPAROSCOPIC OOPHORECTOMY,LYSIS OF ADHESIONS,EXCISION OF EPICLOIC MASS (Right) - pelvic washings and lysis of adhesions   SURGEON:  Surgeon(s) and Role:    * Brook E Yisroel Ramming, MD - Primary    * Megan Salon, MD - Assisting  PHYSICIAN ASSISTANT:  NA  ASSISTANTS: Megan Salon, MD   ANESTHESIA:   local and general  EBL:  Total I/O In: 1200 [I.V.:1200] Out: 315 [Urine:300; Blood:15]  BLOOD ADMINISTERED:none  DRAINS: none   LOCAL MEDICATIONS USED:  MARCAINE     SPECIMEN:  Source of Specimen:  Pelvic washings, right ovary, epipolic mass  DISPOSITION OF SPECIMEN:  PATHOLOGY  COUNTS:  YES  TOURNIQUET:  * No tourniquets in log *  DICTATION: .Other Dictation: Dictation Number    PLAN OF CARE: Discharge to home after PACU  PATIENT DISPOSITION:  PACU - hemodynamically stable.   Delay start of Pharmacological VTE agent (>24hrs) due to surgical blood loss or risk of bleeding: not applicable

## 2017-04-15 NOTE — Discharge Instructions (Addendum)
Post Anesthesia Home Care Instructions  Activity: Get plenty of rest for the remainder of the day. A responsible individual must stay with you for 24 hours following the procedure.  For the next 24 hours, DO NOT: -Drive a car -Paediatric nurse -Drink alcoholic beverages -Take any medication unless instructed by your physician -Make any legal decisions or sign important papers.  Meals: Start with liquid foods such as gelatin or soup. Progress to regular foods as tolerated. Avoid greasy, spicy, heavy foods. If nausea and/or vomiting occur, drink only clear liquids until the nausea and/or vomiting subsides. Call your physician if vomiting continues.  Special Instructions/Symptoms: Your throat may feel dry or sore from the anesthesia or the breathing tube placed in your throat during surgery. If this causes discomfort, gargle with warm salt water. The discomfort should disappear within 24 hours.  If you had a scopolamine patch placed behind your ear for the management of post- operative nausea and/or vomiting:  1. The medication in the patch is effective for 72 hours, after which it should be removed.  Wrap patch in a tissue and discard in the trash. Wash hands thoroughly with soap and water. 2. You may remove the patch earlier than 72 hours if you experience unpleasant side effects which may include dry mouth, dizziness or visual disturbances. 3. Avoid touching the patch. Wash your hands with soap and water after contact with the patch.     Hi Mary Robles,   I removed your right ovary.  You also had some scar tissue on the underside of the vagina inside the body cavity that I removed.  You had an area of fatty tissue on the outside of the large intestine that was adherent to the underside of the vagina, and I removed this fatty tissue also.   You did well today!  [name], MD  Diagnostic Laparoscopy, Care After Refer to this sheet in the next few weeks. These instructions provide you  with information about caring for yourself after your procedure. Your health care provider may also give you more specific instructions. Your treatment has been planned according to current medical practices, but problems sometimes occur. Call your health care provider if you have any problems or questions after your procedure. What can I expect after the procedure? After your procedure, it is common to have mild discomfort in the throat and abdomen. Follow these instructions at home:  Take over-the-counter and prescription medicines only as told by your health care provider.  Do not drive for 24 hours if you received a sedative.  Return to your normal activities as told by your health care provider.  Do not take baths, swim, or use a hot tub until your health care provider approves. You may shower.  Follow instructions from your health care provider about how to take care of your incision. Make sure you:  Wash your hands with soap and water before you change your bandage (dressing). If soap and water are not available, use hand sanitizer.  Change your dressing as told by your health care provider.  Leave stitches (sutures), skin glue, or adhesive strips in place. These skin closures may need to stay in place for 2 weeks or longer. If adhesive strip edges start to loosen and curl up, you may trim the loose edges. Do not remove adhesive strips completely unless your health care provider tells you to do that.  Check your incision area every day for signs of infection. Check for:  More redness, swelling, or pain.  More fluid or blood.  Warmth.  Pus or a bad smell.  It is your responsibility to get the results of your procedure. Ask your health care provider or the department performing the procedure when your results will be ready. Contact a health care provider if:  There is new pain in your shoulders.  You feel light-headed or faint.  You are unable to Ammons gas or unable to have  a bowel movement.  You feel nauseous or you vomit.  You develop a rash.  You have more redness, swelling, or pain around your incision.  You have more fluid or blood coming from your incision.  Your incision feels warm to the touch.  You have pus or a bad smell coming from your incision.  You have a fever or chills. Get help right away if:  Your pain is getting worse.  You have ongoing vomiting.  The edges of your incision open up.  You have trouble breathing.  You have chest pain. This information is not intended to replace advice given to you by your health care provider. Make sure you discuss any questions you have with your health care provider. Document Released: 11/14/2015 Document Revised: 05/10/2016 Document Reviewed: 08/16/2015 Elsevier Interactive Patient Education  2017 [employer].

## 2017-04-16 NOTE — Op Note (Signed)
NAMELAVERGNE, HILTUNEN NO.:  [id number]  MEDICAL RECORD NO.:  [id number]  LOCATION:                                 FACILITY:  PHYSICIAN:  Lenard Galloway, M.D.        DATE OF BIRTH:  DATE OF PROCEDURE:  04/15/2017 DATE OF DISCHARGE:                              OPERATIVE REPORT   PREOPERATIVE DIAGNOSES: 1. Right lower quadrant pain. 2. Right ovarian cyst.  POSTOPERATIVE DIAGNOSES: 1. Right lower quadrant pain. 2. Right ovarian cyst. 3. Pelvic adhesion. 4. Epiploic mass.  PROCEDURES: 1. Laparoscopy with right oophorectomy. 2. Collection of pelvic washings. 3. Lysis of adhesions. 4. Excision of epiploic mass.  SURGEON:  Lenard Galloway, M.D.  ASSISTANT:  Lyman Speller, MD  ANESTHESIA:  General endotracheal.  IV FLUIDS:  1200 mL Ringer's lactate.  ESTIMATED BLOOD LOSS:  15 mL.  URINE OUTPUT:  300 mL.  COMPLICATIONS:  None.  INDICATIONS FOR THE PROCEDURE:  The patient is a 41 year old, para 2, African American female, status post total abdominal hysterectomy with bilateral salpingectomy in 2015, for uterine fibroids, and pelvic pain. The patient at that time had excision of a large abdominal wall lipoma. The patient presented with persistent right lower quadrant pain in April of this year and a pelvic ultrasound documented the right ovary with a 20 x 11 mm solid area with abnormal blood flow.  The patient had a normal left ovary.  She had a normal CA-125 and CEA.  Pelvic ultrasound images have been reviewed with GYN/Oncology, who has assessed the right ovary as low risk for ovarian malignancy.  The plan is now made to proceed with a laparoscopic right oophorectomy and collection of pelvic washings after risks, benefits, and alternatives are reviewed.  FINDINGS:  Examination under anesthesia reveals an absent uterus.  No adnexal masses are appreciated.  At the time of laparoscopy, the patient was noted to have a 4 cm right ovary that is  unremarkable in appearance. The left ovary contained follicular cysts.  The bilateral fallopian tubes and uterus were absent.  At the top of the vaginal cuff, there was an adhesion of the bowel of the large intestine and an epiploic mass of the large intestine to the top of the vagina.  The pelvis and abdomen were otherwise free of adhesive disease.  SPECIMENS:  The right ovary, the epiploic mass, and the pelvic washings were sent to Pathology separately.  DESCRIPTION OF PROCEDURE:  The patient was reidentified in the preoperative hold area.  She did receive Ancef for antibiotic prophylaxis and she received TED hose and PAS stockings for DVT prophylaxis.  In the operating room, the patient received general endotracheal anesthesia.  She was placed in the dorsal lithotomy position with Allen stirrups.  Her hands were tucked at her sides and the trend guard was used for stabilization on the operating room table.  The lower abdomen and vagina were sterilely prepped and a Foley catheter was placed inside the bladder.  A sponge stick was placed inside the vagina.  The procedure began by creating a 5 mm umbilical incision with a scalpel.  A Veress needle was placed inside the peritoneal cavity and  a saline drop test was performed.  The fluid flowed freely.  The CO2 pneumoperitoneum was achieved and the Veress needle was then removed.  A 5 mm Optiview trocar with 5 mm laparoscope were placed inside the peritoneal cavity and the laparoscope confirmed proper placement.  An 8-mm trocar was placed in the right lower quadrant under visualization of the laparoscope.  The skin was injected first with 0.25% Marcaine and the incision was created with a scalpel.  A 5-mm trocar was placed in the left lower quadrant again under visualization of the laparoscope and with similar technique.  The patient was placed in Trendelenburg position.  An inspection of the pelvic and abdominal organs was  performed and the findings are as noted above.  Pelvic washings were obtained and sent to Pathology.  Lysis of adhesions of the epiploic mass in the sigmoid colon to the vaginal cuff were performed using a combination of sharp and blunt dissection.  The epiploic mass was grasped and was then removed through the 8 mm right lower quadrant trocar.  This was sent to Pathology.  The ureter along the right pelvic sidewall was identified.  The LigaSure instrument was then used to cauterize and cut the infundibulopelvic ligament.  An EndoCatch bag was placed inside the peritoneal cavity and the right ovary was placed inside the EndoCatch bag and was then drawn up through the right lower quadrant incision.  The fascial and skin incisions needed to be extended in order to remove the ovary from the peritoneal cavity.  The ovary was removed in pieces within the EndoCatch bag.  This was sent to Pathology.  The pneumoperitoneum was reachieved and the operative sites were re- examined and found to be hemostatic.  The laparoscopic instruments were removed.  The right lower quadrant fascial incision was closed with a running suture of 0 Vicryl.  All skin incisions were then closed with subcuticular sutures of 4-0 Vicryl.  Dermabond was placed over the incisions.  The patient's Foley catheter and sponge stick in the vagina were removed.  She was awakened, extubated, and escorted to the recovery room in stable condition.  There were no complications to the procedure.  All needle, instrument, and lap counts were correct.     Lenard Galloway, M.D.     BES/MEDQ  D:  04/15/2017  T:  04/16/2017  Job:  [id number]

## 2017-04-17 ENCOUNTER — Telehealth: Payer: Self-pay | Admitting: Obstetrics and Gynecology

## 2017-04-17 NOTE — Telephone Encounter (Signed)
Patient had her rt ovary removed on Monday 04/15/17.  She wants to know when she can remove the bandage.

## 2017-04-17 NOTE — Telephone Encounter (Signed)
Can return to work on 04/22/17.  No lifting over 10 - 15 pounds until 05/20/17.

## 2017-04-17 NOTE — Telephone Encounter (Signed)
Spoke with patient. Patient states she has honeycomb dressing in place, when can this be removed? Patient reports dressing is sealed, advised to keep dressing in place for up to 5 days or if can remove if starts to peel off. Keep incision clean and dry, may use dial soap and water for cleaning. Patient reports doing well overall, pain controlled, no bleeding, some bloating but passing gas. Patient states she discussed with Dr. Quincy Simmonds returning to work after 5-7 days, is this still an option? Patient not scheduled for post op appointment until 5/14, advised will review with Dr. Quincy Simmonds and return call with recommendations, patient is agreeable.  Dr. Quincy Simmonds- please advise on returning to work, ok to move post-op appt up?

## 2017-04-17 NOTE — Telephone Encounter (Signed)
Spoke with patient to advise I received FMLA paperwork on 04/16/17. Advised patient the forms have not been processed, as of this time. 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 Patient states if she has a note that states she cant lift, she can return to work with that restriction.  I explained to patient I will forward this information to Dr Quincy Simmonds, so she can determine the appropriate return to work date. Patient understood and is agreeable.  Routing to Dr Quincy Simmonds

## 2017-04-17 NOTE — Telephone Encounter (Signed)
Reviewed with Dr. Quincy Simmonds -can return to work after 5-7 days if no heavy lifting required at work. If heavy lifting, will need to wait until post-op appointment.    Spoke with patient. 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 Patient states date on FMLA papers state what day she needs to return to work and is unsure what that day is. Placed patient on brief hold, Suzy unavailable. Advised patient would have Deloris Ping return call with FMLA paperwork information, patient is agreeable.    Deloris Ping -can you review FMLA details with patient?

## 2017-04-18 ENCOUNTER — Ambulatory Visit
Admission: RE | Admit: 2017-04-18 | Discharge: 2017-04-18 | Disposition: A | Discharge status: Home / Self Care | Payer: 59 | Source: Ambulatory Visit | Attending: Obstetrics and Gynecology | Admitting: Obstetrics and Gynecology

## 2017-04-18 DIAGNOSIS — Z1231 Encounter for screening mammogram for malignant neoplasm of breast: Secondary | ICD-10-CM

## 2017-04-19 NOTE — Telephone Encounter (Signed)
Call to patient to advise FMLA forms have been completed. Per patients request, forms have been faxed to her HR Department, to the attention of Rogelia Boga and confirmation has been received indicating transmittal was successful. Patient to pick up copies in office today.  Routing to Dr Quincy Simmonds

## 2017-04-19 NOTE — Telephone Encounter (Signed)
Thank you for assisting with the FMLA forms.

## 2017-04-22 ENCOUNTER — Ambulatory Visit: Payer: 59 | Admitting: Gynecologic Oncology

## 2017-04-29 ENCOUNTER — Ambulatory Visit (INDEPENDENT_AMBULATORY_CARE_PROVIDER_SITE_OTHER): Payer: 59 | Admitting: Obstetrics and Gynecology

## 2017-04-29 ENCOUNTER — Encounter: Payer: Self-pay | Admitting: Obstetrics and Gynecology

## 2017-04-29 VITALS — BP 122/76 | HR 80 | Ht 66.0 in | Wt 192.0 lb

## 2017-04-29 DIAGNOSIS — Z9889 Other specified postprocedural states: Secondary | ICD-10-CM

## 2017-04-29 DIAGNOSIS — N760 Acute vaginitis: Secondary | ICD-10-CM | POA: Diagnosis not present

## 2017-04-29 NOTE — Patient Instructions (Signed)
Call if you need anything! 

## 2017-04-29 NOTE — Progress Notes (Signed)
GYNECOLOGY  VISIT   HPI: 41 y.o.   Legally Separated  African American  female   [phone] with Patient's last menstrual period was 04/17/2014.   here for 2 week follow up  [place] (Right Abdomen). Pathology - benign right ovary and fatty necrosis of epiploica. No problems following surgery. Had pain with intercourse prior to surgery.   Some vaginal discharge.  Usually does OTC tx for yeast when this occurs. Has not done any treatment yet.   GYNECOLOGIC HISTORY: Patient's last menstrual period was 04/17/2014. Contraception: Tubal/TAH-Bil.Salpingectomy Menopausal hormone therapy: none Last mammogram: 04-18-17 Density B/Neg/BiRads1:TBC Last pap smear: : 01-21-14 Neg:Neg HR HPV;07-07-09 Neg (Hx of dysplasia and cryotherapy to cervix in 2000)        OB History    Gravida Para Term Preterm AB Living   4 2     1 2    SAB TAB Ectopic Multiple Live Births     1               Patient Active Problem List   Diagnosis Date Noted  . MDD (major depressive disorder) 07/16/2016  . Asthma, mild 04/11/2015  . GAD (generalized anxiety disorder) 02/08/2015  . Hyperlipidemia 07/09/2014  . Insomnia 07/09/2014  . Other malaise and fatigue 07/09/2014  . Nasal congestion 07/09/2014  . Urinary problem 07/09/2014  . Lipoma of abdominal wall s/p excision 05/04/2014 05/19/2014  . Status post total abdominal hysterectomy 05/04/2014  . Hypertension   . Vitamin D deficiency     Past Medical History:  Diagnosis Date  . Anxiety   . Asthma    no symptoms in "years", no inhaler  . Depression   . Herpes simplex   . Hyperlysinemia ([place])   . Hypertension   . Sickle cell trait ([place])   . Vitamin D deficiency     Past Surgical History:  Procedure Laterality Date  . ABDOMINAL HYSTERECTOMY N/A 05/04/2014   Procedure: HYSTERECTOMY ABDOMINAL with BILATERAL SALPINGECTOMY ;  Surgeon: Everardo All Amundson de Berton Lan, MD;  Location: [facility];   Service: Gynecology;  Laterality: N/A;  . BREAST BIOPSY Right Jan. 2016   benign fibroadenoma  . FOOT SURGERY    . LAPAROTOMY N/A 05/04/2014   Procedure: REMOVAL OF ABD WALL MASS ;  Surgeon: Adin Hector, MD;  Location: [facility];  Service: General;  Laterality: N/A;  . LIPOMA EXCISION N/A 05/04/2014   Procedure: EXCISION LIPOMA of abdominal wall;  Surgeon: Jamey Reas de Berton Lan, MD;  Location: [facility];  Service: Gynecology;  Laterality: N/A;  . TUBAL LIGATION      Current Outpatient Prescriptions  Medication Sig Dispense Refill  . benazepril (LOTENSIN) 20 MG tablet TAKE ONE TABLET BY MOUTH ONCE DAILY 90 tablet 1  . cloNIDine (CATAPRES) 0.1 MG tablet TAKE ONE TABLET BY MOUTH TWICE DAILY 180 tablet 1  . fluticasone (FLONASE) 50 MCG/ACT nasal spray Place 2 sprays into the nose daily as needed for allergies or rhinitis.     Marland Kitchen ibuprofen (ADVIL,MOTRIN) 800 MG tablet Take 1 tablet (800 mg total) by mouth every 8 (eight) hours as needed. 30 tablet 0  . levocetirizine (XYZAL) 5 MG tablet TAKE ONE TABLET BY MOUTH IN THE EVENING 30 tablet 3  . TRINTELLIX 10 MG TABS TAKE ONE TABLET BY MOUTH ONCE DAILY (Patient taking differently: TAKE ONE TABLET BY MOUTH BEFORE BEDTIME) 30 tablet 6  . Vitamin D, Ergocalciferol, (DRISDOL) 50000 units CAPS capsule Take 1  capsule (50,000 Units total) by mouth every 7 (seven) days. Vit D (Patient taking differently: Take 50,000 Units by mouth every 7 (seven) days. On Wednesdays) 12 capsule 1   No current facility-administered medications for this visit.      ALLERGIES: Vicodin [hydrocodone-acetaminophen] and Betadine [povidone iodine]  Family History  Problem Relation Age of Onset  . Diabetes Mother   . Thyroid disease Mother   . Stroke Father   . Mental retardation Maternal Aunt   . Diabetes Maternal Grandmother     Social History   Social History  . Marital status: Legally Separated    Spouse name: N/A  . Number of children: N/A  . Years of  education: N/A   Occupational History  . Not on file.   Social History Main Topics  . Smoking status: Never Smoker  . Smokeless tobacco: Never Used  . Alcohol use 0.0 oz/week     Comment: 2-3 x/year  . Drug use: No  . Sexual activity: Yes    Partners: Male    Birth control/ protection: Surgical     Comment: BTSP 2005/TAH/BSO   Other Topics Concern  . Not on file   Social History Narrative  . No narrative on file    ROS:  Pertinent items are noted in HPI.  PHYSICAL EXAMINATION:    BP 122/76 (BP Location: Right Arm, Patient Position: Sitting, Cuff Size: Normal)   Pulse 80   Ht 5\' 6"  (1.676 m)   Wt 192 lb (87.1 kg)   LMP 04/17/2014   BMI 30.99 kg/m     General appearance: alert, cooperative and appears stated age   Abdomen: incisions intact, soft, non-tender, no masses,  no organomegaly  Pelvic: External genitalia:  no lesions              Urethra:  normal appearing urethra with no masses, tenderness or lesions              Bartholins and Skenes: normal                 Vagina: no lesions, large amount of white creamy discharge, some odor.              Cervix: absent.                Bimanual Exam:  Uterus:  Absent.               Adnexa: no mass, fullness, tenderness         Chaperone was present for exam.  ASSESSMENT  Status post right oophorectomy and lysis of adhesions, removal of epiploica mass, collection of pelvic washings.  Vaginitis.  Possible bacterial vaginosis.   PLAN  Affirm done. Tx to follow.  Discussed surgical findings, procedure, and pathology report.  Avoid heavy lifting for 4 - 6 weeks post op.  Follow up for annual exam and prn.   Additional E and M for vaginitis performed today.    An After Visit Summary was printed and given to the patient.

## 2017-04-30 LAB — WET PREP BY MOLECULAR PROBE
CANDIDA SPECIES: NOT DETECTED
GARDNERELLA VAGINALIS: DETECTED — AB
TRICHOMONAS VAG: NOT DETECTED

## 2017-05-01 ENCOUNTER — Telehealth: Payer: Self-pay | Admitting: *Deleted

## 2017-05-01 NOTE — Telephone Encounter (Signed)
-----   Message from Nunzio Cobbs, MD sent at 05/01/2017  2:06 PM EDT ----- Please inform of Affirm result showing bacterial vaginosis. There is no yeast. She may treat with Flagyl 500 mg po bid for 7 days or Metrogel pv at hs for 5 nights.  Please send Rx to pharmacy of choice. ETOH precautions.

## 2017-05-01 NOTE — Telephone Encounter (Signed)
Unable to leave message, voicemail not set up.

## 2017-05-02 MED ORDER — METRONIDAZOLE 500 MG PO TABS: 500.0000 mg | ORAL_TABLET | Freq: Two times a day (BID) | ORAL | 0 refills | Status: DC

## 2017-05-02 NOTE — Telephone Encounter (Signed)
Spoke with patient, advised of results and recommendations as seen below per Dr. Quincy Simmonds. Rx for oral flagyl to verified pharmacy on file. ETOH precautions reviewed. Patient verbalizes understanding and is agreeable.   Routing to provider for final review. Patient is agreeable to disposition. Will close encounter.

## 2017-05-17 NOTE — Addendum Note (Signed)
Addendum  created 05/17/17 1245 by Effie Berkshire, MD   Sign clinical note

## 2017-06-12 ENCOUNTER — Ambulatory Visit: Payer: 59 | Admitting: Family Medicine

## 2017-06-25 ENCOUNTER — Ambulatory Visit: Payer: Self-pay | Admitting: Family Medicine

## 2017-07-02 ENCOUNTER — Encounter: Payer: Self-pay | Admitting: Family Medicine

## 2017-07-02 ENCOUNTER — Ambulatory Visit (INDEPENDENT_AMBULATORY_CARE_PROVIDER_SITE_OTHER): Payer: 59 | Admitting: Family Medicine

## 2017-07-02 VITALS — BP 130/74 | HR 76 | Temp 98.1°F | Resp 14 | Ht 66.0 in | Wt 189.0 lb

## 2017-07-02 DIAGNOSIS — R238 Other skin changes: Secondary | ICD-10-CM

## 2017-07-02 DIAGNOSIS — E669 Obesity, unspecified: Secondary | ICD-10-CM | POA: Diagnosis not present

## 2017-07-02 DIAGNOSIS — I10 Essential (primary) hypertension: Secondary | ICD-10-CM | POA: Diagnosis not present

## 2017-07-02 DIAGNOSIS — R233 Spontaneous ecchymoses: Secondary | ICD-10-CM

## 2017-07-02 LAB — CBC WITH DIFFERENTIAL/PLATELET
BASOS PCT: 0 %
Basophils Absolute: 0 cells/uL (ref 0–200)
EOS PCT: 3 %
Eosinophils Absolute: 201 cells/uL (ref 15–500)
HCT: 41.7 % (ref 35.0–45.0)
HEMOGLOBIN: 13.9 g/dL (ref 12.0–15.0)
LYMPHS ABS: 2412 {cells}/uL (ref 850–3900)
Lymphocytes Relative: 36 %
MCH: 28.1 pg (ref 27.0–33.0)
MCHC: 33.3 g/dL (ref 32.0–36.0)
MCV: 84.2 fL (ref 80.0–100.0)
MONO ABS: 469 {cells}/uL (ref 200–950)
MPV: 9.5 fL (ref 7.5–12.5)
Monocytes Relative: 7 %
NEUTROS ABS: 3618 {cells}/uL (ref 1500–7800)
NEUTROS PCT: 54 %
Platelets: 343 10*3/uL (ref 140–400)
RBC: 4.95 MIL/uL (ref 3.80–5.10)
RDW: 15.6 % — ABNORMAL HIGH (ref 11.0–15.0)
WBC: 6.7 10*3/uL (ref 3.8–10.8)

## 2017-07-02 NOTE — Patient Instructions (Addendum)
Vitamin D 2000IU daily when your prescription runs out  F/u 6 MONTHS

## 2017-07-02 NOTE — Assessment & Plan Note (Signed)
Blood pressure looks good today. She is also lost 5 pounds and continues to change her diet and become more active. Also get her less than 180 pounds. We'll follow-up in 6 months on her weight.

## 2017-07-02 NOTE — Progress Notes (Signed)
   Subjective:    Patient ID: Mary Robles, female    DOB: May 05, 1976, 41 y.o.   MRN: [id number]  Patient presents for Follow-up (is fasting) and Brusing (states that she has noted that she is bruising easily and cuts are taking longer to heal)  She had a follow-up chronic medical problems. Her blood pressure was borderline elevated the last visit we discussed dietary changes she had gained 10 pounds. She's lost 5 of those pounds since her visit in April. Her fasting labs at her physical in April due to mildly elevated LDL at 938 metabolic panel and CBC were essentially normal. She did have low vitamin D which she is on replacement for. Has been trying to make healthier   Bruising easily,noticed a scab a while to heal. It cut herself on something while she was moving some boxes her tetanus is up-to-date and it took a few weeks to heal she was concerned. She denies any blood in her stool denies any blood in her urine this is been present over the bruising since her GYN surgery.     Review Of Systems:  GEN- denies fatigue, fever, weight loss,weakness, recent illness HEENT- denies eye drainage, change in vision, nasal discharge, CVS- denies chest pain, palpitations RESP- denies SOB, cough, wheeze ABD- denies N/V, change in stools, abd pain GU- denies dysuria, hematuria, dribbling, incontinence MSK- denies joint pain, muscle aches, injury Neuro- denies headache, dizziness, syncope, seizure activity       Objective:    BP 130/74   Pulse 76   Temp 98.1 F (36.7 C) (Oral)   Resp 14   Ht 5\' 6"  (1.676 m)   Wt 189 lb (85.7 kg)   LMP 04/17/2014   SpO2 99%   BMI 30.51 kg/m  GEN- NAD, alert and oriented x3 HEENT- PERRL, EOMI, non injected sclera, pink conjunctiva, MMM, oropharynx clear Neck- Supple, no thyromegaly CVS- RRR, no murmur RESP-CTAB EXT- No edema Pulses- Radial, DP- 2+        Assessment & Plan:      Problem List Items Addressed This Visit    Obesity (BMI  30.0-34.9)   Hypertension    Blood pressure looks good today. She is also lost 5 pounds and continues to change her diet and become more active. Also get her less than 180 pounds. We'll follow-up in 6 months on her weight.       Other Visit Diagnoses    Easy bruising    -  Primary   Likely a transient effect. I will check her CBC again her platelets and a PT INR she did have surgical intervention. She is not on any blood thinners Her tetanus is up-to-date with regards to the old laceration which is now well healed. This was also during the time she was recuperating from her surgery so may just been taking her body longer to heal in general.    Relevant Orders   CBC with Differential/Platelet   Protime-INR      Note: This dictation was prepared with Dragon dictation along with smaller phrase technology. Any transcriptional errors that result from this process are unintentional.

## 2017-07-03 LAB — PROTIME-INR
INR: 1
PROTHROMBIN TIME: 10.4 s (ref 9.0–11.5)

## 2017-08-14 ENCOUNTER — Ambulatory Visit (HOSPITAL_COMMUNITY)
Admission: EM | Admit: 2017-08-14 | Discharge: 2017-08-14 | Disposition: A | Discharge status: Home / Self Care | Payer: 59 | Attending: Family Medicine | Admitting: Family Medicine

## 2017-08-14 ENCOUNTER — Other Ambulatory Visit: Payer: Self-pay | Admitting: Family Medicine

## 2017-08-14 ENCOUNTER — Encounter (HOSPITAL_COMMUNITY): Payer: Self-pay | Admitting: Family Medicine

## 2017-08-14 DIAGNOSIS — M79674 Pain in right toe(s): Secondary | ICD-10-CM | POA: Diagnosis not present

## 2017-08-14 DIAGNOSIS — S99921A Unspecified injury of right foot, initial encounter: Secondary | ICD-10-CM | POA: Diagnosis not present

## 2017-08-14 DIAGNOSIS — W2209XA Striking against other stationary object, initial encounter: Secondary | ICD-10-CM | POA: Diagnosis not present

## 2017-08-14 NOTE — Discharge Instructions (Signed)
You may use over the counter ibuprofen or acetaminophen as needed.  Wear the post op shoe until toe is feeling better.

## 2017-08-14 NOTE — ED Triage Notes (Addendum)
Pt here for right 3rd toe pain and swelling after hitting it last week on a fan. sts also LLE pain and burning. sts starts in the knee and radiates down.

## 2017-08-14 NOTE — ED Provider Notes (Signed)
  [facility]   [id number] 08/14/17 Arrival Time: 1157  ASSESSMENT & PLAN:  1. Toe injury, right, initial encounter    Likely broken. Post op shoe given to wear until toe begins to feel better. May f/u as needed. OTC ibuprofen with food.  Reviewed expectations re: course of current medical issues. Questions answered. Outlined signs and symptoms indicating need for more acute intervention. Patient verbalized understanding. After Visit Summary given.   SUBJECTIVE:  Mary Robles is a 41 y.o. female who presents with complaint of R third toe pain after kicking object last week. Describes persistent "ache". Worse after prolonged standing or walking. Able to wear her normal shoes. No sensation changes. Ibuprofen with mild help.  ROS: As per HPI.   OBJECTIVE:  Vitals:   08/14/17 1247  BP: (!) 161/98  Pulse: 73  Resp: 18  Temp: 98.4 F (36.9 C)  SpO2: 98%    General appearance: alert; no distress Extremities: R third toe with tenderness on palpation; no gross abnormalities; no metatarsal tenderness; able to bear weight without difficulty; sensation intact; normal capillary refill Skin: warm and dry Psychological: alert and cooperative; normal mood and affect   Allergies  Allergen Reactions  . Vicodin [Hydrocodone-Acetaminophen] Other (See Comments)    Hallucination  . Betadine [Povidone Iodine]     Reaction:Blisters    Past Medical History:  Diagnosis Date  . Anxiety   . Asthma    no symptoms in "years", no inhaler  . Depression   . Herpes simplex   . Hyperlysinemia ([place])   . Hypertension   . Sickle cell trait ([place])   . Vitamin D deficiency    Past Surgical History:  Procedure Laterality Date  . ABDOMINAL HYSTERECTOMY N/A 05/04/2014   Procedure: HYSTERECTOMY ABDOMINAL with BILATERAL SALPINGECTOMY ;  Surgeon: Everardo All Amundson de Berton Lan, MD;  Location: [facility];  Service: Gynecology;  Laterality: N/A;  . BREAST BIOPSY Right Jan. 2016   benign fibroadenoma  . FOOT SURGERY    . LAPAROTOMY N/A 05/04/2014   Procedure: REMOVAL OF ABD WALL MASS ;  Surgeon: Adin Hector, MD;  Location: [facility];  Service: General;  Laterality: N/A;  . LIPOMA EXCISION N/A 05/04/2014   Procedure: EXCISION LIPOMA of abdominal wall;  Surgeon: Jamey Reas de Berton Lan, MD;  Location: [facility];  Service: Gynecology;  Laterality: N/A;  . TUBAL LIGATION       Vanessa Kick, MD 08/14/17 1315

## 2017-09-13 ENCOUNTER — Other Ambulatory Visit: Payer: Self-pay | Admitting: Family Medicine

## 2017-10-01 ENCOUNTER — Encounter (HOSPITAL_COMMUNITY): Payer: Self-pay | Admitting: Emergency Medicine

## 2017-10-01 ENCOUNTER — Ambulatory Visit (HOSPITAL_COMMUNITY)
Admission: EM | Admit: 2017-10-01 | Discharge: 2017-10-01 | Disposition: A | Discharge status: Home / Self Care | Payer: 59 | Attending: Family Medicine | Admitting: Family Medicine

## 2017-10-01 ENCOUNTER — Other Ambulatory Visit: Payer: Self-pay | Admitting: Family Medicine

## 2017-10-01 DIAGNOSIS — R0602 Shortness of breath: Secondary | ICD-10-CM

## 2017-10-01 DIAGNOSIS — J069 Acute upper respiratory infection, unspecified: Secondary | ICD-10-CM

## 2017-10-01 DIAGNOSIS — I10 Essential (primary) hypertension: Secondary | ICD-10-CM | POA: Diagnosis not present

## 2017-10-01 DIAGNOSIS — R0981 Nasal congestion: Secondary | ICD-10-CM | POA: Diagnosis not present

## 2017-10-01 MED ORDER — ALBUTEROL SULFATE HFA 108 (90 BASE) MCG/ACT IN AERS: 2.0000 | INHALATION_SPRAY | Freq: Four times a day (QID) | RESPIRATORY_TRACT | 2 refills | Status: DC | PRN

## 2017-10-01 MED ORDER — MOMETASONE FUROATE 50 MCG/ACT NA SUSP: 2.0000 | Freq: Every day | NASAL | 6 refills | Status: DC

## 2017-10-01 NOTE — ED Triage Notes (Signed)
Pt sts some URI sx with congestion and SOB

## 2017-10-01 NOTE — Discharge Instructions (Signed)
Most likely a viral infection. Treat with albuterol if needed for shortness of breath in the setting of asthma. May use Delsym for cough. Tylenol for headaches, avoid NSAID's in the setting of high blood pressures. Please f/u with your PCP for ongoing elevated BP. If your chest pain worsens or becomes worsening SOB then please go to the ER. Feel better.

## 2017-10-01 NOTE — ED Provider Notes (Signed)
[place]    CSN: [id number] Arrival date & time: 10/01/17  1724     History   Chief Complaint Chief Complaint  Patient presents with  . URI    HPI Mary Robles is a 41 y.o. female.   Presents with sinus pressure, and mild dyspnea and some chest tightness. The sinus pressure started on Sunday and she notes associated malaise. No fever. She states that she had some intermittent chest pain last night that is "constant" but mild. It is not affected by exertion. She also notes some mild dyspnea that started this am when she was trying to speak in full sentences. It comes and goes. No diaphoresis or N, V. No past cardiac history. Noted history of uncontrolled HTN, currently reports taking her medications this am.       Past Medical History:  Diagnosis Date  . Anxiety   . Asthma    no symptoms in "years", no inhaler  . Depression   . Herpes simplex   . Hyperlysinemia ([place])   . Hypertension   . Sickle cell trait ([place])   . Vitamin D deficiency     Patient Active Problem List   Diagnosis Date Noted  . Obesity (BMI 30.0-34.9) 07/02/2017  . MDD (major depressive disorder) 07/16/2016  . Asthma, mild 04/11/2015  . GAD (generalized anxiety disorder) 02/08/2015  . Hyperlipidemia 07/09/2014  . Insomnia 07/09/2014  . Other malaise and fatigue 07/09/2014  . Nasal congestion 07/09/2014  . Urinary problem 07/09/2014  . Lipoma of abdominal wall s/p excision 05/04/2014 05/19/2014  . Status post total abdominal hysterectomy 05/04/2014  . Hypertension   . Vitamin D deficiency     Past Surgical History:  Procedure Laterality Date  . ABDOMINAL HYSTERECTOMY N/A 05/04/2014   Procedure: HYSTERECTOMY ABDOMINAL with BILATERAL SALPINGECTOMY ;  Surgeon: Everardo All Amundson de Berton Lan, MD;  Location: [facility];  Service: Gynecology;  Laterality: N/A;  . BREAST BIOPSY Right Jan. 2016   benign fibroadenoma  . FOOT SURGERY    . LAPAROTOMY N/A 05/04/2014   Procedure: REMOVAL  OF ABD WALL MASS ;  Surgeon: Adin Hector, MD;  Location: [facility];  Service: General;  Laterality: N/A;  . LIPOMA EXCISION N/A 05/04/2014   Procedure: EXCISION LIPOMA of abdominal wall;  Surgeon: Jamey Reas de Berton Lan, MD;  Location: [facility];  Service: Gynecology;  Laterality: N/A;  . TUBAL LIGATION      OB History    Gravida Para Term Preterm AB Living   4 2     1 2    SAB TAB Ectopic Multiple Live Births     1             Home Medications    Prior to Admission medications   Medication Sig Start Date End Date Taking? Authorizing Provider  albuterol (PROVENTIL HFA;VENTOLIN HFA) 108 (90 Base) MCG/ACT inhaler Inhale 2 puffs into the lungs every 6 (six) hours as needed for wheezing or shortness of breath. 10/01/17   Bjorn Pippin, PA-C  benazepril (LOTENSIN) 20 MG tablet TAKE 1 TABLET BY MOUTH ONCE DAILY 09/16/17   Alycia Rossetti, MD  cloNIDine (CATAPRES) 0.1 MG tablet TAKE ONE TABLET BY MOUTH TWICE DAILY 04/05/17   Alycia Rossetti, MD  fluticasone [facility]) 50 MCG/ACT nasal spray Place 2 sprays into the nose daily as needed for allergies or rhinitis.  01/22/17   [provider]  ibuprofen (ADVIL,MOTRIN) 800 MG tablet Take 1 tablet (800 mg  total) by mouth every 8 (eight) hours as needed. 04/15/17   Nunzio Cobbs, MD  levocetirizine (XYZAL) 5 MG tablet TAKE 1 TABLET BY MOUTH IN THE EVENING 08/14/17   Yreka, Modena Nunnery, MD  mometasone (NASONEX) 50 MCG/ACT nasal spray Place 2 sprays into the nose daily. 10/01/17   Lynne Takemoto G, PA-C  TRINTELLIX 10 MG TABS TAKE ONE TABLET BY MOUTH ONCE DAILY Patient taking differently: TAKE ONE TABLET BY MOUTH BEFORE BEDTIME 04/05/17   Alycia Rossetti, MD  Vitamin D, Ergocalciferol, (DRISDOL) 50000 units CAPS capsule Take 1 capsule (50,000 Units total) by mouth every 7 (seven) days. Vit D Patient taking differently: Take 50,000 Units by mouth every 7 (seven) days. On Wednesdays 03/28/17   Alycia Rossetti, MD     Family History Family History  Problem Relation Age of Onset  . Diabetes Mother   . Thyroid disease Mother   . Stroke Father   . Mental retardation Maternal Aunt   . Diabetes Maternal Grandmother     Social History Social History  Substance Use Topics  . Smoking status: Never Smoker  . Smokeless tobacco: Never Used  . Alcohol use 0.0 oz/week     Comment: 2-3 x/year     Allergies   Vicodin [hydrocodone-acetaminophen] and Betadine [povidone iodine]   Review of Systems Review of Systems  Constitutional: Positive for fatigue. Negative for fever.  HENT: Positive for sinus pain and sinus pressure.   Eyes: Negative.   Respiratory: Positive for cough, chest tightness and shortness of breath. Negative for wheezing and stridor.   Cardiovascular: Positive for chest pain.  Neurological: Negative.      Physical Exam Triage Vital Signs ED Triage Vitals [10/01/17 1749]  Enc Vitals Group     BP (!) 185/113     Pulse Rate 87     Resp 20     Temp 98.7 F (37.1 C)     Temp src      SpO2 100 %     Weight      Height      Head Circumference      Peak Flow      Pain Score 8     Pain Loc      Pain Edu?      Excl. in [place]?    No data found.   Updated Vital Signs BP (!) 185/113 (BP Location: Right Arm)   Pulse 87   Temp 98.7 F (37.1 C)   Resp 20   LMP 04/17/2014   SpO2 100%   Visual Acuity Right Eye Distance:   Left Eye Distance:   Bilateral Distance:    Right Eye Near:   Left Eye Near:    Bilateral Near:     Physical Exam  Constitutional: She appears well-developed and well-nourished. No distress.  HENT:  Head: Normocephalic and atraumatic.  Right Ear: External ear normal.  Left Ear: External ear normal.  Mouth/Throat: Oropharynx is clear and moist. No oropharyngeal exudate.  Neck: Normal range of motion.  Cardiovascular: Normal rate and regular rhythm.   Pulmonary/Chest: Effort normal.  Few mild expiratory wheezes, no crackles or rales   Lymphadenopathy:    She has no cervical adenopathy.  Skin: Skin is warm and dry. No rash noted. She is not diaphoretic.  Psychiatric: Her behavior is normal.  Nursing note and vitals reviewed.    UC Treatments / Results  Labs (all labs ordered are listed, but only abnormal results are displayed) Labs Reviewed -  No data to display  EKG  EKG Interpretation None       Radiology No results found.  Procedures .EKG Date/Time: 10/01/2017 6:30 PM Performed by: Khushi Zupko G Authorized by: Vanessa Kick   ECG reviewed by ED Physician in the absence of a cardiologist: yes   Previous ECG:    Previous ECG:  Compared to current   Comparison ECG info:  No change   Similarity:  No change Interpretation:    Interpretation: non-specific   Rate:    ECG rate assessment: normal   Rhythm:    Rhythm: sinus rhythm   Conduction:    Conduction: normal     (including critical care time)  Medications Ordered in UC Medications - No data to display   Initial Impression / Assessment and Plan / UC Course  I have reviewed the triage vital signs and the nursing notes.  Pertinent labs & imaging results that were available during my care of the patient were reviewed by me and considered in my medical decision making (see chart for details).    No evidence of a bacterial infection and abx not indicated at this time. Treat with supportive care. Avoid NSAID's and decongestants due to HTN.  Patient's EKG reviewed by Dr. Mannie Stabile and no change as compared to previous in April 2018. Suspect etiology is secondary to viral infection with underlying wheeze. Treat with nasal spray and albuterol. If symptoms become emergent with worsening chest pain or dyspnea then she is instructed to present to the ED for further evaluation.   Final Clinical Impressions(s) / UC Diagnoses   Final diagnoses:  Viral upper respiratory tract infection  Sinus congestion  Malignant hypertension  Shortness of breath     New Prescriptions Discharge Medication List as of 10/01/2017  6:30 PM    START taking these medications   Details  albuterol (PROVENTIL HFA;VENTOLIN HFA) 108 (90 Base) MCG/ACT inhaler Inhale 2 puffs into the lungs every 6 (six) hours as needed for wheezing or shortness of breath., Starting Tue 10/01/2017, Print    mometasone (NASONEX) 50 MCG/ACT nasal spray Place 2 sprays into the nose daily., Starting Tue 10/01/2017, Print         Controlled Substance Prescriptions [place] Controlled Substance Registry consulted? Not Applicable   Bjorn Pippin, PA-C 10/01/17 [id number]

## 2017-10-03 ENCOUNTER — Encounter: Payer: Self-pay | Admitting: Physician Assistant

## 2017-10-03 ENCOUNTER — Ambulatory Visit (INDEPENDENT_AMBULATORY_CARE_PROVIDER_SITE_OTHER): Payer: 59 | Admitting: Physician Assistant

## 2017-10-03 VITALS — BP 170/120 | HR 118 | Temp 98.6°F | Resp 18 | Wt 190.0 lb

## 2017-10-03 DIAGNOSIS — I10 Essential (primary) hypertension: Secondary | ICD-10-CM

## 2017-10-03 DIAGNOSIS — B9789 Other viral agents as the cause of diseases classified elsewhere: Secondary | ICD-10-CM

## 2017-10-03 DIAGNOSIS — J988 Other specified respiratory disorders: Secondary | ICD-10-CM

## 2017-10-03 MED ORDER — AMLODIPINE BESYLATE 5 MG PO TABS: 5.0000 mg | ORAL_TABLET | Freq: Every day | ORAL | 5 refills | Status: DC

## 2017-10-03 NOTE — Progress Notes (Signed)
Patient ID: Mary Robles MRN: [id number], DOB: 1976-03-07, 41 y.o. Date of Encounter: 10/03/2017, 4:01 PM    Chief Complaint:  Chief Complaint  Patient presents with  . Chest Pain  . Headache     HPI: 41 y.o. year old female presents with above.   The schedule noted that she had been seen at urgent care yesterday but was not feeling better so was coming in for visit here today.  I reviewed her notes from the urgent care dated 10/01/17.  That note documents the following in the history of present illness: "presents with sinus pressure, and mild dyspnea and some chest tightness. Sinus pressure started on Sunday and she notes associated malaise. No fever. She states that she had some intermittent chest pain last night that is constant but mild. It is not affected by exertion. No diaphoresis nausea or vomiting. No past cardiac history. Noted history of uncontrolled hypertension -- currently reports taking her medications this morning."  Exam at the urgent care was significant for blood pressure 185/113. Temp 98.7. Oxygen saturation 100%.  EKG was performed and was compared to prior EKG April 2018. Showed no new changes.  Was felt that there was no evidence of bacterial infection and antibiotics were not indicated. Felt that it was a viral infection. Nasal spray and use albuterol if needed. Prescribed Nasonex and albuterol.   Today: Today when I went in -- I addressed her blood pressure. She responds "it's only high when I'm sick".  States that she is not out of either her blood pressure medicines and that she is taking both her benazepril and clonidine.  She states that "nothing has changed since her urgent care visit ". Says that she slept most of the day yesterday. Says that she can feel drainage down her throat and into her upper chest.  "Feels like there is something there that if she could cough out would clear that feeling." States that she has not had to use the albuterol  inhaler. States that she has not been having any wheezing.     Home Meds:   Outpatient Medications Prior to Visit  Medication Sig Dispense Refill  . albuterol (PROVENTIL HFA;VENTOLIN HFA) 108 (90 Base) MCG/ACT inhaler Inhale 2 puffs into the lungs every 6 (six) hours as needed for wheezing or shortness of breath. 1 Inhaler 2  . benazepril (LOTENSIN) 20 MG tablet TAKE 1 TABLET BY MOUTH ONCE DAILY 90 tablet 1  . cloNIDine (CATAPRES) 0.1 MG tablet TAKE 1 TABLET BY MOUTH TWICE DAILY 180 tablet 1  . fluticasone (FLONASE) 50 MCG/ACT nasal spray Place 2 sprays into the nose daily as needed for allergies or rhinitis.     Marland Kitchen ibuprofen (ADVIL,MOTRIN) 800 MG tablet Take 1 tablet (800 mg total) by mouth every 8 (eight) hours as needed. 30 tablet 0  . levocetirizine (XYZAL) 5 MG tablet TAKE 1 TABLET BY MOUTH IN THE EVENING 90 tablet 3  . TRINTELLIX 10 MG TABS TAKE ONE TABLET BY MOUTH ONCE DAILY (Patient taking differently: TAKE ONE TABLET BY MOUTH BEFORE BEDTIME) 30 tablet 6  . Vitamin D, Ergocalciferol, (DRISDOL) 50000 units CAPS capsule Take 1 capsule (50,000 Units total) by mouth every 7 (seven) days. Vit D (Patient taking differently: Take 50,000 Units by mouth every 7 (seven) days. On Wednesdays) 12 capsule 1  . mometasone (NASONEX) 50 MCG/ACT nasal spray Place 2 sprays into the nose daily. 17 g 6   No facility-administered medications prior to visit.  Allergies:  Allergies  Allergen Reactions  . Vicodin [Hydrocodone-Acetaminophen] Other (See Comments)    Hallucination  . Betadine [Povidone Iodine]     Reaction:Blisters      Review of Systems: See HPI for pertinent ROS. All other ROS negative.    Physical Exam: Blood pressure (!) 170/120, pulse (!) 118, temperature 98.6 F (37 C), temperature source Oral, resp. rate 18, weight 86.2 kg (190 lb), last menstrual period 04/17/2014, SpO2 98 %., Body mass index is 30.67 kg/m. General:  WNWD AAF Appears in no acute distress.She is looking at  her cell phone throughout visit--a game on the screen. She did not cough at all during visit. She does not appear acutely ill.  HEENT: Normocephalic, atraumatic, eyes without discharge, sclera non-icteric, nares are without discharge. Bilateral auditory canals clear, TM's are without perforation, pearly grey and translucent with reflective cone of light bilaterally. Oral cavity moist, posterior pharynx without exudate, erythema, peritonsillar abscess. There is no tenderness with percussion to frontal or maxillary sinuses bilaterally.  Neck: Supple. No thyromegaly. No lymphadenopathy. Lungs: Clear bilaterally to auscultation without wheezes, rales, or rhonchi. Breathing is unlabored. Heart: Regular rhythm. No murmurs, rubs, or gallops. Msk:  Strength and tone normal for age. Extremities/Skin: Warm and dry.  Neuro: Alert and oriented X 3. Moves all extremities spontaneously. Gait is normal. CNII-XII grossly in tact. Psych:  Responds to questions appropriately with a normal affect.   I reviewed her EKG performed yesterday. It showed normal sinus rhythm. Poor anterior R-wave progression. However this was on EKG 04/09/17 as well.  ASSESSMENT AND PLAN:  41 y.o. year old female with  1. Essential hypertension - amLODipine (NORVASC) 5 MG tablet; Take 1 tablet (5 mg total) by mouth daily.  Dispense: 30 tablet; Refill: 5  2. Viral respiratory infection  Reassured her that exam today is revealing no signs of bacterial infection no signs that she is requiring antibiotics at this time. Discussed that I am most concerned about her blood pressure which is very elevated today and also yesterday at urgent care. She is to add Norvasc 5 mg daily in addition to her current medicines. Follow-up in 2 weeks to reassess blood pressure. Follow-up sooner if needed.   [address] [place], [place], [facility] 10/03/2017 4:01 PM

## 2017-12-05 ENCOUNTER — Other Ambulatory Visit: Payer: Self-pay | Admitting: Family Medicine

## 2017-12-05 NOTE — Telephone Encounter (Signed)
Medication refilled per protocol. 

## 2017-12-17 HISTORY — PX: NASAL SINUS SURGERY: SHX719

## 2017-12-23 ENCOUNTER — Encounter: Payer: Self-pay | Admitting: Obstetrics and Gynecology

## 2017-12-23 ENCOUNTER — Telehealth: Payer: Self-pay | Admitting: Obstetrics and Gynecology

## 2017-12-23 ENCOUNTER — Other Ambulatory Visit: Payer: Self-pay

## 2017-12-23 ENCOUNTER — Ambulatory Visit (INDEPENDENT_AMBULATORY_CARE_PROVIDER_SITE_OTHER): Payer: 59 | Admitting: Obstetrics and Gynecology

## 2017-12-23 VITALS — BP 178/80 | HR 84 | Resp 16 | Wt 188.0 lb

## 2017-12-23 DIAGNOSIS — N760 Acute vaginitis: Secondary | ICD-10-CM | POA: Diagnosis not present

## 2017-12-23 DIAGNOSIS — B9689 Other specified bacterial agents as the cause of diseases classified elsewhere: Secondary | ICD-10-CM | POA: Diagnosis not present

## 2017-12-23 MED ORDER — METRONIDAZOLE 500 MG PO TABS: 500.0000 mg | ORAL_TABLET | Freq: Two times a day (BID) | ORAL | 0 refills | Status: DC

## 2017-12-23 NOTE — Telephone Encounter (Signed)
Spoke with patient. Patient states that she is having BV symptoms and would like an appointment. Appointment scheduled for 2:45 pm today with Dr.Jertson. Patient is agreeable to date and time.  Routing to provider for final review. Patient agreeable to disposition. Will close encounter.

## 2017-12-23 NOTE — Patient Instructions (Signed)
Bacterial Vaginosis Bacterial vaginosis is a vaginal infection that occurs when the normal balance of bacteria in the vagina is disrupted. It results from an overgrowth of certain bacteria. This is the most common vaginal infection among women ages 15-44. Because bacterial vaginosis increases your risk for STIs (sexually transmitted infections), getting treated can help reduce your risk for chlamydia, gonorrhea, herpes, and HIV (human immunodeficiency virus). Treatment is also important for preventing complications in pregnant women, because this condition can cause an early (premature) delivery. What are the causes? This condition is caused by an increase in harmful bacteria that are normally present in small amounts in the vagina. However, the reason that the condition develops is not fully understood. What increases the risk? The following factors may make you more likely to develop this condition:  Having a new sexual partner or multiple sexual partners.  Having unprotected sex.  Douching.  Having an intrauterine device (IUD).  Smoking.  Drug and alcohol abuse.  Taking certain antibiotic medicines.  Being pregnant.  You cannot get bacterial vaginosis from toilet seats, bedding, swimming pools, or contact with objects around you. What are the signs or symptoms? Symptoms of this condition include:  Grey or white vaginal discharge. The discharge can also be watery or foamy.  A fish-like odor with discharge, especially after sexual intercourse or during menstruation.  Itching in and around the vagina.  Burning or pain with urination.  Some women with bacterial vaginosis have no signs or symptoms. How is this diagnosed? This condition is diagnosed based on:  Your medical history.  A physical exam of the vagina.  Testing a sample of vaginal fluid under a microscope to look for a large amount of bad bacteria or abnormal cells. Your health care provider may use a cotton swab  or a small wooden spatula to collect the sample.  How is this treated? This condition is treated with antibiotics. These may be given as a pill, a vaginal cream, or a medicine that is put into the vagina (suppository). If the condition comes back after treatment, a second round of antibiotics may be needed. Follow these instructions at home: Medicines  Take over-the-counter and prescription medicines only as told by your health care provider.  Take or use your antibiotic as told by your health care provider. Do not stop taking or using the antibiotic even if you start to feel better. General instructions  If you have a female sexual partner, tell her that you have a vaginal infection. She should see her health care provider and be treated if she has symptoms. If you have a female sexual partner, he does not need treatment.  During treatment: ? Avoid sexual activity until you finish treatment. ? Do not douche. ? Avoid alcohol as directed by your health care provider. ? Avoid breastfeeding as directed by your health care provider.  Drink enough water and fluids to keep your urine clear or pale yellow.  Keep the area around your vagina and rectum clean. ? Wash the area daily with warm water. ? Wipe yourself from front to back after using the toilet.  Keep all follow-up visits as told by your health care provider. This is important. How is this prevented?  Do not douche.  Wash the outside of your vagina with warm water only.  Use protection when having sex. This includes latex condoms and dental dams.  Limit how many sexual partners you have. To help prevent bacterial vaginosis, it is best to have sex with just   one partner (monogamous).  Make sure you and your sexual partner are tested for STIs.  Wear cotton or cotton-lined underwear.  Avoid wearing tight pants and pantyhose, especially during summer.  Limit the amount of alcohol that you drink.  Do not use any products that  contain nicotine or tobacco, such as cigarettes and e-cigarettes. If you need help quitting, ask your health care provider.  Do not use illegal drugs. Where to find more information:  Centers for Disease Control and Prevention: www.cdc.gov/std  American Sexual Health Association (ASHA): www.ashastd.org  U.S. Department of Health and Human Services, Office on Women's Health: www.womenshealth.gov/ or https://www.womenshealth.gov/a-z-topics/bacterial-vaginosis Contact a health care provider if:  Your symptoms do not improve, even after treatment.  You have more discharge or pain when urinating.  You have a fever.  You have pain in your abdomen.  You have pain during sex.  You have vaginal bleeding between periods. Summary  Bacterial vaginosis is a vaginal infection that occurs when the normal balance of bacteria in the vagina is disrupted.  Because bacterial vaginosis increases your risk for STIs (sexually transmitted infections), getting treated can help reduce your risk for chlamydia, gonorrhea, herpes, and HIV (human immunodeficiency virus). Treatment is also important for preventing complications in pregnant women, because the condition can cause an early (premature) delivery.  This condition is treated with antibiotic medicines. These may be given as a pill, a vaginal cream, or a medicine that is put into the vagina (suppository). This information is not intended to replace advice given to you by your health care provider. Make sure you discuss any questions you have with your health care provider. Document Released: 12/03/2005 Document Revised: 04/08/2017 Document Reviewed: 08/18/2016 Elsevier Interactive Patient Education  2018 [employer]  

## 2017-12-23 NOTE — Progress Notes (Signed)
GYNECOLOGY  VISIT   HPI: 42 y.o.   Legally Separated  African American  female   [phone] with Patient's last menstrual period was 04/17/2014.here for patient states that she saw a blister in November 2018, has not noticed any blisters since then. She had one painful blister. She has a h/o hsv, this was different. Hasn't returned. Currently has white vaginal discharge with odor off and on since November. Currently sexually active, same partner x 1.5 years.   GYNECOLOGIC HISTORY: Patient's last menstrual period was 04/17/2014. Contraception:Hysterectomy Menopausal hormone therapy: none        OB History    Gravida Para Term Preterm AB Living   4 2     1 2    SAB TAB Ectopic Multiple Live Births     1               Patient Active Problem List   Diagnosis Date Noted  . Obesity (BMI 30.0-34.9) 07/02/2017  . MDD (major depressive disorder) 07/16/2016  . Asthma, mild 04/11/2015  . GAD (generalized anxiety disorder) 02/08/2015  . Hyperlipidemia 07/09/2014  . Insomnia 07/09/2014  . Other malaise and fatigue 07/09/2014  . Nasal congestion 07/09/2014  . Urinary problem 07/09/2014  . Lipoma of abdominal wall s/p excision 05/04/2014 05/19/2014  . Status post total abdominal hysterectomy 05/04/2014  . Hypertension   . Vitamin D deficiency     Past Medical History:  Diagnosis Date  . Anxiety   . Asthma    no symptoms in "years", no inhaler  . Depression   . Herpes simplex   . Hyperlysinemia ([place])   . Hypertension   . Sickle cell trait ([place])   . Vitamin D deficiency     Past Surgical History:  Procedure Laterality Date  . ABDOMINAL HYSTERECTOMY N/A 05/04/2014   Procedure: HYSTERECTOMY ABDOMINAL with BILATERAL SALPINGECTOMY ;  Surgeon: Everardo All Amundson de Berton Lan, MD;  Location: [facility];  Service: Gynecology;  Laterality: N/A;  . BREAST BIOPSY Right Jan. 2016   benign fibroadenoma  . FOOT SURGERY    . LAPAROTOMY N/A 05/04/2014   Procedure: REMOVAL OF ABD WALL MASS ;   Surgeon: Adin Hector, MD;  Location: [facility];  Service: General;  Laterality: N/A;  . LIPOMA EXCISION N/A 05/04/2014   Procedure: EXCISION LIPOMA of abdominal wall;  Surgeon: Jamey Reas de Berton Lan, MD;  Location: [facility];  Service: Gynecology;  Laterality: N/A;  . TUBAL LIGATION      Current Outpatient Medications  Medication Sig Dispense Refill  . albuterol (PROVENTIL HFA;VENTOLIN HFA) 108 (90 Base) MCG/ACT inhaler Inhale 2 puffs into the lungs every 6 (six) hours as needed for wheezing or shortness of breath. 1 Inhaler 2  . benazepril (LOTENSIN) 20 MG tablet TAKE 1 TABLET BY MOUTH ONCE DAILY 90 tablet 1  . cloNIDine (CATAPRES) 0.1 MG tablet TAKE 1 TABLET BY MOUTH TWICE DAILY 180 tablet 1  . fluticasone (FLONASE) 50 MCG/ACT nasal spray Place 2 sprays into the nose daily as needed for allergies or rhinitis.     Marland Kitchen ibuprofen (ADVIL,MOTRIN) 800 MG tablet Take 1 tablet (800 mg total) by mouth every 8 (eight) hours as needed. 30 tablet 0  . levocetirizine (XYZAL) 5 MG tablet TAKE 1 TABLET BY MOUTH IN THE EVENING 90 tablet 3  . TRINTELLIX 10 MG TABS TAKE 1 TABLET BY MOUTH ONCE DAILY 90 tablet 0  . Vitamin D, Ergocalciferol, (DRISDOL) 50000 units CAPS capsule Take 1 capsule (50,000 Units  total) by mouth every 7 (seven) days. Vit D (Patient taking differently: Take 50,000 Units by mouth every 7 (seven) days. On Wednesdays) 12 capsule 1   No current facility-administered medications for this visit.      ALLERGIES: Vicodin [hydrocodone-acetaminophen] and Betadine [povidone iodine]  Family History  Problem Relation Age of Onset  . Diabetes Mother   . Thyroid disease Mother   . Stroke Father   . Mental retardation Maternal Aunt   . Diabetes Maternal Grandmother     Social History   Socioeconomic History  . Marital status: Legally Separated    Spouse name: Not on file  . Number of children: Not on file  . Years of education: Not on file  . Highest education level: Not on file   Social Needs  . Financial resource strain: Not on file  . Food insecurity - worry: Not on file  . Food insecurity - inability: Not on file  . Transportation needs - medical: Not on file  . Transportation needs - non-medical: Not on file  Occupational History  . Not on file  Tobacco Use  . Smoking status: Never Smoker  . Smokeless tobacco: Never Used  Substance and Sexual Activity  . Alcohol use: Yes    Alcohol/week: 0.0 oz    Comment: 2-3 x/year  . Drug use: No  . Sexual activity: Yes    Partners: Male    Birth control/protection: Surgical    Comment: BTSP 2005/TAH/BSO  Other Topics Concern  . Not on file  Social History Narrative  . Not on file    Review of Systems  Constitutional: Negative.   HENT: Negative.   Eyes: Negative.   Respiratory: Negative.   Cardiovascular: Negative.   Gastrointestinal: Negative.   Genitourinary:       Abnormal discharge   Musculoskeletal: Negative.   Skin: Negative.   Neurological: Negative.   Endo/Heme/Allergies: Negative.   Psychiatric/Behavioral: Negative.     PHYSICAL EXAMINATION:    BP (!) 178/80 (BP Location: Right Arm, Patient Position: Sitting, Cuff Size: Normal)   Pulse 84   Resp 16   Wt 188 lb (85.3 kg)   LMP 04/17/2014   BMI 30.34 kg/m     General appearance: alert, cooperative and appears stated age  Pelvic: External genitalia:  no lesions              Urethra:  normal appearing urethra with no masses, tenderness or lesions              Bartholins and Skenes: normal                 Vagina: normal appearing vagina with a slight increase in watery, white vaginal d/c              Cervix: absent   Chaperone was present for exam.  Wet prep: ++ clue, no trich, rare wbc KOH: no yeast PH: 5   ASSESSMENT Bacterial vaginitis    PLAN Flagyl 500 mg BID x 7 days (avoid ETOH) Call with any concerns   An After Visit Summary was printed and given to the patient.

## 2017-12-23 NOTE — Telephone Encounter (Signed)
Patient thinks she may have BV and would like an appointment this afternoon if possible. To triage to assist with scheduling.

## 2018-01-03 ENCOUNTER — Ambulatory Visit: Payer: 59 | Admitting: Family Medicine

## 2018-01-03 ENCOUNTER — Other Ambulatory Visit: Payer: Self-pay

## 2018-01-03 ENCOUNTER — Encounter: Payer: Self-pay | Admitting: Family Medicine

## 2018-01-03 VITALS — BP 130/74 | HR 80 | Temp 98.4°F | Resp 16 | Ht 66.0 in | Wt 188.0 lb

## 2018-01-03 DIAGNOSIS — E669 Obesity, unspecified: Secondary | ICD-10-CM | POA: Diagnosis not present

## 2018-01-03 DIAGNOSIS — Z23 Encounter for immunization: Secondary | ICD-10-CM | POA: Diagnosis not present

## 2018-01-03 DIAGNOSIS — F322 Major depressive disorder, single episode, severe without psychotic features: Secondary | ICD-10-CM | POA: Diagnosis not present

## 2018-01-03 DIAGNOSIS — I10 Essential (primary) hypertension: Secondary | ICD-10-CM | POA: Diagnosis not present

## 2018-01-03 DIAGNOSIS — E559 Vitamin D deficiency, unspecified: Secondary | ICD-10-CM

## 2018-01-03 DIAGNOSIS — E782 Mixed hyperlipidemia: Secondary | ICD-10-CM | POA: Diagnosis not present

## 2018-01-03 DIAGNOSIS — J019 Acute sinusitis, unspecified: Secondary | ICD-10-CM | POA: Diagnosis not present

## 2018-01-03 MED ORDER — VORTIOXETINE HBR 20 MG PO TABS: 20.0000 mg | ORAL_TABLET | Freq: Every day | ORAL | 2 refills | Status: DC

## 2018-01-03 MED ORDER — FLUCONAZOLE 150 MG PO TABS: 150.0000 mg | ORAL_TABLET | Freq: Once | ORAL | 1 refills | Status: AC

## 2018-01-03 MED ORDER — AMOXICILLIN 875 MG PO TABS: 875.0000 mg | ORAL_TABLET | Freq: Two times a day (BID) | ORAL | 0 refills | Status: DC

## 2018-01-03 NOTE — Assessment & Plan Note (Signed)
Increase Trintillex to 20mg  once a day  She does report that she is doing better at her job

## 2018-01-03 NOTE — Progress Notes (Signed)
Subjective:    Patient ID: Mary Robles, female    DOB: Apr 29, 1976, 42 y.o.   MRN: [id number]  Patient presents for Follow-up (is fasting)  Here to follow-up chronic medical problems.  Hypertension her blood pressures have been up and down.  At her GYN visit a week ago her blood pressure was 178/80.  She does not check her BP, but states she can tell it is because her chest will hurt? No SO,B, no radiating symptoms  She is currently on benazepril as well as clonidine She was seen in October because of elevated blood pressures noted at the urgent care as well as our office amlodipine 5 mg was added, she had been out of the benezapril initially. She never took the norvasc   Vitamin D def, due for recheck  LDL 112 in April     Stress at work, she is taking trintillex but still feels stressed out.  She is now officially divorced as well.  She feels like she could use an increase in her medication.   Sinus pressure drainage for the past couple weeks.  She is now is getting bloody drainage from the right side.  She is not had any fever does have mild headache.  Review Of Systems:  GEN- denies fatigue, fever, weight loss,weakness, recent illness HEENT- denies eye drainage, change in vision, +nasal discharge, CVS- denies chest pain, palpitations RESP- denies SOB, cough, wheeze ABD- denies N/V, change in stools, abd pain GU- denies dysuria, hematuria, dribbling, incontinence MSK- denies joint pain, muscle aches, injury Neuro- denies headache, dizziness, syncope, seizure activity       Objective:    BP 130/74   Pulse 80   Temp 98.4 F (36.9 C) (Oral)   Resp 16   Ht 5\' 6"  (1.676 m)   Wt 188 lb (85.3 kg)   LMP 04/17/2014   SpO2 98%   BMI 30.34 kg/m  GEN- NAD, alert and oriented x3 , repeat  138/84 HEENT- PERRL, EOMI, non injected sclera, pink conjunctiva, MMM, oropharynx clear+ maxillary sinus and frontal tenderness, dry blood in right canal enlarged turbinates  Neck- Supple,  no thyromegaly , no LAD CVS- RRR, no murmur RESP-CTAB ABD-NABS,soft,NT,ND Psych- normal affect and mood, no SI EXT- No edema Pulses- Radial, DP- 2+        Assessment & Plan:      Problem List Items Addressed This Visit      Unprioritized   Hyperlipidemia   Relevant Orders   Lipid panel   Vitamin D deficiency   Relevant Orders   Vitamin D, 25-hydroxy   Obesity (BMI 30.0-34.9)   MDD (major depressive disorder)    Increase Trintillex to 20mg  once a day  She does report that she is doing better at her job      Relevant Medications   vortioxetine HBr (TRINTELLIX) 20 MG TABS   Hypertension - Primary    Your looks okay today.  I am not to change her medications.  She feels like it goes up when she is very stressed.  Fasting labs will be checked today including her cholesterol and vitamin D she has history of deficiency.  We discussed exercising for both cardiovascular help as well as helping with stress.      Relevant Orders   CBC with Differential/Platelet   Comprehensive metabolic panel    Other Visit Diagnoses    Acute rhinosinusitis       Amox, nasal saline, hold the flonase due to the nose bleeds  Relevant Medications   amoxicillin (AMOXIL) 875 MG tablet   fluconazole (DIFLUCAN) 150 MG tablet      Note: This dictation was prepared with Dragon dictation along with smaller phrase technology. Any transcriptional errors that result from this process are unintentional.

## 2018-01-03 NOTE — Assessment & Plan Note (Signed)
Your looks okay today.  I am not to change her medications.  She feels like it goes up when she is very stressed.  Fasting labs will be checked today including her cholesterol and vitamin D she has history of deficiency.  We discussed exercising for both cardiovascular help as well as helping with stress.

## 2018-01-03 NOTE — Patient Instructions (Addendum)
Trintillex increased to  20mg   Take antibiotics  Monitor your blood pressure Flu shot given  F/U 2 months

## 2018-01-04 LAB — COMPREHENSIVE METABOLIC PANEL
AG Ratio: 1.4 (calc) (ref 1.0–2.5)
ALBUMIN MSPROF: 3.9 g/dL (ref 3.6–5.1)
ALT: 27 U/L (ref 6–29)
AST: 24 U/L (ref 10–30)
Alkaline phosphatase (APISO): 47 U/L (ref 33–115)
BUN: 8 mg/dL (ref 7–25)
CO2: 27 mmol/L (ref 20–32)
Calcium: 9.1 mg/dL (ref 8.6–10.2)
Chloride: 105 mmol/L (ref 98–110)
Creat: 0.9 mg/dL (ref 0.50–1.10)
GLOBULIN: 2.7 g/dL (ref 1.9–3.7)
GLUCOSE: 85 mg/dL (ref 65–99)
POTASSIUM: 4.5 mmol/L (ref 3.5–5.3)
Sodium: 139 mmol/L (ref 135–146)
Total Bilirubin: 0.6 mg/dL (ref 0.2–1.2)
Total Protein: 6.6 g/dL (ref 6.1–8.1)

## 2018-01-04 LAB — CBC WITH DIFFERENTIAL/PLATELET
BASOS ABS: 49 {cells}/uL (ref 0–200)
Basophils Relative: 0.6 %
EOS PCT: 2.3 %
Eosinophils Absolute: 189 cells/uL (ref 15–500)
HCT: 41.2 % (ref 35.0–45.0)
Hemoglobin: 13.7 g/dL (ref 11.7–15.5)
Lymphs Abs: 2780 cells/uL (ref 850–3900)
MCH: 27.3 pg (ref 27.0–33.0)
MCHC: 33.3 g/dL (ref 32.0–36.0)
MCV: 82.1 fL (ref 80.0–100.0)
MPV: 10.6 fL (ref 7.5–12.5)
Monocytes Relative: 7.9 %
NEUTROS PCT: 55.3 %
Neutro Abs: 4535 cells/uL (ref 1500–7800)
PLATELETS: 353 10*3/uL (ref 140–400)
RBC: 5.02 10*6/uL (ref 3.80–5.10)
RDW: 15 % (ref 11.0–15.0)
TOTAL LYMPHOCYTE: 33.9 %
WBC: 8.2 10*3/uL (ref 3.8–10.8)
WBCMIX: 648 {cells}/uL (ref 200–950)

## 2018-01-04 LAB — LIPID PANEL
CHOL/HDL RATIO: 4.4 (calc) (ref <5.0)
Cholesterol: 171 mg/dL (ref <200)
HDL: 39 mg/dL — ABNORMAL LOW (ref >50)
LDL Cholesterol (Calc): 112 mg/dL (calc) — ABNORMAL HIGH
NON-HDL CHOLESTEROL (CALC): 132 mg/dL — AB (ref <130)
Triglycerides: 95 mg/dL (ref <150)

## 2018-01-04 LAB — VITAMIN D 25 HYDROXY (VIT D DEFICIENCY, FRACTURES): Vit D, 25-Hydroxy: 19 ng/mL — ABNORMAL LOW (ref 30–100)

## 2018-01-09 ENCOUNTER — Other Ambulatory Visit: Payer: Self-pay | Admitting: *Deleted

## 2018-01-09 MED ORDER — VITAMIN D (ERGOCALCIFEROL) 1.25 MG (50000 UNIT) PO CAPS: 50000.0000 [IU] | ORAL_CAPSULE | ORAL | 1 refills | Status: DC

## 2018-02-20 ENCOUNTER — Ambulatory Visit (HOSPITAL_COMMUNITY)
Admission: EM | Admit: 2018-02-20 | Discharge: 2018-02-20 | Disposition: A | Discharge status: Home / Self Care | Payer: 59 | Attending: Family Medicine | Admitting: Family Medicine

## 2018-02-20 ENCOUNTER — Encounter (HOSPITAL_COMMUNITY): Payer: Self-pay | Admitting: Emergency Medicine

## 2018-02-20 ENCOUNTER — Other Ambulatory Visit: Payer: Self-pay

## 2018-02-20 DIAGNOSIS — T783XXA Angioneurotic edema, initial encounter: Secondary | ICD-10-CM | POA: Diagnosis not present

## 2018-02-20 MED ORDER — PREDNISONE 20 MG PO TABS: ORAL_TABLET | ORAL | 0 refills | Status: DC

## 2018-02-20 NOTE — ED Provider Notes (Addendum)
[facility]   [id number] 02/20/18 Arrival Time: 9937   SUBJECTIVE:  Mary Robles is a 42 y.o. female who presents to the urgent care with complaint of lip swelling after eating BBQ [place].  Took Benadryl without relief.   This has happened several times since the patient started Benazepril.  No shortness of breath or tongue swelling   Past Medical History:  Diagnosis Date  . Anxiety   . Asthma    no symptoms in "years", no inhaler  . Depression   . Herpes simplex   . 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 Hypertension   . Sickle cell trait ([place])   . Vitamin D deficiency    Family History  Problem Relation Age of Onset  . Diabetes Mother   . Thyroid disease Mother   . Stroke Father   . Mental retardation Maternal Aunt   . Diabetes Maternal Grandmother    Social History   Socioeconomic History  . Marital status: Legally Separated    Spouse name: Not on file  . Number of children: Not on file  . Years of education: Not on file  . Highest education level: Not on file  Social Needs  . Financial resource strain: Not on file  . Food insecurity - worry: Not on file  . Food insecurity - inability: Not on file  . Transportation needs - medical: Not on file  . Transportation needs - non-medical: Not on file  Occupational History  . Not on file  Tobacco Use  . Smoking status: Never Smoker  . Smokeless tobacco: Never Used  Substance and Sexual Activity  . Alcohol use: Yes    Alcohol/week: 0.0 oz    Comment: 2-3 x/year  . Drug use: No  . Sexual activity: Yes    Partners: Male    Birth control/protection: Surgical    Comment: BTSP 2005/TAH/BSO  Other Topics Concern  . Not on file  Social History Narrative  . Not on file   Current Meds  Medication Sig  . cloNIDine (CATAPRES) 0.1 MG tablet TAKE 1 TABLET BY MOUTH TWICE DAILY  . fluticasone (FLONASE) 50 MCG/ACT nasal spray Place 2 sprays into the nose daily as needed for allergies or rhinitis.   Marland Kitchen levocetirizine (XYZAL)  5 MG tablet TAKE 1 TABLET BY MOUTH IN THE EVENING  . Vitamin D, Ergocalciferol, (DRISDOL) 50000 units CAPS capsule Take 1 capsule (50,000 Units total) by mouth every 7 (seven) days. x6 months.  . vortioxetine HBr (TRINTELLIX) 20 MG TABS Take 20 mg by mouth daily.  . [DISCONTINUED] benazepril (LOTENSIN) 20 MG tablet TAKE 1 TABLET BY MOUTH ONCE DAILY   Allergies  Allergen Reactions  . Vicodin [Hydrocodone-Acetaminophen] Other (See Comments)    Hallucination  . Betadine [Povidone Iodine]     Reaction:Blisters      ROS: As per HPI, remainder of ROS negative.   OBJECTIVE:   Vitals:   02/20/18 1038  BP: (!) 162/99  Pulse: 92  Resp: 16  Temp: 98.4 F (36.9 C)  SpO2: 100%     General appearance: alert; no distress Eyes: PERRL; EOMI; conjunctiva normal HENT: normocephalic; atraumatic oral mucosa normal;  Both upper and lower lips moderately swollen. Neck: supple Lungs: clear to auscultation bilaterally Heart: regular rate and rhythm Back: no CVA tenderness Extremities: no cyanosis or edema; symmetrical with no gross deformities Skin: warm and dry Neurologic: normal gait; grossly normal Psychological: alert and cooperative; normal mood and affect      Labs:  Results for orders  placed or performed in visit on 01/03/18  CBC with Differential/Platelet  Result Value Ref Range   WBC 8.2 3.8 - 10.8 Thousand/uL   RBC 5.02 3.80 - 5.10 Million/uL   Hemoglobin 13.7 11.7 - 15.5 g/dL   HCT 41.2 35.0 - 45.0 %   MCV 82.1 80.0 - 100.0 fL   MCH 27.3 27.0 - 33.0 pg   MCHC 33.3 32.0 - 36.0 g/dL   RDW 15.0 11.0 - 15.0 %   Platelets 353 140 - 400 Thousand/uL   MPV 10.6 7.5 - 12.5 fL   Neutro Abs 4,535 1,500 - 7,800 cells/uL   Lymphs Abs 2,780 850 - 3,900 cells/uL   WBC mixed population 648 200 - 950 cells/uL   Eosinophils Absolute 189 15 - 500 cells/uL   Basophils Absolute 49 0 - 200 cells/uL   Neutrophils Relative % 55.3 %   Total Lymphocyte 33.9 %   Monocytes Relative 7.9 %     Eosinophils Relative 2.3 %   Basophils Relative 0.6 %  Comprehensive metabolic panel  Result Value Ref Range   Glucose, Bld 85 65 - 99 mg/dL   BUN 8 7 - 25 mg/dL   Creat 0.90 0.50 - 1.10 mg/dL   BUN/Creatinine Ratio NOT APPLICABLE 6 - 22 (calc)   Sodium 139 135 - 146 mmol/L   Potassium 4.5 3.5 - 5.3 mmol/L   Chloride 105 98 - 110 mmol/L   CO2 27 20 - 32 mmol/L   Calcium 9.1 8.6 - 10.2 mg/dL   Total Protein 6.6 6.1 - 8.1 g/dL   Albumin 3.9 3.6 - 5.1 g/dL   Globulin 2.7 1.9 - 3.7 g/dL (calc)   AG Ratio 1.4 1.0 - 2.5 (calc)   Total Bilirubin 0.6 0.2 - 1.2 mg/dL   Alkaline phosphatase (APISO) 47 33 - 115 U/L   AST 24 10 - 30 U/L   ALT 27 6 - 29 U/L  Lipid panel  Result Value Ref Range   Cholesterol 171 <200 mg/dL   HDL 39 (L) >50 mg/dL   Triglycerides 95 <150 mg/dL   LDL Cholesterol (Calc) 112 (H) mg/dL (calc)   Total CHOL/HDL Ratio 4.4 <5.0 (calc)   Non-HDL Cholesterol (Calc) 132 (H) <130 mg/dL (calc)  Vitamin D, 25-hydroxy  Result Value Ref Range   Vit D, 25-Hydroxy 19 (L) 30 - 100 ng/mL    Labs Reviewed - No data to display  No results found.     ASSESSMENT & PLAN:  1. Angioedema, initial encounter     Meds ordered this encounter  Medications  . predniSONE (DELTASONE) 20 MG tablet    Sig: Two daily with food    Dispense:  10 tablet    Refill:  0    Reviewed expectations re: course of current medical issues. Questions answered. Outlined signs and symptoms indicating need for more acute intervention. Patient verbalized understanding. After Visit Summary given.    Procedures:      Robyn Haber, MD 02/20/18 1112    Robyn Haber, MD 02/20/18 1112

## 2018-02-20 NOTE — Discharge Instructions (Signed)
Please stop the Benazepril since it has been known to cause angioedema.

## 2018-02-20 NOTE — ED Triage Notes (Signed)
Pt states she ate turky bbq night before last and noticed some lip swellling, pt states she ate it Monday and Tuesday night, states she woke up and its still having swelling, pt took benadryl without relief.

## 2018-02-21 ENCOUNTER — Ambulatory Visit: Payer: 59 | Admitting: Family Medicine

## 2018-02-21 ENCOUNTER — Encounter: Payer: Self-pay | Admitting: Family Medicine

## 2018-02-21 ENCOUNTER — Other Ambulatory Visit: Payer: Self-pay

## 2018-02-21 VITALS — BP 158/98 | HR 84 | Temp 98.2°F | Resp 14 | Ht 66.0 in | Wt 188.0 lb

## 2018-02-21 DIAGNOSIS — T783XXD Angioneurotic edema, subsequent encounter: Secondary | ICD-10-CM | POA: Diagnosis not present

## 2018-02-21 DIAGNOSIS — I10 Essential (primary) hypertension: Secondary | ICD-10-CM

## 2018-02-21 MED ORDER — HYDROCHLOROTHIAZIDE 25 MG PO TABS: 25.0000 mg | ORAL_TABLET | Freq: Every day | ORAL | 6 refills | Status: DC

## 2018-02-21 NOTE — Patient Instructions (Addendum)
Start HCTZ once a day in the morning Continue clonidine  F/U as needed

## 2018-02-21 NOTE — Assessment & Plan Note (Signed)
ACEI on allergy list Start HCTZ once a day , was on in past did not remember any problems with it Continue clonidine F/u in a few weeks to recheck BP

## 2018-02-21 NOTE — Progress Notes (Signed)
   Subjective:    Patient ID: Mary Robles, female    DOB: Jul 24, 1976, 42 y.o.   MRN: [id number]  Patient presents for UC F/U (angioedema D/T Benazepril)    Angioedema-  lips swelled up completely , started on upper left side, thought it was something she ate, then next day Wed entire mouth was swollen, no tongue swelling  she has a few episodes within the past few years, thought it was other allergies  Taking benezapril [facility], given prednisone 20mg  BID for 5 days, benadryl.  Has had a little dry cough     Review Of Systems:  GEN- denies fatigue, fever, weight loss,weakness, recent illness HEENT- denies eye drainage, change in vision, nasal discharge, CVS- denies chest pain, palpitations RESP- denies SOB, cough, wheeze ABD- denies N/V, change in stools, abd pain GU- denies dysuria, hematuria, dribbling, incontinence MSK- denies joint pain, muscle aches, injury Neuro- denies headache, dizziness, syncope, seizure activity       Objective:    BP (!) 158/98   Pulse 84   Temp 98.2 F (36.8 C) (Oral)   Resp 14   Ht 5\' 6"  (1.676 m)   Wt 188 lb (85.3 kg)   LMP 04/17/2014   SpO2 99%   BMI 30.34 kg/m  GEN- NAD, alert and oriented x3 HEENT- PERRL, EOMI, non injected sclera, pink conjunctiva, MMM, oropharynx clear, mild swelling of upper and lower lips, uvula midline, no tongue swelling  Neck- Supple, no LAD CVS- RRR, no murmur RESP-CTAB, no wheeze  EXT- No edema Pulses- Radial, DP- 2+        Assessment & Plan:      Problem List Items Addressed This Visit      Unprioritized   Hypertension - Primary    ACEI on allergy list Start HCTZ once a day , was on in past did not remember any problems with it Continue clonidine F/u in a few weeks to recheck BP      Relevant Medications   hydrochlorothiazide (HYDRODIURIL) 25 MG tablet    Other Visit Diagnoses    Angioedema, subsequent encounter       complete steroids, keep benadryl on hand, it is resolving        Note: This dictation was prepared with Dragon dictation along with smaller phrase technology. Any transcriptional errors that result from this process are unintentional.

## 2018-02-24 ENCOUNTER — Ambulatory Visit: Payer: 59 | Admitting: Obstetrics and Gynecology

## 2018-02-24 ENCOUNTER — Encounter: Payer: Self-pay | Admitting: Obstetrics and Gynecology

## 2018-02-24 ENCOUNTER — Other Ambulatory Visit: Payer: Self-pay

## 2018-02-24 VITALS — BP 160/98 | HR 76 | Resp 16 | Ht 65.25 in | Wt 186.0 lb

## 2018-02-24 DIAGNOSIS — Z01419 Encounter for gynecological examination (general) (routine) without abnormal findings: Secondary | ICD-10-CM

## 2018-02-24 MED ORDER — VALACYCLOVIR HCL 500 MG PO TABS: 500.0000 mg | ORAL_TABLET | Freq: Two times a day (BID) | ORAL | 2 refills | Status: DC

## 2018-02-24 NOTE — Progress Notes (Signed)
42 y.o. Mary Robles Legally Separated African American female here for annual exam.    Wants to have Rx for HSV med to take prn.   Wants to know if lipomas can return.   Bloating.  This is not new.  PCP:   Vic Blackbird, MD  Patient's last menstrual period was 04/17/2014.           Sexually active: Yes.    The current method of family planning is Hysterectomy -- Left ovary remains.    Exercising: No.  The patient does not participate in regular exercise at present. Smoker:  no  Health Maintenance: Pap:  01-21-14 Neg:Neg HR HPV History of abnormal Pap:  Yes, Hx of dysplasia and cryotherapy to cervix in 2000 MMG:  04/18/17 BIRADS 1 negative/density b TDaP:  09/03/12 Gardasil:   no HIV and Hep C: 04/30/16 Negative Screening Labs:  PCP   reports that  has never smoked. she has never used smokeless tobacco. She reports that she drinks alcohol. She reports that she does not use drugs.  Past Medical History:  Diagnosis Date  . Anxiety   . Asthma    no symptoms in "years", no inhaler  . Depression   . Herpes simplex   . 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 Hypertension   . Sickle cell trait ([place])   . Vitamin D deficiency     Past Surgical History:  Procedure Laterality Date  . ABDOMINAL HYSTERECTOMY N/A 05/04/2014   Procedure: HYSTERECTOMY ABDOMINAL with BILATERAL SALPINGECTOMY ;  Surgeon: Everardo All Amundson de Berton Lan, MD;  Location: [facility];  Service: Gynecology;  Laterality: N/A;  . BREAST BIOPSY Right Jan. 2016   benign fibroadenoma  . FOOT SURGERY    . LAPAROTOMY N/A 05/04/2014   Procedure: REMOVAL OF ABD WALL MASS ;  Surgeon: Adin Hector, MD;  Location: [facility];  Service: General;  Laterality: N/A;  . LIPOMA EXCISION N/A 05/04/2014   Procedure: EXCISION LIPOMA of abdominal wall;  Surgeon: Jamey Reas de Berton Lan, MD;  Location: [facility];  Service: Gynecology;  Laterality: N/A;  . PELVIC LAPAROSCOPY Right 2018   oophorectomy  . TUBAL LIGATION      Current  Outpatient Medications  Medication Sig Dispense Refill  . albuterol (PROVENTIL HFA;VENTOLIN HFA) 108 (90 Base) MCG/ACT inhaler Inhale 2 puffs into the lungs every 6 (six) hours as needed for wheezing or shortness of breath. 1 Inhaler 2  . cloNIDine (CATAPRES) 0.1 MG tablet TAKE 1 TABLET BY MOUTH TWICE DAILY 180 tablet 1  . fluticasone (FLONASE) 50 MCG/ACT nasal spray Place 2 sprays into the nose daily as needed for allergies or rhinitis.     . hydrochlorothiazide (HYDRODIURIL) 25 MG tablet Take 1 tablet (25 mg total) by mouth daily. 30 tablet 6  . levocetirizine (XYZAL) 5 MG tablet TAKE 1 TABLET BY MOUTH IN THE EVENING 90 tablet 3  . Vitamin D, Ergocalciferol, (DRISDOL) 50000 units CAPS capsule Take 1 capsule (50,000 Units total) by mouth every 7 (seven) days. x6 months. 12 capsule 1  . vortioxetine HBr (TRINTELLIX) 20 MG TABS Take 20 mg by mouth daily. 90 tablet 2  . valACYclovir (VALTREX) 500 MG tablet Take 1 tablet (500 mg total) by mouth 2 (two) times daily. Take as needed for 3 days. 30 tablet 2   No current facility-administered medications for this visit.     Family History  Problem Relation Age of Onset  . Diabetes Mother   . Thyroid disease Mother   .  Stroke Father   . Mental retardation Maternal Aunt   . Diabetes Maternal Grandmother     ROS:  Pertinent items are noted in HPI.  Otherwise, a comprehensive ROS was negative.  Exam:   BP (!) 160/98 (BP Location: Right Arm, Patient Position: Sitting, Cuff Size: Normal)   Pulse 76   Resp 16   Ht 5' 5.25" (1.657 m)   Wt 186 lb (84.4 kg)   LMP 04/17/2014   BMI 30.72 kg/m     General appearance: alert, cooperative and appears stated age Head: Normocephalic, without obvious abnormality, atraumatic Neck: no adenopathy, supple, symmetrical, trachea midline and thyroid normal to inspection and palpation Lungs: clear to auscultation bilaterally Breasts: normal appearance, no masses or tenderness, No nipple retraction or dimpling, No  nipple discharge or bleeding, No axillary or supraclavicular adenopathy Heart: regular rate and rhythm Abdomen: soft, non-tender; no masses, no organomegaly Extremities: extremities normal, atraumatic, no cyanosis or edema Skin: Skin color, texture, turgor normal. No rashes or lesions Lymph nodes: Cervical, supraclavicular, and axillary nodes normal. No abnormal inguinal nodes palpated Neurologic: Grossly normal  Pelvic: External genitalia:  no lesions              Urethra:  normal appearing urethra with no masses, tenderness or lesions              Bartholins and Skenes: normal                 Vagina: normal appearing vagina with normal color and discharge, no lesions              Cervix: absent.              Pap taken: No. Bimanual Exam:  Uterus:   Absent.              Adnexa: no mass, fullness, tenderness              Rectal exam: Yes.  .  Confirms.              Anus:  normal sphincter tone, no lesions  Chaperone was present for exam.  Assessment:   Well woman visit with normal exam. Status post TAH/bilateral salpingectomy/excision of abdominal wall lipoma. Statu post laparoscopic right oophorectomy/ecisionof epiploic mass. Left ovary remains.   Plan: Mammogram screening discussed. Recommended self breast awareness. Pap and HR HPV as above. Guidelines for Calcium, Vitamin D, regular exercise program including cardiovascular and weight bearing exercise. Declines STD testing.  Discussed Gardasil.  She declines.  Follow up annually and prn.     After visit summary provided.

## 2018-02-24 NOTE — Patient Instructions (Signed)

## 2018-03-04 ENCOUNTER — Other Ambulatory Visit: Payer: Self-pay

## 2018-03-04 ENCOUNTER — Ambulatory Visit: Payer: 59 | Admitting: Family Medicine

## 2018-03-04 ENCOUNTER — Encounter: Payer: Self-pay | Admitting: Family Medicine

## 2018-03-04 VITALS — BP 136/62 | HR 82 | Temp 97.9°F | Resp 14 | Ht 65.25 in | Wt 188.0 lb

## 2018-03-04 DIAGNOSIS — J019 Acute sinusitis, unspecified: Secondary | ICD-10-CM

## 2018-03-04 DIAGNOSIS — I10 Essential (primary) hypertension: Secondary | ICD-10-CM

## 2018-03-04 MED ORDER — AMOXICILLIN-POT CLAVULANATE 875-125 MG PO TABS: 1.0000 | ORAL_TABLET | Freq: Two times a day (BID) | ORAL | 0 refills | Status: DC

## 2018-03-04 MED ORDER — FLUCONAZOLE 150 MG PO TABS: 150.0000 mg | ORAL_TABLET | Freq: Once | ORAL | 1 refills | Status: AC

## 2018-03-04 NOTE — Assessment & Plan Note (Signed)
BP improved, continue to monitor at home, continue clonidine and HCTZ

## 2018-03-04 NOTE — Patient Instructions (Addendum)
Call ENT  Take augmentin, hold on nasal steroid, use saline and anti-histamine  F/U 4 months PHYSICAL

## 2018-03-04 NOTE — Progress Notes (Signed)
   Subjective:    Patient ID: Mary Robles, female    DOB: [birth year], 42 y.o.   MRN: [id number]  Patient presents for Follow-up (is fasting) and Illness (x1 week- sinus pressure, nasal drainage, warmth to face, nose bleed)  HTN here for f/u HTN, taking HTCZ and clonidine , no concerns with medications   Pt here with recurrent sinus pressure drainage, nosebleeds, facial pain, no fever   Seen by ENT told needs septoplasty and turbinate reduction     Review Of Systems:  GEN- denies fatigue, fever, weight loss,weakness, recent illness HEENT- denies eye drainage, change in vision, +nasal discharge, CVS- denies chest pain, palpitations RESP- denies SOB, cough, wheeze Neuro- denies headache, dizziness, syncope, seizure activity       Objective:    BP 136/62   Pulse 82   Temp 97.9 F (36.6 C) (Oral)   Resp 14   Ht 5' 5.25" (1.657 m)   Wt 188 lb (85.3 kg)   LMP 04/17/2014   SpO2 99%   BMI 31.05 kg/m  GEN- NAD, alert and oriented x3 HEENT- PERRL, EOMI, non injected sclera, pink conjunctiva, MMM, oropharynxclear, TM clear bilat no effusion,  + mild maxillary sinus tenderness, inflammed turbinates,  Nasal drainage ,scab in right nares Neck- Supple, no LAD CVS- RRR, no murmur RESP-CTAB         Assessment & Plan:      Problem List Items Addressed This Visit      Unprioritized   Hypertension    BP improved, continue to monitor at home, continue clonidine and HCTZ       Other Visit Diagnoses    Acute rhinosinusitis    -  Primary   recurrent issues, recommend f/u ENT about need for surgery. Nasal saline, hold steroid due to bleeding, she can use afrin if severe bleed. augmentin, continue allergy meds, diflucan due to yeast infections   Relevant Medications   amoxicillin-clavulanate (AUGMENTIN) 875-125 MG tablet   fluconazole (DIFLUCAN) 150 MG tablet      Note: This dictation was prepared with Dragon dictation along with smaller phrase technology. Any  transcriptional errors that result from this process are unintentional.

## 2018-03-14 DIAGNOSIS — J342 Deviated nasal septum: Secondary | ICD-10-CM | POA: Diagnosis not present

## 2018-03-14 DIAGNOSIS — J302 Other seasonal allergic rhinitis: Secondary | ICD-10-CM | POA: Insufficient documentation

## 2018-03-14 DIAGNOSIS — J343 Hypertrophy of nasal turbinates: Secondary | ICD-10-CM | POA: Diagnosis not present

## 2018-03-31 ENCOUNTER — Encounter: Payer: Self-pay | Admitting: Family Medicine

## 2018-03-31 ENCOUNTER — Other Ambulatory Visit: Payer: Self-pay

## 2018-03-31 ENCOUNTER — Ambulatory Visit: Payer: 59 | Admitting: Family Medicine

## 2018-03-31 VITALS — BP 124/66 | HR 66 | Temp 98.4°F | Resp 12 | Ht 65.25 in | Wt 190.0 lb

## 2018-03-31 DIAGNOSIS — Z9109 Other allergy status, other than to drugs and biological substances: Secondary | ICD-10-CM

## 2018-03-31 DIAGNOSIS — T783XXD Angioneurotic edema, subsequent encounter: Secondary | ICD-10-CM

## 2018-03-31 MED ORDER — METHYLPREDNISOLONE ACETATE 40 MG/ML IJ SUSP
40.0000 mg | Freq: Once | INTRAMUSCULAR | Status: AC
  Administered 2018-03-31: 40 mg via INTRAMUSCULAR

## 2018-03-31 MED ORDER — FAMOTIDINE 20 MG PO TABS: 20.0000 mg | ORAL_TABLET | Freq: Two times a day (BID) | ORAL | 0 refills | Status: DC

## 2018-03-31 MED ORDER — PREDNISONE 10 MG PO TABS: ORAL_TABLET | ORAL | 0 refills | Status: DC

## 2018-03-31 NOTE — Progress Notes (Signed)
   Subjective:    Patient ID: Mary Robles, female    DOB: 01/14/1976, 42 y.o.   MRN: [id number]  Patient presents for Facial Edema (x1 night- itching to face, edema to lips and cheeks- took Benadryl last night- denies any new contacts)  Started yesterday with facial itching lips swelling   Took benadryl yesterday  Has had swelling on and off for past weeks, mostly in the morning  Using vaseline  No difficulty breathing, no new foods  Review Of Systems:  GEN- denies fatigue, fever, weight loss,weakness, recent illness HEENT- denies eye drainage, change in vision, nasal discharge, CVS- denies chest pain, palpitations RESP- denies SOB, cough, wheeze ABD- denies N/V, change in stools, abd pain GU- denies dysuria, hematuria, dribbling, incontinence MSK- denies joint pain, muscle aches, injury Neuro- denies headache, dizziness, syncope, seizure activity       Objective:    BP 124/66   Pulse 66   Temp 98.4 F (36.9 C) (Oral)   Resp 12   Ht 5' 5.25" (1.657 m)   Wt 190 lb (86.2 kg)   LMP 04/17/2014   SpO2 97%   BMI 31.38 kg/m  GEN- NAD, alert and oriented x3 HEENT- PERRL, EOMI, non injected sclera, pink conjunctiva, MMM, oropharynx clear, mild swelling of upper and lower lip, uvula midline Neck- Supple, noLAD CVS- RRR, no murmur RESP-CTAB         Assessment & Plan:      Problem List Items Addressed This Visit    None    Visit Diagnoses    Angioedema, subsequent encounter    -  Primary   unclear cause of recurrance off ACEI/ARB. given depo medrol, steroid taper, pepcid, allergy referral Benadryl as needed   Relevant Medications   methylPREDNISolone acetate (DEPO-MEDROL) injection 40 mg (Completed)   Other Relevant Orders   Ambulatory referral to Allergy   Environmental allergies       Relevant Orders   Ambulatory referral to Allergy      Note: This dictation was prepared with Dragon dictation along with smaller phrase technology. Any transcriptional errors  that result from this process are unintentional.

## 2018-03-31 NOTE — Patient Instructions (Addendum)
Steroid shot given Take prednisone as prescribed  Take pepcid twice a for 2 weeks  Benadryl  Referral to allergy  F/U as previous

## 2018-04-01 ENCOUNTER — Encounter: Payer: Self-pay | Admitting: Family Medicine

## 2018-04-11 ENCOUNTER — Other Ambulatory Visit: Payer: Self-pay | Admitting: Obstetrics and Gynecology

## 2018-04-11 ENCOUNTER — Telehealth: Payer: Self-pay | Admitting: Obstetrics and Gynecology

## 2018-04-11 DIAGNOSIS — Z1231 Encounter for screening mammogram for malignant neoplasm of breast: Secondary | ICD-10-CM

## 2018-04-11 NOTE — Telephone Encounter (Signed)
Spoke with patient. Reports she is having right breast tenderness. Denies any redness, swelling, or warmth to the breast. Advised will need to be seen in office for breast check. Patient is agreeable. Appointment scheduled for 04/14/2018 at 9:30 am with Dr.Silva. Patient is agreeable to date and time.  Routing to provider for final review. Patient agreeable to disposition. Will close encounter.

## 2018-04-11 NOTE — Telephone Encounter (Signed)
Patient called and said she is experiencing some right breast tenderness. She said she mentioned this when trying to schedule a mammogram and she was told to call our office.  Last seen: 02/24/18

## 2018-04-14 ENCOUNTER — Telehealth: Payer: Self-pay | Admitting: Obstetrics and Gynecology

## 2018-04-14 ENCOUNTER — Other Ambulatory Visit: Payer: Self-pay | Admitting: Family Medicine

## 2018-04-14 ENCOUNTER — Ambulatory Visit: Payer: 59 | Admitting: Obstetrics and Gynecology

## 2018-04-14 NOTE — Telephone Encounter (Signed)
Thank you for the update!

## 2018-04-14 NOTE — Progress Notes (Deleted)
GYNECOLOGY  VISIT   HPI: 42 y.o.   Legally Separated  African American  female   [phone] with Patient's last menstrual period was 04/17/2014.   here for     GYNECOLOGIC HISTORY: Patient's last menstrual period was 04/17/2014. Contraception:  Hysterectomy -- ovaries remain Menopausal hormone therapy:  none Last mammogram:  04/18/17 BIRADS 1 negative/density b Last pap smear:   01-21-14 Neg:Neg HR HPV        OB History    Gravida  4   Para  2   Term      Preterm      AB  1   Living  2     SAB      TAB  1   Ectopic      Multiple      Live Births                 Patient Active Problem List   Diagnosis Date Noted  . Obesity (BMI 30.0-34.9) 07/02/2017  . MDD (major depressive disorder) 07/16/2016  . Asthma, mild 04/11/2015  . GAD (generalized anxiety disorder) 02/08/2015  . Hyperlipidemia 07/09/2014  . Insomnia 07/09/2014  . Other malaise and fatigue 07/09/2014  . Nasal congestion 07/09/2014  . Urinary problem 07/09/2014  . Lipoma of abdominal wall s/p excision 05/04/2014 05/19/2014  . Status post total abdominal hysterectomy 05/04/2014  . Hypertension   . Vitamin D deficiency     Past Medical History:  Diagnosis Date  . Anxiety   . Asthma    no symptoms in "years", no inhaler  . Depression   . Herpes simplex   . Hyperlysinemia ([place])   . Hypertension   . Sickle cell trait ([place])   . Vitamin D deficiency     Past Surgical History:  Procedure Laterality Date  . ABDOMINAL HYSTERECTOMY N/A 05/04/2014   Procedure: HYSTERECTOMY ABDOMINAL with BILATERAL SALPINGECTOMY ;  Surgeon: Everardo All Amundson de Berton Lan, MD;  Location: [facility];  Service: Gynecology;  Laterality: N/A;  . BREAST BIOPSY Right Jan. 2016   benign fibroadenoma  . FOOT SURGERY    . LAPAROTOMY N/A 05/04/2014   Procedure: REMOVAL OF ABD WALL MASS ;  Surgeon: Adin Hector, MD;  Location: [facility];  Service: General;  Laterality: N/A;  . LIPOMA EXCISION N/A 05/04/2014   Procedure:  EXCISION LIPOMA of abdominal wall;  Surgeon: Jamey Reas de Berton Lan, MD;  Location: [facility];  Service: Gynecology;  Laterality: N/A;  . PELVIC LAPAROSCOPY Right 2018   oophorectomy  . TUBAL LIGATION      Current Outpatient Medications  Medication Sig Dispense Refill  . albuterol (PROVENTIL HFA;VENTOLIN HFA) 108 (90 Base) MCG/ACT inhaler Inhale 2 puffs into the lungs every 6 (six) hours as needed for wheezing or shortness of breath. 1 Inhaler 2  . cloNIDine (CATAPRES) 0.1 MG tablet TAKE 1 TABLET BY MOUTH TWICE DAILY 180 tablet 1  . famotidine (PEPCID) 20 MG tablet Take 1 tablet (20 mg total) by mouth 2 (two) times daily. 30 tablet 0  . fluticasone (FLONASE) 50 MCG/ACT nasal spray Place 2 sprays into the nose daily as needed for allergies or rhinitis.     . hydrochlorothiazide (HYDRODIURIL) 25 MG tablet Take 1 tablet (25 mg total) by mouth daily. 30 tablet 6  . levocetirizine (XYZAL) 5 MG tablet TAKE 1 TABLET BY MOUTH IN THE EVENING 90 tablet 3  . predniSONE (DELTASONE) 10 MG tablet Take 40mg  x 3 days,20mg  x 3  days, 10mg  x 3 days 21 tablet 0  . valACYclovir (VALTREX) 500 MG tablet Take 1 tablet (500 mg total) by mouth 2 (two) times daily. Take as needed for 3 days. 30 tablet 2  . Vitamin D, Ergocalciferol, (DRISDOL) 50000 units CAPS capsule Take 1 capsule (50,000 Units total) by mouth every 7 (seven) days. x6 months. 12 capsule 1  . vortioxetine HBr (TRINTELLIX) 20 MG TABS Take 20 mg by mouth daily. 90 tablet 2   No current facility-administered medications for this visit.      ALLERGIES: Vicodin [hydrocodone-acetaminophen]; Benazepril; and Betadine [povidone iodine]  Family History  Problem Relation Age of Onset  . Diabetes Mother   . Thyroid disease Mother   . Stroke Father   . Mental retardation Maternal Aunt   . Diabetes Maternal Grandmother     Social History   Socioeconomic History  . Marital status: Legally Separated    Spouse name: Not on file  . Number of  children: Not on file  . Years of education: Not on file  . Highest education level: Not on file  Occupational History  . Not on file  Social Needs  . Financial resource strain: Not on file  . Food insecurity:    Worry: Not on file    Inability: Not on file  . Transportation needs:    Medical: Not on file    Non-medical: Not on file  Tobacco Use  . Smoking status: Never Smoker  . Smokeless tobacco: Never Used  Substance and Sexual Activity  . Alcohol use: Yes    Alcohol/week: 0.0 oz    Comment: 2-3 x/year  . Drug use: No  . Sexual activity: Yes    Partners: Male    Birth control/protection: Surgical    Comment: BTSP 2005/TAH/BSO  Lifestyle  . Physical activity:    Days per week: Not on file    Minutes per session: Not on file  . Stress: Not on file  Relationships  . Social connections:    Talks on phone: Not on file    Gets together: Not on file    Attends religious service: Not on file    Active member of club or organization: Not on file    Attends meetings of clubs or organizations: Not on file    Relationship status: Not on file  . Intimate partner violence:    Fear of current or ex partner: Not on file    Emotionally abused: Not on file    Physically abused: Not on file    Forced sexual activity: Not on file  Other Topics Concern  . Not on file  Social History Narrative  . Not on file    ROS:  Pertinent items are noted in HPI.  PHYSICAL EXAMINATION:    LMP 04/17/2014     General appearance: alert, cooperative and appears stated age Head: Normocephalic, without obvious abnormality, atraumatic Neck: no adenopathy, supple, symmetrical, trachea midline and thyroid normal to inspection and palpation Lungs: clear to auscultation bilaterally Breasts: normal appearance, no masses or tenderness, No nipple retraction or dimpling, No nipple discharge or bleeding, No axillary or supraclavicular adenopathy Heart: regular rate and rhythm Abdomen: soft, non-tender,  no masses,  no organomegaly Extremities: extremities normal, atraumatic, no cyanosis or edema Skin: Skin color, texture, turgor normal. No rashes or lesions Lymph nodes: Cervical, supraclavicular, and axillary nodes normal. No abnormal inguinal nodes palpated Neurologic: Grossly normal  Pelvic: External genitalia:  no lesions  Urethra:  normal appearing urethra with no masses, tenderness or lesions              Bartholins and Skenes: normal                 Vagina: normal appearing vagina with normal color and discharge, no lesions              Cervix: no lesions                Bimanual Exam:  Uterus:  normal size, contour, position, consistency, mobility, non-tender              Adnexa: no mass, fullness, tenderness              Rectal exam: {yes no:314532}.  Confirms.              Anus:  normal sphincter tone, no lesions  Chaperone was present for exam.  ASSESSMENT     PLAN     An After Visit Summary was printed and given to the patient.  ______ minutes face to face time of which over 50% was spent in counseling.

## 2018-04-14 NOTE — Telephone Encounter (Signed)
Patient rescheduled her appointment today because she has a funeral to attend. Rescheduled to Wednesday 5/1.

## 2018-04-16 ENCOUNTER — Encounter: Payer: Self-pay | Admitting: Obstetrics and Gynecology

## 2018-04-16 ENCOUNTER — Ambulatory Visit: Payer: 59 | Admitting: Obstetrics and Gynecology

## 2018-04-16 ENCOUNTER — Other Ambulatory Visit: Payer: Self-pay

## 2018-04-16 VITALS — BP 138/80 | HR 80 | Resp 16 | Ht 66.0 in | Wt 189.0 lb

## 2018-04-16 DIAGNOSIS — N644 Mastodynia: Secondary | ICD-10-CM

## 2018-04-16 NOTE — Patient Instructions (Signed)

## 2018-04-16 NOTE — Progress Notes (Signed)
Patient scheduled while in office. Patient scheduled while in office for Bilateral Dx MMG with right breast US, if needed. Scheduled at [place] on 04/21/18 arriving at 7:50am for 8:10am appt. Patient verbalizes understanding and is agreeable.

## 2018-04-16 NOTE — Progress Notes (Signed)
GYNECOLOGY  VISIT   HPI: 42 y.o.   Legally Separated  African American  female   [phone] with Patient's last menstrual period was 04/17/2014.   here for right breast and nipple pain for 1 week per patient  Ibuprofen helps somewhat.   Hx right breast fibroadenoma.  No trauma.   GYNECOLOGIC HISTORY: Patient's last menstrual period was 04/17/2014. Contraception:  Hysterectomy Menopausal hormone therapy:  none Last mammogram:  04/18/17 BIRADS 1 negative/density b Last pap smear:   01-21-14 Neg:Neg HR HPV        OB History    Gravida  4   Para  2   Term      Preterm      AB  1   Living  2     SAB      TAB  1   Ectopic      Multiple      Live Births                 Patient Active Problem List   Diagnosis Date Noted  . Obesity (BMI 30.0-34.9) 07/02/2017  . MDD (major depressive disorder) 07/16/2016  . Asthma, mild 04/11/2015  . GAD (generalized anxiety disorder) 02/08/2015  . Hyperlipidemia 07/09/2014  . Insomnia 07/09/2014  . Other malaise and fatigue 07/09/2014  . Nasal congestion 07/09/2014  . Urinary problem 07/09/2014  . Lipoma of abdominal wall s/p excision 05/04/2014 05/19/2014  . Status post total abdominal hysterectomy 05/04/2014  . Hypertension   . Vitamin D deficiency     Past Medical History:  Diagnosis Date  . Anxiety   . Asthma    no symptoms in "years", no inhaler  . Depression   . Herpes simplex   . Hyperlysinemia ([place])   . Hypertension   . Sickle cell trait ([place])   . Vitamin D deficiency     Past Surgical History:  Procedure Laterality Date  . ABDOMINAL HYSTERECTOMY N/A 05/04/2014   Procedure: HYSTERECTOMY ABDOMINAL with BILATERAL SALPINGECTOMY ;  Surgeon: Everardo All Amundson de Berton Lan, MD;  Location: [facility];  Service: Gynecology;  Laterality: N/A;  . BREAST BIOPSY Right Jan. 2016   benign fibroadenoma  . FOOT SURGERY    . LAPAROTOMY N/A 05/04/2014   Procedure: REMOVAL OF ABD WALL MASS ;  Surgeon: Adin Hector, MD;   Location: [facility];  Service: General;  Laterality: N/A;  . LIPOMA EXCISION N/A 05/04/2014   Procedure: EXCISION LIPOMA of abdominal wall;  Surgeon: Jamey Reas de Berton Lan, MD;  Location: [facility];  Service: Gynecology;  Laterality: N/A;  . PELVIC LAPAROSCOPY Right 2018   oophorectomy  . TUBAL LIGATION      Current Outpatient Medications  Medication Sig Dispense Refill  . albuterol (PROVENTIL HFA;VENTOLIN HFA) 108 (90 Base) MCG/ACT inhaler Inhale 2 puffs into the lungs every 6 (six) hours as needed for wheezing or shortness of breath. 1 Inhaler 2  . cloNIDine (CATAPRES) 0.1 MG tablet TAKE 1 TABLET BY MOUTH TWICE DAILY 180 tablet 1  . fluticasone (FLONASE) 50 MCG/ACT nasal spray Place 2 sprays into the nose daily as needed for allergies or rhinitis.     . hydrochlorothiazide (HYDRODIURIL) 25 MG tablet Take 1 tablet (25 mg total) by mouth daily. 30 tablet 6  . levocetirizine (XYZAL) 5 MG tablet TAKE 1 TABLET BY MOUTH IN THE EVENING 90 tablet 3  . valACYclovir (VALTREX) 500 MG tablet Take 1 tablet (500 mg total) by mouth 2 (two) times  daily. Take as needed for 3 days. 30 tablet 2  . Vitamin D, Ergocalciferol, (DRISDOL) 50000 units CAPS capsule Take 1 capsule (50,000 Units total) by mouth every 7 (seven) days. x6 months. 12 capsule 1  . vortioxetine HBr (TRINTELLIX) 20 MG TABS Take 20 mg by mouth daily. 90 tablet 2   No current facility-administered medications for this visit.      ALLERGIES: Vicodin [hydrocodone-acetaminophen]; Benazepril; and Betadine [povidone iodine]  Family History  Problem Relation Age of Onset  . Diabetes Mother   . Thyroid disease Mother   . Stroke Father   . Mental retardation Maternal Aunt   . Diabetes Maternal Grandmother     Social History   Socioeconomic History  . Marital status: Legally Separated    Spouse name: Not on file  . Number of children: Not on file  . Years of education: Not on file  . Highest education level: Not on file   Occupational History  . Not on file  Social Needs  . Financial resource strain: Not on file  . Food insecurity:    Worry: Not on file    Inability: Not on file  . Transportation needs:    Medical: Not on file    Non-medical: Not on file  Tobacco Use  . Smoking status: Never Smoker  . Smokeless tobacco: Never Used  Substance and Sexual Activity  . Alcohol use: Yes    Alcohol/week: 0.0 oz    Comment: 2-3 x/year  . Drug use: No  . Sexual activity: Yes    Partners: Male    Birth control/protection: Surgical    Comment: BTSP 2005/TAH/BSO  Lifestyle  . Physical activity:    Days per week: Not on file    Minutes per session: Not on file  . Stress: Not on file  Relationships  . Social connections:    Talks on phone: Not on file    Gets together: Not on file    Attends religious service: Not on file    Active member of club or organization: Not on file    Attends meetings of clubs or organizations: Not on file    Relationship status: Not on file  . Intimate partner violence:    Fear of current or ex partner: Not on file    Emotionally abused: Not on file    Physically abused: Not on file    Forced sexual activity: Not on file  Other Topics Concern  . Not on file  Social History Narrative  . Not on file    ROS:  Pertinent items are noted in HPI.  PHYSICAL EXAMINATION:    BP 138/80 (BP Location: Right Arm, Patient Position: Sitting, Cuff Size: Normal)   Pulse 80   Resp 16   Ht 5\' 6"  (1.676 m)   Wt 189 lb (85.7 kg)   LMP 04/17/2014   BMI 30.51 kg/m     General appearance: alert, cooperative and appears stated age   Breasts: normal appearance, no masses or tenderness, No nipple retraction or dimpling, No nipple discharge or bleeding, No axillary or supraclavicular adenopathy   Chaperone was present for exam.  ASSESSMENT  Right breast pain.  Reassuring exam.  Hx fibroadenoma.  PLAN  Proceed with dx bilateral mammogram and right breast ultrasound.   Ibuprofen or vitamin E for breast pain.    An After Visit Summary was printed and given to the patient.  ___15___ minutes face to face time of which over 50% was spent in counseling.

## 2018-04-21 ENCOUNTER — Ambulatory Visit
Admission: RE | Admit: 2018-04-21 | Discharge: 2018-04-21 | Disposition: A | Discharge status: Home / Self Care | Payer: 59 | Source: Ambulatory Visit | Attending: Obstetrics and Gynecology | Admitting: Obstetrics and Gynecology

## 2018-04-21 DIAGNOSIS — N644 Mastodynia: Secondary | ICD-10-CM

## 2018-04-21 DIAGNOSIS — R928 Other abnormal and inconclusive findings on diagnostic imaging of breast: Secondary | ICD-10-CM | POA: Diagnosis not present

## 2018-05-06 ENCOUNTER — Ambulatory Visit: Payer: 59 | Admitting: Allergy and Immunology

## 2018-05-06 ENCOUNTER — Encounter: Payer: Self-pay | Admitting: Allergy and Immunology

## 2018-05-06 VITALS — BP 122/70 | HR 68 | Temp 97.6°F | Resp 16 | Ht 66.93 in | Wt 191.2 lb

## 2018-05-06 DIAGNOSIS — J3089 Other allergic rhinitis: Secondary | ICD-10-CM | POA: Diagnosis not present

## 2018-05-06 DIAGNOSIS — H1013 Acute atopic conjunctivitis, bilateral: Secondary | ICD-10-CM

## 2018-05-06 DIAGNOSIS — Z91018 Allergy to other foods: Secondary | ICD-10-CM | POA: Diagnosis not present

## 2018-05-06 DIAGNOSIS — H1045 Other chronic allergic conjunctivitis: Secondary | ICD-10-CM | POA: Diagnosis not present

## 2018-05-06 DIAGNOSIS — Z8709 Personal history of other diseases of the respiratory system: Secondary | ICD-10-CM

## 2018-05-06 MED ORDER — EPINEPHRINE 0.3 MG/0.3ML IJ SOAJ: 0.3000 mg | Freq: Once | INTRAMUSCULAR | 1 refills | Status: AC

## 2018-05-06 MED ORDER — OLOPATADINE HCL 0.2 % OP SOLN: 1.0000 [drp] | Freq: Every day | OPHTHALMIC | 5 refills | Status: DC | PRN

## 2018-05-06 NOTE — Progress Notes (Signed)
Dear Dr. Buelah Manis,  Thank you for referring Mary Robles to the [facility] on 05/06/2018.   Below is a summation of this patient's evaluation and recommendations.  Thank you for your referral. I will keep you informed about this patient's response to treatment.   If you have any questions please do not hesitate to contact me.   Sincerely,  Jiles Prows, MD Allergy / Immunology [place]   ______________________________________________________________________    NEW PATIENT NOTE  Referring Provider: Alycia Rossetti, MD Primary Provider: Alycia Rossetti, MD Date of office visit: 05/06/2018    Subjective:   Chief Complaint:  Mary Robles (DOB: April 06, 1976) is a 42 y.o. female who presents to the clinic on 05/06/2018 with a chief complaint of Allergic Reaction (lips dry and cracked, swollen, facial itching); Sinus Problem (deviated septum, stuffy nose, sinus headache); and Asthma (very controlled) .     HPI: Mary Robles presents to this clinic in evaluation of 3 issues.  First, apparently 2 months ago she developed an episode of facial and lip swelling believed secondary to benazepril which has since been discontinued.  She never had any associated systemic or constitutional symptoms or a trigger other than ACE inhibitor use that gave rise to that reaction.  Second, 1 month ago she developed isolated lip swelling.  In this case 2 days after starting systemic steroids her lips started to scale.  Her lips were itchy and she had some perioral itching as well but no other associated systemic or constitutional symptoms.  It took approximately 1 week for her lips to completely heal up.  There was no obvious trigger giving rise to this reaction.  She did not take any over-the-counter medications nor start a new prescription medicine nor have a significant environmental change.  Third, she has  "allergies".  She basically complains about having itchy eyes on a perennial basis without any trigger.  As well, she has "sinus infections" manifested as nasal congestion and anosmia and pain across her face about 3 times per year for which she visited with Dr. Wilburn Cornelia, ENT, who is going to perform what sounds like repair of deviated septum at some point in the next several weeks.  There is no obvious provoking factor giving rise to these issues.  She does not have associated fever or ugly nasal discharge and there is not a tremendous amount of sneezing associated with this issue.  She may have had some cough with this issue and she has been given an albuterol inhaler in the past which she rarely uses.  The history of exercise-induced bronchospastic symptoms are cold air induced bronchospastic symptoms for the past several years.  She rarely uses any Flonase averaging out to just a few times per month.  Past Medical History:  Diagnosis Date  . Angio-edema   . Anxiety   . Asthma    no symptoms in "years", no inhaler  . Depression   . Herpes simplex   . 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 Hypertension   . Sickle cell trait ([place])   . Vitamin D deficiency     Past Surgical History:  Procedure Laterality Date  . ABDOMINAL HYSTERECTOMY N/A 05/04/2014   Procedure: HYSTERECTOMY ABDOMINAL with BILATERAL SALPINGECTOMY ;  Surgeon: Everardo All Amundson de Berton Lan, MD;  Location: [facility];  Service: Gynecology;  Laterality: N/A;  . BREAST BIOPSY Right Jan. 2016   benign fibroadenoma  .  FOOT SURGERY    . LAPAROTOMY N/A 05/04/2014   Procedure: REMOVAL OF ABD WALL MASS ;  Surgeon: Adin Hector, MD;  Location: [facility];  Service: General;  Laterality: N/A;  . LIPOMA EXCISION N/A 05/04/2014   Procedure: EXCISION LIPOMA of abdominal wall;  Surgeon: Jamey Reas de Berton Lan, MD;  Location: [facility];  Service: Gynecology;  Laterality: N/A;  . PELVIC LAPAROSCOPY Right 2018   oophorectomy  . TUBAL LIGATION       Allergies as of 05/06/2018      Reactions   Vicodin [hydrocodone-acetaminophen] Other (See Comments)   Hallucination   Benazepril    Betadine [povidone Iodine]    Reaction:Blisters      Medication List      albuterol 108 (90 Base) MCG/ACT inhaler Commonly known as:  PROVENTIL HFA;VENTOLIN HFA Inhale 2 puffs into the lungs every 6 (six) hours as needed for wheezing or shortness of breath.   cloNIDine 0.1 MG tablet Commonly known as:  CATAPRES TAKE 1 TABLET BY MOUTH TWICE DAILY   fluticasone 50 MCG/ACT nasal spray Commonly known as:  FLONASE Place 2 sprays into the nose daily as needed for allergies or rhinitis.   hydrochlorothiazide 25 MG tablet Commonly known as:  HYDRODIURIL Take 1 tablet (25 mg total) by mouth daily.   levocetirizine 5 MG tablet Commonly known as:  XYZAL TAKE 1 TABLET BY MOUTH IN THE EVENING   valACYclovir 500 MG tablet Commonly known as:  VALTREX Take 1 tablet (500 mg total) by mouth 2 (two) times daily. Take as needed for 3 days.   Vitamin D (Ergocalciferol) 50000 units Caps capsule Commonly known as:  DRISDOL Take 1 capsule (50,000 Units total) by mouth every 7 (seven) days. x6 months.   vortioxetine HBr 20 MG Tabs tablet Commonly known as:  TRINTELLIX Take 20 mg by mouth daily.       Review of systems negative except as noted in HPI / PMHx or noted below:  Review of Systems  Constitutional: Negative.   HENT: Negative.   Eyes: Negative.   Respiratory: Negative.   Cardiovascular: Negative.   Gastrointestinal: Negative.   Genitourinary: Negative.   Musculoskeletal: Negative.   Skin: Negative.   Neurological: Negative.   Endo/Heme/Allergies: Negative.   Psychiatric/Behavioral: Negative.     Family History  Problem Relation Age of Onset  . Diabetes Mother   . Thyroid disease Mother   . Stroke Father   . Mental retardation Maternal Aunt   . Diabetes Maternal Grandmother     Social History   Socioeconomic History  .  Marital status: Legally Separated    Spouse name: Not on file  . Number of children: Not on file  . Years of education: Not on file  . Highest education level: Not on file  Occupational History  . Not on file  Social Needs  . Financial resource strain: Not on file  . Food insecurity:    Worry: Not on file    Inability: Not on file  . Transportation needs:    Medical: Not on file    Non-medical: Not on file  Tobacco Use  . Smoking status: Never Smoker  . Smokeless tobacco: Never Used  Substance and Sexual Activity  . Alcohol use: Yes    Alcohol/week: 0.0 oz    Comment: 2-3 x/year  . Drug use: No  . Sexual activity: Yes    Partners: Male    Birth control/protection: Surgical    Comment: BTSP 2005/TAH/BSO  Lifestyle  . Physical activity:    Days per week: Not on file    Minutes per session: Not on file  . Stress: Not on file  Relationships  . Social connections:    Talks on phone: Not on file    Gets together: Not on file    Attends religious service: Not on file    Active member of club or organization: Not on file    Attends meetings of clubs or organizations: Not on file    Relationship status: Not on file  . Intimate partner violence:    Fear of current or ex partner: Not on file    Emotionally abused: Not on file    Physically abused: Not on file    Forced sexual activity: Not on file  Other Topics Concern  . Not on file  Social History Narrative  . Not on file    Environmental and Social history  Lives in a townhouse with a dry environment, no animals located inside the household, plastic on the bed, plastic on the pillow, and no smokers located inside the household.  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  Objective:   Vitals:   05/06/18 0817  BP: 122/70  Pulse: 68  Resp: 16  Temp: 97.6 F (36.4 C)   Height: 5' 6.93" (170 cm) Weight: 191 lb 3.2 oz (86.7 kg)  Physical Exam  HENT:  Head: Normocephalic.  Head is without right periorbital erythema and without left periorbital erythema.  Right Ear: Tympanic membrane, external ear and ear canal normal.  Left Ear: Tympanic membrane, external ear and ear canal normal.  Nose: Mucosal edema present. No rhinorrhea.  Mouth/Throat: Uvula is midline, oropharynx is clear and moist and mucous membranes are normal. No oropharyngeal exudate.  Eyes: Pupils are equal, round, and reactive to light. Conjunctivae and lids are normal.  Neck: Trachea normal. No tracheal tenderness present. No tracheal deviation present. No thyromegaly present.  Cardiovascular: Normal rate, regular rhythm, S1 normal, S2 normal and normal heart sounds.  No murmur heard. Pulmonary/Chest: Effort normal and breath sounds normal. No stridor. No respiratory distress. She has no wheezes. She has no rales. She exhibits no tenderness.  Abdominal: Soft. She exhibits no distension and no mass. There is no hepatosplenomegaly. There is no tenderness. There is no rebound and no guarding.  Musculoskeletal: She exhibits no edema or tenderness.  Lymphadenopathy:       Head (right side): No tonsillar adenopathy present.       Head (left side): No tonsillar adenopathy present.    She has no cervical adenopathy.    She has no axillary adenopathy.  Neurological: She is alert.  Skin: No rash noted. She is not diaphoretic. No erythema. No pallor. Nails show no clubbing.    Diagnostics: Allergy skin tests were performed.  She demonstrated hypersensitivity to house dust mite, weeds, and trees.  She also demonstrated severe hypersensitivity against [place] nut and fish.  Spirometry was performed and demonstrated an FEV1 of 2.72 @ 98 % of predicted. FEV1/FVC = 0.80  Review blood tests obtained 03 January 2018 identified normal hepatic and renal function, WBC 8.2, absolute eosinophil 189, absolute lymphocyte 2780, hemoglobin 13.7, platelet 353.  Results of a sinus CT scan obtained 26 December 2016 identified  the following:  Mastoid air cells and middle ear cavities clear bilaterally. Paranasal sinuses clear. Specifically no frontal sinus opacification, mucosal thickening or osseous thickening identified. No sinus opacification or  air-fluid levels. Minimal nasal septal deviation to the LEFT. Large dense calcification within the anterior falx. Visualized intracranial structures otherwise unremarkable.  Assessment and Plan:    1. Perennial allergic conjunctivitis of both eyes   2. Other allergic rhinitis   3. Food allergy   4. History of asthma     1. Allergen avoidance measures - avoid ACE inhibitor  2. Treat and prevent inflammation:   A. OTC Nasacort - 1 spray each nostril 1-7 times per week  3. If needed:   A. Xyzal 5mg  - 1 tablet one time per day (dry eye?)  B.  Pataday -1 drop each eye 1 time per day  C.  Systane eyedrops multiple times a day  D. Auvi-Q 0.3, benadryl, MD/ER evaluation  4. Contact clinic if further reactions  5. Return to clinic if treatment inadequate  6. Blood - Nut panel, fish panel  Robbyn appears to have had a significant reaction involving her lips with some degree of inflammation giving the scaling that developed as a result of this reaction.  She did demonstrate significant hypersensitivity against nut and fish and we will further evaluate this issue with the blood tests noted above.  To er on the side of safety we will provide her an injectable epinephrine device.  She also appears to have some degree of atopic disease affecting her conjunctiva and upper airway and we will treat her with a combination of allergen avoidance measures and the use of anti-inflammatory agents for her airway as noted above.  I will contact her with the results of her blood test once it is available for review.  Jiles Prows, MD Allergy / Immunology [facility]

## 2018-05-06 NOTE — Patient Instructions (Addendum)
  1. Allergen avoidance measures - avoid ACE inhibitor  2. Treat and prevent inflammation:   A. OTC Nasacort - 1 spray each nostril 1-7 times per week  3. If needed:   A. Xyzal 5mg  - 1 tablet one time per day (dry eye?)  B.  Pataday -1 drop each eye 1 time per day  C.  Systane eyedrops multiple times a day  D. Auvi-Q 0.3, benadryl, MD/ER evaluation  4. Contact clinic if further reactions  5. Return to clinic if treatment inadequate  6. Blood - Nut panel, fish panel

## 2018-05-07 ENCOUNTER — Telehealth: Payer: Self-pay

## 2018-05-07 ENCOUNTER — Encounter: Payer: Self-pay | Admitting: Allergy and Immunology

## 2018-05-07 NOTE — Addendum Note (Signed)
Addended by: Arvid Right on: 05/07/2018 08:46 AM   Modules accepted: Orders

## 2018-05-07 NOTE — Telephone Encounter (Signed)
Verbal order per Dr. Neldon Mc - called [facility] to add Fish Panel.

## 2018-05-13 LAB — ALLERGENS(7)
BRAZIL NUT IGE: 0.17 kU/L — AB
F020-IgE Almond: <0.1 kU/L
Pecan Nut IgE: <0.1 kU/L
Walnut IgE: <0.1 kU/L

## 2018-05-13 LAB — ALLERGEN PROFILE, FOOD-FISH
Allergen Mackerel IgE: <0.1 kU/L
Allergen Trout IgE: <0.1 kU/L
Codfish IgE: <0.1 kU/L

## 2018-05-13 LAB — SPECIMEN STATUS REPORT

## 2018-05-21 DIAGNOSIS — J343 Hypertrophy of nasal turbinates: Secondary | ICD-10-CM | POA: Diagnosis not present

## 2018-05-21 DIAGNOSIS — J342 Deviated nasal septum: Secondary | ICD-10-CM | POA: Diagnosis not present

## 2018-07-07 ENCOUNTER — Ambulatory Visit (INDEPENDENT_AMBULATORY_CARE_PROVIDER_SITE_OTHER): Payer: 59 | Admitting: Family Medicine

## 2018-07-07 ENCOUNTER — Other Ambulatory Visit: Payer: Self-pay

## 2018-07-07 ENCOUNTER — Encounter: Payer: Self-pay | Admitting: Family Medicine

## 2018-07-07 VITALS — BP 134/84 | HR 90 | Temp 98.3°F | Resp 14 | Ht 66.0 in | Wt 188.0 lb

## 2018-07-07 DIAGNOSIS — F322 Major depressive disorder, single episode, severe without psychotic features: Secondary | ICD-10-CM

## 2018-07-07 DIAGNOSIS — E559 Vitamin D deficiency, unspecified: Secondary | ICD-10-CM | POA: Diagnosis not present

## 2018-07-07 DIAGNOSIS — E782 Mixed hyperlipidemia: Secondary | ICD-10-CM | POA: Diagnosis not present

## 2018-07-07 DIAGNOSIS — E669 Obesity, unspecified: Secondary | ICD-10-CM

## 2018-07-07 DIAGNOSIS — F411 Generalized anxiety disorder: Secondary | ICD-10-CM

## 2018-07-07 DIAGNOSIS — Z0000 Encounter for general adult medical examination without abnormal findings: Secondary | ICD-10-CM

## 2018-07-07 DIAGNOSIS — I10 Essential (primary) hypertension: Secondary | ICD-10-CM | POA: Diagnosis not present

## 2018-07-07 MED ORDER — EPINEPHRINE 0.3 MG/0.3ML IJ SOAJ: 0.3000 mg | Freq: Once | INTRAMUSCULAR | 0 refills | Status: AC

## 2018-07-07 NOTE — Patient Instructions (Addendum)
Take vitamin D  Continue all other medication  We will call with lab results  F/U 6 months

## 2018-07-07 NOTE — Assessment & Plan Note (Signed)
Working on diet and weight loss

## 2018-07-07 NOTE — Assessment & Plan Note (Signed)
Continue trintellex, symptoms and sleep has improved

## 2018-07-07 NOTE — Assessment & Plan Note (Signed)
She has missed multiple weekly doses, not taking anything currently

## 2018-07-07 NOTE — Assessment & Plan Note (Signed)
Blood pressure improved no changes

## 2018-07-07 NOTE — Progress Notes (Signed)
   Subjective:    Patient ID: Mary Robles, female    DOB: 1976/05/28, 42 y.o.   MRN: [id number]  Patient presents for CPE (is fasting)   Pt here for CPE, medications and history reviewed Here with boyfriend Job is still quite stressful, but she has been up to date with her caseload   GYN- Dr. Quincy Simmonds, Mammogram UTD  s/p hysterectomy  Due for fasting labs and repeat Vitamin D level FOllowed by allergist as well - recently found out she is allergic to [place] nuts    HTN- taking BP meds as prescribed   CAGE score - negative    MDD- taking trintellix as prescribed, PHQ 9 scpre 7, Melatonin caused hallucinations, so she has stopped  only difficulty sleeping on Sunday night    TDAP/FLU UTD  Review Of Systems:  GEN- denies fatigue, fever, weight loss,weakness, recent illness HEENT- denies eye drainage, change in vision, nasal discharge, CVS- denies chest pain, palpitations RESP- denies SOB, cough, wheeze ABD- denies N/V, change in stools, abd pain GU- denies dysuria, hematuria, dribbling, incontinence MSK- denies joint pain, muscle aches, injury Neuro- denies headache, dizziness, syncope, seizure activity       Objective:    BP 134/84   Pulse 90   Temp 98.3 F (36.8 C) (Oral)   Resp 14   Ht 5\' 6"  (1.676 m)   Wt 188 lb (85.3 kg)   LMP 04/17/2014   SpO2 95%   BMI 30.34 kg/m  GEN- NAD, alert and oriented x3 HEENT- PERRL, EOMI, non injected sclera, pink conjunctiva, MMM, oropharynx clear, Tm clear bilat, no effusion Neck- Supple, no thyromegaly CVS- RRR, no murmur RESP-CTAB ABD-NABS,soft,NT,ND EXT- No edema Pulses- Radial, DP- 2+        Assessment & Plan:      Problem List Items Addressed This Visit      Unprioritized   GAD (generalized anxiety disorder)   Hyperlipidemia    Recheck lipids       Relevant Medications   EPINEPHrine 0.3 mg/0.3 mL IJ SOAJ injection   Other Relevant Orders   Lipid panel   Hypertension    Blood pressure improved no changes        Relevant Medications   EPINEPHrine 0.3 mg/0.3 mL IJ SOAJ injection   Other Relevant Orders   Comprehensive metabolic panel   MDD (major depressive disorder)    Continue trintellex, symptoms and sleep has improved       Obesity (BMI 30.0-34.9)    Working on diet and weight loss      Vitamin D deficiency    She has missed multiple weekly doses, not taking anything currently       Relevant Orders   Vitamin D, 25-hydroxy    Other Visit Diagnoses    Routine general medical examination at a health care facility    -  Primary   CPE done, fasting labs, immunizations UTD,prevention UTD   Relevant Orders   CBC with Differential/Platelet   Comprehensive metabolic panel   Lipid panel      Note: This dictation was prepared with Dragon dictation along with smaller phrase technology. Any transcriptional errors that result from this process are unintentional.

## 2018-07-07 NOTE — Assessment & Plan Note (Signed)
Recheck lipids

## 2018-07-08 LAB — CBC WITH DIFFERENTIAL/PLATELET
Basophils Absolute: 47 cells/uL (ref 0–200)
Basophils Relative: 0.6 %
EOS PCT: 3.2 %
Eosinophils Absolute: 250 cells/uL (ref 15–500)
HEMATOCRIT: 41.5 % (ref 35.0–45.0)
Hemoglobin: 14 g/dL (ref 11.7–15.5)
LYMPHS ABS: 2707 {cells}/uL (ref 850–3900)
MCH: 27.8 pg (ref 27.0–33.0)
MCHC: 33.7 g/dL (ref 32.0–36.0)
MCV: 82.3 fL (ref 80.0–100.0)
MPV: 10.9 fL (ref 7.5–12.5)
Monocytes Relative: 7.2 %
NEUTROS PCT: 54.3 %
Neutro Abs: 4235 cells/uL (ref 1500–7800)
PLATELETS: 323 10*3/uL (ref 140–400)
RBC: 5.04 10*6/uL (ref 3.80–5.10)
RDW: 14.5 % (ref 11.0–15.0)
TOTAL LYMPHOCYTE: 34.7 %
WBC mixed population: 562 cells/uL (ref 200–950)
WBC: 7.8 10*3/uL (ref 3.8–10.8)

## 2018-07-08 LAB — COMPREHENSIVE METABOLIC PANEL
AG Ratio: 1.5 (calc) (ref 1.0–2.5)
ALBUMIN MSPROF: 4 g/dL (ref 3.6–5.1)
ALT: 19 U/L (ref 6–29)
AST: 21 U/L (ref 10–30)
Alkaline phosphatase (APISO): 41 U/L (ref 33–115)
BUN: 10 mg/dL (ref 7–25)
CO2: 27 mmol/L (ref 20–32)
CREATININE: 0.85 mg/dL (ref 0.50–1.10)
Calcium: 9.2 mg/dL (ref 8.6–10.2)
Chloride: 101 mmol/L (ref 98–110)
GLUCOSE: 91 mg/dL (ref 65–99)
Globulin: 2.7 g/dL (calc) (ref 1.9–3.7)
POTASSIUM: 3.5 mmol/L (ref 3.5–5.3)
SODIUM: 138 mmol/L (ref 135–146)
TOTAL PROTEIN: 6.7 g/dL (ref 6.1–8.1)
Total Bilirubin: 0.6 mg/dL (ref 0.2–1.2)

## 2018-07-08 LAB — LIPID PANEL
CHOL/HDL RATIO: 4.5 (calc) (ref <5.0)
Cholesterol: 187 mg/dL (ref <200)
HDL: 42 mg/dL — ABNORMAL LOW (ref >50)
LDL CHOLESTEROL (CALC): 123 mg/dL — AB
Non-HDL Cholesterol (Calc): 145 mg/dL (calc) — ABNORMAL HIGH (ref <130)
Triglycerides: 116 mg/dL (ref <150)

## 2018-07-08 LAB — VITAMIN D 25 HYDROXY (VIT D DEFICIENCY, FRACTURES): VIT D 25 HYDROXY: 26 ng/mL — AB (ref 30–100)

## 2018-07-22 ENCOUNTER — Encounter: Payer: Self-pay | Admitting: Family Medicine

## 2018-07-22 ENCOUNTER — Ambulatory Visit: Payer: 59 | Admitting: Family Medicine

## 2018-07-22 VITALS — BP 130/90 | HR 101 | Temp 98.8°F | Resp 18 | Ht 66.0 in | Wt 190.8 lb

## 2018-07-22 DIAGNOSIS — J329 Chronic sinusitis, unspecified: Secondary | ICD-10-CM

## 2018-07-22 DIAGNOSIS — R079 Chest pain, unspecified: Secondary | ICD-10-CM

## 2018-07-22 DIAGNOSIS — H6122 Impacted cerumen, left ear: Secondary | ICD-10-CM

## 2018-07-22 DIAGNOSIS — R9431 Abnormal electrocardiogram [ECG] [EKG]: Secondary | ICD-10-CM | POA: Diagnosis not present

## 2018-07-22 DIAGNOSIS — J4521 Mild intermittent asthma with (acute) exacerbation: Secondary | ICD-10-CM

## 2018-07-22 MED ORDER — BENZONATATE 100 MG PO CAPS: 100.0000 mg | ORAL_CAPSULE | Freq: Three times a day (TID) | ORAL | 0 refills | Status: DC | PRN

## 2018-07-22 MED ORDER — DOXYCYCLINE HYCLATE 100 MG PO TABS: 100.0000 mg | ORAL_TABLET | Freq: Two times a day (BID) | ORAL | 0 refills | Status: AC

## 2018-07-22 MED ORDER — ALBUTEROL SULFATE HFA 108 (90 BASE) MCG/ACT IN AERS: 2.0000 | INHALATION_SPRAY | Freq: Four times a day (QID) | RESPIRATORY_TRACT | 2 refills | Status: DC | PRN

## 2018-07-22 MED ORDER — PREDNISONE 20 MG PO TABS: 40.0000 mg | ORAL_TABLET | Freq: Every day | ORAL | 0 refills | Status: AC

## 2018-07-22 NOTE — Patient Instructions (Signed)
Chest symptoms may be early bronchitis, but is definitely and upper respiratory infection.  With your history of asthma, usually cover with steroids, albuterol, cough meds.  Sinus pain and pressure with chest symptoms, if they worsen with fever or more severe pain, start the antibiotics.  It covers bacterial sinus infections and atypical lung infections.

## 2018-07-22 NOTE — Progress Notes (Signed)
Patient ID: Mary Robles, female    DOB: September 28, 1976, 42 y.o.   MRN: [id number]  PCP: Alycia Rossetti, MD  Chief Complaint  Patient presents with  . Chest Pain    symptoms since sunday   . Headache  . left ear pain    Subjective:   Mary Robles is a 42 y.o. female, presents to clinic with CC of headaches with associated nasal congestion and sinus pain and pressure and chest pressure with some tightness in her upper central chest with associated exertional shortness of breath for 2 days.   She is concerned that she has an upper respiratory infection that is beginning to go into her chest and she is concerned about bronchitis.  She states that she has not having any wheeze.  The chest "pressure" described, she says is not consistent with past asthma exacerbations.  When asking about asthma hx and frequency, she states that she "has not thought about her asthma" and has not used her inhaler when short of breath, even with minor activity.  States that she previously used her inhalers when going up and down the stairs at work and when short of breath.  She rarely uses SABA, has infrequent exacerbations, has never been admitted for asthma or intubated.  She endorses associated left ear pain. She does not think that her described "chest pressure" is her heart and she denies any orthopnea, PND, palpitations, near-syncope. She denies ear swelling, redness, drainage, sore throat, night sweats, fever, weightloss.  HA is described as pressure and congestion, moderate to severe, consistent, worsening, located across sinuses, forehead and temples.  Patient Active Problem List   Diagnosis Date Noted  . Obesity (BMI 30.0-34.9) 07/02/2017  . MDD (major depressive disorder) 07/16/2016  . Asthma, mild 04/11/2015  . GAD (generalized anxiety disorder) 02/08/2015  . Hyperlipidemia 07/09/2014  . Insomnia 07/09/2014  . Lipoma of abdominal wall s/p excision 05/04/2014 05/19/2014  . Status post total abdominal  hysterectomy 05/04/2014  . Hypertension   . Vitamin D deficiency      Prior to Admission medications   Medication Sig Start Date End Date Taking? Authorizing Provider  albuterol (PROVENTIL HFA;VENTOLIN HFA) 108 (90 Base) MCG/ACT inhaler Inhale 2 puffs into the lungs every 6 (six) hours as needed for wheezing or shortness of breath. 10/01/17  Yes Young, Vanessa [place], PA-C  cloNIDine (CATAPRES) 0.1 MG tablet TAKE 1 TABLET BY MOUTH TWICE DAILY 04/14/18  Yes [place], Modena Nunnery, MD  fluticasone [facility]) 50 MCG/ACT nasal spray Place 2 sprays into the nose daily as needed for allergies or rhinitis.  01/22/17  Yes [provider]  hydrochlorothiazide (HYDRODIURIL) 25 MG tablet Take 1 tablet (25 mg total) by mouth daily. 02/21/18  Yes [place], Modena Nunnery, MD  levocetirizine (XYZAL) 5 MG tablet TAKE 1 TABLET BY MOUTH IN THE EVENING 08/14/17  Yes [place], Modena Nunnery, MD  valACYclovir (VALTREX) 500 MG tablet Take 1 tablet (500 mg total) by mouth 2 (two) times daily. Take as needed for 3 days. 02/24/18  Yes Amundson Raliegh Ip, MD  vortioxetine HBr (TRINTELLIX) 20 MG TABS Take 20 mg by mouth daily. 01/03/18  Yes [place], Modena Nunnery, MD     Allergies  Allergen Reactions  . Vicodin [Hydrocodone-Acetaminophen] Other (See Comments)    Hallucination  . Benazepril   . Betadine [Povidone Iodine]     Reaction:Blisters  . Other     [place] nuts     Family hx and SH reviewed  Review of Systems  Eyes: Negative.   Respiratory: Positive for chest tightness and shortness of breath.   Cardiovascular: Positive for chest pain.  Gastrointestinal: Negative.   Endocrine: Negative.   Genitourinary: Negative.   Musculoskeletal: Negative.   Skin: Negative.   Allergic/Immunologic: Negative.   Neurological: Positive for headaches. Negative for tremors, syncope, weakness and light-headedness.  Hematological: Negative.        Objective:    Vitals:   07/22/18 1524  BP: 130/90  Pulse: (!) 101    Resp: 18  Temp: 98.8 F (37.1 C)  TempSrc: Oral  SpO2: 98%  Weight: 190 lb 12.8 oz (86.5 kg)  Height: 5\' 6"  (1.676 m)      Physical Exam  Constitutional: She appears well-developed and well-nourished.  Non-toxic appearance. She does not appear ill. No distress.  HENT:  Head: Normocephalic and atraumatic.  Right Ear: Hearing, tympanic membrane, external ear and ear canal normal.  Nose: Mucosal edema, rhinorrhea and sinus tenderness present. No nasal deformity. No epistaxis.  Mouth/Throat: Oropharynx is clear and moist.  Left TM occluded with cerumen Visible portion of left canal normal, external left ear normal   Eyes: Pupils are equal, round, and reactive to light. EOM are normal.  Neck: Normal range of motion. Neck supple. No tracheal deviation present.  Cardiovascular: Regular rhythm, intact distal pulses and normal pulses. Tachycardia present. PMI is not displaced. Exam reveals no gallop and no friction rub.  No murmur heard. Pulses:      Radial pulses are 2+ on the right side, and 2+ on the left side.  Pulmonary/Chest: Effort normal and breath sounds normal. No accessory muscle usage or stridor. No tachypnea. No respiratory distress. She has no decreased breath sounds. She has no wheezes. She has no rhonchi. She has no rales.  Abdominal: Soft. Bowel sounds are normal. She exhibits no distension.  Lymphadenopathy:    She has no cervical adenopathy.  Neurological: She is alert.  Skin: Skin is warm. Capillary refill takes less than 2 seconds. No rash noted. No cyanosis. No pallor.  Psychiatric: Her mood appears anxious. She is not agitated.  Nursing note and vitals reviewed.   EKG: sinus rhythm, TWI V4-V6    Procedure: Cerumen Disimpaction  Gentle ear lavage with warm water and hydrogen peroxide performed on the left. There were no complications and following the disimpaction the tympanic membrane was visible. Tympanic membranes are intact following the procedure.  Auditory  canals are normal appearing, no erythema, no edema.  The patient reported complete relief  of symptoms after removal of cerumen.      Assessment & Plan:      ICD-10-CM   1. Chest pain, unspecified type R07.9 EKG 12-Lead   EKG - TWI lead V4-V6, will send to Cardiology to f/up chest sx are likely from URI/bronchitis/asthma and pt states stress with job  2. Left ear impacted cerumen H61.22    successful irrigation  3. Sinusitis, unspecified chronicity, unspecified location J32.9 predniSONE (DELTASONE) 20 MG tablet    doxycycline (VIBRA-TABS) 100 MG tablet  4. T wave inversion in EKG R94.31 Ambulatory referral to Cardiology  5. Mild intermittent asthmatic bronchitis with acute exacerbation J45.21 predniSONE (DELTASONE) 20 MG tablet    benzonatate (TESSALON) 100 MG capsule    albuterol (PROVENTIL HFA;VENTOLIN HFA) 108 (90 Base) MCG/ACT inhaler    Tx acute bronchitis with hx of asthma and sinusitis with allergy meds, prednisone burst, SABA and tessalon.  Sinusitis-  does have TTP on exam, recent surgery, feel  antibiotics are indicated to cover acute bacterial sinusitis, doxy x 7 d  Chest pain sx seem mostly respiratory in nature, likely bronchitis.     EKG completed has normal sinus rhythm, decreased waveform amplitudes similar to prior EKG's however new TWI in V4-V6 will send to cardiology for eval.  Doubt ACS, however ER precautions reviewed and pt verbalized understanding.    Delsa Grana, PA-C 07/22/18 3:34 PM

## 2018-09-01 ENCOUNTER — Other Ambulatory Visit: Payer: Self-pay

## 2018-09-01 ENCOUNTER — Encounter (HOSPITAL_COMMUNITY): Payer: Self-pay | Admitting: Emergency Medicine

## 2018-09-01 ENCOUNTER — Emergency Department (HOSPITAL_COMMUNITY): Payer: 59

## 2018-09-01 ENCOUNTER — Telehealth: Payer: Self-pay | Admitting: *Deleted

## 2018-09-01 ENCOUNTER — Emergency Department (HOSPITAL_COMMUNITY)
Admission: EM | Admit: 2018-09-01 | Discharge: 2018-09-02 | Disposition: A | Discharge status: Home / Self Care | Payer: 59 | Attending: Emergency Medicine | Admitting: Emergency Medicine

## 2018-09-01 DIAGNOSIS — Z79899 Other long term (current) drug therapy: Secondary | ICD-10-CM | POA: Diagnosis not present

## 2018-09-01 DIAGNOSIS — I160 Hypertensive urgency: Secondary | ICD-10-CM | POA: Diagnosis not present

## 2018-09-01 DIAGNOSIS — R51 Headache: Secondary | ICD-10-CM | POA: Diagnosis not present

## 2018-09-01 DIAGNOSIS — I10 Essential (primary) hypertension: Secondary | ICD-10-CM | POA: Insufficient documentation

## 2018-09-01 DIAGNOSIS — R079 Chest pain, unspecified: Secondary | ICD-10-CM | POA: Diagnosis present

## 2018-09-01 DIAGNOSIS — J45909 Unspecified asthma, uncomplicated: Secondary | ICD-10-CM | POA: Diagnosis not present

## 2018-09-01 LAB — BASIC METABOLIC PANEL
Anion gap: 12 (ref 5–15)
BUN: 8 mg/dL (ref 6–20)
CO2: 26 mmol/L (ref 22–32)
Calcium: 9.4 mg/dL (ref 8.9–10.3)
Chloride: 102 mmol/L (ref 98–111)
Creatinine, Ser: 0.99 mg/dL (ref 0.44–1.00)
GFR calc Af Amer: >60 mL/min (ref >60)
GFR calc non Af Amer: >60 mL/min (ref >60)
Glucose, Bld: 92 mg/dL (ref 70–99)
Potassium: 3.3 mmol/L — ABNORMAL LOW (ref 3.5–5.1)
Sodium: 140 mmol/L (ref 135–145)

## 2018-09-01 LAB — CBC
HCT: 40.3 % (ref 36.0–46.0)
Hemoglobin: 13.9 g/dL (ref 12.0–15.0)
MCH: 27.6 pg (ref 26.0–34.0)
MCHC: 34.5 g/dL (ref 30.0–36.0)
MCV: 80.1 fL (ref 78.0–100.0)
Platelets: 346 10*3/uL (ref 150–400)
RBC: 5.03 MIL/uL (ref 3.87–5.11)
RDW: 14.4 % (ref 11.5–15.5)
WBC: 9 10*3/uL (ref 4.0–10.5)

## 2018-09-01 LAB — I-STAT BETA HCG BLOOD, ED (MC, WL, AP ONLY): I-stat hCG, quantitative: <5 m[IU]/mL (ref <5)

## 2018-09-01 LAB — I-STAT TROPONIN, ED: Troponin i, poc: 0 ng/mL (ref 0.00–0.08)

## 2018-09-01 MED ORDER — ACETAMINOPHEN 500 MG PO TABS
1000.0000 mg | ORAL_TABLET | Freq: Once | ORAL | Status: AC
  Administered 2018-09-01: 1000 mg via ORAL
  Filled 2018-09-01: qty 2

## 2018-09-01 MED ORDER — LABETALOL HCL 5 MG/ML IV SOLN
20.0000 mg | Freq: Once | INTRAVENOUS | Status: AC
  Administered 2018-09-02: 20 mg via INTRAVENOUS
  Filled 2018-09-01: qty 4

## 2018-09-01 MED ORDER — NITROGLYCERIN 0.4 MG SL SUBL
0.4000 mg | SUBLINGUAL_TABLET | SUBLINGUAL | Status: DC | PRN
  Administered 2018-09-01 – 2018-09-02 (×2): 0.4 mg via SUBLINGUAL
  Filled 2018-09-01: qty 1

## 2018-09-01 NOTE — ED Triage Notes (Signed)
Pt to ER for 2 days of central chest pressure radiating into back. Reports also headache onset yesterday. Reports exertional sob and dizziness with standing. Pt in NAD at this time.

## 2018-09-01 NOTE — ED Provider Notes (Signed)
[facility] Provider Note   CSN: [id number] Arrival date & time: 09/01/18  1513     History   Chief Complaint Chief Complaint  Patient presents with  . Chest Pain    HPI Mary Robles is a 42 y.o. female.  HPI   Mary Robles is a 42 y.o. female, with a history of HTN, anxiety, presenting to the ED with headache beginning yesterday upon waking from a nap.  Pain is occipital, throbbing, 9/10, radiating down the back of the neck and throughout the head. Accompanied by nausea and lightheadedness upon standing.  Complains of chest pain present upon waking this morning, across the top of the chest, "like something sitting on my chest," radiating to the back, 7/10. Accompanied by exertional shortness of breath.  Last dose of her HCTZ and clonidine was around 7 AM this morning. Denies known fever, cough, vomiting, diarrhea, abdominal pain, syncope, diaphoresis, vision loss, neuro deficits, recent falls/trauma, or any other complaints.    Past Medical History:  Diagnosis Date  . Angio-edema   . Anxiety   . Asthma    no symptoms in "years", no inhaler  . Depression   . Herpes simplex   . Hyperlysinemia ([place])   . Hypertension   . Sickle cell trait ([place])   . Vitamin D deficiency     Patient Active Problem List   Diagnosis Date Noted  . Obesity (BMI 30.0-34.9) 07/02/2017  . MDD (major depressive disorder) 07/16/2016  . Asthma, mild 04/11/2015  . GAD (generalized anxiety disorder) 02/08/2015  . Hyperlipidemia 07/09/2014  . Insomnia 07/09/2014  . Lipoma of abdominal wall s/p excision 05/04/2014 05/19/2014  . Status post total abdominal hysterectomy 05/04/2014  . Hypertension   . Vitamin D deficiency     Past Surgical History:  Procedure Laterality Date  . ABDOMINAL HYSTERECTOMY N/A 05/04/2014   Procedure: HYSTERECTOMY ABDOMINAL with BILATERAL SALPINGECTOMY ;  Surgeon: Everardo All Amundson de Berton Lan, MD;  Location: [facility];  Service:  Gynecology;  Laterality: N/A;  . BREAST BIOPSY Right Jan. 2016   benign fibroadenoma  . FOOT SURGERY    . LAPAROTOMY N/A 05/04/2014   Procedure: REMOVAL OF ABD WALL MASS ;  Surgeon: Adin Hector, MD;  Location: [facility];  Service: General;  Laterality: N/A;  . LIPOMA EXCISION N/A 05/04/2014   Procedure: EXCISION LIPOMA of abdominal wall;  Surgeon: Jamey Reas de Berton Lan, MD;  Location: [facility];  Service: Gynecology;  Laterality: N/A;  . PELVIC LAPAROSCOPY Right 2018   oophorectomy  . TUBAL LIGATION       OB History    Gravida  4   Para  2   Term      Preterm      AB  1   Living  2     SAB      TAB  1   Ectopic      Multiple      Live Births               Home Medications    Prior to Admission medications   Medication Sig Start Date End Date Taking? Authorizing Provider  acetaminophen (TYLENOL) 500 MG tablet Take 1,000 mg by mouth every 6 (six) hours as needed (for pain or headaches).   Yes [provider]  albuterol (PROVENTIL HFA;VENTOLIN HFA) 108 (90 Base) MCG/ACT inhaler Inhale 2 puffs into the lungs every 6 (six) hours as needed for wheezing or shortness  of breath. 07/22/18  Yes Delsa Grana, PA-C  cloNIDine (CATAPRES) 0.1 MG tablet TAKE 1 TABLET BY MOUTH TWICE DAILY 04/14/18  Yes [place], Modena Nunnery, MD  EPINEPHrine (EPIPEN 2-PAK) 0.3 mg/0.3 mL IJ SOAJ injection Inject 0.3 mg into the muscle once as needed (for an anaphylactic reaction).   Yes [provider]  fluticasone (FLONASE) 50 MCG/ACT nasal spray Place 2 sprays into both nostrils daily as needed for allergies or rhinitis.  01/22/17  Yes [provider]  hydrochlorothiazide (HYDRODIURIL) 25 MG tablet Take 1 tablet (25 mg total) by mouth daily. 02/21/18  Yes [place], Modena Nunnery, MD  ibuprofen (ADVIL,MOTRIN) 200 MG tablet Take 600 mg by mouth every 6 (six) hours as needed (for pain or headaches).   Yes [provider]  levocetirizine (XYZAL) 5 MG tablet TAKE 1 TABLET  BY MOUTH IN THE EVENING 08/14/17  Yes [place], Modena Nunnery, MD  vortioxetine HBr (TRINTELLIX) 20 MG TABS Take 20 mg by mouth daily. Patient taking differently: Take 20 mg by mouth at bedtime.  01/03/18  Yes [place], Modena Nunnery, MD  benzonatate (TESSALON) 100 MG capsule Take 1 capsule (100 mg total) by mouth 3 (three) times daily as needed for cough. Patient not taking: Reported on 09/02/2018 07/22/18   Delsa Grana, PA-C  valACYclovir (VALTREX) 500 MG tablet Take 1 tablet (500 mg total) by mouth 2 (two) times daily. Take as needed for 3 days. Patient taking differently: Take 500 mg by mouth 2 (two) times daily as needed (for flares/take for 3 days).  02/24/18   Nunzio Cobbs, MD    Family History Family History  Problem Relation Age of Onset  . Diabetes Mother   . Thyroid disease Mother   . Stroke Father   . Mental retardation Maternal Aunt   . Diabetes Maternal Grandmother     Social History Social History   Tobacco Use  . Smoking status: Never Smoker  . Smokeless tobacco: Never Used  Substance Use Topics  . Alcohol use: Yes    Alcohol/week: 0.0 standard drinks    Comment: 2-3 x/year  . Drug use: No     Allergies   Vicodin [hydrocodone-acetaminophen]; Benazepril; Betadine [povidone iodine]; and Other   Review of Systems Review of Systems  Constitutional: Negative for chills, diaphoresis and fever.  Respiratory: Positive for shortness of breath. Negative for cough.   Cardiovascular: Positive for chest pain. Negative for palpitations and leg swelling.  Gastrointestinal: Positive for nausea. Negative for abdominal pain, diarrhea and vomiting.  Musculoskeletal: Negative for neck stiffness.  Neurological: Positive for light-headedness and headaches. Negative for syncope, facial asymmetry, weakness and numbness.  All other systems reviewed and are negative.    Physical Exam Updated Vital Signs BP (!) 187/123   Pulse 94   Temp 100.1 F (37.8 C) (Oral)   Resp 18    LMP 04/17/2014   SpO2 100%   Physical Exam  Constitutional: She is oriented to person, place, and time. She appears well-developed and well-nourished. No distress.  HENT:  Head: Normocephalic and atraumatic.  Mouth/Throat: Oropharynx is clear and moist.  Eyes: Pupils are equal, round, and reactive to light. Conjunctivae and EOM are normal.  Neck: Normal range of motion. Neck supple. No neck rigidity.  Cardiovascular: Normal rate, regular rhythm, normal heart sounds and intact distal pulses.  Pulses:      Radial pulses are 2+ on the right side, and 2+ on the left side.       Posterior tibial pulses  are 2+ on the right side, and 2+ on the left side.  Pulses are equal bilaterally.  Pulmonary/Chest: Effort normal and breath sounds normal. No respiratory distress.  Abdominal: Soft. There is no tenderness. There is no guarding.  Musculoskeletal: She exhibits no edema.  Normal motor function intact in all extremities. No midline spinal tenderness.   Lymphadenopathy:    She has no cervical adenopathy.  Neurological: She is alert and oriented to person, place, and time.  Sensation grossly intact to light touch in the extremities. No noted speech deficits. No aphasia. Patient handles oral secretions without difficulty. No noted swallowing defects.  Equal grip strength bilaterally. Strength 5/5 in the upper extremities. Strength 5/5 with flexion and extension of the hips, knees, and ankles bilaterally.  Negative Romberg. No gait disturbance.  Coordination intact including heel to shin and finger to nose.  Cranial nerves III-XII grossly intact.  No facial droop.   Skin: Skin is warm and dry. She is not diaphoretic.  Psychiatric: She has a normal mood and affect. Her behavior is normal.  Nursing note and vitals reviewed.    ED Treatments / Results  Labs (all labs ordered are listed, but only abnormal results are displayed) Labs Reviewed  BASIC METABOLIC PANEL - Abnormal; Notable for the  following components:      Result Value   Potassium 3.3 (*)    All other components within normal limits  CBC  I-STAT TROPONIN, ED  I-STAT BETA HCG BLOOD, ED ([facility], [facility], [facility] ONLY)  I-STAT TROPONIN, ED    EKG EKG Interpretation  Date/Time:  Monday September 01 2018 15:26:15 EDT Ventricular Rate:  97 PR Interval:  128 QRS Duration: 82 QT Interval:  352 QTC Calculation: 447 R Axis:   69 Text Interpretation:  Normal sinus rhythm Cannot rule out Anterior infarct , age undetermined Abnormal ECG Confirmed by Virgel Manifold [phone]) on 09/01/2018 11:11:17 PM   Radiology Dg Chest 2 View  Result Date: 09/01/2018 CLINICAL DATA:  Central chest pain for 1 day EXAM: CHEST - 2 VIEW COMPARISON:  12/03/2014 FINDINGS: The heart size and mediastinal contours are within normal limits. Both lungs are clear. The visualized skeletal structures are unremarkable. IMPRESSION: No active cardiopulmonary disease. Electronically Signed   By: Inez Catalina M.D.   On: 09/01/2018 16:08   Ct Head Wo Contrast  Result Date: 09/02/2018 CLINICAL DATA:  Two day history of central chest pressure radiating to the back. Headaches since yesterday. Shortness of breath and dizziness when standing. EXAM: CT HEAD WITHOUT CONTRAST TECHNIQUE: Contiguous axial images were obtained from the base of the skull through the vertex without intravenous contrast. COMPARISON:  01/29/2014 FINDINGS: Brain: No evidence of acute infarction, hemorrhage, hydrocephalus, extra-axial collection or mass lesion/mass effect. Vascular: No hyperdense vessel or unexpected calcification. Skull: Normal. Negative for fracture or focal lesion. Sinuses/Orbits: Paranasal sinuses and mastoid air cells are clear. Other: None. IMPRESSION: No acute intracranial abnormalities. Electronically Signed   By: Lucienne Capers M.D.   On: 09/02/2018 03:45    Procedures Procedures (including critical care time)  Medications Ordered in ED Medications  nitroGLYCERIN (NITROSTAT)  SL tablet 0.4 mg (0.4 mg Sublingual Given 09/02/18 0022)  acetaminophen (TYLENOL) tablet 1,000 mg (1,000 mg Oral Given 09/01/18 2308)  labetalol (NORMODYNE,TRANDATE) injection 20 mg (20 mg Intravenous Given 09/02/18 0022)  methylPREDNISolone sodium succinate (SOLU-MEDROL) 125 mg/2 mL injection 125 mg (125 mg Intravenous Given 09/02/18 0149)  metoCLOPramide (REGLAN) injection 10 mg (10 mg Intravenous Given 09/02/18 0147)  sodium chloride 0.9 %  bolus 1,000 mL (0 mLs Intravenous Stopped 09/02/18 0324)  potassium chloride SA (K-DUR,KLOR-CON) CR tablet 40 mEq (40 mEq Oral Given 09/02/18 0358)     Initial Impression / Assessment and Plan / ED Course  I have reviewed the triage vital signs and the nursing notes.  Pertinent labs & imaging results that were available during my care of the patient were reviewed by me and considered in my medical decision making (see chart for details).  Clinical Course as of Sep 03 419  Mon Sep 01, 2018  2336 Patient states her chest pain has improved to 6/10.   [SJ]  Tue Sep 02, 2018  0108 Chest pain has resolved. Still endorses a headache, but only when she sits upright.  BP now 124/64.   [SJ]  0230 Patient's headache has resolved.  No additional symptoms.   [SJ]    Clinical Course User Index [SJ] Jasmain Ahlberg C, PA-C    Patient presents with headache and chest pain.  Suspect hypertensive urgency. HEART score is 4, indicating moderate risk for a cardiac event. Wells criteria score is 0, indicating low risk for PE. PERC negative. Low suspicion for intrathoracic dissection; extremity pulses are equal bilaterally, no widened mediastinum on chest x-ray, delta troponins negative, no neuro deficits. After the patient's work-up was complete and her symptoms resolved shared decision-making was used to discuss discharge with close PCP follow-up versus admission.  Patient opted for the former option. The patient was given instructions for home care as well as return  precautions. Patient voices understanding of these instructions, accepts the plan, and is comfortable with discharge.  Findings and plan of care discussed with Virgel Manifold, MD.    Vitals:   09/01/18 1528 09/01/18 1710 09/01/18 1950  BP: (!) 151/117 (!) 165/102 (!) 187/123  Pulse: 93 92 94  Resp: 18 18 18   Temp: 98.7 F (37.1 C) 99.6 F (37.6 C) 100.1 F (37.8 C)  TempSrc: Oral Oral Oral  SpO2: 100% 98% 100%   Vitals:   09/02/18 0304 09/02/18 0315 09/02/18 0330 09/02/18 0345  BP:  130/85 110/69 106/78  Pulse: 88 80 83 81  Resp: 17 19 18 17   Temp:      TempSrc:      SpO2: 97% 98% 97% 96%     Final Clinical Impressions(s) / ED Diagnoses   Final diagnoses:  Hypertensive urgency    ED Discharge Orders    None       Layla Maw 09/02/18 0420    Virgel Manifold, MD 09/02/18 [phone]

## 2018-09-01 NOTE — Telephone Encounter (Signed)
Received call from patient.   Reports that she has had severe HA x2 days with no resolution from Excedrin Migraine. Also states that she now has dizziness and severe chest pain radiating through to her back.   Advised to go to ER for eval.

## 2018-09-02 ENCOUNTER — Emergency Department (HOSPITAL_COMMUNITY): Payer: 59

## 2018-09-02 DIAGNOSIS — R51 Headache: Secondary | ICD-10-CM | POA: Diagnosis not present

## 2018-09-02 LAB — I-STAT TROPONIN, ED: TROPONIN I, POC: 0.01 ng/mL (ref 0.00–0.08)

## 2018-09-02 MED ORDER — METOCLOPRAMIDE HCL 5 MG/ML IJ SOLN
10.0000 mg | Freq: Once | INTRAMUSCULAR | Status: AC
  Administered 2018-09-02: 10 mg via INTRAVENOUS
  Filled 2018-09-02: qty 2

## 2018-09-02 MED ORDER — SODIUM CHLORIDE 0.9 % IV BOLUS
1000.0000 mL | Freq: Once | INTRAVENOUS | Status: AC
  Administered 2018-09-02: 1000 mL via INTRAVENOUS

## 2018-09-02 MED ORDER — POTASSIUM CHLORIDE CRYS ER 20 MEQ PO TBCR
40.0000 meq | EXTENDED_RELEASE_TABLET | Freq: Once | ORAL | Status: AC
  Administered 2018-09-02: 40 meq via ORAL
  Filled 2018-09-02: qty 2

## 2018-09-02 MED ORDER — METHYLPREDNISOLONE SODIUM SUCC 125 MG IJ SOLR
125.0000 mg | Freq: Once | INTRAMUSCULAR | Status: AC
  Administered 2018-09-02: 125 mg via INTRAVENOUS
  Filled 2018-09-02: qty 2

## 2018-09-02 NOTE — Discharge Instructions (Addendum)
°  Headache There was evidence of some minor hypokalemia (low potassium).  Follow-up with your primary care provider on this matter.  Your lab results otherwise showed no acute abnormalities.  For future headaches please try the following regimen: Antiinflammatory medications: Take 600 mg of ibuprofen every 6 hours or 440 mg (over the counter dose) to 500 mg (prescription dose) of naproxen every 12 hours for the next 3 days. After this time, these medications may be used as needed for pain. Take these medications with food to avoid upset stomach. Choose only one of these medications, do not take them together. Tylenol: Should you continue to have additional pain while taking the ibuprofen or naproxen, you may add in tylenol as needed. Your daily total maximum amount of tylenol from all sources should be limited to 4000mg /day for persons without liver problems, or 2000mg /day for those with liver problems.  Hydration: Have a goal of about a half liter of water every couple hours to stay well hydrated.   Sleep: Please be sure to get plenty of sleep with a goal of 8 hours per night. Having a regular bed time and bedtime routine can help with this.  Screens: Reduce the amount of time you are in front of screens.  Take about a 5-10-minute break every hour or every couple hours to give your eyes rest.  Do not use screens in dark rooms.  Glasses with a blue light filter may also help reduce eye fatigue.  Stress: Take steps to reduce stress as much as possible.   Follow up: Follow-up with your primary care provider on this issue.   Please also follow-up with your primary care provider regarding your blood pressure as your blood pressure medications may need to be adjusted.

## 2018-09-02 NOTE — Progress Notes (Signed)
GYNECOLOGY  VISIT   HPI: 42 y.o.   Legally Separated  African American  female   [phone] with Patient's last menstrual period was 04/17/2014.   here for vaginal irritation.    Discharge, itching, and odor.  External irritation.  Had a yeast infection 2 weeks ago and did over the counter treatment, and symptoms did not completely resolved.   No new partner.  Used Dial soap.  Switched her laundry detergent lately.   Having elevated blood pressure and went to the ER this week.  Will see her PCP in follow up.  GYNECOLOGIC HISTORY: Patient's last menstrual period was 04/17/2014. Contraception:  Hysterectomy  Menopausal hormone therapy:  None  Last mammogram:  04-21-18 R breast U/S density B/BIRADS 1 negative  Last pap smear:   01-21-14 negative, HR HPV negative         OB History    Gravida  4   Para  2   Term      Preterm      AB  1   Living  2     SAB      TAB  1   Ectopic      Multiple      Live Births                 Patient Active Problem List   Diagnosis Date Noted  . Obesity (BMI 30.0-34.9) 07/02/2017  . MDD (major depressive disorder) 07/16/2016  . Asthma, mild 04/11/2015  . GAD (generalized anxiety disorder) 02/08/2015  . Hyperlipidemia 07/09/2014  . Insomnia 07/09/2014  . Lipoma of abdominal wall s/p excision 05/04/2014 05/19/2014  . Status post total abdominal hysterectomy 05/04/2014  . Hypertension   . Vitamin D deficiency     Past Medical History:  Diagnosis Date  . Angio-edema   . Anxiety   . Asthma    no symptoms in "years", no inhaler  . Depression   . Herpes simplex   . Hyperlysinemia ([place])   . Hypertension   . Sickle cell trait ([place])   . Vitamin D deficiency     Past Surgical History:  Procedure Laterality Date  . ABDOMINAL HYSTERECTOMY N/A 05/04/2014   Procedure: HYSTERECTOMY ABDOMINAL with BILATERAL SALPINGECTOMY ;  Surgeon: Everardo All Amundson de Berton Lan, MD;  Location: [facility];  Service: Gynecology;  Laterality: N/A;   . BREAST BIOPSY Right Jan. 2016   benign fibroadenoma  . FOOT SURGERY    . LAPAROTOMY N/A 05/04/2014   Procedure: REMOVAL OF ABD WALL MASS ;  Surgeon: Adin Hector, MD;  Location: [facility];  Service: General;  Laterality: N/A;  . LIPOMA EXCISION N/A 05/04/2014   Procedure: EXCISION LIPOMA of abdominal wall;  Surgeon: Jamey Reas de Berton Lan, MD;  Location: [facility];  Service: Gynecology;  Laterality: N/A;  . PELVIC LAPAROSCOPY Right 2018   oophorectomy  . TUBAL LIGATION      Current Outpatient Medications  Medication Sig Dispense Refill  . acetaminophen (TYLENOL) 500 MG tablet Take 1,000 mg by mouth every 6 (six) hours as needed (for pain or headaches).    Marland Kitchen albuterol (PROVENTIL HFA;VENTOLIN HFA) 108 (90 Base) MCG/ACT inhaler Inhale 2 puffs into the lungs every 6 (six) hours as needed for wheezing or shortness of breath. 1 Inhaler 2  . benzonatate (TESSALON) 100 MG capsule Take 1 capsule (100 mg total) by mouth 3 (three) times daily as needed for cough. 30 capsule 0  . cloNIDine (CATAPRES) 0.1 MG tablet  TAKE 1 TABLET BY MOUTH TWICE DAILY 180 tablet 1  . EPINEPHrine (EPIPEN 2-PAK) 0.3 mg/0.3 mL IJ SOAJ injection Inject 0.3 mg into the muscle once as needed (for an anaphylactic reaction).    . fluticasone (FLONASE) 50 MCG/ACT nasal spray Place 2 sprays into both nostrils daily as needed for allergies or rhinitis.     . hydrochlorothiazide (HYDRODIURIL) 25 MG tablet Take 1 tablet (25 mg total) by mouth daily. 30 tablet 6  . ibuprofen (ADVIL,MOTRIN) 200 MG tablet Take 600 mg by mouth every 6 (six) hours as needed (for pain or headaches).    Marland Kitchen levocetirizine (XYZAL) 5 MG tablet TAKE 1 TABLET BY MOUTH IN THE EVENING 90 tablet 3  . vortioxetine HBr (TRINTELLIX) 20 MG TABS Take 20 mg by mouth daily. (Patient taking differently: Take 20 mg by mouth at bedtime. ) 90 tablet 2   No current facility-administered medications for this visit.      ALLERGIES: Vicodin  [hydrocodone-acetaminophen]; Benazepril; Betadine [povidone iodine]; and Other  Family History  Problem Relation Age of Onset  . Diabetes Mother   . Thyroid disease Mother   . Stroke Father   . Mental retardation Maternal Aunt   . Diabetes Maternal Grandmother     Social History   Socioeconomic History  . Marital status: Legally Separated    Spouse name: Not on file  . Number of children: Not on file  . Years of education: Not on file  . Highest education level: Not on file  Occupational History  . Not on file  Social Needs  . Financial resource strain: Not on file  . Food insecurity:    Worry: Not on file    Inability: Not on file  . Transportation needs:    Medical: Not on file    Non-medical: Not on file  Tobacco Use  . Smoking status: Never Smoker  . Smokeless tobacco: Never Used  Substance and Sexual Activity  . Alcohol use: Yes    Alcohol/week: 0.0 standard drinks    Comment: 2-3 x/year  . Drug use: No  . Sexual activity: Yes    Partners: Male    Birth control/protection: Surgical    Comment: BTSP 2005/TAH/BSO  Lifestyle  . Physical activity:    Days per week: Not on file    Minutes per session: Not on file  . Stress: Not on file  Relationships  . Social connections:    Talks on phone: Not on file    Gets together: Not on file    Attends religious service: Not on file    Active member of club or organization: Not on file    Attends meetings of clubs or organizations: Not on file    Relationship status: Not on file  . Intimate partner violence:    Fear of current or ex partner: Not on file    Emotionally abused: Not on file    Physically abused: Not on file    Forced sexual activity: Not on file  Other Topics Concern  . Not on file  Social History Narrative  . Not on file    Review of Systems  Constitutional: Negative.   HENT: Negative.   Eyes: Negative.   Respiratory: Negative.   Cardiovascular: Negative.   Gastrointestinal: Negative.    Endocrine: Negative.   Genitourinary:       Vaginal irritation/discharge  Musculoskeletal: Negative.   Skin: Negative.   Allergic/Immunologic: Negative.   Neurological: Negative.   Hematological: Negative.   Psychiatric/Behavioral: Negative.  All other systems reviewed and are negative.   PHYSICAL EXAMINATION:    BP (!) 142/112 (BP Location: Left Arm, Patient Position: Sitting, Cuff Size: Normal)   Pulse 72   Temp 98.1 F (36.7 C) (Oral)   Resp 16   Ht 5\' 5"  (1.651 m)   Wt 187 lb 1.6 oz (84.9 kg)   LMP 04/17/2014   BMI 31.14 kg/m     General appearance: alert, cooperative and appears stated age   Pelvic: External genitalia:  no lesions              Urethra:  normal appearing urethra with no masses, tenderness or lesions              Bartholins and Skenes: normal                 Vagina: normal appearing vagina with normal color and white discharge, no lesions              Cervix:  absent                Bimanual Exam:  Uterus:   absent              Adnexa: no mass, fullness, tenderness             Chaperone was present for exam.  ASSESSMENT  Vaginitis. Elevated BP.   PLAN  Affirm taken.  Avoid skin irritants.   Try Dove line of soaps. Mycolog II.  She will call her PCP to schedule an appointment for BP management.    An After Visit Summary was printed and given to the patient. ___15______ minutes face to face time of which over 50% was spent in counseling.

## 2018-09-03 ENCOUNTER — Ambulatory Visit: Payer: 59 | Admitting: Obstetrics and Gynecology

## 2018-09-03 ENCOUNTER — Ambulatory Visit (INDEPENDENT_AMBULATORY_CARE_PROVIDER_SITE_OTHER): Payer: 59 | Admitting: Family Medicine

## 2018-09-03 ENCOUNTER — Other Ambulatory Visit: Payer: Self-pay

## 2018-09-03 ENCOUNTER — Encounter: Payer: Self-pay | Admitting: Obstetrics and Gynecology

## 2018-09-03 ENCOUNTER — Encounter: Payer: Self-pay | Admitting: Family Medicine

## 2018-09-03 VITALS — BP 142/112 | HR 72 | Temp 98.1°F | Resp 16 | Ht 65.0 in | Wt 187.1 lb

## 2018-09-03 VITALS — BP 140/82 | HR 86 | Temp 98.8°F | Resp 16 | Ht 65.0 in | Wt 187.0 lb

## 2018-09-03 DIAGNOSIS — J0191 Acute recurrent sinusitis, unspecified: Secondary | ICD-10-CM

## 2018-09-03 DIAGNOSIS — N760 Acute vaginitis: Secondary | ICD-10-CM

## 2018-09-03 DIAGNOSIS — I10 Essential (primary) hypertension: Secondary | ICD-10-CM | POA: Diagnosis not present

## 2018-09-03 MED ORDER — MONTELUKAST SODIUM 10 MG PO TABS: 10.0000 mg | ORAL_TABLET | Freq: Every day | ORAL | 3 refills | Status: DC

## 2018-09-03 MED ORDER — TRIAMCINOLONE ACETONIDE 0.025 % EX OINT: 1.0000 "application " | TOPICAL_OINTMENT | Freq: Two times a day (BID) | CUTANEOUS | 0 refills | Status: DC

## 2018-09-03 MED ORDER — NYSTATIN-TRIAMCINOLONE 100000-0.1 UNIT/GM-% EX CREA: 1.0000 "application " | TOPICAL_CREAM | Freq: Two times a day (BID) | CUTANEOUS | 0 refills | Status: DC

## 2018-09-03 MED ORDER — AMOXICILLIN-POT CLAVULANATE 875-125 MG PO TABS: 1.0000 | ORAL_TABLET | Freq: Two times a day (BID) | ORAL | 0 refills | Status: DC

## 2018-09-03 MED ORDER — PREDNISONE 20 MG PO TABS: 40.0000 mg | ORAL_TABLET | Freq: Every day | ORAL | 0 refills | Status: AC

## 2018-09-03 MED ORDER — NYSTATIN 100000 UNIT/GM EX CREA: 1.0000 "application " | TOPICAL_CREAM | Freq: Two times a day (BID) | CUTANEOUS | 0 refills | Status: DC

## 2018-09-03 NOTE — Progress Notes (Signed)
Patient ID: Mary Robles, female    DOB: 1975-12-29, 42 y.o.   MRN: [id number]  PCP: Alycia Rossetti, MD  Chief Complaint  Patient presents with  . ER F/U    elevated BP  . Illness    sinus pressure, HA, pressure behind R eye    Subjective:   Mary Robles is a 42 y.o. female, presents to clinic with CC of elevated BP, HA, congestion and fever.  Pt felt ill two days ago, BP was elevated with HA, eye pain, N/V and she was recommended to go to the ER.  She had throughout work up in the ER, cardiac testing was negative, Head CT was negative, BP decreased while in the ER and HA resolved.  Source of fever was unknown but she was encouraged to f/up with PCP.     Patient Active Problem List   Diagnosis Date Noted  . Obesity (BMI 30.0-34.9) 07/02/2017  . MDD (major depressive disorder) 07/16/2016  . Asthma, mild 04/11/2015  . GAD (generalized anxiety disorder) 02/08/2015  . Hyperlipidemia 07/09/2014  . Insomnia 07/09/2014  . Lipoma of abdominal wall s/p excision 05/04/2014 05/19/2014  . Status post total abdominal hysterectomy 05/04/2014  . Hypertension   . Vitamin D deficiency      Prior to Admission medications   Medication Sig Start Date End Date Taking? Authorizing Provider  acetaminophen (TYLENOL) 500 MG tablet Take 1,000 mg by mouth every 6 (six) hours as needed (for pain or headaches).   Yes [provider]  albuterol (PROVENTIL HFA;VENTOLIN HFA) 108 (90 Base) MCG/ACT inhaler Inhale 2 puffs into the lungs every 6 (six) hours as needed for wheezing or shortness of breath. 07/22/18  Yes Delsa Grana, PA-C  cloNIDine (CATAPRES) 0.1 MG tablet TAKE 1 TABLET BY MOUTH TWICE DAILY 04/14/18  Yes Parsons, Modena Nunnery, MD  EPINEPHrine (EPIPEN 2-PAK) 0.3 mg/0.3 mL IJ SOAJ injection Inject 0.3 mg into the muscle once as needed (for an anaphylactic reaction).   Yes [provider]  fluticasone (FLONASE) 50 MCG/ACT nasal spray Place 2 sprays into both nostrils daily as needed for  allergies or rhinitis.  01/22/17  Yes [provider]  hydrochlorothiazide (HYDRODIURIL) 25 MG tablet Take 1 tablet (25 mg total) by mouth daily. 02/21/18  Yes [place], Modena Nunnery, MD  ibuprofen (ADVIL,MOTRIN) 200 MG tablet Take 600 mg by mouth every 6 (six) hours as needed (for pain or headaches).   Yes [provider]  levocetirizine (XYZAL) 5 MG tablet TAKE 1 TABLET BY MOUTH IN THE EVENING 08/14/17  Yes Bude, Modena Nunnery, MD  nystatin cream (MYCOSTATIN) Apply 1 application topically 2 (two) times daily. 09/03/18  Yes Amundson Raliegh Ip, MD  nystatin-triamcinolone [facility]) cream Apply 1 application topically 2 (two) times daily. Apply to affected area BID for up to 7 days. 09/03/18  Yes Nunzio Cobbs, MD  triamcinolone (KENALOG) 0.025 % ointment Apply 1 application topically 2 (two) times daily. 09/03/18  Yes Amundson Raliegh Ip, MD  vortioxetine HBr (TRINTELLIX) 20 MG TABS Take 20 mg by mouth daily. Patient taking differently: Take 20 mg by mouth at bedtime.  01/03/18  Yes , Modena Nunnery, MD     Allergies  Allergen Reactions  . Vicodin [Hydrocodone-Acetaminophen] Other (See Comments)    Hallucinations   . Benazepril Swelling    Lips and face became swollen  . Betadine [Povidone Iodine] Other (See Comments)    Blisters   . Other Hives and  Itching    Brazilian nuts      Family History  Problem Relation Age of Onset  . Diabetes Mother   . Thyroid disease Mother   . Stroke Father   . Mental retardation Maternal Aunt   . Diabetes Maternal Grandmother      Social History   Socioeconomic History  . Marital status: Legally Separated    Spouse name: Not on file  . Number of children: Not on file  . Years of education: Not on file  . Highest education level: Not on file  Occupational History  . Not on file  Social Needs  . Financial resource strain: Not on file  . Food insecurity:    Worry: Not on file    Inability: Not on file  .  Transportation needs:    Medical: Not on file    Non-medical: Not on file  Tobacco Use  . Smoking status: Never Smoker  . Smokeless tobacco: Never Used  Substance and Sexual Activity  . Alcohol use: Yes    Alcohol/week: 0.0 standard drinks    Comment: 2-3 x/year  . Drug use: No  . Sexual activity: Yes    Partners: Male    Birth control/protection: Surgical    Comment: BTSP 2005/TAH/BSO  Lifestyle  . Physical activity:    Days per week: Not on file    Minutes per session: Not on file  . Stress: Not on file  Relationships  . Social connections:    Talks on phone: Not on file    Gets together: Not on file    Attends religious service: Not on file    Active member of club or organization: Not on file    Attends meetings of clubs or organizations: Not on file    Relationship status: Not on file  . Intimate partner violence:    Fear of current or ex partner: Not on file    Emotionally abused: Not on file    Physically abused: Not on file    Forced sexual activity: Not on file  Other Topics Concern  . Not on file  Social History Narrative  . Not on file     Review of Systems     Objective:    Vitals:   09/03/18 1426  BP: 140/82  Pulse: 86  Resp: 16  Temp: 98.8 F (37.1 C)  TempSrc: Oral  SpO2: 100%  Weight: 187 lb (84.8 kg)  Height: 5\' 5"  (1.651 m)      Physical Exam  Constitutional: She is oriented to person, place, and time. She appears well-developed and well-nourished.  Non-toxic appearance. No distress.  HENT:  Head: Normocephalic and atraumatic.  Right Ear: External ear normal.  Left Ear: External ear normal.  Nose: Mucosal edema and rhinorrhea present. Right sinus exhibits maxillary sinus tenderness and frontal sinus tenderness. Left sinus exhibits maxillary sinus tenderness and frontal sinus tenderness.  Mouth/Throat: Uvula is midline, oropharynx is clear and moist and mucous membranes are normal. No oropharyngeal exudate.  Eyes: Pupils are equal,  round, and reactive to light. Conjunctivae, EOM and lids are normal.  Neck: Normal range of motion and phonation normal. Neck supple. No tracheal deviation present.  Cardiovascular: Normal rate, regular rhythm, normal heart sounds and normal pulses. Exam reveals no gallop and no friction rub.  No murmur heard. Pulses:      Radial pulses are 2+ on the right side, and 2+ on the left side.       Posterior tibial pulses  are 2+ on the right side, and 2+ on the left side.  Pulmonary/Chest: Effort normal and breath sounds normal. No stridor. No respiratory distress. She has no wheezes. She has no rhonchi. She has no rales. She exhibits no tenderness.  Abdominal: Soft. Normal appearance and bowel sounds are normal. She exhibits no distension. There is no tenderness. There is no rebound and no guarding.  Musculoskeletal: Normal range of motion. She exhibits no edema or deformity.  Lymphadenopathy:    She has no cervical adenopathy.  Neurological: She is alert and oriented to person, place, and time. She has normal strength. No cranial nerve deficit. She exhibits normal muscle tone. Coordination and gait normal.  Skin: Skin is warm, dry and intact. Capillary refill takes less than 2 seconds. No rash noted. She is not diaphoretic. No pallor.  Psychiatric: She has a normal mood and affect. Her speech is normal and behavior is normal.  Nursing note and vitals reviewed.         Assessment & Plan:      ICD-10-CM   1. Acute recurrent sinusitis, unspecified location J01.91 montelukast (SINGULAIR) 10 MG tablet    predniSONE (DELTASONE) 20 MG tablet    amoxicillin-clavulanate (AUGMENTIN) 875-125 MG tablet  2. Essential hypertension I10      BP 140/82, no quite at goal, but pt is still having pain and I suspect sinus infection.  Will tx acute bacterial sinusitis and have pt track BP - log given she will continue her current medicines and keep track, follow-up with log in 1 to 2 weeks, instructed to call  us sooner if blood pressures are elevated over 160/100.    Delsa Grana, PA-C 09/03/18 2:49 PM

## 2018-09-03 NOTE — Addendum Note (Signed)
Addended by: Amado Coe on: 09/03/2018 10:55 AM   Modules accepted: Orders

## 2018-09-03 NOTE — Patient Instructions (Signed)

## 2018-09-03 NOTE — Patient Instructions (Signed)
Record BP for the next 1-2 weeks and I'll see you for recheck in office  Call me sooner if BP is on average > 160/110 - will need an additional med before visit    Managing Your Hypertension Hypertension is commonly called high blood pressure. This is when the force of your blood pressing against the walls of your arteries is too strong. Arteries are blood vessels that carry blood from your heart throughout your body. Hypertension forces the heart to work harder to pump blood, and may cause the arteries to become narrow or stiff. Having untreated or uncontrolled hypertension can cause heart attack, stroke, kidney disease, and other problems. What are blood pressure readings? A blood pressure reading consists of a higher number over a lower number. Ideally, your blood pressure should be below 120/80. The first ("top") number is called the systolic pressure. It is a measure of the pressure in your arteries as your heart beats. The second ("bottom") number is called the diastolic pressure. It is a measure of the pressure in your arteries as the heart relaxes. What does my blood pressure reading mean? Blood pressure is classified into four stages. Based on your blood pressure reading, your health care provider may use the following stages to determine what type of treatment you need, if any. Systolic pressure and diastolic pressure are measured in a unit called mm Hg. Normal  Systolic pressure: below 174.  Diastolic pressure: below 80. Elevated  Systolic pressure: 081-448.  Diastolic pressure: below 80. Hypertension stage 1  Systolic pressure: 185-631.  Diastolic pressure: 49-70. Hypertension stage 2  Systolic pressure: 263 or above.  Diastolic pressure: 90 or above. What health risks are associated with hypertension? Managing your hypertension is an important responsibility. Uncontrolled hypertension can lead to:  A heart attack.  A stroke.  A weakened blood vessel  (aneurysm).  Heart failure.  Kidney damage.  Eye damage.  Metabolic syndrome.  Memory and concentration problems.  What changes can I make to manage my hypertension? Hypertension can be managed by making lifestyle changes and possibly by taking medicines. Your health care provider will help you make a plan to bring your blood pressure within a normal range. Eating and drinking  Eat a diet that is high in fiber and potassium, and low in salt (sodium), added sugar, and fat. An example eating plan is called the DASH (Dietary Approaches to Stop Hypertension) diet. To eat this way: ? Eat plenty of fresh fruits and vegetables. Try to fill half of your plate at each meal with fruits and vegetables. ? Eat whole grains, such as whole wheat pasta, brown rice, or whole grain bread. Fill about one quarter of your plate with whole grains. ? Eat low-fat diary products. ? Avoid fatty cuts of meat, processed or cured meats, and poultry with skin. Fill about one quarter of your plate with lean proteins such as fish, chicken without skin, beans, eggs, and tofu. ? Avoid premade and processed foods. These tend to be higher in sodium, added sugar, and fat.  Reduce your daily sodium intake. Most people with hypertension should eat less than 1,500 mg of sodium a day.  Limit alcohol intake to no more than 1 drink a day for nonpregnant women and 2 drinks a day for men. One drink equals 12 oz of beer, 5 oz of wine, or 1 oz of hard liquor. Lifestyle  Work with your health care provider to maintain a healthy body weight, or to lose weight. Ask what an  ideal weight is for you.  Get at least 30 minutes of exercise that causes your heart to beat faster (aerobic exercise) most days of the week. Activities may include walking, swimming, or biking.  Include exercise to strengthen your muscles (resistance exercise), such as weight lifting, as part of your weekly exercise routine. Try to do these types of exercises for  30 minutes at least 3 days a week.  Do not use any products that contain nicotine or tobacco, such as cigarettes and e-cigarettes. If you need help quitting, ask your health care provider.  Control any long-term (chronic) conditions you have, such as high cholesterol or diabetes. Monitoring  Monitor your blood pressure at home as told by your health care provider. Your personal target blood pressure may vary depending on your medical conditions, your age, and other factors.  Have your blood pressure checked regularly, as often as told by your health care provider. Working with your health care provider  Review all the medicines you take with your health care provider because there may be side effects or interactions.  Talk with your health care provider about your diet, exercise habits, and other lifestyle factors that may be contributing to hypertension.  Visit your health care provider regularly. Your health care provider can help you create and adjust your plan for managing hypertension. Will I need medicine to control my blood pressure? Your health care provider may prescribe medicine if lifestyle changes are not enough to get your blood pressure under control, and if:  Your systolic blood pressure is 130 or higher.  Your diastolic blood pressure is 80 or higher.  Take medicines only as told by your health care provider. Follow the directions carefully. Blood pressure medicines must be taken as prescribed. The medicine does not work as well when you skip doses. Skipping doses also puts you at risk for problems. Contact a health care provider if:  You think you are having a reaction to medicines you have taken.  You have repeated (recurrent) headaches.  You feel dizzy.  You have swelling in your ankles.  You have trouble with your vision. Get help right away if:  You develop a severe headache or confusion.  You have unusual weakness or numbness, or you feel faint.  You  have severe pain in your chest or abdomen.  You vomit repeatedly.  You have trouble breathing. Summary  Hypertension is when the force of blood pumping through your arteries is too strong. If this condition is not controlled, it may put you at risk for serious complications.  Your personal target blood pressure may vary depending on your medical conditions, your age, and other factors. For most people, a normal blood pressure is less than 120/80.  Hypertension is managed by lifestyle changes, medicines, or both. Lifestyle changes include weight loss, eating a healthy, low-sodium diet, exercising more, and limiting alcohol. This information is not intended to replace advice given to you by your health care provider. Make sure you discuss any questions you have with your health care provider. Document Released: 08/27/2012 Document Revised: 10/31/2016 Document Reviewed: 10/31/2016 Elsevier Interactive Patient Education  2018 [employer].   Sinusitis, Adult Sinusitis is soreness and inflammation of your sinuses. Sinuses are hollow spaces in the bones around your face. They are located:  Around your eyes.  In the middle of your forehead.  Behind your nose.  In your cheekbones.  Your sinuses and nasal passages are lined with a stringy fluid (mucus). Mucus normally drains out  of your sinuses. When your nasal tissues get inflamed or swollen, the mucus can get trapped or blocked so air cannot flow through your sinuses. This lets bacteria, viruses, and funguses grow, and that leads to infection. Follow these instructions at home: Medicines  Take, use, or apply over-the-counter and prescription medicines only as told by your doctor. These may include nasal sprays.  If you were prescribed an antibiotic medicine, take it as told by your doctor. Do not stop taking the antibiotic even if you start to feel better. Hydrate and Humidify  Drink enough water to keep your pee (urine) clear or pale  yellow.  Use a cool mist humidifier to keep the humidity level in your home above 50%.  Breathe in steam for 10-15 minutes, 3-4 times a day or as told by your doctor. You can do this in the bathroom while a hot shower is running.  Try not to spend time in cool or dry air. Rest  Rest as much as possible.  Sleep with your head raised (elevated).  Make sure to get enough sleep each night. General instructions  Put a warm, moist washcloth on your face 3-4 times a day or as told by your doctor. This will help with discomfort.  Wash your hands often with soap and water. If there is no soap and water, use hand sanitizer.  Do not smoke. Avoid being around people who are smoking (secondhand smoke).  Keep all follow-up visits as told by your doctor. This is important. Contact a doctor if:  You have a fever.  Your symptoms get worse.  Your symptoms do not get better within 10 days. Get help right away if:  You have a very bad headache.  You cannot stop throwing up (vomiting).  You have pain or swelling around your face or eyes.  You have trouble seeing.  You feel confused.  Your neck is stiff.  You have trouble breathing. This information is not intended to replace advice given to you by your health care provider. Make sure you discuss any questions you have with your health care provider. Document Released: 05/21/2008 Document Revised: 07/29/2016 Document Reviewed: 09/28/2015 Elsevier Interactive Patient Education  [employer].

## 2018-09-04 LAB — VAGINITIS/VAGINOSIS, DNA PROBE
Candida Species: NEGATIVE
GARDNERELLA VAGINALIS: POSITIVE — AB
Trichomonas vaginosis: NEGATIVE

## 2018-09-07 ENCOUNTER — Other Ambulatory Visit: Payer: Self-pay | Admitting: Family Medicine

## 2018-09-10 ENCOUNTER — Telehealth: Payer: Self-pay | Admitting: Obstetrics and Gynecology

## 2018-09-10 NOTE — Telephone Encounter (Signed)
Spoke with patient, advised as seen below per Dr. Quincy Simmonds. Rx for flagyl PO to verified pharmacy. Patient states she is on Augmentin for sinus infection, will complete on 9/27. Advised no interactions, will review with Dr. Quincy Simmonds and return call if additional recommendations. Patient agreeable.    Notes recorded by Nunzio Cobbs, MD on 09/04/2018 at 5:26 AM EDT Please inform of Affirm result showing bacterial vaginosis. She may treat with Flagyl 500 mg po bid for 7 days or Metrogel pv at hs for 5 nights.  Please send Rx to pharmacy of choice. ETOH precautions.   RX pended for Flagyl.   Dr. Quincy Simmonds -please review, ok to start flagyl.

## 2018-09-10 NOTE — Telephone Encounter (Signed)
Ok for Flagyl.  She may choose to take this now or wait until she completes the Augmentin.

## 2018-09-10 NOTE — Telephone Encounter (Signed)
Patient saw her results in [place] and want to review them with a nurse. She said one result was positive and she'd like to know if she needs a prescription.

## 2018-09-11 MED ORDER — METRONIDAZOLE 500 MG PO TABS: 500.0000 mg | ORAL_TABLET | Freq: Two times a day (BID) | ORAL | 0 refills | Status: DC

## 2018-09-11 NOTE — Telephone Encounter (Signed)
Spoke with patient, advised as seen below per Dr. Quincy Simmonds. Patient verbalizes understanding and is agreeable.   Flagyl PO to pharmacy.   Encounter closed.

## 2018-09-17 ENCOUNTER — Other Ambulatory Visit: Payer: Self-pay

## 2018-09-17 ENCOUNTER — Ambulatory Visit: Payer: 59 | Admitting: Family Medicine

## 2018-09-17 ENCOUNTER — Encounter: Payer: Self-pay | Admitting: Cardiology

## 2018-09-17 ENCOUNTER — Encounter: Payer: Self-pay | Admitting: Family Medicine

## 2018-09-17 ENCOUNTER — Ambulatory Visit: Payer: 59 | Admitting: Cardiology

## 2018-09-17 VITALS — BP 100/68 | HR 75 | Ht 65.0 in | Wt 189.2 lb

## 2018-09-17 VITALS — BP 128/64 | HR 72 | Temp 98.2°F | Resp 14 | Ht 65.0 in | Wt 189.0 lb

## 2018-09-17 DIAGNOSIS — I10 Essential (primary) hypertension: Secondary | ICD-10-CM | POA: Diagnosis not present

## 2018-09-17 DIAGNOSIS — R079 Chest pain, unspecified: Secondary | ICD-10-CM | POA: Diagnosis not present

## 2018-09-17 DIAGNOSIS — R0609 Other forms of dyspnea: Secondary | ICD-10-CM | POA: Diagnosis not present

## 2018-09-17 DIAGNOSIS — E782 Mixed hyperlipidemia: Secondary | ICD-10-CM

## 2018-09-17 DIAGNOSIS — J0191 Acute recurrent sinusitis, unspecified: Secondary | ICD-10-CM | POA: Diagnosis not present

## 2018-09-17 DIAGNOSIS — R0600 Dyspnea, unspecified: Secondary | ICD-10-CM | POA: Insufficient documentation

## 2018-09-17 DIAGNOSIS — Z01818 Encounter for other preprocedural examination: Secondary | ICD-10-CM

## 2018-09-17 MED ORDER — METOPROLOL TARTRATE 50 MG PO TABS: 50.0000 mg | ORAL_TABLET | Freq: Once | ORAL | 0 refills | Status: DC

## 2018-09-17 NOTE — Progress Notes (Signed)
PCP: Alycia Rossetti, MD  Clinic Note: Chief Complaint  Patient presents with  . New Patient (Initial Visit)    PCP referral, and hospital follow-up  . Shortness of Breath    when active  . Chest Pain    HPI: Mary Robles is a 42 y.o. female who is being seen today for the evaluation of CP with labile BP at the request of Delsa Grana, PA-C.  Mary Robles was last seen on 8/6 by Ms. Tapia c/o CP (? Bronchitis)  Recent Hospitalizations:   ER 9/16: HA & CP -- HTN Emergency. BP 187/123  Studies Personally Reviewed - (if available, images/films reviewed: From Epic Chart or Care Everywhere)  None available  Interval History: Mary Robles presents here for cardiology evaluation.  She has been noticing that her recurrent sinusitis led to a headache for which she took Excedrin on the day that she went to the emergency room with severe hypertension.  Otherwise, her blood pressure has been relatively controlled.  She takes her clonidine routinely and does not miss doses. She says that over the last week or so, she is been noticing occasional chest tightness, almost like a sharp squeezing sensation in the middle of her chest.  She notes that sometimes when walking upstairs, but it can also occur at other times that are not necessarily with exertion.  She has not really been noticing a lot of wheezing which often occurs with her bronchitis, but has been coughing some. She had not thought to use her inhaler to see if this would help, but says that they did not feel like her usual breathing issues. She describes the pain In the upper part of the central chest -not more than maybe 4-5/10 at worst.  Usually does not last very long, but she is not too sure how long (probably not more than 5 minutes.). She does occasionally get short of breath when she goes up and down stairs but does not do routine.  She denies any heart failure symptoms of PND, orthopnea or edema.  Otherwise she denies any rapid  irregular heartbeats palpitations (with exception of occasionally feeling a heart rate going up fast right before she goes to bed) or syncope/near syncope.  No TIA or amaurosis fugax symptoms.  No claudication.  ROS: A comprehensive was performed. Review of Systems  Constitutional: Negative for chills, fever, malaise/fatigue and weight loss.  HENT: Positive for congestion and sinus pain.   Respiratory: Positive for cough and shortness of breath. Negative for hemoptysis, sputum production and wheezing.   Genitourinary: Negative for dysuria.  Musculoskeletal: Negative for falls, joint pain and myalgias.  Neurological: Positive for dizziness (Sometimes with headache and with sinus infection.) and headaches. Negative for focal weakness.  Psychiatric/Behavioral: The patient is not nervous/anxious.   All other systems reviewed and are negative.    I have reviewed and (if needed) personally updated the patient's problem list, medications, allergies, past medical and surgical history, social and family history.   Past Medical History:  Diagnosis Date  . Angio-edema   . Anxiety   . Asthma    no symptoms in "years", no inhaler  . Depression   . Herpes simplex   . Hyperlipidemia due to dietary fat intake   . Hyperlysinemia ([place])   . Hypertension   . Sickle cell trait ([place])   . Vitamin D deficiency     Past Surgical History:  Procedure Laterality Date  . ABDOMINAL HYSTERECTOMY N/A 05/04/2014   Procedure: HYSTERECTOMY ABDOMINAL with  BILATERAL SALPINGECTOMY ;  Surgeon: Jamey Reas de Berton Lan, MD;  Location: [facility];  Service: Gynecology;  Laterality: N/A;  . BREAST BIOPSY Right Jan. 2016   benign fibroadenoma  . FOOT SURGERY    . LAPAROTOMY N/A 05/04/2014   Procedure: REMOVAL OF ABD WALL MASS ;  Surgeon: Adin Hector, MD;  Location: [facility];  Service: General;  Laterality: N/A;  . LIPOMA EXCISION N/A 05/04/2014   Procedure: EXCISION LIPOMA of abdominal wall;  Surgeon: Jamey Reas de Berton Lan, MD;  Location: [facility];  Service: Gynecology;  Laterality: N/A;  . NASAL SINUS SURGERY  2019  . PELVIC LAPAROSCOPY Right 2018   oophorectomy  . TUBAL LIGATION      Current Meds  Medication Sig  . acetaminophen (TYLENOL) 500 MG tablet Take 1,000 mg by mouth every 6 (six) hours as needed (for pain or headaches).  Marland Kitchen albuterol (PROVENTIL HFA;VENTOLIN HFA) 108 (90 Base) MCG/ACT inhaler Inhale 2 puffs into the lungs every 6 (six) hours as needed for wheezing or shortness of breath.  . cloNIDine (CATAPRES) 0.1 MG tablet TAKE 1 TABLET BY MOUTH TWICE DAILY  . EPINEPHrine (EPIPEN 2-PAK) 0.3 mg/0.3 mL IJ SOAJ injection Inject 0.3 mg into the muscle once as needed (for an anaphylactic reaction).  . fluticasone (FLONASE) 50 MCG/ACT nasal spray Place 2 sprays into both nostrils daily as needed for allergies or rhinitis.   . hydrochlorothiazide (HYDRODIURIL) 25 MG tablet TAKE 1 TABLET BY MOUTH ONCE DAILY  . ibuprofen (ADVIL,MOTRIN) 200 MG tablet Take 600 mg by mouth every 6 (six) hours as needed (for pain or headaches).  Marland Kitchen levocetirizine (XYZAL) 5 MG tablet TAKE 1 TABLET BY MOUTH IN THE EVENING  . montelukast (SINGULAIR) 10 MG tablet Take 1 tablet (10 mg total) by mouth at bedtime.  Marland Kitchen nystatin cream (MYCOSTATIN) Apply 1 application topically 2 (two) times daily.  Marland Kitchen vortioxetine HBr (TRINTELLIX) 20 MG TABS Take 20 mg by mouth daily. (Patient taking differently: Take 20 mg by mouth at bedtime. )  . [DISCONTINUED] metroNIDAZOLE (FLAGYL) 500 MG tablet Take 1 tablet (500 mg total) by mouth 2 (two) times daily. (Patient not taking: Reported on 09/17/2018)    Allergies  Allergen Reactions  . Vicodin [Hydrocodone-Acetaminophen] Other (See Comments)    Hallucinations   . Benazepril Swelling    Lips and face became swollen  . Betadine [Povidone Iodine] Other (See Comments)    Blisters   . Other Hives and Itching    [place] nuts     Social History   Tobacco Use  .  Smoking status: Never Smoker  . Smokeless tobacco: Never Used  Substance Use Topics  . Alcohol use: Yes    Alcohol/week: 0.0 standard drinks    Comment: 2-3 x/year  . Drug use: No   Social History   Social History Narrative   Divorced mother of 2.  Lives with her son.  Works in Science writer work.  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    family history includes Diabetes in her maternal grandmother and mother; Mental retardation in her maternal aunt; Stroke in her father; Thyroid disease in her mother.  Wt Readings from Last 3 Encounters:  09/17/18 189 lb (85.7 kg)  09/17/18 189 lb 3.2 oz (85.8 kg)  09/03/18 187 lb (84.8 kg)    PHYSICAL EXAM BP 100/68   Pulse 75   Ht 5\' 5"  (1.651 m)   Wt 189 lb 3.2 oz (85.8 kg)   LMP 04/17/2014  BMI 31.48 kg/m  Physical Exam  Constitutional: She is oriented to person, place, and time. She appears well-developed and well-nourished. No distress.  Mildly obese.  Well-groomed  HENT:  Head: Normocephalic and atraumatic.  Mouth/Throat: Oropharynx is clear and moist.  Eyes: Pupils are equal, round, and reactive to light. Conjunctivae and EOM are normal. No scleral icterus.  Neck: Normal range of motion. Neck supple. No hepatojugular reflux and no JVD present. Carotid bruit is not present.  Cardiovascular: Normal rate, regular rhythm, normal heart sounds and intact distal pulses.  No extrasystoles are present. PMI is not displaced. Exam reveals no gallop and no friction rub.  No murmur heard. Pulmonary/Chest: Effort normal and breath sounds normal. No respiratory distress. She has no wheezes.  Abdominal: Soft. Bowel sounds are normal. She exhibits no distension. There is no tenderness. There is no rebound and no guarding.  No HSM.  Musculoskeletal: Normal range of motion. She exhibits no edema.  Neurological: She is oriented to person, place, and time. No cranial nerve deficit.  Skin: Skin is warm and dry. No erythema.  Psychiatric: She has a normal mood and  affect. Her behavior is normal. Judgment and thought content normal.  Vitals reviewed.    Adult ECG Report  Rate: 75;  Rhythm: normal sinus rhythm and Normal axis, intervals and durations.  Poor R wave progression would suggest possible anterior MI, age undetermined.  Nonspecific.;   Narrative Interpretation: Stable EKG   Other studies Reviewed: Additional studies/ records that were reviewed today include:  Recent Labs:   Lab Results  Component Value Date   CREATININE 0.99 09/01/2018   BUN 8 09/01/2018   NA 140 09/01/2018   K 3.3 (L) 09/01/2018   CL 102 09/01/2018   CO2 26 09/01/2018   Lab Results  Component Value Date   CHOL 187 07/07/2018   HDL 42 (L) 07/07/2018   LDLCALC 123 (H) 07/07/2018   TRIG 116 07/07/2018   CHOLHDL 4.5 07/07/2018   Lab Results  Component Value Date   TSH 1.97 03/26/2017   No results found for: HGBA1C  ASSESSMENT / PLAN: Problem List Items Addressed This Visit    Chest pain with moderate risk for cardiac etiology - Primary    Chest pain is worse with exertion associate with dyspnea.  With her hyperlipidemia and hypertension, she does have risk factors.  She also has history of stroke and heart attack in her family that seems to be relatively younger ages.  I would like to do a baseline cardiac evaluation, especially to see how aggressively we need to treat her lipids.  We can also evaluate for ischemia.  Plan: Anatomic and Ischemic Evaluation with Coronary Calcium Score and Coronary CT Angiogram.      Relevant Orders   EKG 12-Lead (Completed)   Basic metabolic panel   CT CORONARY MORPH W/CTA COR W/SCORE W/CA W/CM &/OR WO/CM   CT CORONARY FRACTIONAL FLOW RESERVE DATA PREP   CT CORONARY FRACTIONAL FLOW RESERVE FLUID ANALYSIS   DOE (dyspnea on exertion)    Again associated with chest pain that could be exertional angina (albeit atypical).  Plan: Since this is associated with chest discomfort/pain, evaluate coronary CT angiogram.       Relevant Orders   EKG 12-Lead (Completed)   Basic metabolic panel   CT CORONARY MORPH W/CTA COR W/SCORE W/CA W/CM &/OR WO/CM   CT CORONARY FRACTIONAL FLOW RESERVE DATA PREP   CT CORONARY FRACTIONAL FLOW RESERVE FLUID ANALYSIS   Hyperlipidemia (Chronic)  This is being followed by PCP.  She should be due for a follow-up check soon.  Pending results of coronary CT angiogram, we may want to be much more aggressive with lipid management.  I suspect that she may need a statin.      Hypertension (Chronic)    Blood pressure actually looks if anything a little low today.  She is on HCTZ and clonidine.  I would love to change her from clonidine to something different but we will need to see if there is any concern for coronary disease.   Clearly would not use ACE inhibitor or ARB because of history of angioedema.       Other Visit Diagnoses    Pre-op testing       Relevant Orders   Basic metabolic panel       I spent a total of 25minutes with the patient and chart review. >  50% of the time was spent in direct patient consultation.  Complete history and physical exam was performed and history updated.  Current medicines are reviewed at length with the patient today.  (+/- concerns) none The following changes have been made:  See below  Patient Instructions  MEDICATION INSTRUCTIONS   NO CHANGES   SCHEDULE AT [place] physician has requested that you have cardiac coronary CT.   Studies Ordered:   Orders Placed This Encounter  Procedures  . CT CORONARY MORPH W/CTA COR W/SCORE W/CA W/CM &/OR WO/CM  . CT CORONARY FRACTIONAL FLOW RESERVE DATA PREP  . CT CORONARY FRACTIONAL FLOW RESERVE FLUID ANALYSIS  . Basic metabolic panel  . EKG 12-Lead   Your physician recommends that you schedule a follow-up appointment in 2 months with DR Lexys Milliner.   Mary Robles, M.D., M.S. Interventional Cardiologist   Pager # [phone] Phone # [phone] [address] [place], [place] [address]   Thank you for choosing [facility] at [facility]!!

## 2018-09-17 NOTE — Patient Instructions (Signed)
MEDICATION INSTRUCTIONS   NO CHANGES   SCHEDULE AT [place]  Your physician has requested that you have cardiac coronary CT. Cardiac computed tomography (CT) is a painless test that uses an x-ray machine to take clear, detailed pictures of your heart. For further information please visit [email or web address]. Please follow instruction sheet as given.    Your physician recommends that you schedule a follow-up appointment in 2 months with DR HARDING.      INSTRUCTIONS FOR  CORONARY CTA    Please arrive at the [facility] main entrance of [facility] at (30-45 minutes prior to test start time)  [facility] [place], [place] [address] [phone]  Proceed to the [facility] Radiology Department (First Floor).  Please follow these instructions carefully (unless otherwise directed):  PLEASE HAVE LABS - BMP  AT LEAST ONE WEEK PRIOR TO TEST  On the Night Before the Test: . Drink plenty of water. . Do not consume any caffeinated/decaffeinated beverages or chocolate 12 hours prior to your test. . Do not take any antihistamines 12 hours prior to your test.    On the Day of the Test: . Drink plenty of water. Do not drink any water within one hour of the test. . Do not eat any food 4 hours prior to the test. . You may take your regular medications prior to the test. . IF NOT ON A BETA BLOCKER - Take 50 mg of lopressor (metoprolol) one hour before the test.   After the Test: . Drink plenty of water. . After receiving IV contrast, you may experience a mild flushed feeling. This is normal. . On occasion, you may experience a mild rash up to 24 hours after the test. This is not dangerous. If this occurs, you can take Benadryl 25 mg and increase your fluid intake. . If you experience trouble breathing, this can be serious. If it is severe call 911 IMMEDIATELY. If it is mild, please call our office.

## 2018-09-17 NOTE — Progress Notes (Signed)
Patient ID: Mary Robles, female    DOB: [birth year], 42 y.o.   MRN: [id number]  PCP: Alycia Rossetti, MD  Chief Complaint  Patient presents with  . Follow-up    BP    Subjective:   Mary Robles is a 42 y.o. female, presents to clinic with CC of recurrent sinusitis with headaches and elevated blood pressure, here for recheck of her blood pressure today.  She did complete a Augmentin course of antibiotics with steroids, started after last visit on 09/03/2018, does state that her sinusitis is improving but still has 6 out of 10 sinus pain and pressure, and she does not feel all the way back to baseline.  She denies severe headaches, fever, chills, sweats, body aches, nausea or vomiting, cough, wheeze or chills.  She does feel postnasal drainage, no ear pain.  She is doing saline nasal sprays and antihistamines but she has not taken antihistamine and Singulair at the same time.  Regarding her blood pressure she states that her blood pressure has been better than it was previously when she was in the ER and ill.  In the morning it still is higher, in the 269S with diastolic in the 85I or sometimes low 100s.  Tend to be higher when she first wakes up and then is normal to low after taking her morning medications.  She is on clonidine and HCTZ.  D discussed clonidine medicine last time and possibly changing to another medicine.  She states that she did see her cardiologist this morning and he had the same suggestions that he does not really like clonidine.  She is pending some cardiac tests that he states he will review and adjust medications based on those results so we will defer to cardiology at this time.  Her blood pressure today in clinic is 128/64.  Radiologist office earlier today blood pressures were 100/68 and 104/70.  She denies any headache, vertigo, visual disturbances, chest pain, lower extremity edema.  She does have unchanged shortness of breath that is being worked up by  cardiology.   Patient Active Problem List   Diagnosis Date Noted  . Chest pain with moderate risk for cardiac etiology 09/17/2018  . DOE (dyspnea on exertion) 09/17/2018  . Obesity (BMI 30.0-34.9) 07/02/2017  . MDD (major depressive disorder) 07/16/2016  . Asthma, mild 04/11/2015  . GAD (generalized anxiety disorder) 02/08/2015  . Hyperlipidemia 07/09/2014  . Insomnia 07/09/2014  . Lipoma of abdominal wall s/p excision 05/04/2014 05/19/2014  . Status post total abdominal hysterectomy 05/04/2014  . Hypertension   . Vitamin D deficiency      Prior to Admission medications   Medication Sig Start Date End Date Taking? Authorizing Provider  acetaminophen (TYLENOL) 500 MG tablet Take 1,000 mg by mouth every 6 (six) hours as needed (for pain or headaches).   Yes [provider]  albuterol (PROVENTIL HFA;VENTOLIN HFA) 108 (90 Base) MCG/ACT inhaler Inhale 2 puffs into the lungs every 6 (six) hours as needed for wheezing or shortness of breath. 07/22/18  Yes Delsa Grana, PA-C  cloNIDine (CATAPRES) 0.1 MG tablet TAKE 1 TABLET BY MOUTH TWICE DAILY 04/14/18  Yes [place], Modena Nunnery, MD  EPINEPHrine (EPIPEN 2-PAK) 0.3 mg/0.3 mL IJ SOAJ injection Inject 0.3 mg into the muscle once as needed (for an anaphylactic reaction).   Yes [provider]  fluticasone (FLONASE) 50 MCG/ACT nasal spray Place 2 sprays into both nostrils daily as needed for allergies or rhinitis.  01/22/17  Yes [provider]  hydrochlorothiazide (HYDRODIURIL) 25 MG tablet TAKE 1 TABLET BY MOUTH ONCE DAILY 09/08/18  Yes [place], Modena Nunnery, MD  ibuprofen (ADVIL,MOTRIN) 200 MG tablet Take 600 mg by mouth every 6 (six) hours as needed (for pain or headaches).   Yes [provider]  levocetirizine (XYZAL) 5 MG tablet TAKE 1 TABLET BY MOUTH IN THE EVENING 08/14/17  Yes [place], Modena Nunnery, MD  metoprolol tartrate (LOPRESSOR) 50 MG tablet Take 1 tablet (50 mg total) by mouth once for 1 dose. TAKE ONE HOUR PRIOR TO   SCHEDULE CARDAIC TEST 09/17/18 09/17/18 Yes Leonie Man, MD  montelukast (SINGULAIR) 10 MG tablet Take 1 tablet (10 mg total) by mouth at bedtime. 09/03/18  Yes Delsa Grana, PA-C  nystatin cream (MYCOSTATIN) Apply 1 application topically 2 (two) times daily. 09/03/18  Yes [place] Raliegh Ip, MD  vortioxetine HBr (TRINTELLIX) 20 MG TABS Take 20 mg by mouth daily. Patient taking differently: Take 20 mg by mouth at bedtime.  01/03/18  Yes [place], Modena Nunnery, MD     Allergies  Allergen Reactions  . Vicodin [Hydrocodone-Acetaminophen] Other (See Comments)    Hallucinations   . Benazepril Swelling    Lips and face became swollen  . Betadine [Povidone Iodine] Other (See Comments)    Blisters   . Other Hives and Itching    [place] nuts      Family History  Problem Relation Age of Onset  . Diabetes Mother   . Thyroid disease Mother   . Stroke Father   . Mental retardation Maternal Aunt   . Diabetes Maternal Grandmother      Social History   Socioeconomic History  . Marital status: Legally Separated    Spouse name: Not on file  . Number of children: Not on file  . Years of education: Not on file  . Highest education level: Not on file  Occupational History  . Not on file  Social Needs  . Financial resource strain: Not on file  . Food insecurity:    Worry: Not on file    Inability: Not on file  . Transportation needs:    Medical: Not on file    Non-medical: Not on file  Tobacco Use  . Smoking status: Never Smoker  . Smokeless tobacco: Never Used  Substance and Sexual Activity  . Alcohol use: Yes    Alcohol/week: 0.0 standard drinks    Comment: 2-3 x/year  . Drug use: No  . Sexual activity: Yes    Partners: Male    Birth control/protection: Surgical    Comment: BTSP 2005/TAH/BSO  Lifestyle  . Physical activity:    Days per week: Not on file    Minutes per session: Not on file  . Stress: Not on file  Relationships  . Social connections:    Talks on  phone: Not on file    Gets together: Not on file    Attends religious service: Not on file    Active member of club or organization: Not on file    Attends meetings of clubs or organizations: Not on file    Relationship status: Not on file  . Intimate partner violence:    Fear of current or ex partner: Not on file    Emotionally abused: Not on file    Physically abused: Not on file    Forced sexual activity: Not on file  Other Topics Concern  . Not on file  Social History Narrative  . Not on file  Review of Systems  Constitutional: Negative.   HENT: Negative.   Eyes: Negative.   Respiratory: Negative.  Negative for cough and chest tightness.   Cardiovascular: Negative.   Gastrointestinal: Negative.   Endocrine: Negative.   Genitourinary: Negative.   Musculoskeletal: Negative.   Skin: Negative.   Allergic/Immunologic: Positive for environmental allergies. Negative for immunocompromised state.  Neurological: Negative.   Hematological: Negative.   Psychiatric/Behavioral: Negative.   All other systems reviewed and are negative.      Objective:    Vitals:   09/17/18 1118  BP: 128/64  Pulse: 72  Resp: 14  Temp: 98.2 F (36.8 C)  TempSrc: Oral  SpO2: 98%  Weight: 189 lb (85.7 kg)  Height: 5\' 5"  (1.651 m)      Physical Exam  Constitutional: She is oriented to person, place, and time. She appears well-developed and well-nourished. No distress.  HENT:  Head: Normocephalic and atraumatic. Head is without right periorbital erythema and without left periorbital erythema.  Right Ear: Hearing, tympanic membrane, external ear and ear canal normal.  Left Ear: Hearing, tympanic membrane, external ear and ear canal normal.  Nose: Mucosal edema and rhinorrhea present. Right sinus exhibits no maxillary sinus tenderness and no frontal sinus tenderness. Left sinus exhibits no maxillary sinus tenderness and no frontal sinus tenderness.  Mouth/Throat: Uvula is midline,  oropharynx is clear and moist and mucous membranes are normal. Mucous membranes are not pale, not dry and not cyanotic. No oropharyngeal exudate, posterior oropharyngeal edema, posterior oropharyngeal erythema or tonsillar abscesses.  Eyes: Pupils are equal, round, and reactive to light. Conjunctivae and EOM are normal. Right eye exhibits no discharge. Left eye exhibits no discharge. No scleral icterus.  Neck: Normal range of motion. Neck supple. No tracheal deviation present.  Cardiovascular: Normal rate, regular rhythm, normal heart sounds and intact distal pulses. Exam reveals no gallop and no friction rub.  No murmur heard. Pulmonary/Chest: Effort normal and breath sounds normal. No respiratory distress.  Abdominal: Soft. She exhibits no distension.  Musculoskeletal: Normal range of motion.  Lymphadenopathy:       Head (right side): No submental, no submandibular, no tonsillar, no preauricular, no posterior auricular and no occipital adenopathy present.       Head (left side): No submental, no submandibular, no tonsillar, no preauricular, no posterior auricular and no occipital adenopathy present.    She has no cervical adenopathy.  Neurological: She is alert and oriented to person, place, and time. She exhibits normal muscle tone. Coordination normal.  Skin: Skin is warm and dry. Capillary refill takes less than 2 seconds. No rash noted. She is not diaphoretic. No pallor.  Psychiatric: She has a normal mood and affect. Her behavior is normal.  Nursing note and vitals reviewed.         Assessment & Plan:      ICD-10-CM   1. Acute recurrent sinusitis, unspecified location J01.91    Improving after antibiotics, encourage compliance to sinusitis treatment including daily antihistamines, steroid nasal sprays, saline rinses, and montelukast  2. Essential hypertension I10    Improved today after illness has improved, still would like to get off clonidine, cardiology is involved in  management will defer to them    Patient referred back to ENT, Dr. Wilburn Cornelia for recurrent sinusitis, with multiple recurrent infections following recent sinus surgery.   Delsa Grana, PA-C 09/17/18 11:29 AM

## 2018-09-18 ENCOUNTER — Encounter: Payer: Self-pay | Admitting: Cardiology

## 2018-09-18 NOTE — Assessment & Plan Note (Signed)
Again associated with chest pain that could be exertional angina (albeit atypical).  Plan: Since this is associated with chest discomfort/pain, evaluate coronary CT angiogram.

## 2018-09-18 NOTE — Assessment & Plan Note (Signed)
Chest pain is worse with exertion associate with dyspnea.  With her hyperlipidemia and hypertension, she does have risk factors.  She also has history of stroke and heart attack in her family that seems to be relatively younger ages.  I would like to do a baseline cardiac evaluation, especially to see how aggressively we need to treat her lipids.  We can also evaluate for ischemia.  Plan: Anatomic and Ischemic Evaluation with Coronary Calcium Score and Coronary CT Angiogram.

## 2018-09-18 NOTE — Assessment & Plan Note (Signed)
Blood pressure actually looks if anything a little low today.  She is on HCTZ and clonidine.  I would love to change her from clonidine to something different but we will need to see if there is any concern for coronary disease.   Clearly would not use ACE inhibitor or ARB because of history of angioedema.

## 2018-09-18 NOTE — Assessment & Plan Note (Signed)
This is being followed by PCP.  She should be due for a follow-up check soon.  Pending results of coronary CT angiogram, we may want to be much more aggressive with lipid management.  I suspect that she may need a statin.

## 2018-10-06 ENCOUNTER — Ambulatory Visit: Payer: 59 | Admitting: Interventional Cardiology

## 2018-10-20 ENCOUNTER — Other Ambulatory Visit: Payer: Self-pay | Admitting: Family Medicine

## 2018-10-21 DIAGNOSIS — Z01818 Encounter for other preprocedural examination: Secondary | ICD-10-CM | POA: Diagnosis not present

## 2018-10-21 DIAGNOSIS — R0609 Other forms of dyspnea: Secondary | ICD-10-CM | POA: Diagnosis not present

## 2018-10-21 DIAGNOSIS — R079 Chest pain, unspecified: Secondary | ICD-10-CM | POA: Diagnosis not present

## 2018-10-21 LAB — BASIC METABOLIC PANEL
BUN/Creatinine Ratio: 8 — ABNORMAL LOW (ref 9–23)
BUN: 7 mg/dL (ref 6–24)
CALCIUM: 9.3 mg/dL (ref 8.7–10.2)
CO2: 24 mmol/L (ref 20–29)
CREATININE: 0.86 mg/dL (ref 0.57–1.00)
Chloride: 102 mmol/L (ref 96–106)
GFR, EST AFRICAN AMERICAN: 96 mL/min/{1.73_m2} (ref >59)
GFR, EST NON AFRICAN AMERICAN: 83 mL/min/{1.73_m2} (ref >59)
Glucose: 76 mg/dL (ref 65–99)
Potassium: 4.5 mmol/L (ref 3.5–5.2)
SODIUM: 140 mmol/L (ref 134–144)

## 2018-10-24 ENCOUNTER — Ambulatory Visit (HOSPITAL_COMMUNITY)
Admission: RE | Admit: 2018-10-24 | Discharge: 2018-10-24 | Disposition: A | Discharge status: Home / Self Care | Payer: 59 | Source: Ambulatory Visit | Attending: Cardiology | Admitting: Cardiology

## 2018-10-24 ENCOUNTER — Ambulatory Visit (HOSPITAL_COMMUNITY): Admission: RE | Admit: 2018-10-24 | Payer: 59 | Source: Ambulatory Visit

## 2018-10-24 DIAGNOSIS — R079 Chest pain, unspecified: Secondary | ICD-10-CM | POA: Insufficient documentation

## 2018-10-24 DIAGNOSIS — R0600 Dyspnea, unspecified: Secondary | ICD-10-CM

## 2018-10-24 DIAGNOSIS — R0609 Other forms of dyspnea: Secondary | ICD-10-CM

## 2018-10-24 DIAGNOSIS — K449 Diaphragmatic hernia without obstruction or gangrene: Secondary | ICD-10-CM | POA: Diagnosis not present

## 2018-10-24 MED ORDER — IOPAMIDOL (ISOVUE-370) INJECTION 76%
100.0000 mL | Freq: Once | INTRAVENOUS | Status: AC | PRN
  Administered 2018-10-24: 100 mL via INTRAVENOUS

## 2018-10-24 MED ORDER — NITROGLYCERIN 0.4 MG SL SUBL
0.8000 mg | SUBLINGUAL_TABLET | SUBLINGUAL | Status: DC | PRN
  Administered 2018-10-24: 0.8 mg via SUBLINGUAL
  Filled 2018-10-24: qty 25

## 2018-10-24 MED ORDER — NITROGLYCERIN 0.4 MG SL SUBL
SUBLINGUAL_TABLET | SUBLINGUAL | Status: AC
  Administered 2018-10-24: 0.8 mg via SUBLINGUAL
  Filled 2018-10-24: qty 2

## 2018-11-10 ENCOUNTER — Other Ambulatory Visit: Payer: Self-pay | Admitting: Family Medicine

## 2018-11-11 ENCOUNTER — Encounter: Payer: Self-pay | Admitting: Family Medicine

## 2018-11-19 ENCOUNTER — Ambulatory Visit: Payer: 59 | Admitting: Cardiology

## 2018-11-19 NOTE — Progress Notes (Signed)
Cardiology Office Note   Date:  11/19/2018   ID:  Mary Robles, DOB 1976/07/19, MRN [id number]  PCP:  Alycia Rossetti, MD  Cardiologist: Dr. Ellyn Hack  Chief Complaint: Follow up    History of Present Illness: Mary Robles is a 42 y.o. female who presents for ongoing assessment and management of chest pain, labile BP. Was last seen by Dr Ellyn Hack on 09/17/2018 with these complaints along with shortness of breath, especially when climbing stairs. She was sent for cardiac CTA for coronary calcium score. CTA completed on 10/24/2018 demonstrated a score of 0, suggesting low risk for future cardiac events. No significant coronary disease was noted,   She is here without cardiac complaints. She climbed stairs while carrying a book bag, her child and a car seat without chest pain or dyspnea. BP at home is normal.She is medically compliant.   Past Medical History:  Diagnosis Date  . Angio-edema   . Anxiety   . Asthma    no symptoms in "years", no inhaler  . Depression   . Herpes simplex   . Hyperlipidemia due to dietary fat intake   . Hyperlysinemia ([place])   . Hypertension   . Sickle cell trait ([place])   . Vitamin D deficiency     Past Surgical History:  Procedure Laterality Date  . ABDOMINAL HYSTERECTOMY N/A 05/04/2014   Procedure: HYSTERECTOMY ABDOMINAL with BILATERAL SALPINGECTOMY ;  Surgeon: Everardo All Amundson de Berton Lan, MD;  Location: [facility];  Service: Gynecology;  Laterality: N/A;  . BREAST BIOPSY Right Jan. 2016   benign fibroadenoma  . FOOT SURGERY    . LAPAROTOMY N/A 05/04/2014   Procedure: REMOVAL OF ABD WALL MASS ;  Surgeon: Adin Hector, MD;  Location: [facility];  Service: General;  Laterality: N/A;  . LIPOMA EXCISION N/A 05/04/2014   Procedure: EXCISION LIPOMA of abdominal wall;  Surgeon: Jamey Reas de Berton Lan, MD;  Location: [facility];  Service: Gynecology;  Laterality: N/A;  . NASAL SINUS SURGERY  2019  . PELVIC LAPAROSCOPY Right 2018   oophorectomy  .  TUBAL LIGATION       Current Outpatient Medications  Medication Sig Dispense Refill  . acetaminophen (TYLENOL) 500 MG tablet Take 1,000 mg by mouth every 6 (six) hours as needed (for pain or headaches).    Marland Kitchen albuterol (PROVENTIL HFA;VENTOLIN HFA) 108 (90 Base) MCG/ACT inhaler Inhale 2 puffs into the lungs every 6 (six) hours as needed for wheezing or shortness of breath. 1 Inhaler 2  . cloNIDine (CATAPRES) 0.1 MG tablet TAKE 1 TABLET BY MOUTH TWICE DAILY 180 tablet 1  . EPINEPHrine (EPIPEN 2-PAK) 0.3 mg/0.3 mL IJ SOAJ injection Inject 0.3 mg into the muscle once as needed (for an anaphylactic reaction).    . fluticasone (FLONASE) 50 MCG/ACT nasal spray Place 2 sprays into both nostrils daily as needed for allergies or rhinitis.     . hydrochlorothiazide (HYDRODIURIL) 25 MG tablet TAKE 1 TABLET BY MOUTH ONCE DAILY 30 tablet 6  . ibuprofen (ADVIL,MOTRIN) 200 MG tablet Take 600 mg by mouth every 6 (six) hours as needed (for pain or headaches).    Marland Kitchen levocetirizine (XYZAL) 5 MG tablet TAKE 1 TABLET BY MOUTH IN THE EVENING 90 tablet 3  . metoprolol tartrate (LOPRESSOR) 50 MG tablet Take 1 tablet (50 mg total) by mouth once for 1 dose. TAKE ONE HOUR PRIOR TO  SCHEDULE CARDAIC TEST 1 tablet 0  . montelukast (SINGULAIR) 10 MG tablet Take  1 tablet (10 mg total) by mouth at bedtime. 30 tablet 3  . nystatin cream (MYCOSTATIN) Apply 1 application topically 2 (two) times daily. 30 g 0  . TRINTELLIX 20 MG TABS tablet TAKE 1 TABLET BY MOUTH ONCE DAILY DOSE  CHANGE* 90 tablet 2   No current facility-administered medications for this visit.     Allergies:   Vicodin [hydrocodone-acetaminophen]; Benazepril; Betadine [povidone iodine]; and Other    Social History:  The patient  reports that she has never smoked. She has never used smokeless tobacco. She reports that she drinks alcohol. She reports that she does not use drugs.   Family History:  The patient's family history includes Diabetes in her maternal  grandmother and mother; Mental retardation in her maternal aunt; Stroke in her father; Thyroid disease in her mother.    ROS: All other systems are reviewed and negative. Unless otherwise mentioned in H&P    PHYSICAL EXAM: VS:  LMP 04/17/2014  , BMI There is no height or weight on file to calculate BMI. GEN: Well nourished, well developed, in no acute distress HEENT: Sinus drainage  Neck: no JVD, carotid bruits, or masses Cardiac: RRR; no murmurs, rubs, or gallops,no edema  Respiratory:  Clear to auscultation bilaterally, normal work of breathing GI: soft, nontender, nondistended, + BS MS: no deformity or atrophy Skin: warm and dry, no rash Neuro:  Strength and sensation are intact Psych: euthymic mood, full affect   EKG:  Not completed this office visit  Recent Labs: 07/07/2018: ALT 19 09/01/2018: Hemoglobin 13.9; Platelets 346 10/21/2018: BUN 7; Creatinine, Ser 0.86; Potassium 4.5; Sodium 140    Lipid Panel    Component Value Date/Time   CHOL 187 07/07/2018 0951   TRIG 116 07/07/2018 0951   HDL 42 (L) 07/07/2018 0951   CHOLHDL 4.5 07/07/2018 0951   VLDL 23 03/26/2017 0859   LDLCALC 123 (H) 07/07/2018 0951      Wt Readings from Last 3 Encounters:  09/17/18 189 lb (85.7 kg)  09/17/18 189 lb 3.2 oz (85.8 kg)  09/03/18 187 lb (84.8 kg)      Other studies Reviewed: Coronary CTA 10/24/2018  cardiac: See separate report from [facility].  Pulmonary veins drain normally to the left atrium.  Calcium Score: 0 Agatston units.  Coronary Arteries: Right dominant with no anomalies  LM: No plaque or stenosis.  LAD system:  No plaque or stenosis.  Circumflex system: No plaque or stenosis.  RCA system:  No plaque or stenosis.  IMPRESSION: 1. Coronary artery calcium score 0 Agatston units, suggesting low risk for future cardiac events.  2.  No significant coronary disease noted.  ASSESSMENT AND PLAN:  1. Chest Pain: Coronary CTA calcium score of  0. This has been discussed with her and reassurance given.   2. Hypertension: BP is well controlled now on clonidine, HCTZ and metoprolol. No changes at this time.   Current medicines are reviewed at length with the patient today.    Labs/ tests ordered today include: None   Phill Myron. West Pugh, ANP, AACC   11/19/2018 8:27 AM    [facility] [address] Office [phone] Fax [phone]

## 2018-11-20 ENCOUNTER — Encounter: Payer: Self-pay | Admitting: Adult Health

## 2018-11-20 ENCOUNTER — Ambulatory Visit (INDEPENDENT_AMBULATORY_CARE_PROVIDER_SITE_OTHER): Payer: 59 | Admitting: Adult Health

## 2018-11-20 VITALS — BP 130/98 | HR 91 | Wt 190.0 lb

## 2018-11-20 DIAGNOSIS — R0609 Other forms of dyspnea: Secondary | ICD-10-CM | POA: Diagnosis not present

## 2018-11-20 DIAGNOSIS — I10 Essential (primary) hypertension: Secondary | ICD-10-CM | POA: Diagnosis not present

## 2018-11-20 DIAGNOSIS — R0600 Dyspnea, unspecified: Secondary | ICD-10-CM

## 2019-01-09 ENCOUNTER — Ambulatory Visit: Payer: 59 | Admitting: Family Medicine

## 2019-01-09 ENCOUNTER — Other Ambulatory Visit: Payer: Self-pay

## 2019-01-09 ENCOUNTER — Encounter: Payer: Self-pay | Admitting: Family Medicine

## 2019-01-09 VITALS — BP 130/82 | HR 96 | Temp 99.2°F | Resp 14 | Ht 65.0 in | Wt 192.0 lb

## 2019-01-09 DIAGNOSIS — E66811 Obesity, class 1: Secondary | ICD-10-CM

## 2019-01-09 DIAGNOSIS — I10 Essential (primary) hypertension: Secondary | ICD-10-CM | POA: Diagnosis not present

## 2019-01-09 DIAGNOSIS — D229 Melanocytic nevi, unspecified: Secondary | ICD-10-CM

## 2019-01-09 DIAGNOSIS — E669 Obesity, unspecified: Secondary | ICD-10-CM | POA: Diagnosis not present

## 2019-01-09 DIAGNOSIS — J4520 Mild intermittent asthma, uncomplicated: Secondary | ICD-10-CM

## 2019-01-09 DIAGNOSIS — J069 Acute upper respiratory infection, unspecified: Secondary | ICD-10-CM

## 2019-01-09 DIAGNOSIS — E782 Mixed hyperlipidemia: Secondary | ICD-10-CM

## 2019-01-09 MED ORDER — ALBUTEROL SULFATE HFA 108 (90 BASE) MCG/ACT IN AERS: 2.0000 | INHALATION_SPRAY | Freq: Four times a day (QID) | RESPIRATORY_TRACT | 2 refills | Status: DC | PRN

## 2019-01-09 MED ORDER — MONTELUKAST SODIUM 10 MG PO TABS: 10.0000 mg | ORAL_TABLET | Freq: Every day | ORAL | 3 refills | Status: DC

## 2019-01-09 MED ORDER — FLUTICASONE PROPIONATE 50 MCG/ACT NA SUSP: 2.0000 | Freq: Every day | NASAL | 2 refills | Status: DC | PRN

## 2019-01-09 MED ORDER — PREDNISONE 20 MG PO TABS: 40.0000 mg | ORAL_TABLET | Freq: Every day | ORAL | 0 refills | Status: DC

## 2019-01-09 MED ORDER — GUAIFENESIN-CODEINE 100-10 MG/5ML PO SOLN: 5.0000 mL | Freq: Four times a day (QID) | ORAL | 0 refills | Status: DC | PRN

## 2019-01-09 NOTE — Patient Instructions (Addendum)
Take cough medicine Use allergy meds Albuterol as needed We will call with lab results Try AMBI for skin lightening  F/U End of July for Physical

## 2019-01-09 NOTE — Addendum Note (Signed)
Addended by: Sheral Flow on: 01/09/2019 08:37 AM   Modules accepted: Orders

## 2019-01-09 NOTE — Assessment & Plan Note (Signed)
Recheck lipids , discussed dietary changes

## 2019-01-09 NOTE — Assessment & Plan Note (Signed)
Currently with viral URI symptoms x 1 day, no asthma decompensation but often gets wheezing, chest tightness with colds, will send over prednisone to have on hand Robitussin codiene for cough, continue all asthma and allergy medications

## 2019-01-09 NOTE — Assessment & Plan Note (Signed)
Controlled ,reviewed cardiology note, no CAD

## 2019-01-09 NOTE — Addendum Note (Signed)
Addended by: Launa Grill on: 01/09/2019 03:57 PM   Modules accepted: Orders

## 2019-01-09 NOTE — Progress Notes (Signed)
   Subjective:    Patient ID: Mary Robles, female    DOB: 23-Aug-1976, 43 y.o.   MRN: [id number]  Patient presents for Follow-up (is fasting) and Illness (x2 weeks- nasal congestion, productive cough with yellow mucus started on 01/08/2019)   Pt here with cough and congestion for past 24 hours. No fever, no N/V. Has not taken any OTC medications. Needs refill on albuterol used last night , still taking her other allregy meds, saw ENT last summer . Cough keeping her up at night    HTN- was seen by cardoloigy has no CAD, bp controlled on HCTZ, metoprolol and clonidine    Due for repeat lipid check     Noticed skin discoloration on left forehead/temple region not sure if they were just new moles   Review Of Systems:  GEN- denies fatigue, fever, weight loss,weakness, recent illness HEENT- denies eye drainage, change in vision, nasal discharge, CVS- denies chest pain, palpitations RESP- denies SOB, cough, wheeze ABD- denies N/V, change in stools, abd pain GU- denies dysuria, hematuria, dribbling, incontinence MSK- denies joint pain, muscle aches, injury Neuro- denies headache, dizziness, syncope, seizure activity       Objective:    BP 130/82   Pulse 96   Temp 99.2 F (37.3 C) (Oral)   Resp 14   Ht 5\' 5"  (1.651 m)   Wt 192 lb (87.1 kg)   LMP 04/17/2014   SpO2 99%   BMI 31.95 kg/m  GEN- NAD, alert and oriented x3 HEENT- PERRL, EOMI, non injected sclera, pink conjunctiva, MMM, oropharynx clear Neck- Supple, no thyromegaly CVS- RRR, no murmur RESP-CTAB ABD-NABS,soft,NT,ND Skin - numerous hyperpigmented flat moles on face neck, left temple small ones surfacing  EXT- No edema Pulses- Radial, DP- 2+        Assessment & Plan:      Problem List Items Addressed This Visit      Unprioritized   Asthma, mild    Currently with viral URI symptoms x 1 day, no asthma decompensation but often gets wheezing, chest tightness with colds, will send over prednisone to have on  hand Robitussin codiene for cough, continue all asthma and allergy medications      Relevant Medications   albuterol (PROVENTIL HFA;VENTOLIN HFA) 108 (90 Base) MCG/ACT inhaler   montelukast (SINGULAIR) 10 MG tablet   predniSONE (DELTASONE) 20 MG tablet   Hyperlipidemia (Chronic)    Recheck lipids , discussed dietary changes      Hypertension (Chronic)    Controlled ,reviewed cardiology note, no CAD      Relevant Orders   CBC with Differential/Platelet   Comprehensive metabolic panel   Lipid panel   Obesity (BMI 30.0-34.9)    Other Visit Diagnoses    Viral URI    -  Primary   Numerous skin moles       benign moles on face, flat      Note: This dictation was prepared with Dragon dictation along with smaller phrase technology. Any transcriptional errors that result from this process are unintentional.

## 2019-01-10 LAB — COMPREHENSIVE METABOLIC PANEL
AG RATIO: 1.3 (calc) (ref 1.0–2.5)
ALKALINE PHOSPHATASE (APISO): 41 U/L (ref 33–115)
ALT: 15 U/L (ref 6–29)
AST: 15 U/L (ref 10–30)
Albumin: 3.7 g/dL (ref 3.6–5.1)
BUN: 9 mg/dL (ref 7–25)
CHLORIDE: 101 mmol/L (ref 98–110)
CO2: 25 mmol/L (ref 20–32)
CREATININE: 0.87 mg/dL (ref 0.50–1.10)
Calcium: 9.2 mg/dL (ref 8.6–10.2)
GLOBULIN: 2.9 g/dL (ref 1.9–3.7)
Glucose, Bld: 94 mg/dL (ref 65–99)
POTASSIUM: 4 mmol/L (ref 3.5–5.3)
Sodium: 137 mmol/L (ref 135–146)
Total Bilirubin: 0.7 mg/dL (ref 0.2–1.2)
Total Protein: 6.6 g/dL (ref 6.1–8.1)

## 2019-01-10 LAB — CBC WITH DIFFERENTIAL/PLATELET
ABSOLUTE MONOCYTES: 817 {cells}/uL (ref 200–950)
Basophils Absolute: 43 cells/uL (ref 0–200)
Basophils Relative: 0.6 %
EOS ABS: 99 {cells}/uL (ref 15–500)
Eosinophils Relative: 1.4 %
HCT: 41 % (ref 35.0–45.0)
Hemoglobin: 13.7 g/dL (ref 11.7–15.5)
Lymphs Abs: 1257 cells/uL (ref 850–3900)
MCH: 27.7 pg (ref 27.0–33.0)
MCHC: 33.4 g/dL (ref 32.0–36.0)
MCV: 82.8 fL (ref 80.0–100.0)
MONOS PCT: 11.5 %
MPV: 10.9 fL (ref 7.5–12.5)
NEUTROS PCT: 68.8 %
Neutro Abs: 4885 cells/uL (ref 1500–7800)
PLATELETS: 339 10*3/uL (ref 140–400)
RBC: 4.95 10*6/uL (ref 3.80–5.10)
RDW: 14.6 % (ref 11.0–15.0)
TOTAL LYMPHOCYTE: 17.7 %
WBC: 7.1 10*3/uL (ref 3.8–10.8)

## 2019-01-10 LAB — LIPID PANEL
CHOL/HDL RATIO: 4.2 (calc) (ref <5.0)
CHOLESTEROL: 157 mg/dL (ref <200)
HDL: 37 mg/dL — ABNORMAL LOW (ref >50)
LDL Cholesterol (Calc): 99 mg/dL (calc)
NON-HDL CHOLESTEROL (CALC): 120 mg/dL (ref <130)
Triglycerides: 118 mg/dL (ref <150)

## 2019-01-12 ENCOUNTER — Encounter: Payer: Self-pay | Admitting: *Deleted

## 2019-03-04 ENCOUNTER — Ambulatory Visit: Payer: 59 | Admitting: Family Medicine

## 2019-03-04 ENCOUNTER — Encounter: Payer: Self-pay | Admitting: Family Medicine

## 2019-03-04 ENCOUNTER — Other Ambulatory Visit: Payer: Self-pay

## 2019-03-04 VITALS — BP 134/70 | HR 82 | Temp 98.5°F | Resp 18 | Ht 65.0 in | Wt 192.8 lb

## 2019-03-04 DIAGNOSIS — J0141 Acute recurrent pansinusitis: Secondary | ICD-10-CM | POA: Diagnosis not present

## 2019-03-04 DIAGNOSIS — J45901 Unspecified asthma with (acute) exacerbation: Secondary | ICD-10-CM | POA: Diagnosis not present

## 2019-03-04 DIAGNOSIS — J309 Allergic rhinitis, unspecified: Secondary | ICD-10-CM | POA: Diagnosis not present

## 2019-03-04 MED ORDER — IPRATROPIUM BROMIDE 0.03 % NA SOLN: 2.0000 | Freq: Two times a day (BID) | NASAL | 12 refills | Status: DC

## 2019-03-04 MED ORDER — FLUCONAZOLE 150 MG PO TABS: 150.0000 mg | ORAL_TABLET | ORAL | 0 refills | Status: DC | PRN

## 2019-03-04 MED ORDER — LEVOCETIRIZINE DIHYDROCHLORIDE 5 MG PO TABS: 5.0000 mg | ORAL_TABLET | Freq: Every evening | ORAL | 1 refills | Status: DC

## 2019-03-04 MED ORDER — AMOXICILLIN-POT CLAVULANATE 875-125 MG PO TABS: 1.0000 | ORAL_TABLET | Freq: Two times a day (BID) | ORAL | 0 refills | Status: AC

## 2019-03-04 MED ORDER — MECLIZINE HCL 12.5 MG PO TABS: 12.5000 mg | ORAL_TABLET | Freq: Three times a day (TID) | ORAL | 0 refills | Status: DC | PRN

## 2019-03-04 NOTE — Progress Notes (Signed)
Patient ID: Mary Robles, female    DOB: Jan 04, 1976, 43 y.o.   MRN: [id number]  PCP: Mary Rossetti, MD  Chief Complaint  Patient presents with  . Sinusitis    nose and face pain, drainage, taking allergy meds    Subjective:   Mary Robles is a 43 y.o. female, presents to clinic with CC of worsening sinus pain and congestion, some dryness and she is tasting some blood or something weird in her postnasal drip, with blood tinged nasal drainage when she blows her nose.  The pain and pressure is in her forehead, in and around her eyes, and cheeks below her eyes.   She has no fever, sweats, chills or HA's.  She is using her other meds as prescribed - singulair, flonase, nasal saline 3 times a day, and her nasal sx continue to gradually worsen over the past week.  Her last antibiotics were last September.  Overall her nasal and sinus symptoms have been much better than prior to her ENT surgery last year.  She does require vaginal yeast infection treatment when she is taking antibiotics.  She also asks for mold testing today while she is here.  She states that her workplace they came in and taped off multiple large areas near where she works, this was not announced to them prior to [employer] cruise coming, apparently they were tearing out black mold.  She has had worsening nasal symptoms and wheeze using her inhaler more often ever since this happened.  She last used her rescue inhaler yesterday.  She is using about twice a day, she does not currently feel wheezy tight or short of breath.  She has not been having any productive cough or chest pain. She was seen by allergy specialist last year May 2019 The other day at her desk she had a very brief dizzy episode which she described as spinning.   No sick contacts.    Patient Active Problem List   Diagnosis Date Noted  . Chest pain with moderate risk for cardiac etiology 09/17/2018  . DOE (dyspnea on exertion) 09/17/2018  . Obesity (BMI  30.0-34.9) 07/02/2017  . MDD (major depressive disorder) 07/16/2016  . Asthma, mild 04/11/2015  . GAD (generalized anxiety disorder) 02/08/2015  . Hyperlipidemia 07/09/2014  . Insomnia 07/09/2014  . Lipoma of abdominal wall s/p excision 05/04/2014 05/19/2014  . Status post total abdominal hysterectomy 05/04/2014  . Hypertension   . Vitamin D deficiency      Prior to Admission medications   Medication Sig Start Date End Date Taking? Authorizing Provider  acetaminophen (TYLENOL) 500 MG tablet Take 1,000 mg by mouth every 6 (six) hours as needed (for pain or headaches).   Yes [provider]  albuterol (PROVENTIL HFA;VENTOLIN HFA) 108 (90 Base) MCG/ACT inhaler Inhale 2 puffs into the lungs every 6 (six) hours as needed for wheezing or shortness of breath. 01/09/19  Yes Neville, Modena Nunnery, MD  cloNIDine (CATAPRES) 0.1 MG tablet TAKE 1 TABLET BY MOUTH TWICE DAILY 10/20/18  Yes Reeder, Modena Nunnery, MD  EPINEPHrine (EPIPEN 2-PAK) 0.3 mg/0.3 mL IJ SOAJ injection Inject 0.3 mg into the muscle once as needed (for an anaphylactic reaction).   Yes [provider]  fluticasone (FLONASE) 50 MCG/ACT nasal spray Place 2 sprays into both nostrils daily as needed for allergies or rhinitis. 01/09/19  Yes , Modena Nunnery, MD  hydrochlorothiazide (HYDRODIURIL) 25 MG tablet TAKE 1 TABLET BY MOUTH ONCE DAILY 09/08/18  Yes [place], Hiawatha  F, MD  montelukast (SINGULAIR) 10 MG tablet Take 1 tablet (10 mg total) by mouth at bedtime. 01/09/19  Yes [place], Modena Nunnery, MD  guaiFENesin-codeine 100-10 MG/5ML syrup Take 5 mLs by mouth every 6 (six) hours as needed. Patient not taking: Reported on 03/04/2019 01/09/19   Mary Rossetti, MD  levocetirizine (XYZAL) 5 MG tablet TAKE 1 TABLET BY MOUTH IN THE EVENING Patient not taking: Reported on 03/04/2019 08/14/17   Mary Rossetti, MD  predniSONE (DELTASONE) 20 MG tablet Take 2 tablets (40 mg total) by mouth daily with breakfast. Patient not taking: Reported on  03/04/2019 01/09/19   Mary Rossetti, MD     Allergies  Allergen Reactions  . Vicodin [Hydrocodone-Acetaminophen] Other (See Comments)    Hallucinations   . Benazepril Swelling    Lips and face became swollen  . Betadine [Povidone Iodine] Other (See Comments)    Blisters   . Crab [Shellfish Allergy] Hives  . Other Hives and Itching    [place] nuts      Family History  Problem Relation Age of Onset  . Diabetes Mother   . Thyroid disease Mother   . Stroke Father   . Mental retardation Maternal Aunt   . Diabetes Maternal Grandmother      Social History   Socioeconomic History  . Marital status: Divorced    Spouse name: Not on file  . Number of children: 2  . Years of education: Not on file  . Highest education level: Bachelor's degree (e.g., BA, AB, BS)  Occupational History  . Occupation: Training and development officer: [employer]  . Financial resource strain: Not on file  . Food insecurity:    Worry: Not on file    Inability: Not on file  . Transportation needs:    Medical: Not on file    Non-medical: Not on file  Tobacco Use  . Smoking status: Never Smoker  . Smokeless tobacco: Never Used  Substance and Sexual Activity  . Alcohol use: Yes    Alcohol/week: 0.0 standard drinks    Comment: 2-3 x/year  . Drug use: No  . Sexual activity: Yes    Partners: Male    Birth control/protection: Surgical    Comment: BTSP 2005/TAH/BSO  Lifestyle  . Physical activity:    Days per week: Not on file    Minutes per session: Not on file  . Stress: Not on file  Relationships  . Social connections:    Talks on phone: Not on file    Gets together: Not on file    Attends religious service: Not on file    Active member of club or organization: Not on file    Attends meetings of clubs or organizations: Not on file    Relationship status: Not on file  . Intimate partner violence:    Fear of current or ex partner: Not on file    Emotionally  abused: Not on file    Physically abused: Not on file    Forced sexual activity: Not on file  Other Topics Concern  . Not on file  Social History Narrative   Divorced mother of 2.  Lives with her son.  Works in Science writer work.  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     Review of Systems  Constitutional: Negative.   HENT: Negative.   Eyes: Negative.   Respiratory: Negative.   Cardiovascular: Negative.   Gastrointestinal: Negative.   Endocrine: Negative.   Genitourinary:  Negative.   Musculoskeletal: Negative.   Skin: Negative.   Allergic/Immunologic: Negative.   Neurological: Negative.   Hematological: Negative.   Psychiatric/Behavioral: Negative.   All other systems reviewed and are negative.      Objective:    Vitals:   03/04/19 1549  BP: 134/70  Pulse: 82  Resp: 18  Temp: 98.5 F (36.9 C)  SpO2: 99%  Weight: 192 lb 12.8 oz (87.5 kg)  Height: 5\' 5"  (1.651 m)      Physical Exam Vitals signs and nursing note reviewed.  Constitutional:      General: She is not in acute distress.    Appearance: Normal appearance. She is well-developed. She is not ill-appearing, toxic-appearing or diaphoretic.  HENT:     Head: Normocephalic and atraumatic.     Right Ear: Hearing, tympanic membrane, ear canal and external ear normal. There is no impacted cerumen.     Left Ear: Hearing, tympanic membrane, ear canal and external ear normal. There is no impacted cerumen.     Nose: Mucosal edema present. No nasal deformity, nasal tenderness, congestion or rhinorrhea.     Right Turbinates: Enlarged and swollen.     Left Turbinates: Enlarged and swollen.     Right Sinus: Maxillary sinus tenderness and frontal sinus tenderness present.     Left Sinus: Maxillary sinus tenderness and frontal sinus tenderness present.     Comments: Mildly injected diffusely edematous nasal mucosa, no epistaxis Nares patent b/l Mild sinus ttp     Mouth/Throat:     Mouth: Mucous membranes are moist. Mucous membranes  are not pale.     Pharynx: Oropharynx is clear. Uvula midline. No oropharyngeal exudate, posterior oropharyngeal erythema or uvula swelling.     Tonsils: No tonsillar abscesses.  Eyes:     General:        Right eye: No discharge.        Left eye: No discharge.     Conjunctiva/sclera: Conjunctivae normal.     Pupils: Pupils are equal, round, and reactive to light.  Neck:     Musculoskeletal: Normal range of motion and neck supple.     Trachea: No tracheal deviation.  Cardiovascular:     Rate and Rhythm: Normal rate and regular rhythm.     Pulses: Normal pulses.     Heart sounds: Normal heart sounds. No murmur. No friction rub. No gallop.   Pulmonary:     Effort: Pulmonary effort is normal. No respiratory distress.     Breath sounds: No stridor. No wheezing, rhonchi or rales.  Abdominal:     General: Bowel sounds are normal. There is no distension.     Palpations: Abdomen is soft.  Musculoskeletal: Normal range of motion.  Lymphadenopathy:     Cervical: No cervical adenopathy.  Skin:    General: Skin is warm and dry.     Coloration: Skin is not pale.     Findings: No rash.  Neurological:     Mental Status: She is alert.     Motor: No abnormal muscle tone.     Coordination: Coordination normal.  Psychiatric:        Behavior: Behavior normal.           Assessment & Plan:      ICD-10-CM   1. Acute recurrent pansinusitis J01.41 amoxicillin-clavulanate (AUGMENTIN) 875-125 MG tablet  2. Allergic rhinitis, unspecified seasonality, unspecified trigger J30.9   3. Exacerbation of asthma, unspecified asthma severity, unspecified whether persistent J45.901     1&2 -  out of xyzal, worsening sx for several days, nasal mucosa appears chronically enlarged and mildly injected, no purulent discharge noted, nares patent b/l, mild sinus ttp.  Pt afebrile, well appearing.  Abx sent in, but she was instructed to hold and add back her maintenance meds, OTC supportive measures, humidifier, and  if not improving or if continuing to worsen with sinus pain/pressure she can take augmentin.  She has one brief dizzy episode - can try meclizine trial  She was also exposed to mold at her workplace - she wanted mold testing here today.  She has increased use of SABA, suspect mild asthma exacerbation, she has steroid prescription at home, will start if wheeze worsens or she uses inhaler more frequently - here on exam, lungs CTA.    I have looked up the available allergen profile, mold - labs available, however I cannot see what tests are in that panel, and have tried to research indication and use of testing but I cannot find a lot of information and have explained this to her so I will not test her today she was encouraged to follow-up with allergy specialist that she went to last year, and I told her I would check with her PCP to see if it is something her PCP does or utilizes.     Delsa Grana, PA-C 03/04/19 4:05 PM

## 2019-03-04 NOTE — Patient Instructions (Signed)
If you continue to have wheeze and use inhaler more than 1-2 x a day, or if it wakes you up at night, or using inhaler does not improve your symptoms, then start the steroids.  Get back on all allergy meds, try additional nasal spray, see if you feel better first before using the antibiotics.    Use meclizine as needed for dizziness/vertigo episodes.

## 2019-03-09 ENCOUNTER — Other Ambulatory Visit: Payer: Self-pay

## 2019-03-09 ENCOUNTER — Emergency Department (HOSPITAL_BASED_OUTPATIENT_CLINIC_OR_DEPARTMENT_OTHER): Payer: 59

## 2019-03-09 ENCOUNTER — Encounter (HOSPITAL_BASED_OUTPATIENT_CLINIC_OR_DEPARTMENT_OTHER): Payer: Self-pay | Admitting: *Deleted

## 2019-03-09 ENCOUNTER — Ambulatory Visit: Payer: 59 | Admitting: Obstetrics and Gynecology

## 2019-03-09 ENCOUNTER — Emergency Department (HOSPITAL_BASED_OUTPATIENT_CLINIC_OR_DEPARTMENT_OTHER)
Admission: EM | Admit: 2019-03-09 | Discharge: 2019-03-09 | Disposition: A | Discharge status: Home / Self Care | Payer: 59 | Attending: Emergency Medicine | Admitting: Emergency Medicine

## 2019-03-09 DIAGNOSIS — J4521 Mild intermittent asthma with (acute) exacerbation: Secondary | ICD-10-CM | POA: Diagnosis not present

## 2019-03-09 DIAGNOSIS — Z79899 Other long term (current) drug therapy: Secondary | ICD-10-CM | POA: Insufficient documentation

## 2019-03-09 DIAGNOSIS — I10 Essential (primary) hypertension: Secondary | ICD-10-CM | POA: Diagnosis not present

## 2019-03-09 DIAGNOSIS — R05 Cough: Secondary | ICD-10-CM | POA: Diagnosis not present

## 2019-03-09 DIAGNOSIS — J4520 Mild intermittent asthma, uncomplicated: Secondary | ICD-10-CM | POA: Diagnosis not present

## 2019-03-09 DIAGNOSIS — R0602 Shortness of breath: Secondary | ICD-10-CM | POA: Diagnosis present

## 2019-03-09 MED ORDER — PREDNISONE 50 MG PO TABS
60.0000 mg | ORAL_TABLET | Freq: Once | ORAL | Status: AC
  Administered 2019-03-09: 60 mg via ORAL
  Filled 2019-03-09: qty 1

## 2019-03-09 MED ORDER — PREDNISONE 20 MG PO TABS: ORAL_TABLET | ORAL | 0 refills | Status: DC

## 2019-03-09 NOTE — Discharge Instructions (Signed)
Take prednisone as prescribed.   Use your albuterol every 4 hrs as needed   See your doctor  Return to ER if you have worse shortness of breath, wheezing, cough, fever

## 2019-03-09 NOTE — ED Provider Notes (Signed)
[facility] EMERGENCY DEPARTMENT Provider Note   CSN: [id number] Arrival date & time: 03/09/19  [place]    History   Chief Complaint Chief Complaint  Patient presents with  . Shortness of Breath    HPI Mary Robles is a 43 y.o. female hx of HTN, HL, here presenting with cough, shortness of breath.  Patient states that she has history of asthma and she did open up her windows several days ago and a lot of pollen came in.  Since then, patient has been having nonproductive cough and subjective shortness of breath and wheezing.  Patient has used her albuterol several times with minimal relief.  Patient denies any fevers or productive cough.  Patient denies any recent travel or sick contacts.  Patient states that she has asthma exacerbation when there is change in season.  She used her albuterol inhaler about 2 hours prior to arrival.     The history is provided by the patient.    Past Medical History:  Diagnosis Date  . Angio-edema   . Anxiety   . Asthma    no symptoms in "years", no inhaler  . Depression   . Herpes simplex   . Hyperlipidemia due to dietary fat intake   . Hyperlysinemia ([place])   . Hypertension   . Sickle cell trait ([place])   . Vitamin D deficiency     Patient Active Problem List   Diagnosis Date Noted  . Chest pain with moderate risk for cardiac etiology 09/17/2018  . DOE (dyspnea on exertion) 09/17/2018  . Obesity (BMI 30.0-34.9) 07/02/2017  . MDD (major depressive disorder) 07/16/2016  . Asthma, mild 04/11/2015  . GAD (generalized anxiety disorder) 02/08/2015  . Hyperlipidemia 07/09/2014  . Insomnia 07/09/2014  . Lipoma of abdominal wall s/p excision 05/04/2014 05/19/2014  . Status post total abdominal hysterectomy 05/04/2014  . Hypertension   . Vitamin D deficiency     Past Surgical History:  Procedure Laterality Date  . ABDOMINAL HYSTERECTOMY N/A 05/04/2014   Procedure: HYSTERECTOMY ABDOMINAL with BILATERAL SALPINGECTOMY ;  Surgeon: Everardo All  Amundson de Berton Lan, MD;  Location: [facility];  Service: Gynecology;  Laterality: N/A;  . BREAST BIOPSY Right Jan. 2016   benign fibroadenoma  . FOOT SURGERY    . LAPAROTOMY N/A 05/04/2014   Procedure: REMOVAL OF ABD WALL MASS ;  Surgeon: Adin Hector, MD;  Location: [facility];  Service: General;  Laterality: N/A;  . LIPOMA EXCISION N/A 05/04/2014   Procedure: EXCISION LIPOMA of abdominal wall;  Surgeon: Jamey Reas de Berton Lan, MD;  Location: [facility];  Service: Gynecology;  Laterality: N/A;  . NASAL SINUS SURGERY  2019  . PELVIC LAPAROSCOPY Right 2018   oophorectomy  . TUBAL LIGATION       OB History    Gravida  4   Para  2   Term      Preterm      AB  1   Living  2     SAB      TAB  1   Ectopic      Multiple      Live Births               Home Medications    Prior to Admission medications   Medication Sig Start Date End Date Taking? Authorizing Provider  acetaminophen (TYLENOL) 500 MG tablet Take 1,000 mg by mouth every 6 (six) hours as needed (for pain or headaches).  [provider]  albuterol (PROVENTIL HFA;VENTOLIN HFA) 108 (90 Base) MCG/ACT inhaler Inhale 2 puffs into the lungs every 6 (six) hours as needed for wheezing or shortness of breath. 01/09/19   [place], Modena Nunnery, MD  amoxicillin-clavulanate (AUGMENTIN) 875-125 MG tablet Take 1 tablet by mouth 2 (two) times daily for 7 days. 03/04/19 03/11/19  Delsa Grana, PA-C  cloNIDine (CATAPRES) 0.1 MG tablet TAKE 1 TABLET BY MOUTH TWICE DAILY 10/20/18   [place], Modena Nunnery, MD  EPINEPHrine (EPIPEN 2-PAK) 0.3 mg/0.3 mL IJ SOAJ injection Inject 0.3 mg into the muscle once as needed (for an anaphylactic reaction).    [provider]  fluconazole (DIFLUCAN) 150 MG tablet Take 1 tablet (150 mg total) by mouth every 3 (three) days as needed (for vaginal itching/yeast infection sx). 03/04/19   Delsa Grana, PA-C  fluticasone (FLONASE) 50 MCG/ACT nasal spray Place 2 sprays into both  nostrils daily as needed for allergies or rhinitis. 01/09/19   [place], Modena Nunnery, MD  guaiFENesin-codeine 100-10 MG/5ML syrup Take 5 mLs by mouth every 6 (six) hours as needed. Patient not taking: Reported on 03/04/2019 01/09/19   Alycia Rossetti, MD  hydrochlorothiazide (HYDRODIURIL) 25 MG tablet TAKE 1 TABLET BY MOUTH ONCE DAILY 09/08/18   Alycia Rossetti, MD  ipratropium (ATROVENT) 0.03 % nasal spray Place 2 sprays into both nostrils every 12 (twelve) hours. 03/04/19   Delsa Grana, PA-C  levocetirizine (XYZAL) 5 MG tablet Take 1 tablet (5 mg total) by mouth every evening. 03/04/19   Delsa Grana, PA-C  meclizine (ANTIVERT) 12.5 MG tablet Take 1-2 tablets (12.5-25 mg total) by mouth 3 (three) times daily as needed for dizziness. 03/04/19   Delsa Grana, PA-C  montelukast (SINGULAIR) 10 MG tablet Take 1 tablet (10 mg total) by mouth at bedtime. 01/09/19   Alycia Rossetti, MD  predniSONE (DELTASONE) 20 MG tablet Take 2 tablets (40 mg total) by mouth daily with breakfast. Patient not taking: Reported on 03/04/2019 01/09/19   Alycia Rossetti, MD    Family History Family History  Problem Relation Age of Onset  . Diabetes Mother   . Thyroid disease Mother   . Stroke Father   . Mental retardation Maternal Aunt   . Diabetes Maternal Grandmother     Social History Social History   Tobacco Use  . Smoking status: Never Smoker  . Smokeless tobacco: Never Used  Substance Use Topics  . Alcohol use: Yes    Alcohol/week: 0.0 standard drinks    Comment: 2-3 x/year  . Drug use: No     Allergies   Vicodin [hydrocodone-acetaminophen]; Benazepril; Betadine [povidone iodine]; Crab [shellfish allergy]; and Other   Review of Systems Review of Systems  Respiratory: Positive for shortness of breath.   All other systems reviewed and are negative.    Physical Exam Updated Vital Signs BP (!) 157/102   Pulse 95   Temp 98.5 F (36.9 C)   Ht 5\' 5"  (1.651 m)   Wt 87 kg   LMP 04/17/2014  Comment: hysterectomy w/ single oophorectomy//a.c.  SpO2 100%   BMI 31.92 kg/m   Physical Exam Vitals signs and nursing note reviewed.  Constitutional:      Appearance: She is well-developed.  HENT:     Head: Normocephalic.     Mouth/Throat:     Mouth: Mucous membranes are moist.  Eyes:     Pupils: Pupils are equal, round, and reactive to light.  Neck:     Musculoskeletal: Normal range of motion  and neck supple.  Cardiovascular:     Rate and Rhythm: Normal rate and regular rhythm.  Pulmonary:     Comments: Slightly tachypneic, no obvious wheezing  Musculoskeletal: Normal range of motion.  Skin:    General: Skin is warm.     Capillary Refill: Capillary refill takes less than 2 seconds.  Neurological:     General: No focal deficit present.     Mental Status: She is alert and oriented to person, place, and time.  Psychiatric:        Mood and Affect: Mood normal.        Behavior: Behavior normal.      ED Treatments / Results  Labs (all labs ordered are listed, but only abnormal results are displayed) Labs Reviewed - No data to display  EKG None  Radiology Dg Chest 2 View  Result Date: 03/09/2019 CLINICAL DATA:  Cough with asthma EXAM: CHEST - 2 VIEW COMPARISON:  September 01, 2018 FINDINGS: There is no appreciable edema or consolidation. The heart size and pulmonary vascularity are normal. No adenopathy. No bone lesions. IMPRESSION: No edema or consolidation. Electronically Signed   By: Lowella Grip III M.D.   On: 03/09/2019 19:32    Procedures Procedures (including critical care time)  Medications Ordered in ED Medications  predniSONE (DELTASONE) tablet 60 mg (60 mg Oral Given 03/09/19 1919)     Initial Impression / Assessment and Plan / ED Course  I have reviewed the triage vital signs and the nursing notes.  Pertinent labs & imaging results that were available during my care of the patient were reviewed by me and considered in my medical decision making  (see chart for details).       DEMMI SINDT is a 43 y.o. female here with SOB, cough. Hx of asthma and has seasonal allergies. Likely asthma exacerbation. Afebrile, no sick contacts. Will get CXR and give prednisone. Patient used albuterol prior to arrival.   7:53 PM CXR clear. Has albuterol at home. Will dc home with prednisone taper.    Final Clinical Impressions(s) / ED Diagnoses   Final diagnoses:  None    ED Discharge Orders    None       Drenda Freeze, MD 03/09/19 [phone]

## 2019-03-09 NOTE — ED Triage Notes (Signed)
Pt SOB x 2 days HX asthma

## 2019-04-03 ENCOUNTER — Ambulatory Visit (INDEPENDENT_AMBULATORY_CARE_PROVIDER_SITE_OTHER): Payer: 59 | Admitting: Family Medicine

## 2019-04-03 ENCOUNTER — Encounter: Payer: Self-pay | Admitting: Family Medicine

## 2019-04-03 ENCOUNTER — Other Ambulatory Visit: Payer: Self-pay

## 2019-04-03 VITALS — BP 124/68 | HR 102 | Temp 98.3°F | Resp 16 | Ht 65.0 in | Wt 181.0 lb

## 2019-04-03 DIAGNOSIS — E860 Dehydration: Secondary | ICD-10-CM

## 2019-04-03 DIAGNOSIS — K529 Noninfective gastroenteritis and colitis, unspecified: Secondary | ICD-10-CM | POA: Diagnosis not present

## 2019-04-03 DIAGNOSIS — N898 Other specified noninflammatory disorders of vagina: Secondary | ICD-10-CM

## 2019-04-03 DIAGNOSIS — R634 Abnormal weight loss: Secondary | ICD-10-CM

## 2019-04-03 DIAGNOSIS — R1030 Lower abdominal pain, unspecified: Secondary | ICD-10-CM | POA: Diagnosis not present

## 2019-04-03 DIAGNOSIS — N3001 Acute cystitis with hematuria: Secondary | ICD-10-CM

## 2019-04-03 LAB — URINALYSIS, ROUTINE W REFLEX MICROSCOPIC
Bilirubin Urine: NEGATIVE
Glucose, UA: NEGATIVE
Hyaline Cast: NONE SEEN /LPF
Leukocytes,Ua: NEGATIVE
Nitrite: NEGATIVE
Protein, ur: NEGATIVE
Specific Gravity, Urine: 1.018 (ref 1.001–1.03)
WBC, UA: NONE SEEN /HPF (ref 0–5)
pH: 6.5 (ref 5.0–8.0)

## 2019-04-03 LAB — WET PREP FOR TRICH, YEAST, CLUE

## 2019-04-03 LAB — MICROSCOPIC MESSAGE

## 2019-04-03 MED ORDER — FLUCONAZOLE 150 MG PO TABS: ORAL_TABLET | ORAL | 0 refills | Status: DC

## 2019-04-03 MED ORDER — CEPHALEXIN 500 MG PO CAPS: 500.0000 mg | ORAL_CAPSULE | Freq: Two times a day (BID) | ORAL | 0 refills | Status: DC

## 2019-04-03 MED ORDER — ONDANSETRON 4 MG PO TBDP: 4.0000 mg | ORAL_TABLET | Freq: Three times a day (TID) | ORAL | 0 refills | Status: DC | PRN

## 2019-04-03 NOTE — Patient Instructions (Addendum)
Take antibiotics  Push fluids Diflucan sent to pharmacy F/U as needed

## 2019-04-03 NOTE — Progress Notes (Signed)
   Subjective:    Patient ID: Mary Robles, female    DOB: 1976/02/15, 43 y.o.   MRN: [id number]  Patient presents for Abd Pain (x4 days- thought it was food poisioning- took mag citrate to clear system) and Vaginal Irritation (itching, no discharge noted)  Pt here with multiple concerns. Has had vaginal itching for past few days, no discharge, but itching. No vaginal bleeding. Has had some mild burning with urination. Monday ate at [facility], after a few bites felt food didn't settle right and started having abdominal pain and cramping. Another person ate same food, had some GI upset. Shehas been very nauseous but no vomiting, unable to have BM, so she took Mag citrate Wed, which gave her a large BM but still has nausea as well No fever. She is still eating small amounts and bland foods  Weight down 10lbs in 1 month, unintentional   Review Of Systems:  GEN- denies fatigue, fever, +weight loss,weakness, recent illness HEENT- denies eye drainage, change in vision, nasal discharge, CVS- denies chest pain, palpitations RESP- denies SOB, cough, wheeze ABD- denies N/V, change in stools, +abd pain GU- + dysuria,  Denies hematuria, dribbling, incontinence MSK- denies joint pain, muscle aches, injury Neuro- denies headache, dizziness, syncope, seizure activity       Objective:    BP 124/68   Pulse (!) 102   Temp 98.3 F (36.8 C) (Oral)   Resp 16   Ht 5\' 5"  (1.651 m)   Wt 181 lb (82.1 kg)   LMP 04/17/2014 Comment: hysterectomy w/ single oophorectomy//a.c.  SpO2 99%   BMI 30.12 kg/m  GEN- NAD, alert and oriented x3 HEENT- PERRL, EOMI, non injected sclera, pink conjunctiva, MMM, oropharynx clear Neck- Supple, no thyromegaly CVS- RRR HR 90, no murmur RESP-CTAB ABD-NABS,soft,mild TTP suprapubic region, ND, no CVA tenderness GU- normal external genitalia, vaginal mucosa pink and moist,s/[p hysterectomy no ovarian masses, uterus normal size+ white discharge in vault    EXT- No edema Pulses-  Radial 2+        Assessment & Plan:      Problem List Items Addressed This Visit    None    Visit Diagnoses    Acute cystitis with hematuria    -  Primary   Treat with keflex, labs done   Relevant Orders   Urinalysis, Routine w reflex microscopic (Completed)   CBC with Differential/Platelet (Completed)   Comprehensive metabolic panel (Completed)   Lipase (Completed)   Vaginal discharge       Nothing found on wet prep, but gets yeast infection after antibiotics so Keflex given   Relevant Orders   WET PREP FOR [place], YEAST, CLUE (Completed)   Gastroenteritis       zofran for nausea, dehyration with ketones on UA, push fluids   Weight loss, unintentional       recent ilness, check TSH, labs as well, will recheck weight next visit see if she improves    Relevant Orders   TSH (Completed)   Dehydration       Mild dehydration      Note: This dictation was prepared with Dragon dictation along with smaller phrase technology. Any transcriptional errors that result from this process are unintentional.

## 2019-04-04 ENCOUNTER — Other Ambulatory Visit: Payer: Self-pay | Admitting: Family Medicine

## 2019-04-04 LAB — COMPREHENSIVE METABOLIC PANEL
AG Ratio: 1.2 (calc) (ref 1.0–2.5)
ALT: 18 U/L (ref 6–29)
AST: 17 U/L (ref 10–30)
Albumin: 3.9 g/dL (ref 3.6–5.1)
Alkaline phosphatase (APISO): 41 U/L (ref 31–125)
BUN: 9 mg/dL (ref 7–25)
CO2: 27 mmol/L (ref 20–32)
Calcium: 9.6 mg/dL (ref 8.6–10.2)
Chloride: 101 mmol/L (ref 98–110)
Creat: 0.99 mg/dL (ref 0.50–1.10)
Globulin: 3.2 g/dL (calc) (ref 1.9–3.7)
Glucose, Bld: 93 mg/dL (ref 65–99)
Potassium: 3 mmol/L — ABNORMAL LOW (ref 3.5–5.3)
Sodium: 140 mmol/L (ref 135–146)
Total Bilirubin: 0.6 mg/dL (ref 0.2–1.2)
Total Protein: 7.1 g/dL (ref 6.1–8.1)

## 2019-04-04 LAB — TSH: TSH: 1.24 mIU/L

## 2019-04-04 LAB — CBC WITH DIFFERENTIAL/PLATELET
Absolute Monocytes: 655 cells/uL (ref 200–950)
Basophils Absolute: 39 cells/uL (ref 0–200)
Basophils Relative: 0.5 %
Eosinophils Absolute: 39 cells/uL (ref 15–500)
Eosinophils Relative: 0.5 %
HCT: 41.1 % (ref 35.0–45.0)
Hemoglobin: 14 g/dL (ref 11.7–15.5)
Lymphs Abs: 2730 cells/uL (ref 850–3900)
MCH: 27.9 pg (ref 27.0–33.0)
MCHC: 34.1 g/dL (ref 32.0–36.0)
MCV: 82 fL (ref 80.0–100.0)
MPV: 10.7 fL (ref 7.5–12.5)
Monocytes Relative: 8.4 %
Neutro Abs: 4337 cells/uL (ref 1500–7800)
Neutrophils Relative %: 55.6 %
Platelets: 374 10*3/uL (ref 140–400)
RBC: 5.01 10*6/uL (ref 3.80–5.10)
RDW: 14.9 % (ref 11.0–15.0)
Total Lymphocyte: 35 %
WBC: 7.8 10*3/uL (ref 3.8–10.8)

## 2019-04-04 LAB — LIPASE: Lipase: 24 U/L (ref 7–60)

## 2019-04-05 ENCOUNTER — Encounter: Payer: Self-pay | Admitting: Family Medicine

## 2019-04-05 MED ORDER — POTASSIUM CHLORIDE CRYS ER 20 MEQ PO TBCR: EXTENDED_RELEASE_TABLET | ORAL | 0 refills | Status: DC

## 2019-04-06 ENCOUNTER — Other Ambulatory Visit: Payer: Self-pay | Admitting: *Deleted

## 2019-04-06 MED ORDER — CLONIDINE HCL 0.1 MG PO TABS: 0.1000 mg | ORAL_TABLET | Freq: Two times a day (BID) | ORAL | 0 refills | Status: DC

## 2019-04-10 ENCOUNTER — Encounter: Payer: Self-pay | Admitting: Family Medicine

## 2019-04-10 MED ORDER — PANTOPRAZOLE SODIUM 40 MG PO TBEC: 40.0000 mg | DELAYED_RELEASE_TABLET | Freq: Every day | ORAL | 3 refills | Status: DC

## 2019-04-16 ENCOUNTER — Encounter: Payer: Self-pay | Admitting: Family Medicine

## 2019-04-16 DIAGNOSIS — R110 Nausea: Secondary | ICD-10-CM

## 2019-04-16 DIAGNOSIS — K449 Diaphragmatic hernia without obstruction or gangrene: Secondary | ICD-10-CM

## 2019-04-19 ENCOUNTER — Other Ambulatory Visit: Payer: Self-pay | Admitting: Family Medicine

## 2019-04-21 ENCOUNTER — Telehealth: Payer: Self-pay | Admitting: Obstetrics and Gynecology

## 2019-04-21 ENCOUNTER — Ambulatory Visit: Payer: 59 | Admitting: Obstetrics and Gynecology

## 2019-04-21 ENCOUNTER — Other Ambulatory Visit: Payer: Self-pay

## 2019-04-21 ENCOUNTER — Encounter: Payer: Self-pay | Admitting: Obstetrics and Gynecology

## 2019-04-21 VITALS — BP 104/64 | HR 68 | Temp 98.1°F | Resp 16 | Wt 181.0 lb

## 2019-04-21 DIAGNOSIS — R1084 Generalized abdominal pain: Secondary | ICD-10-CM | POA: Diagnosis not present

## 2019-04-21 DIAGNOSIS — N760 Acute vaginitis: Secondary | ICD-10-CM

## 2019-04-21 LAB — POCT URINALYSIS DIPSTICK
Bilirubin, UA: NEGATIVE
Blood, UA: NEGATIVE
Glucose, UA: NEGATIVE
Ketones, UA: NEGATIVE
Leukocytes, UA: NEGATIVE
Nitrite, UA: NEGATIVE
Protein, UA: NEGATIVE
Urobilinogen, UA: NEGATIVE E.U./dL — AB
pH, UA: 5 (ref 5.0–8.0)

## 2019-04-21 NOTE — Telephone Encounter (Signed)
Patient is having abdominal and lower back pain.

## 2019-04-21 NOTE — Progress Notes (Signed)
GYNECOLOGY  VISIT   HPI: 43 y.o.   Divorced Black or [place] American Not Hispanic or Latino  female   [phone] with Patient's last menstrual period was 04/17/2014.   here for vaginal discharge & burning. The burning is intermittent, going on for the last few weeks. She c/o a thick yellow vaginal d/c that started yesterday. No itching. No odor. Sexually active, same partner x one year. No STD concerns.  She has a h/o a hysterectomy and RSO.    She was treated for a UTI a few weeks ago and was given something for yeast secondary to being on antibiotic. Marland Kitchen  She lost 10 lbs last month, was having stomach issues. She has an appointment with GI tomorrow.  No recent hot flashes or night sweats. No dyspareunia.    GYNECOLOGIC HISTORY: Patient's last menstrual period was 04/17/2014. Contraception: hysterectomy Menopausal hormone therapy: none        OB History    Gravida  4   Para  2   Term      Preterm      AB  1   Living  2     SAB      TAB  1   Ectopic      Multiple      Live Births                 Patient Active Problem List   Diagnosis Date Noted  . Chest pain with moderate risk for cardiac etiology 09/17/2018  . DOE (dyspnea on exertion) 09/17/2018  . Obesity (BMI 30.0-34.9) 07/02/2017  . MDD (major depressive disorder) 07/16/2016  . Asthma, mild 04/11/2015  . GAD (generalized anxiety disorder) 02/08/2015  . Hyperlipidemia 07/09/2014  . Insomnia 07/09/2014  . Lipoma of abdominal wall s/p excision 05/04/2014 05/19/2014  . Status post total abdominal hysterectomy 05/04/2014  . Hypertension   . Vitamin D deficiency     Past Medical History:  Diagnosis Date  . Angio-edema   . Anxiety   . Asthma    no symptoms in "years", no inhaler  . Depression   . Herpes simplex   . Hiatal hernia   . Hyperlipidemia due to dietary fat intake   . Hyperlysinemia ([place])   . Hypertension   . Sickle cell trait ([place])   . Vitamin D deficiency     Past Surgical History:   Procedure Laterality Date  . ABDOMINAL HYSTERECTOMY N/A 05/04/2014   Procedure: HYSTERECTOMY ABDOMINAL with BILATERAL SALPINGECTOMY ;  Surgeon: Everardo All Amundson de Berton Lan, MD;  Location: [facility];  Service: Gynecology;  Laterality: N/A;  . BREAST BIOPSY Right Jan. 2016   benign fibroadenoma  . FOOT SURGERY    . LAPAROTOMY N/A 05/04/2014   Procedure: REMOVAL OF ABD WALL MASS ;  Surgeon: Adin Hector, MD;  Location: [facility];  Service: General;  Laterality: N/A;  . LIPOMA EXCISION N/A 05/04/2014   Procedure: EXCISION LIPOMA of abdominal wall;  Surgeon: Jamey Reas de Berton Lan, MD;  Location: [facility];  Service: Gynecology;  Laterality: N/A;  . NASAL SINUS SURGERY  2019  . PELVIC LAPAROSCOPY Right 2018   oophorectomy  . TUBAL LIGATION      Current Outpatient Medications  Medication Sig Dispense Refill  . acetaminophen (TYLENOL) 500 MG tablet Take 1,000 mg by mouth every 6 (six) hours as needed (for pain or headaches).    Marland Kitchen albuterol (PROVENTIL HFA;VENTOLIN HFA) 108 (90 Base) MCG/ACT inhaler Inhale 2 puffs  into the lungs every 6 (six) hours as needed for wheezing or shortness of breath. 1 Inhaler 2  . cloNIDine (CATAPRES) 0.1 MG tablet Take 1 tablet (0.1 mg total) by mouth 2 (two) times daily. 180 tablet 0  . fluticasone (FLONASE) 50 MCG/ACT nasal spray Place 2 sprays into both nostrils daily as needed for allergies or rhinitis. 16 g 2  . hydrochlorothiazide (HYDRODIURIL) 25 MG tablet Take 1 tablet by mouth once daily 30 tablet 0  . levocetirizine (XYZAL) 5 MG tablet Take 1 tablet (5 mg total) by mouth every evening. 90 tablet 1  . montelukast (SINGULAIR) 10 MG tablet Take 1 tablet (10 mg total) by mouth at bedtime. 30 tablet 3  . ondansetron (ZOFRAN ODT) 4 MG disintegrating tablet Take 1 tablet (4 mg total) by mouth every 8 (eight) hours as needed for nausea or vomiting. 20 tablet 0  . pantoprazole (PROTONIX) 40 MG tablet Take 1 tablet (40 mg total) by mouth daily. 30 tablet  3  . EPINEPHrine (EPIPEN 2-PAK) 0.3 mg/0.3 mL IJ SOAJ injection Inject 0.3 mg into the muscle once as needed (for an anaphylactic reaction).     No current facility-administered medications for this visit.      ALLERGIES: Vicodin [hydrocodone-acetaminophen]; Benazepril; Betadine [povidone iodine]; Crab [shellfish allergy]; and Other  Family History  Problem Relation Age of Onset  . Diabetes Mother   . Thyroid disease Mother   . Stroke Father   . Mental retardation Maternal Aunt   . Diabetes Maternal Grandmother     Social History   Socioeconomic History  . Marital status: Divorced    Spouse name: Not on file  . Number of children: 2  . Years of education: Not on file  . Highest education level: Bachelor's degree (e.g., BA, AB, BS)  Occupational History  . Occupation: Training and development officer: [employer]  . Financial resource strain: Not on file  . Food insecurity:    Worry: Not on file    Inability: Not on file  . Transportation needs:    Medical: Not on file    Non-medical: Not on file  Tobacco Use  . Smoking status: Never Smoker  . Smokeless tobacco: Never Used  Substance and Sexual Activity  . Alcohol use: Not Currently    Alcohol/week: 0.0 standard drinks  . Drug use: No  . Sexual activity: Yes    Partners: Male    Birth control/protection: Surgical    Comment: BTSP 2005/TAH/BSO  Lifestyle  . Physical activity:    Days per week: Not on file    Minutes per session: Not on file  . Stress: Not on file  Relationships  . Social connections:    Talks on phone: Not on file    Gets together: Not on file    Attends religious service: Not on file    Active member of club or organization: Not on file    Attends meetings of clubs or organizations: Not on file    Relationship status: Not on file  . Intimate partner violence:    Fear of current or ex partner: Not on file    Emotionally abused: Not on file    Physically abused: Not on file     Forced sexual activity: Not on file  Other Topics Concern  . Not on file  Social History Narrative   Divorced mother of 2.  Lives with her son.  Works in Science writer work.  Does not do  routine exercise.    Review of Systems  Constitutional: Negative.   HENT: Negative.   Eyes: Negative.   Respiratory: Negative.   Cardiovascular: Negative.   Gastrointestinal: Negative.   Genitourinary: Negative.   Musculoskeletal: Negative.   Skin:       Vaginal discharge, burning, pelvic pain, lower back pain  Neurological: Negative.   Endo/Heme/Allergies: Negative.   Psychiatric/Behavioral: Negative.     PHYSICAL EXAMINATION:    BP 104/64   Pulse 68   Temp 98.1 F (36.7 C) (Oral)   Resp 16   Wt 181 lb (82.1 kg)   LMP 04/17/2014 Comment: hysterectomy w/ single oophorectomy//a.c.  BMI 30.12 kg/m     General appearance: alert, cooperative and appears stated age  Pelvic: External genitalia:  no lesions              Urethra:  normal appearing urethra with no masses, tenderness or lesions              Bartholins and Skenes: normal                 Vagina: normal appearing vagina with a slight increase in thick, white vaginal d/c              Cervix: absent               Chaperone was present for exam.  Wet prep: no clue, no trich, few wbc KOH: no yeast PH: 4.5    poct urine-neg  ASSESSMENT Vaginitis, no abnormalities noted on vaginal slide. Main symptoms is burning. No vasomotor symptoms Abdominal pain, recent UTI, GI symptoms. Urine dip negative    PLAN Affirm sent Try vaginal moisturizer Further treatment depending on results She is f/u with GI for abdominal pain   An After Visit Summary was printed and given to the patient.  CC: Dr Quincy Simmonds

## 2019-04-21 NOTE — Telephone Encounter (Addendum)
Spoke with patient.  1. Reports abdominal pain 8/10, lower back pain, irregular BMs, bloating and weight loss. Symptoms have been present since 03/30/19, seen by PCP on 04/03/19. Patient states she was tx for possible UTI and given Rx for yeast," GI symptoms possibly related to something I ate". Patient states referral to GI has been placed, is awaiting return call from PCP. Recommended patient f/u with PCP for referral and further evaluation of abdominal pain.  2. Requesting OV for white vaginal d/c and vaginal burning. Denies urinary symptoms, vaginal odor, bleeding, fever/chills, N/V. Covid 19 screening completed, negative. OV scheduled for today with Dr. Talbert Nan.   Routing to provider for final review. Patient is agreeable to disposition. Will close encounter.

## 2019-04-21 NOTE — Addendum Note (Signed)
Addended by: Dorothy Spark on: 04/21/2019 03:24 PM   Modules accepted: Orders

## 2019-04-22 ENCOUNTER — Encounter: Payer: Self-pay | Admitting: Gastroenterology

## 2019-04-22 ENCOUNTER — Other Ambulatory Visit: Payer: Self-pay

## 2019-04-22 ENCOUNTER — Ambulatory Visit (INDEPENDENT_AMBULATORY_CARE_PROVIDER_SITE_OTHER): Payer: 59 | Admitting: Gastroenterology

## 2019-04-22 ENCOUNTER — Telehealth: Payer: Self-pay | Admitting: *Deleted

## 2019-04-22 VITALS — BP 148/101 | Ht 65.0 in | Wt 181.0 lb

## 2019-04-22 DIAGNOSIS — R109 Unspecified abdominal pain: Secondary | ICD-10-CM | POA: Diagnosis not present

## 2019-04-22 DIAGNOSIS — R110 Nausea: Secondary | ICD-10-CM | POA: Diagnosis not present

## 2019-04-22 DIAGNOSIS — K219 Gastro-esophageal reflux disease without esophagitis: Secondary | ICD-10-CM

## 2019-04-22 LAB — VAGINITIS/VAGINOSIS, DNA PROBE
Candida Species: NEGATIVE
Gardnerella vaginalis: POSITIVE — AB
Trichomonas vaginosis: NEGATIVE

## 2019-04-22 MED ORDER — PANTOPRAZOLE SODIUM 40 MG PO TBEC: 40.0000 mg | DELAYED_RELEASE_TABLET | Freq: Two times a day (BID) | ORAL | 0 refills | Status: DC

## 2019-04-22 MED ORDER — METRONIDAZOLE 500 MG PO TABS: 500.0000 mg | ORAL_TABLET | Freq: Two times a day (BID) | ORAL | 0 refills | Status: DC

## 2019-04-22 NOTE — Telephone Encounter (Signed)
Patient is returning a call to Jill H. °

## 2019-04-22 NOTE — Telephone Encounter (Signed)
-----   Message from Salvadore Dom, MD sent at 04/22/2019  9:34 AM EDT ----- Please inform the patient that her vaginitis probe was + for BV and treat with flagyl (either oral or vaginal, her choice), no ETOH while on Flagyl.  Oral: Flagyl 500 mg BID x 7 days, or Vaginal: Metrogel, 1 applicator per vagina q day x 5 days.

## 2019-04-22 NOTE — Progress Notes (Signed)
TELEHEALTH VISIT  Referring Provider: Alycia Rossetti, MD Primary Care Physician:  Alycia Rossetti, MD   Tele-visit due to COVID-19 pandemic Patient requested visit virtually, consented to the virtual encounter via video enabled telemedicine application ([employer]) Contact made at: 15:00 04/22/19 Patient verified by name and date of birth Location of patient: Home Location provider: [facility] Names of persons participating: Me, patient, [name] CMA Time spent on telehealth visit: 28 minutes I discussed the limitations of evaluation and management by telemedicine. The patient expressed understanding and agreed to proceed.  Reason for Consultation:  GERD, chronic nausea, hiatal hernia   IMPRESSION:  Post-prandial abdominal pain with nausea and bloating GERD improved with PPI therapy Small to moderate sized hiatal hernia seen on CT coronary Unintentional 12 pound weight loss No known family history of colon cancer or polyps  Suspected symptomatic gallbladder disease. Differential also includes esophagitis, functional dyspepsia, peptic ulcer disease, H pylori gastritis, celiac, food allergies, bacterial overgrowth, and IBS. There is no sitophobia.   PLAN: Abdominal ultrasound If ultrasound is negative, will plan HIDA with CCK If the HIDA scan is negative, will schedule EGD Increase pantoprazole 40 mg twice daily  I consented the patient discussing the risks, benefits, and alternatives to endoscopic evaluation. In particular, we discussed the risks that include, but are not limited to, reaction to medication, cardiopulmonary compromise, bleeding requiring blood transfusion, aspiration resulting in pneumonia, perforation requiring surgery, lack of diagnosis, severe illness requiring hospitalization, and even death. We reviewed the risk of missed lesion including polyps or even cancer. The patient acknowledges these risks and asks that we proceed.   HPI: Mary Robles is a 43 y.o. female referred by Dr. Buelah Manis for further evaluation of chronic nausea and a hiatal hernia.  The history is obtained through the patient and review of her electronic health record. Recent diagnosis of bacterial vaginosis.   She reports nausea and abdominal pain that initially started after eating a meal at [employer] 03/30/19. Developed a "gnawing feeling."  Associated anorexia, borborygmous, and bloating with distension. Feels 4 months pregnant after eating.   Severe pain and nausea eating any fried or greasy foods. Develops as soon as she finishes eating. No associated cramping or ugency. No postprandial defecation. No change in pain with defecation.  Lost 12 pounds since March without trying.   Also has longstanding GERD. Nocturnal symptoms would wake her over the last 3-4 years.  Was treated with pantoprazole 40 mg daily. Helped with the reflux but not the other symptoms.  Normally has two BM daily, but, has recently been having one bowel movement every other day.   Cardiologist did a CT coronary scan 10/24/18  that showed a moderate sized hiatal hernia. She wonders if this is the cause.   Mom with colon polyps. Father's family history is unknown. No known family history of colon cancer or polyps. No family history of uterine/endometrial cancer, pancreatic cancer or gastric/stomach cancer.  Past Medical History:  Diagnosis Date  . Angio-edema   . Anxiety   . Asthma    no symptoms in "years", no inhaler  . Depression   . Herpes simplex   . Hiatal hernia   . Hyperlipidemia due to dietary fat intake   . Hyperlysinemia ([employer])   . Hypertension   . Sickle cell trait ([place])   . Vitamin D deficiency     Past Surgical History:  Procedure Laterality Date  . ABDOMINAL HYSTERECTOMY N/A 05/04/2014   Procedure: HYSTERECTOMY ABDOMINAL  with BILATERAL SALPINGECTOMY ;  Surgeon: Jamey Reas de Berton Lan, MD;  Location: [facility];  Service: Gynecology;  Laterality: N/A;  . BREAST  BIOPSY Right Jan. 2016   benign fibroadenoma  . FOOT SURGERY    . LAPAROTOMY N/A 05/04/2014   Procedure: REMOVAL OF ABD WALL MASS ;  Surgeon: Adin Hector, MD;  Location: [facility];  Service: General;  Laterality: N/A;  . LIPOMA EXCISION N/A 05/04/2014   Procedure: EXCISION LIPOMA of abdominal wall;  Surgeon: Jamey Reas de Berton Lan, MD;  Location: [facility];  Service: Gynecology;  Laterality: N/A;  . NASAL SINUS SURGERY  2019  . PELVIC LAPAROSCOPY Right 2018   oophorectomy  . TUBAL LIGATION      Current Outpatient Medications  Medication Sig Dispense Refill  . acetaminophen (TYLENOL) 500 MG tablet Take 1,000 mg by mouth every 6 (six) hours as needed (for pain or headaches).    Marland Kitchen albuterol (PROVENTIL HFA;VENTOLIN HFA) 108 (90 Base) MCG/ACT inhaler Inhale 2 puffs into the lungs every 6 (six) hours as needed for wheezing or shortness of breath. 1 Inhaler 2  . cloNIDine (CATAPRES) 0.1 MG tablet Take 1 tablet (0.1 mg total) by mouth 2 (two) times daily. 180 tablet 0  . EPINEPHrine (EPIPEN 2-PAK) 0.3 mg/0.3 mL IJ SOAJ injection Inject 0.3 mg into the muscle once as needed (for an anaphylactic reaction).    . fluticasone (FLONASE) 50 MCG/ACT nasal spray Place 2 sprays into both nostrils daily as needed for allergies or rhinitis. 16 g 2  . hydrochlorothiazide (HYDRODIURIL) 25 MG tablet Take 1 tablet by mouth once daily 30 tablet 0  . levocetirizine (XYZAL) 5 MG tablet Take 1 tablet (5 mg total) by mouth every evening. 90 tablet 1  . montelukast (SINGULAIR) 10 MG tablet Take 1 tablet (10 mg total) by mouth at bedtime. 30 tablet 3  . ondansetron (ZOFRAN ODT) 4 MG disintegrating tablet Take 1 tablet (4 mg total) by mouth every 8 (eight) hours as needed for nausea or vomiting. 20 tablet 0  . pantoprazole (PROTONIX) 40 MG tablet Take 1 tablet (40 mg total) by mouth 2 (two) times daily before a meal. 180 tablet 0  . metroNIDAZOLE (FLAGYL) 500 MG tablet Take 1 tablet (500 mg total) by mouth 2  (two) times daily. 14 tablet 0   No current facility-administered medications for this visit.     Allergies as of 04/22/2019 - Review Complete 04/22/2019  Allergen Reaction Noted  . Vicodin [hydrocodone-acetaminophen] Other (See Comments) 05/04/2014  . Benazepril Swelling 02/21/2018  . Betadine [povidone iodine] Other (See Comments) 07/12/2011  . Crab [shellfish allergy] Hives 03/04/2019  . Other Hives and Itching 07/07/2018    Family History  Problem Relation Age of Onset  . Diabetes Mother   . Thyroid disease Mother   . Stroke Father   . Mental retardation Maternal Aunt   . Diabetes Maternal Grandmother     Social History   Socioeconomic History  . Marital status: Divorced    Spouse name: Not on file  . Number of children: 2  . Years of education: Not on file  . Highest education level: Bachelor's degree (e.g., BA, AB, BS)  Occupational History  . Occupation: Training and development officer: [employer]  . Financial resource strain: Not on file  . Food insecurity:    Worry: Not on file    Inability: Not on file  . Transportation needs:  Medical: Not on file    Non-medical: Not on file  Tobacco Use  . Smoking status: Never Smoker  . Smokeless tobacco: Never Used  Substance and Sexual Activity  . Alcohol use: Not Currently    Alcohol/week: 0.0 standard drinks  . Drug use: No  . Sexual activity: Yes    Partners: Male    Birth control/protection: Surgical    Comment: BTSP 2005/TAH/BSO  Lifestyle  . Physical activity:    Days per week: Not on file    Minutes per session: Not on file  . Stress: Not on file  Relationships  . Social connections:    Talks on phone: Not on file    Gets together: Not on file    Attends religious service: Not on file    Active member of club or organization: Not on file    Attends meetings of clubs or organizations: Not on file    Relationship status: Not on file  . Intimate partner violence:    Fear of  current or ex partner: Not on file    Emotionally abused: Not on file    Physically abused: Not on file    Forced sexual activity: Not on file  Other Topics Concern  . Not on file  Social History Narrative   Divorced mother of 2.  Lives with her son.  Works in Science writer work.  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    Review of Systems: ALL ROS discussed and all others negative except listed in HPI.  Physical Exam: General: in no acute distress Neuro: Alert and appropriate GI: Localizes pain to lower abdomen Psych: Normal affect and normal insight   Tenia Goh L. Tarri Glenn, MD, MPH [facility] 04/22/2019, 7:20 PM

## 2019-04-22 NOTE — Telephone Encounter (Signed)
Notes recorded by Burnice Logan, RN on 04/22/2019 at 12:38 PM EDT Left message to call Sharee Pimple, RN at [facility].

## 2019-04-22 NOTE — Patient Instructions (Addendum)
I recommend taking pantoprazole 40 mg twice daily. Ideally, you should take it 30 minutes before meals. I have sent this rx to your pharmacy.  Avoid any dietary triggers.  Avoid spicy and acidic foods.  Limit your intake of coffee, tea, alcohol, and carbonated drinks.  Work to maintain a healthy weight.  Keep the head of the bed elevated with blocks if you are having any nighttime symptoms.  Stay upright for 2 hours after eating.  Avoid meals and snacks three to four hours before bedtime.  I have recommend an ultrasound to evaluate your gallbladder and pancreas and the tubes (known as ducts) around them. If the ultrasound is normal, we will plan a HIDA scan to test the gallbladder function. If that is normal, we will proceed with an upper endoscopy (EGD) for further evaluation.  [facility] will call you to set this up, if you need to reach them their number is [phone].  Please call with any questions or concerns that develop in the meantime.   Thank you for your patience with me and our technology today! Please stay home, safe, and healthy. I look forward to meeting you in person in the future.

## 2019-04-22 NOTE — Telephone Encounter (Signed)
Spoke with patient, advised as seen below per Dr. Talbert Nan. Rx for flagyl to verified pharmacy. ETOH precautions reviewed. Patient verbalizes understanding and is agreeable.    Encounter closed.

## 2019-04-27 ENCOUNTER — Other Ambulatory Visit: Payer: Self-pay | Admitting: Obstetrics and Gynecology

## 2019-04-27 DIAGNOSIS — Z1231 Encounter for screening mammogram for malignant neoplasm of breast: Secondary | ICD-10-CM

## 2019-04-29 DIAGNOSIS — J0141 Acute recurrent pansinusitis: Secondary | ICD-10-CM | POA: Diagnosis not present

## 2019-04-29 DIAGNOSIS — J329 Chronic sinusitis, unspecified: Secondary | ICD-10-CM | POA: Insufficient documentation

## 2019-04-29 DIAGNOSIS — J302 Other seasonal allergic rhinitis: Secondary | ICD-10-CM | POA: Diagnosis not present

## 2019-05-03 ENCOUNTER — Emergency Department (HOSPITAL_BASED_OUTPATIENT_CLINIC_OR_DEPARTMENT_OTHER): Payer: 59

## 2019-05-03 ENCOUNTER — Encounter (HOSPITAL_BASED_OUTPATIENT_CLINIC_OR_DEPARTMENT_OTHER): Payer: Self-pay | Admitting: Emergency Medicine

## 2019-05-03 ENCOUNTER — Emergency Department (HOSPITAL_BASED_OUTPATIENT_CLINIC_OR_DEPARTMENT_OTHER)
Admission: EM | Admit: 2019-05-03 | Discharge: 2019-05-03 | Disposition: A | Discharge status: Home / Self Care | Payer: 59 | Attending: Emergency Medicine | Admitting: Emergency Medicine

## 2019-05-03 ENCOUNTER — Other Ambulatory Visit: Payer: Self-pay

## 2019-05-03 DIAGNOSIS — R109 Unspecified abdominal pain: Secondary | ICD-10-CM

## 2019-05-03 DIAGNOSIS — R10819 Abdominal tenderness, unspecified site: Secondary | ICD-10-CM | POA: Insufficient documentation

## 2019-05-03 DIAGNOSIS — R51 Headache: Secondary | ICD-10-CM | POA: Insufficient documentation

## 2019-05-03 DIAGNOSIS — J45909 Unspecified asthma, uncomplicated: Secondary | ICD-10-CM | POA: Diagnosis not present

## 2019-05-03 DIAGNOSIS — R1030 Lower abdominal pain, unspecified: Secondary | ICD-10-CM | POA: Diagnosis present

## 2019-05-03 DIAGNOSIS — E876 Hypokalemia: Secondary | ICD-10-CM | POA: Diagnosis not present

## 2019-05-03 DIAGNOSIS — R1084 Generalized abdominal pain: Secondary | ICD-10-CM | POA: Diagnosis not present

## 2019-05-03 DIAGNOSIS — I10 Essential (primary) hypertension: Secondary | ICD-10-CM | POA: Insufficient documentation

## 2019-05-03 DIAGNOSIS — Z79899 Other long term (current) drug therapy: Secondary | ICD-10-CM | POA: Diagnosis not present

## 2019-05-03 LAB — URINALYSIS, ROUTINE W REFLEX MICROSCOPIC
Bilirubin Urine: NEGATIVE
Glucose, UA: NEGATIVE mg/dL
Ketones, ur: NEGATIVE mg/dL
Leukocytes,Ua: NEGATIVE
Nitrite: NEGATIVE
Protein, ur: NEGATIVE mg/dL
Specific Gravity, Urine: 1.015 (ref 1.005–1.030)
pH: 7 (ref 5.0–8.0)

## 2019-05-03 LAB — CBC WITH DIFFERENTIAL/PLATELET
Abs Immature Granulocytes: 0.02 10*3/uL (ref 0.00–0.07)
Basophils Absolute: 0 10*3/uL (ref 0.0–0.1)
Basophils Relative: 0 %
Eosinophils Absolute: 0.1 10*3/uL (ref 0.0–0.5)
Eosinophils Relative: 1 %
HCT: 39.2 % (ref 36.0–46.0)
Hemoglobin: 13.6 g/dL (ref 12.0–15.0)
Immature Granulocytes: 0 %
Lymphocytes Relative: 41 %
Lymphs Abs: 3.5 10*3/uL (ref 0.7–4.0)
MCH: 27.6 pg (ref 26.0–34.0)
MCHC: 34.7 g/dL (ref 30.0–36.0)
MCV: 79.7 fL — ABNORMAL LOW (ref 80.0–100.0)
Monocytes Absolute: 0.9 10*3/uL (ref 0.1–1.0)
Monocytes Relative: 10 %
Neutro Abs: 4 10*3/uL (ref 1.7–7.7)
Neutrophils Relative %: 48 %
Platelets: 282 10*3/uL (ref 150–400)
RBC: 4.92 MIL/uL (ref 3.87–5.11)
RDW: 14.3 % (ref 11.5–15.5)
WBC: 8.5 10*3/uL (ref 4.0–10.5)
nRBC: 0 % (ref 0.0–0.2)

## 2019-05-03 LAB — URINALYSIS, MICROSCOPIC (REFLEX)

## 2019-05-03 LAB — COMPREHENSIVE METABOLIC PANEL
ALT: 15 U/L (ref 0–44)
AST: 16 U/L (ref 15–41)
Albumin: 3.5 g/dL (ref 3.5–5.0)
Alkaline Phosphatase: 38 U/L (ref 38–126)
Anion gap: 8 (ref 5–15)
BUN: 9 mg/dL (ref 6–20)
CO2: 26 mmol/L (ref 22–32)
Calcium: 8.9 mg/dL (ref 8.9–10.3)
Chloride: 104 mmol/L (ref 98–111)
Creatinine, Ser: 0.86 mg/dL (ref 0.44–1.00)
GFR calc Af Amer: >60 mL/min (ref >60)
GFR calc non Af Amer: >60 mL/min (ref >60)
Glucose, Bld: 85 mg/dL (ref 70–99)
Potassium: 2.8 mmol/L — ABNORMAL LOW (ref 3.5–5.1)
Sodium: 138 mmol/L (ref 135–145)
Total Bilirubin: 0.4 mg/dL (ref 0.3–1.2)
Total Protein: 7 g/dL (ref 6.5–8.1)

## 2019-05-03 LAB — LIPASE, BLOOD: Lipase: 40 U/L (ref 11–51)

## 2019-05-03 MED ORDER — POTASSIUM CHLORIDE ER 20 MEQ PO TBCR: 20.0000 meq | EXTENDED_RELEASE_TABLET | Freq: Every day | ORAL | 0 refills | Status: DC

## 2019-05-03 MED ORDER — ONDANSETRON HCL 4 MG/2ML IJ SOLN
4.0000 mg | Freq: Once | INTRAMUSCULAR | Status: AC
  Administered 2019-05-03: 19:00:00 4 mg via INTRAVENOUS
  Filled 2019-05-03: qty 2

## 2019-05-03 MED ORDER — SODIUM CHLORIDE 0.9 % IV BOLUS
1000.0000 mL | Freq: Once | INTRAVENOUS | Status: AC
  Administered 2019-05-03: 1000 mL via INTRAVENOUS

## 2019-05-03 MED ORDER — POTASSIUM CHLORIDE CRYS ER 20 MEQ PO TBCR
40.0000 meq | EXTENDED_RELEASE_TABLET | Freq: Once | ORAL | Status: AC
  Administered 2019-05-03: 20:00:00 40 meq via ORAL
  Filled 2019-05-03: qty 2

## 2019-05-03 MED ORDER — ONDANSETRON 4 MG PO TBDP: 4.0000 mg | ORAL_TABLET | Freq: Three times a day (TID) | ORAL | 0 refills | Status: DC | PRN

## 2019-05-03 MED ORDER — IOHEXOL 300 MG/ML  SOLN
100.0000 mL | Freq: Once | INTRAMUSCULAR | Status: AC
  Administered 2019-05-03: 100 mL via INTRAVENOUS

## 2019-05-03 MED ORDER — POTASSIUM CHLORIDE 10 MEQ/100ML IV SOLN
10.0000 meq | Freq: Once | INTRAVENOUS | Status: AC
  Administered 2019-05-03: 10 meq via INTRAVENOUS
  Filled 2019-05-03: qty 100

## 2019-05-03 NOTE — ED Provider Notes (Signed)
[facility] DEPART[facility] Provider Note   CSN: [id number] Arrival date & time: 05/03/19  1802    History   Chief Complaint Chief Complaint  Patient presents with   Abdominal Pain   Headache    HPI Mary COOPERMAN is a 43 y.o. female with PMH/o Sickle cell train, anxiety, depression, HTN who presents for evaluation of progressively worsening abdominal pain, nausea, decreased appetite, weight loss.  She reports that she has had these symptoms for approximately 6 to 7 weeks.  She had initially called her primary care doctor for evaluation of symptoms.  At that time, they thought it might be related to GERD.  She was in a prescription for a PPI which she states she has been taking.  Patient states that she is continued to have decreased appetite, nausea.  She also reports that she has had pain to her lower abdomen as well as bloating.  She feels like her symptoms are worsened after she eats.  She has not had any vomiting, diarrhea, black tarry stools.  She does report that she has had about a 12 pound weight loss since beginning of March that has been unintentional.  She has not had any fevers or night sweats.  She was referred to a GI doctor which she met through telemedicine a few weeks ago.  They did arrange for right upper quadrant ultrasound which was scheduled to be done this week.  Patient reports she comes in the emergency department today because she states that she is continued to have worsening pain over the weekend.  She states that she felt like her lower abdomen was bloating.  Patient states she has not had any chest pain, difficulty breathing, urinary complaints.     The history is provided by the patient.    Past Medical History:  Diagnosis Date   Angio-edema    Anxiety    Asthma    no symptoms in "years", no inhaler   Depression    Herpes simplex    Hiatal hernia    Hyperlipidemia due to dietary fat intake    Hyperlysinemia (HCC)     Hypertension    Sickle cell trait ([place])    Vitamin D deficiency     Patient Active Problem List   Diagnosis Date Noted   Chest pain with moderate risk for cardiac etiology 09/17/2018   DOE (dyspnea on exertion) 09/17/2018   Obesity (BMI 30.0-34.9) 07/02/2017   MDD (major depressive disorder) 07/16/2016   Asthma, mild 04/11/2015   GAD (generalized anxiety disorder) 02/08/2015   Hyperlipidemia 07/09/2014   Insomnia 07/09/2014   Lipoma of abdominal wall s/p excision 05/04/2014 05/19/2014   Status post total abdominal hysterectomy 05/04/2014   Hypertension    Vitamin D deficiency     Past Surgical History:  Procedure Laterality Date   ABDOMINAL HYSTERECTOMY N/A 05/04/2014   Procedure: HYSTERECTOMY ABDOMINAL with BILATERAL SALPINGECTOMY ;  Surgeon: Everardo All Amundson de Berton Lan, MD;  Location: [facility];  Service: Gynecology;  Laterality: N/A;   BREAST BIOPSY Right Jan. 2016   benign fibroadenoma   FOOT SURGERY     LAPAROTOMY N/A 05/04/2014   Procedure: REMOVAL OF ABD WALL MASS ;  Surgeon: Adin Hector, MD;  Location: [facility];  Service: General;  Laterality: N/A;   LIPOMA EXCISION N/A 05/04/2014   Procedure: EXCISION LIPOMA of abdominal wall;  Surgeon: Jamey Reas de Berton Lan, MD;  Location: [facility];  Service: Gynecology;  Laterality: N/A;  NASAL SINUS SURGERY  2019   PELVIC LAPAROSCOPY Right 2018   oophorectomy   TUBAL LIGATION       OB History    Gravida  4   Para  2   Term      Preterm      AB  1   Living  2     SAB      TAB  1   Ectopic      Multiple      Live Births               Home Medications    Prior to Admission medications   Medication Sig Start Date End Date Taking? Authorizing Provider  acetaminophen (TYLENOL) 500 MG tablet Take 1,000 mg by mouth every 6 (six) hours as needed (for pain or headaches).   Yes [provider]  albuterol (PROVENTIL HFA;VENTOLIN HFA) 108 (90 Base) MCG/ACT inhaler  Inhale 2 puffs into the lungs every 6 (six) hours as needed for wheezing or shortness of breath. 01/09/19  Yes [place], Modena Nunnery, MD  cloNIDine (CATAPRES) 0.1 MG tablet Take 1 tablet (0.1 mg total) by mouth 2 (two) times daily. 04/06/19  Yes [place], Modena Nunnery, MD  fluticasone [facility]) 50 MCG/ACT nasal spray Place 2 sprays into both nostrils daily as needed for allergies or rhinitis. 01/09/19  Yes [place], Modena Nunnery, MD  hydrochlorothiazide (HYDRODIURIL) 25 MG tablet Take 1 tablet by mouth once daily 04/20/19  Yes [place], Modena Nunnery, MD  levocetirizine (XYZAL) 5 MG tablet Take 1 tablet (5 mg total) by mouth every evening. 03/04/19  Yes Delsa Grana, PA-C  montelukast (SINGULAIR) 10 MG tablet Take 1 tablet (10 mg total) by mouth at bedtime. 01/09/19  Yes [place], Modena Nunnery, MD  pantoprazole (PROTONIX) 40 MG tablet Take 1 tablet (40 mg total) by mouth 2 (two) times daily before a meal. 04/22/19  Yes [place], MD  EPINEPHrine (EPIPEN 2-PAK) 0.3 mg/0.3 mL IJ SOAJ injection Inject 0.3 mg into the muscle once as needed (for an anaphylactic reaction).    [provider]  metroNIDAZOLE (FLAGYL) 500 MG tablet Take 1 tablet (500 mg total) by mouth 2 (two) times daily. 04/22/19   Salvadore Dom, MD  ondansetron (ZOFRAN ODT) 4 MG disintegrating tablet Take 1 tablet (4 mg total) by mouth every 8 (eight) hours as needed for nausea or vomiting. 05/03/19   [place] Lanius A, PA-C  potassium chloride 20 MEQ TBCR Take 20 mEq by mouth daily for 7 days. 05/03/19 05/10/19  Volanda Napoleon, PA-C    Family History Family History  Problem Relation Age of Onset   Diabetes Mother    Thyroid disease Mother    Stroke Father    Mental retardation Maternal Aunt    Diabetes Maternal Grandmother     Social History Social History   Tobacco Use   Smoking status: Never Smoker   Smokeless tobacco: Never Used  Substance Use Topics   Alcohol use: Not Currently    Alcohol/week: 0.0 standard drinks   Drug  use: No     Allergies   Vicodin [hydrocodone-acetaminophen]; Benazepril; Betadine [povidone iodine]; Crab [shellfish allergy]; and Other   Review of Systems Review of Systems  Constitutional: Positive for appetite change and unexpected weight change. Negative for activity change, diaphoresis and fever.  Respiratory: Negative for cough and shortness of breath.   Cardiovascular: Negative for chest pain.  Gastrointestinal: Positive for abdominal pain and nausea. Negative for blood in stool, diarrhea and  vomiting.  Genitourinary: Negative for dysuria and hematuria.  Neurological: Negative for headaches.  All other systems reviewed and are negative.    Physical Exam Updated Vital Signs BP (!) 137/99    Pulse 80    Temp 98.6 F (37 C) (Oral)    Resp 12    Ht '5\' 5"'  (1.651 m)    Wt 78.9 kg    LMP 04/17/2014 Comment: hysterectomy w/ single oophorectomy//a.c.   SpO2 100%    BMI 28.96 kg/m   Physical Exam Vitals signs and nursing note reviewed.  Constitutional:      Appearance: Normal appearance. She is well-developed.     Comments: Appears uncomfortable no acute distress   HENT:     Head: Normocephalic and atraumatic.  Eyes:     General: Lids are normal.     Conjunctiva/sclera: Conjunctivae normal.     Pupils: Pupils are equal, round, and reactive to light.  Neck:     Musculoskeletal: Full passive range of motion without pain.  Cardiovascular:     Rate and Rhythm: Normal rate and regular rhythm.     Pulses: Normal pulses.          Radial pulses are 2+ on the right side and 2+ on the left side.       Dorsalis pedis pulses are 2+ on the right side and 2+ on the left side.     Heart sounds: Normal heart sounds. No murmur. No friction rub. No gallop.   Pulmonary:     Effort: Pulmonary effort is normal.     Breath sounds: Normal breath sounds.     Comments: Lungs clear to auscultation bilaterally.  Symmetric chest rise.  No wheezing, rales, rhonchi. Abdominal:     Palpations:  Abdomen is soft. Abdomen is not rigid.     Tenderness: There is abdominal tenderness in the right upper quadrant, right lower quadrant, suprapubic area and left lower quadrant. There is no guarding.     Comments: Abdomen is soft, non-distended. Tenderness noted to the lower abdomen as well as RUQ. No rigidity, no guarding.   Musculoskeletal: Normal range of motion.  Skin:    General: Skin is warm and dry.     Capillary Refill: Capillary refill takes less than 2 seconds.  Neurological:     Mental Status: She is alert and oriented to person, place, and time.  Psychiatric:        Speech: Speech normal.      ED Treatments / Results  Labs (all labs ordered are listed, but only abnormal results are displayed) Labs Reviewed  CBC WITH DIFFERENTIAL/PLATELET - Abnormal; Notable for the following components:      Result Value   MCV 79.7 (*)    All other components within normal limits  COMPREHENSIVE METABOLIC PANEL - Abnormal; Notable for the following components:   Potassium 2.8 (*)    All other components within normal limits  URINALYSIS, ROUTINE W REFLEX MICROSCOPIC - Abnormal; Notable for the following components:   Hgb urine dipstick SMALL (*)    All other components within normal limits  URINALYSIS, MICROSCOPIC (REFLEX) - Abnormal; Notable for the following components:   Bacteria, UA RARE (*)    All other components within normal limits  LIPASE, BLOOD    EKG EKG Interpretation  Date/Time:  Sunday May 03 2019 19:52:23 EDT Ventricular Rate:  82 PR Interval:    QRS Duration: 87 QT Interval:  540 QTC Calculation: 631 R Axis:   46 Text Interpretation:  Sinus rhythm Low voltage, precordial leads Borderline T abnormalities, anterior leads Prolonged QT interval No significant change was found Confirmed by Jola Schmidt [phone]) on 05/03/2019 8:14:56 PM   Radiology Ct Abdomen Pelvis W Contrast  Result Date: 05/03/2019 CLINICAL DATA:  Right upper quadrant pain EXAM: CT ABDOMEN AND  PELVIS WITH CONTRAST TECHNIQUE: Multidetector CT imaging of the abdomen and pelvis was performed using the standard protocol following bolus administration of intravenous contrast. CONTRAST:  133m OMNIPAQUE IOHEXOL 300 MG/ML  SOLN COMPARISON:  01/28/2013 FINDINGS: Lower chest: No acute abnormality.  Small hiatal hernia. Hepatobiliary: No focal liver abnormality is seen. No gallstones, gallbladder wall thickening, or biliary dilatation. Pancreas: Unremarkable. No pancreatic ductal dilatation or surrounding inflammatory changes. Spleen: Normal in size without focal abnormality. Adrenals/Urinary Tract: Adrenal glands are unremarkable. Kidneys are normal, without renal calculi, focal lesion, or hydronephrosis. Bladder is unremarkable. Stomach/Bowel: Stomach is within normal limits. Appendix appears normal. No evidence of bowel wall thickening, distention, or inflammatory changes. Vascular/Lymphatic: No significant vascular findings are present. No enlarged abdominal or pelvic lymph nodes. Reproductive: Status post hysterectomy. Other: No abdominal wall hernia or abnormality. No abdominopelvic ascites. Musculoskeletal: No acute or significant osseous findings. IMPRESSION: 1.  No CT findings of the right upper quadrant to explain pain. 2.  Small hiatal hernia. Electronically Signed   By: AEddie CandleM.D.   On: 05/03/2019 20:52   [place]dom[place] Limited Ruq  Result Date: 05/03/2019 CLINICAL DATA:  One month history of postprandial abdominal pain, nausea and bloating. EXAM: ULTRASOUND ABDOMEN LIMITED RIGHT UPPER QUADRANT COMPARISON:  None. FINDINGS: Gallbladder: No shadowing gallstones or echogenic sludge. No gallbladder wall thickening or pericholecystic fluid. Negative sonographic Murphy sign according to the ultrasound technologist. Common bile duct: Diameter: Approximately 3 mm Liver: Diffusely increased and coarsened echotexture with focal sparing surrounding the gallbladder. No hepatic parenchymal masses. Portal vein  is patent on color Doppler imaging with normal direction of blood flow towards the liver. IMPRESSION: 1. Diffuse hepatic steatosis and/or hepatocellular disease with focal sparing surrounding the gallbladder. No hepatic parenchymal masses. 2. Otherwise normal examination. Electronically Signed   By: TEvangeline DakinM.D.   On: 05/03/2019 20:06    Procedures Procedures (including critical care time)  Medications Ordered in ED Medications  sodium chloride 0.9 % bolus 1,000 mL (0 mLs Intravenous Stopped 05/03/19 2111)  ondansetron (ZOFRAN) injection 4 mg (4 mg Intravenous Given 05/03/19 1851)  potassium chloride 10 mEq in 100 mL IVPB (0 mEq Intravenous Stopped 05/03/19 2111)  potassium chloride SA (K-DUR) CR tablet 40 mEq (40 mEq Oral Given 05/03/19 1956)  iohexol (OMNIPAQUE) 300 MG/ML solution 100 mL (100 mLs Intravenous Contrast Given 05/03/19 2024)     Initial Impression / Assessment and Plan / ED Course  I have reviewed the triage vital signs and the nursing notes.  Pertinent labs & imaging results that were available during my care of the patient were reviewed by me and considered in my medical decision making (see chart for details).        43year old female who presents for evaluation of persistent abdominal pain, nausea, decreased appetite that is been ongoing for the last 6 to 7 weeks.  Seen PCP and was prescribed PPI and has referral to GI.  Scheduled for an ultrasound this week.  Comes in today because pain is worsened. Patient is afebrile, non-toxic appearing, sitting comfortably on examination table. Vital signs reviewed and stable.  On exam, she has diffuse tenderness noted to the lower abdomen as well as right  upper quadrant.  Concern for hepatobiliary etiology versus infectious etiology. History/physical exam is not concerning for ACS etiology, dissection.  Plan for blood work.  Lipase unremarkable.  CBC without any significant leukocytosis or anemia.  CMP shows potassium 2.8.   Otherwise unremarkable. Plan for potassium repletion.   Ultrasound shows diffuse hepatic steatosis and/or hepatocellular disease with focal sparing surrounding the gallbladder.  Gallbladder reveals no signs of cholelithiasis, cholecystitis.  CT shows no acute findings.  No evidence of any acute abnormality.  Discussed results with patient.  Patient is not had any vomiting here in the ED.  Vitals are stable though she is still slightly hypertensive.  Repeat abdominal exam is improved.  Patient stable for discharge at this time.  We will plan to send patient home with Zofran to help with nausea.  Encouraged her to continue taking Protonix as given by her primary care doctor.  She was scheduled to have an appointment with GI this week.  Advised her to call GI discussed with them regarding further work-up. At this time, patient exhibits no emergent life-threatening condition that require further evaluation in ED or admission. Patient had ample opportunity for questions and discussion. All patient's questions were answered with full understanding. Strict return precautions discussed. Patient expresses understanding and agreement to plan.   Portions of this note were generated with Lobbyist. Dictation errors may occur despite best attempts at proofreading.   Final Clinical Impressions(s) / ED Diagnoses   Final diagnoses:  Abdominal pain  Hypokalemia    ED Discharge Orders         Ordered    ondansetron (ZOFRAN ODT) 4 MG disintegrating tablet  Every 8 hours PRN     05/03/19 2139    potassium chloride 20 MEQ TBCR  Daily     05/03/19 2139           Desma Mcgregor 05/03/19 2205    Jola Schmidt, MD 05/03/19 2326

## 2019-05-03 NOTE — ED Notes (Signed)
EKG delayed due to pt in ultrasound

## 2019-05-03 NOTE — ED Triage Notes (Signed)
RUQ pain for several months. Has an appt for [place] this week but states she cannot wait that long. Also reports headache for several weeks.

## 2019-05-03 NOTE — Discharge Instructions (Addendum)
Your blood work today was reassuring.  As we discussed, your ultrasound did not show any abnormalities of the gallbladder.  There were some slight changes in the liver on your ultrasound but this was not seen on CT.  This is something to follow-up with GI regarding.  Take Zofran for nausea.  Continue taking Protonix as directed.  As we discussed, discussed with GI doctor regarding further work-up.  Return the emergency department for any worsening abdominal pain, chest pain, difficulty breathing, vomiting or any other worsening or concerning symptoms.

## 2019-05-04 ENCOUNTER — Telehealth: Payer: Self-pay | Admitting: Gastroenterology

## 2019-05-04 ENCOUNTER — Other Ambulatory Visit: Payer: Self-pay | Admitting: *Deleted

## 2019-05-04 DIAGNOSIS — R109 Unspecified abdominal pain: Secondary | ICD-10-CM

## 2019-05-04 NOTE — Telephone Encounter (Signed)
Spoke with patient who informed this RN that she went to the ED for evaluation for abd pain. The patient wanted Dr. Tarri Glenn to review her imaging (abd [place] and abd CT) completed in the ED on 05/03/19. This RN scheduled the patient for a HIDA scan per Dr. Tarri Glenn plan (office note on 04/22/19) on 05/21/19 at 7:30 am with an arrival time of 7 am, NPO after midnight. Is the patient still required to have the abd [place] complete that is scheduled on 5/21 or will the abd [place] completed in the ED suffice? Please advise.

## 2019-05-04 NOTE — Telephone Encounter (Signed)
Pt called in requesting to speak with the nurse. She had some questions that she wanted to address with the nurse about her upcoming appts since she just had a recent [facility] er visit. Please call to discuss with pt.

## 2019-05-04 NOTE — Telephone Encounter (Signed)
Between the CT scan and the RUQ ultrasound, a repeat ultrasound is not needed. Thank you for ordering the HIDA. Please schedule an encounter with me after the HIDA to review the results. Thanks.

## 2019-05-04 NOTE — Telephone Encounter (Signed)
Spoke to the patient about HIDA scan appt. Cancelled ab [place] appt per Dr. Tarri Glenn (patient already complete imaging during ED visit). Scheduled follow up appt with Dr. Tarri Glenn on 6/10 at 8:30 am to speak to patient about HIDA scan results. The patient expressed no other needs or concerns at the time of the phone call.

## 2019-05-05 ENCOUNTER — Telehealth: Payer: Self-pay | Admitting: *Deleted

## 2019-05-05 NOTE — Telephone Encounter (Signed)
discuss at OV, HCTZ can cause hypokalemia

## 2019-05-05 NOTE — Telephone Encounter (Signed)
Received call from patient.   Reports that she is concerned that her BP medications are causing her recent hypokalemia. Reports that she would like to stop current treatment.   Advised to continue current medications. Appointment scheduled for 05/08/2019.

## 2019-05-07 ENCOUNTER — Other Ambulatory Visit: Payer: 59

## 2019-05-07 ENCOUNTER — Ambulatory Visit: Payer: 59

## 2019-05-08 ENCOUNTER — Other Ambulatory Visit: Payer: Self-pay

## 2019-05-08 ENCOUNTER — Ambulatory Visit: Payer: 59 | Admitting: Family Medicine

## 2019-05-08 VITALS — BP 138/66 | HR 111 | Temp 98.8°F | Resp 16 | Ht 65.0 in | Wt 177.6 lb

## 2019-05-08 DIAGNOSIS — I10 Essential (primary) hypertension: Secondary | ICD-10-CM

## 2019-05-08 DIAGNOSIS — E876 Hypokalemia: Secondary | ICD-10-CM | POA: Diagnosis not present

## 2019-05-08 MED ORDER — SPIRONOLACTONE 25 MG PO TABS: 25.0000 mg | ORAL_TABLET | Freq: Every day | ORAL | 2 refills | Status: DC

## 2019-05-08 NOTE — Progress Notes (Signed)
   Subjective:    Patient ID: Mary Robles, female    DOB: [birth year], 43 y.o.   MRN: [id number]  Patient presents for Follow-up (wants off bp meds due to K dropping) and Weight Loss (not trying to lose weight, lost 25 lbs since March)   Wants to come off HCTZ due to recurrent hypokalemia and the potassium pills make her reflux worse. Has allergy to ACEI    Decreased appetite / GERD, seen by GI, has lostprotonix twice a day, still has symptoms, she is watching, she has   Has loser bowel movel every other day  She has lost 15 pounds since Jan unintentionally. Had CT and [place] in ER , nothing acute found, seen by GI, set up for HIDA scan, next step EGD if HIDA negative   Low potassium- In ER K was 2.8   Recentlu completed flagyl by GYN   Review Of Systems:  GEN- denies fatigue, fever,+ weight loss,weakness, recent illness HEENT- denies eye drainage, change in vision, nasal discharge, CVS- denies chest pain, palpitations RESP- denies SOB, cough, wheeze ABD- + N/ denies V, change in stools,+ abd pain GU- denies dysuria, hematuria, dribbling, incontinence MSK- denies joint pain, muscle aches, injury Neuro- denies headache, dizziness, syncope, seizure activity       Objective:    BP 138/66   Pulse (!) 111   Temp 98.8 F (37.1 C)   Resp 16   Ht 5\' 5"  (1.651 m)   Wt 177 lb 9.6 oz (80.6 kg)   LMP 04/17/2014 Comment: hysterectomy w/ single oophorectomy//a.c.  SpO2 99%   BMI 29.55 kg/m  GEN- NAD, alert and oriented x3 HEENT- PERRL, EOMI, non injected sclera, pink conjunctiva, MMM, oropharynx clear Neck- Supple, no thyromegaly CVS- RRR, no murmur RESP-CTAB ABD-NABS,soft,mild TTP epigastric region, no rebound, no guarding EXT- No edema Pulses- Radial  2+        Assessment & Plan:      Problem List Items Addressed This Visit      Unprioritized   Hypertension (Chronic)    D/C HCTZ, recurrent hypokalemia She has some OTC mag/zinc pills will go ahead and start Add  aldactone 25mg  once a day in its place Check K and Mg level today  Continue clonidine Recheck bmet in 3 weeks      Relevant Medications   spironolactone (ALDACTONE) 25 MG tablet    Other Visit Diagnoses    Hypokalemia    -  Primary   Relevant Orders   Basic metabolic panel (Completed)   Magnesium (Completed)      Note: This dictation was prepared with Dragon dictation along with smaller phrase technology. Any transcriptional errors that result from this process are unintentional.

## 2019-05-08 NOTE — Patient Instructions (Signed)
Stop the HCTZ Start Spironolactone/Aldactone once a day  F/U 3 weeks for repeat potassium check

## 2019-05-09 LAB — BASIC METABOLIC PANEL
BUN: 9 mg/dL (ref 7–25)
CO2: 26 mmol/L (ref 20–32)
Calcium: 9.7 mg/dL (ref 8.6–10.2)
Chloride: 101 mmol/L (ref 98–110)
Creat: 0.88 mg/dL (ref 0.50–1.10)
Glucose, Bld: 91 mg/dL (ref 65–99)
Potassium: 3.9 mmol/L (ref 3.5–5.3)
Sodium: 137 mmol/L (ref 135–146)

## 2019-05-09 LAB — MAGNESIUM: Magnesium: 1.9 mg/dL (ref 1.5–2.5)

## 2019-05-10 ENCOUNTER — Encounter: Payer: Self-pay | Admitting: Family Medicine

## 2019-05-10 NOTE — Assessment & Plan Note (Addendum)
D/C HCTZ, recurrent hypokalemia She has some OTC mag/zinc pills will go ahead and start Add aldactone 25mg  once a day in its place Check K and Mg level today  Continue clonidine Recheck bmet in 3 weeks

## 2019-05-18 ENCOUNTER — Telehealth: Payer: Self-pay | Admitting: Family Medicine

## 2019-05-18 NOTE — Telephone Encounter (Signed)
Spoke with patient and informed her to D/C spironolactone and continue clonidine. Call Thursday with new medication. Patient verbalized understanding.

## 2019-05-18 NOTE — Telephone Encounter (Signed)
Patient called and left a voicemail stating that she has been experiencing lip swelling since starting spironolactone 25 MG. Patient's states that they started swelling on last week. She denies SOB, throat or any other facial swelling. She stated that the swelling has now went down but her lips are extremely dry. Please advise?

## 2019-05-18 NOTE — Telephone Encounter (Signed)
D/C the aldactone  Do not take any other new meds for now Call me with BP readings on Thursday

## 2019-05-21 ENCOUNTER — Ambulatory Visit (HOSPITAL_COMMUNITY)
Admission: RE | Admit: 2019-05-21 | Discharge: 2019-05-21 | Disposition: A | Discharge status: Home / Self Care | Payer: 59 | Source: Ambulatory Visit | Attending: Gastroenterology | Admitting: Gastroenterology

## 2019-05-21 ENCOUNTER — Telehealth: Payer: Self-pay | Admitting: *Deleted

## 2019-05-21 ENCOUNTER — Other Ambulatory Visit: Payer: Self-pay

## 2019-05-21 DIAGNOSIS — R109 Unspecified abdominal pain: Secondary | ICD-10-CM | POA: Diagnosis not present

## 2019-05-21 MED ORDER — TECHNETIUM TC 99M MEBROFENIN IV KIT
5.3000 | PACK | Freq: Once | INTRAVENOUS | Status: AC | PRN
  Administered 2019-05-21: 5.3 via INTRAVENOUS

## 2019-05-21 NOTE — Telephone Encounter (Signed)
Notified the patient of results. Patient requested upcoming appt with Dr. Tarri Glenn be cancelled until after EGD.   Referral faxed to [facility], patient requests Dr. Johney Maine.   Previsit with nurse on 05/28/2019 at 3:30 pm EGD scheduled 06/05/2019 at 9:00 am in [place].

## 2019-05-25 ENCOUNTER — Other Ambulatory Visit: Payer: Self-pay

## 2019-05-25 ENCOUNTER — Encounter (HOSPITAL_COMMUNITY): Payer: Self-pay | Admitting: Emergency Medicine

## 2019-05-25 ENCOUNTER — Ambulatory Visit (HOSPITAL_COMMUNITY)
Admission: EM | Admit: 2019-05-25 | Discharge: 2019-05-25 | Disposition: A | Discharge status: Home / Self Care | Payer: 59 | Attending: Family Medicine | Admitting: Family Medicine

## 2019-05-25 DIAGNOSIS — R220 Localized swelling, mass and lump, head: Secondary | ICD-10-CM

## 2019-05-25 DIAGNOSIS — L299 Pruritus, unspecified: Secondary | ICD-10-CM

## 2019-05-25 MED ORDER — DEXAMETHASONE SODIUM PHOSPHATE 10 MG/ML IJ SOLN
INTRAMUSCULAR | Status: AC
  Filled 2019-05-25: qty 1

## 2019-05-25 MED ORDER — DEXAMETHASONE SODIUM PHOSPHATE 10 MG/ML IJ SOLN
10.0000 mg | Freq: Once | INTRAMUSCULAR | Status: AC
  Administered 2019-05-25: 10 mg via INTRAMUSCULAR

## 2019-05-25 NOTE — Discharge Instructions (Addendum)
Meds ordered this encounter  Medications   dexamethasone (DECADRON) injection 10 mg    

## 2019-05-25 NOTE — ED Triage Notes (Signed)
Pt c/o ongoing facial swellign and dryness, states this has been going on for a while and she has been working on changing her medicines with her PCP, states she has an appt this week but her "face is hurting".

## 2019-05-27 ENCOUNTER — Ambulatory Visit: Payer: 59 | Admitting: Gastroenterology

## 2019-05-27 NOTE — ED Provider Notes (Signed)
[place] [facility]   [id number] 05/25/19 Arrival Time: 9417  ASSESSMENT & PLAN:  1. Swelling of both lips   2. Itching    No signs of infection. Question allergic reaction vs angioedema. No respiratory or swallowing difficulties.  Meds ordered this encounter  Medications  . dexamethasone (DECADRON) injection 10 mg   Close observation.  Follow-up Information    Schedule an appointment as soon as possible for a visit  with Alycia Rossetti, MD.   Specialty:  Family Medicine Contact information: [address] [place] [address] [phone]        [place].   Specialty:  Urgent Care Why:  As needed. Contact information: [place] [phone]         Reviewed expectations re: course of current medical issues. Questions answered. Outlined signs and symptoms indicating need for more acute intervention. Patient verbalized understanding. After Visit Summary given.   SUBJECTIVE:  Mary Robles is a 43 y.o. female who questions an allergic reaction. Reports "feeling like my face is swollen; my lips are definitely swollen". Questions noticing over the past several days. "But the feeling of my face swelling has been there for a few weeks." No new medications. Taken off BP med secondary to this causing symptoms. No swallowing/respiratory difficulties. No sore throat. Normal PO intake without n/v. Fever reported: no. No neck pain or swelling. No associated n/v/abdominal symptoms. Sick contacts: none known. OTC Benadryl; questions mild help.  ROS: As per HPI. All other systems negative.    OBJECTIVE:  Vitals:   05/25/19 1010  BP: (!) 137/96  Pulse: 94  Resp: 18  Temp: 98 F (36.7 C)  SpO2: 100%    General appearance: alert; no distress HEENT: throat appears normal; no oral lesions; tongue normal; uvula midline; both upper and lower lips do appear a little swollen Neck: supple  with FROM; no lymphadenopathy CV: RRR Lungs: clear to auscultation bilaterally Abd: soft; non-tender Skin: reveals no rash; warm and dry Psychological: alert and cooperative; normal mood and affect  Allergies  Allergen Reactions  . Vicodin [Hydrocodone-Acetaminophen] Other (See Comments)    Hallucinations   . Benazepril Swelling    Lips and face became swollen  . Betadine [Povidone Iodine] Other (See Comments)    Blisters   . Crab [Shellfish Allergy] Hives  . Other Hives and Itching    Brazilian nuts     Past Medical History:  Diagnosis Date  . Angio-edema   . Anxiety   . Asthma    no symptoms in "years", no inhaler  . Depression   . Herpes simplex   . Hiatal hernia   . Hyperlipidemia due to dietary fat intake   . Hyperlysinemia ([place])   . Hypertension   . Sickle cell trait ([place])   . Vitamin D deficiency    Social History   Socioeconomic History  . Marital status: Divorced    Spouse name: Not on file  . Number of children: 2  . Years of education: Not on file  . Highest education level: Bachelor's degree (e.g., BA, AB, BS)  Occupational History  . Occupation: Training and development officer: [employer]  . Financial resource strain: Not on file  . Food insecurity:    Worry: Not on file    Inability: Not on file  . Transportation needs:    Medical: Not on file  Non-medical: Not on file  Tobacco Use  . Smoking status: Never Smoker  . Smokeless tobacco: Never Used  Substance and Sexual Activity  . Alcohol use: Not Currently    Alcohol/week: 0.0 standard drinks  . Drug use: No  . Sexual activity: Yes    Partners: Male    Birth control/protection: Surgical    Comment: BTSP 2005/TAH/BSO  Lifestyle  . Physical activity:    Days per week: Not on file    Minutes per session: Not on file  . Stress: Not on file  Relationships  . Social connections:    Talks on phone: Not on file    Gets together: Not on file    Attends religious  service: Not on file    Active member of club or organization: Not on file    Attends meetings of clubs or organizations: Not on file    Relationship status: Not on file  . Intimate partner violence:    Fear of current or ex partner: Not on file    Emotionally abused: Not on file    Physically abused: Not on file    Forced sexual activity: Not on file  Other Topics Concern  . Not on file  Social History Narrative   Divorced mother of 2.  Lives with her son.  Works in Science writer work.  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   Family History  Problem Relation Age of Onset  . Diabetes Mother   . Thyroid disease Mother   . Stroke Father   . Mental retardation Maternal Aunt   . Diabetes Maternal Izetta Dakin, MD 05/27/19 [phone]

## 2019-05-28 ENCOUNTER — Other Ambulatory Visit: Payer: Self-pay

## 2019-05-28 ENCOUNTER — Ambulatory Visit: Payer: 59 | Admitting: *Deleted

## 2019-05-28 VITALS — Ht 65.0 in | Wt 175.0 lb

## 2019-05-28 DIAGNOSIS — R109 Unspecified abdominal pain: Secondary | ICD-10-CM

## 2019-05-28 NOTE — Progress Notes (Signed)
Patient's pre-visit was done today over the phone with the patient due to COVID-19 pandemic. Name,DOB and address verified. Insurance verified. Packet of Prep instructions mailed to patient including copy of a consent form and pre-procedure patient acknowledgement form-pt is aware. Patient understands to call us back with any questions or concerns.   Patient denies any allergies to eggs or soy. Patient denies any problems with anesthesia/sedation. Patient denies any oxygen use at home. Patient denies taking any diet/weight loss medications or blood thinners. EMMI education assisgned to patient on EGD, this was explained and instructions given to patient.

## 2019-05-29 ENCOUNTER — Ambulatory Visit: Payer: 59 | Admitting: Family Medicine

## 2019-05-29 VITALS — BP 148/100 | HR 85 | Temp 98.5°F | Resp 16 | Wt 177.2 lb

## 2019-05-29 DIAGNOSIS — I10 Essential (primary) hypertension: Secondary | ICD-10-CM

## 2019-05-29 MED ORDER — AMLODIPINE BESYLATE 5 MG PO TABS: 5.0000 mg | ORAL_TABLET | Freq: Every day | ORAL | 3 refills | Status: DC

## 2019-05-29 NOTE — Progress Notes (Signed)
   Subjective:    Patient ID: Mary Robles, female    DOB: 03-21-1976, 43 y.o.   MRN: [id number]  Patient presents for Hypertension  Pt her eto f/u has swelling with aldactone started at last visit in May   Home readings over the week- 135-162/81-111  This morning 129/98- still laying in bed  Still having problems with her stomach  Note she would like to try norvasc again, was on years ago, became sick but was also on HCTZ at that time and having other issues    Seen at urgent had angioedema on 6/8   And facial even thouhh she was off the aldactone but also had fever blisters, given shot of prednisone at UC, blisters went down     Did not tolerate HCTZ due to recurrent hypokalemia   She is now on isotonix vitamins     Review Of Systems:  GEN- denies fatigue, fever, weight loss,weakness, recent illness HEENT- denies eye drainage, change in vision, nasal discharge, CVS- denies chest pain, palpitations RESP- denies SOB, cough, wheeze ABD- denies N/V, change in stools, abd pain GU- denies dysuria, hematuria, dribbling, incontinence MSK- denies joint pain, muscle aches, injury Neuro- denies headache, dizziness, syncope, seizure activity       Objective:    BP (!) 148/100   Pulse 85   Temp 98.5 F (36.9 C)   Resp 16   Wt 177 lb 3.2 oz (80.4 kg)   LMP 04/17/2014 Comment: hysterectomy w/ single oophorectomy//a.c.  SpO2 98%   BMI 29.49 kg/m  GEN- NAD, alert and oriented x3 HEENT- PERRL, EOMI, non injected sclera, pink conjunctiva, MMM, oropharynx clear CVS- RRR, no murmur RESP-CTAB EXT- No edema Pulses- Radial  2+        Assessment & Plan:      Problem List Items Addressed This Visit      Unprioritized   Hypertension - Primary (Chronic)    Pt would like to restart the norvasc  Will put aldactone on allergy list Continue clinidine Send readings via mychart Recheck renal function and potassium with recent angioedema       Relevant Medications   amLODipine (NORVASC) 5 MG tablet   Other Relevant Orders   Basic metabolic panel (Completed)      Note: This dictation was prepared with Dragon dictation along with smaller phrase technology. Any transcriptional errors that result from this process are unintentional.

## 2019-05-29 NOTE — Patient Instructions (Addendum)
Start norvasc 5mg  in addition to clonidine  F/U as previous

## 2019-05-30 ENCOUNTER — Encounter: Payer: Self-pay | Admitting: Family Medicine

## 2019-05-30 LAB — BASIC METABOLIC PANEL
BUN: 10 mg/dL (ref 7–25)
CO2: 28 mmol/L (ref 20–32)
Calcium: 9.4 mg/dL (ref 8.6–10.2)
Chloride: 102 mmol/L (ref 98–110)
Creat: 0.88 mg/dL (ref 0.50–1.10)
Glucose, Bld: 80 mg/dL (ref 65–99)
Potassium: 4.3 mmol/L (ref 3.5–5.3)
Sodium: 138 mmol/L (ref 135–146)

## 2019-05-30 LAB — EXTRA LAV TOP TUBE

## 2019-05-30 NOTE — Assessment & Plan Note (Signed)
Pt would like to restart the norvasc  Will put aldactone on allergy list Continue clinidine Send readings via mychart Recheck renal function and potassium with recent angioedema

## 2019-06-04 ENCOUNTER — Telehealth: Payer: Self-pay | Admitting: Gastroenterology

## 2019-06-04 NOTE — Telephone Encounter (Signed)

## 2019-06-05 ENCOUNTER — Encounter: Payer: Self-pay | Admitting: Gastroenterology

## 2019-06-05 ENCOUNTER — Other Ambulatory Visit: Payer: Self-pay

## 2019-06-05 ENCOUNTER — Ambulatory Visit (AMBULATORY_SURGERY_CENTER): Payer: 59 | Admitting: Gastroenterology

## 2019-06-05 VITALS — BP 117/75 | HR 74 | Temp 98.3°F | Resp 17 | Ht 65.0 in | Wt 177.0 lb

## 2019-06-05 DIAGNOSIS — K449 Diaphragmatic hernia without obstruction or gangrene: Secondary | ICD-10-CM

## 2019-06-05 DIAGNOSIS — K3189 Other diseases of stomach and duodenum: Secondary | ICD-10-CM | POA: Diagnosis present

## 2019-06-05 DIAGNOSIS — K2970 Gastritis, unspecified, without bleeding: Secondary | ICD-10-CM | POA: Diagnosis not present

## 2019-06-05 DIAGNOSIS — R109 Unspecified abdominal pain: Secondary | ICD-10-CM

## 2019-06-05 MED ORDER — SODIUM CHLORIDE 0.9 % IV SOLN: 500.0000 mL | Freq: Once | INTRAVENOUS | Status: DC

## 2019-06-05 NOTE — Progress Notes (Addendum)
Pt's states no medical or surgical changes since previsit or office visit.  Temps taken by CW V/S taken by [place]

## 2019-06-05 NOTE — Patient Instructions (Signed)
YOU HAD AN ENDOSCOPIC PROCEDURE TODAY AT [facility]:   Refer to the procedure report that was given to you for any specific questions about what was found during the examination.  If the procedure report does not answer your questions, please call your gastroenterologist to clarify.  If you requested that your care partner not be given the details of your procedure findings, then the procedure report has been included in a sealed envelope for you to review at your convenience later.  YOU SHOULD EXPECT: Some feelings of bloating in the abdomen. Passage of more gas than usual.  Walking can help get rid of the air that was put into your GI tract during the procedure and reduce the bloating. If you had a lower endoscopy (such as a colonoscopy or flexible sigmoidoscopy) you may notice spotting of blood in your stool or on the toilet paper. If you underwent a bowel prep for your procedure, you may not have a normal bowel movement for a few days.  Please Note:  You might notice some irritation and congestion in your nose or some drainage.  This is from the oxygen used during your procedure.  There is no need for concern and it should clear up in a day or so.  SYMPTOMS TO REPORT IMMEDIATELY:    Following upper endoscopy (EGD)  Vomiting of blood or coffee ground material  New chest pain or pain under the shoulder blades  Painful or persistently difficult swallowing  New shortness of breath  Fever of 100F or higher  Black, tarry-looking stools  For urgent or emergent issues, a gastroenterologist can be reached at any hour by calling [phone].   DIET:  We do recommend a small meal at first, but then you may proceed to your regular diet.  Drink plenty of fluids but you should avoid alcoholic beverages for 24 hours.  ACTIVITY:  You should plan to take it easy for the rest of today and you should NOT DRIVE or use heavy machinery until tomorrow (because of the sedation medicines used  during the test).    FOLLOW UP: Our staff will call the number listed on your records 48-72 hours following your procedure to check on you and address any questions or concerns that you may have regarding the information given to you following your procedure. If we do not reach you, we will leave a message.  We will attempt to reach you two times.  During this call, we will ask if you have developed any symptoms of COVID 19. If you develop any symptoms (ie: fever, flu-like symptoms, shortness of breath, cough etc.) before then, please call [phone].  If you test positive for Covid 19 in the 2 weeks post procedure, please call and report this information to [place].    If any biopsies were taken you will be contacted by phone or by letter within the next 1-3 weeks.  Please call us at [phone] if you have not heard about the biopsies in 3 weeks.    SIGNATURES/CONFIDENTIALITY: You and/or your care partner have signed paperwork which will be entered into your electronic medical record.  These signatures attest to the fact that that the information above on your After Visit Summary has been reviewed and is understood.  Full responsibility of the confidentiality of this discharge information lies with you and/or your care-partner.   Thank you for allowing [place] to provide your healthcare today.

## 2019-06-05 NOTE — Op Note (Signed)
[facility] Patient Name: Mary Robles Procedure Date: 06/05/2019 9:06 AM MRN: [id number] Endoscopist: Thornton Park MD, MD Age: 43 Referring MD:  Date of Birth: Dec 28, 1975 Gender: Female Account #: [id number] Procedure:                Upper GI endoscopy Indications:              Generalized abdominal pain                           Post-prandial abdominal pain with nausea and                            bloating                           - no source identified on abdominal ultrasound, CT                            or HIDA scan (although pain occurred with oral                            Ensure consumption)                           GERD improved with PPI therapy                           Small to moderate sized hiatal hernia seen on CT                            coronary                           Unintentional 12 pound weight loss Medicines:                See the Anesthesia note for documentation of the                            administered medications Procedure:                Pre-Anesthesia Assessment:                           - Prior to the procedure, a History and Physical                            was performed, and patient medications and                            allergies were reviewed. The patient's tolerance of                            previous anesthesia was also reviewed. The risks                            and benefits of the procedure and the sedation  options and risks were discussed with the patient.                            All questions were answered, and informed consent                            was obtained. Prior Anticoagulants: The patient has                            taken no previous anticoagulant or antiplatelet                            agents. ASA Grade Assessment: II - A patient with                            mild systemic disease. After reviewing the risks                            and benefits, the  patient was deemed in                            satisfactory condition to undergo the procedure.                           After obtaining informed consent, the endoscope was                            passed under direct vision. Throughout the                            procedure, the patient's blood pressure, pulse, and                            oxygen saturations were monitored continuously. The                            Endoscope was introduced through the mouth, and                            advanced to the third part of duodenum. The upper                            GI endoscopy was accomplished without difficulty.                            The patient tolerated the procedure well. Scope In: Scope Out: Findings:                 The examined esophagus was normal albeit tortuous.                           The entire examined stomach was normal except for  scattered erythema in the antrum. The mucosa was                            friable. Biopsies were taken with a cold forceps                            for histology. Estimated blood loss was minimal.                           A medium-sized hiatal hernia was present.                           The examined duodenum was normal. The mucosa was                            friable. Biopsies were taken with a cold forceps                            for histology.                           Several small gastric polyps are present consistent                            with fundic gland polyps. The cardia and gastric                            fundus were normal on retroflexion. Complications:            No immediate complications. Estimated blood loss:                            Minimal. Estimated Blood Loss:     Estimated blood loss was minimal. Impression:               - Mildly tortuous esophagus.                           - Mild gastric erythema. Biopsied.                           - Medium-sized hiatal  hernia.                           - Normal examined duodenum. Biopsied. Recommendation:           - Patient has a contact number available for                            emergencies. The signs and symptoms of potential                            delayed complications were discussed with the                            patient. Return to normal activities tomorrow.  Written discharge instructions were provided to the                            patient.                           - Resume regular diet today.                           - Continue present medications.                           - Await pathology results.                           - Consultation with surgery as scheduled for next                            week. Thornton Park MD, MD 06/05/2019 9:19:15 AM This report has been signed electronically.

## 2019-06-05 NOTE — Progress Notes (Signed)
Called to room to assist during endoscopic procedure.  Patient ID and intended procedure confirmed with present staff. Received instructions for my participation in the procedure from the performing physician.  

## 2019-06-05 NOTE — Progress Notes (Signed)
PT taken to PACU. Monitors in place. VSS. Report given to RN. 

## 2019-06-09 ENCOUNTER — Telehealth: Payer: Self-pay

## 2019-06-09 NOTE — Telephone Encounter (Signed)
  Follow up Call-  Call back number 06/05/2019  Post procedure Call Back phone  # [phone]  Permission to leave phone message Yes  Some recent data might be hidden     Patient questions:  Do you have a fever, pain , or abdominal swelling? No. Pain Score  0 *  Have you tolerated food without any problems? Yes.    Have you been able to return to your normal activities? Yes.    Do you have any questions about your discharge instructions: Diet   No. Medications  No. Follow up visit  Yes.  questioned when results will be available  Do you have questions or concerns about your Care? No.  Actions: * If pain score is 4 or above: No action needed, pain <4.  1. Have you developed a fever since your procedure? no  2.   Have you had an respiratory symptoms (SOB or cough) since your procedure? no  3.   Have you tested positive for COVID 19 since your procedure no  4.   Have you had any family members/close contacts diagnosed with the COVID 19 since your procedure?  no   If yes to any of these questions please route to Joylene John, RN and Alphonsa Gin, Therapist, sports.

## 2019-06-10 ENCOUNTER — Encounter: Payer: Self-pay | Admitting: Gastroenterology

## 2019-06-12 ENCOUNTER — Telehealth: Payer: Self-pay | Admitting: *Deleted

## 2019-06-12 ENCOUNTER — Encounter: Payer: Self-pay | Admitting: *Deleted

## 2019-06-12 ENCOUNTER — Other Ambulatory Visit: Payer: Self-pay | Admitting: *Deleted

## 2019-06-12 DIAGNOSIS — R109 Unspecified abdominal pain: Secondary | ICD-10-CM

## 2019-06-12 NOTE — Telephone Encounter (Signed)
Spoke to the patient who is also scheduled for a virtual follow up visit to discuss GES. Appt scheduled for 7/16 at 11:30 am.

## 2019-06-12 NOTE — Telephone Encounter (Signed)
Spoke to patient who is scheduled for a GES at [facility] on 06/22/2019 with an arrival time of 6:45 am with a start time of 7:00 am. Patient told to be NPO at 11:00 pm on 06/21/2019, no food, water or meds including PPI's. Patient told to report to the main entrance at the [facility] and check in a radiology. Patient verbalized understanding. Nothing further at the time of the call.

## 2019-06-16 ENCOUNTER — Telehealth: Payer: Self-pay | Admitting: *Deleted

## 2019-06-16 NOTE — Telephone Encounter (Signed)
Called [facility], spoke to [employer] about the authorization for the GES that has been approved by insurance under the name of Dr. Johney Maine. Ms. Rosana Hoes reported that she would call [facility] to have this corrected and get them another order is need be.

## 2019-06-17 ENCOUNTER — Other Ambulatory Visit: Payer: Self-pay | Admitting: Surgery

## 2019-06-17 ENCOUNTER — Telehealth: Payer: Self-pay | Admitting: Cardiology

## 2019-06-17 DIAGNOSIS — R109 Unspecified abdominal pain: Secondary | ICD-10-CM

## 2019-06-17 NOTE — Telephone Encounter (Signed)
KVM, reminding pt of her appt with Dr Ellyn Hack on 06-18-19.

## 2019-06-18 ENCOUNTER — Ambulatory Visit: Payer: 59

## 2019-06-18 ENCOUNTER — Ambulatory Visit: Payer: 59 | Admitting: Family Medicine

## 2019-06-18 ENCOUNTER — Encounter: Payer: Self-pay | Admitting: Family Medicine

## 2019-06-18 ENCOUNTER — Telehealth (INDEPENDENT_AMBULATORY_CARE_PROVIDER_SITE_OTHER): Payer: 59 | Admitting: Cardiology

## 2019-06-18 ENCOUNTER — Other Ambulatory Visit: Payer: Self-pay

## 2019-06-18 ENCOUNTER — Encounter: Payer: Self-pay | Admitting: Cardiology

## 2019-06-18 ENCOUNTER — Telehealth: Payer: Self-pay | Admitting: *Deleted

## 2019-06-18 VITALS — BP 116/78 | HR 90 | Temp 98.3°F | Resp 14 | Ht 65.0 in | Wt 178.0 lb

## 2019-06-18 VITALS — BP 126/83 | HR 86 | Ht 65.0 in | Wt 177.0 lb

## 2019-06-18 DIAGNOSIS — R079 Chest pain, unspecified: Secondary | ICD-10-CM | POA: Diagnosis not present

## 2019-06-18 DIAGNOSIS — I10 Essential (primary) hypertension: Secondary | ICD-10-CM

## 2019-06-18 DIAGNOSIS — E782 Mixed hyperlipidemia: Secondary | ICD-10-CM | POA: Diagnosis not present

## 2019-06-18 DIAGNOSIS — H109 Unspecified conjunctivitis: Secondary | ICD-10-CM | POA: Diagnosis not present

## 2019-06-18 MED ORDER — POLYMYXIN B-TRIMETHOPRIM 10000-0.1 UNIT/ML-% OP SOLN: OPHTHALMIC | 0 refills | Status: DC

## 2019-06-18 NOTE — Progress Notes (Signed)
   Subjective:    Patient ID: Mary Robles, female    DOB: 06-03-1976, 43 y.o.   MRN: [id number]  Patient presents for Eye Issues (redness and itching to B eyes- crusts to R eye this morning)   Started last night with right eye itching, redness and draining, had crusting to eyes this morning   Exposed to pink eye  No fever  Has itching on face/lipids  No cough or swelling of tongune   HTN- back on norvasc 120-140/70's  Review Of Systems:  GEN- denies fatigue, fever, weight loss,weakness, recent illness HEENT- + eye drainage, denies  change in vision, nasal discharge, CVS- denies chest pain, palpitations RESP- denies SOB, cough, wheeze Neuro- denies headache, dizziness, syncope, seizure activity       Objective:    BP 116/78   Pulse 90   Temp 98.3 F (36.8 C) (Oral)   Resp 14   Ht 5\' 5"  (1.651 m)   Wt 178 lb (80.7 kg)   LMP 04/17/2014 Comment: hysterectomy w/ single oophorectomy//a.c.  SpO2 100%   BMI 29.62 kg/m  GEN- NAD, alert and oriented x3 HEENT- PERRL, EOMI, Right injected sclera, injected right  conjunctiva,  Left sclera white, pink conjunctiva  , vision grossly in tact MMM, oropharynx clear Neck- Supple,no LAD        Assessment & Plan:       Problem List Items Addressed This Visit      Unprioritized   Hypertension (Chronic)    Blood pressure much improved, no SE with norvasc Continue to monitor        Other Visit Diagnoses    Bacterial conjunctivitis of right eye    -  Primary   Treat warm compress, polytrim drops, given note to work from home today to prevent exposure to others       Note: This dictation was prepared with Diplomatic Services operational officer dictation along with smaller Company secretary. Any transcriptional errors that result from this process are unintentional.

## 2019-06-18 NOTE — Patient Instructions (Addendum)
Medication Instructions: none  Tests Ordered: No orders of the defined types were placed in this encounter. none  Medication Changes: No orders of the defined types were placed in this encounter.   Disposition:  Follow up prn Medication Instructions:  NO CHANGES    Lab work:  NOT NEEDED  Testing/Procedures:  NOT NEEDED Follow-Up: At [facility], you and your health needs are our priority.  As part of our continuing mission to provide you with exceptional heart care, we have created designated Provider Care Teams.  These Care Teams include your primary Cardiologist (physician) and Advanced Practice Providers (APPs -  Physician Assistants and Nurse Practitioners) who all work together to provide you with the care you need, when you need it. . You will need a follow up appointment  ON AN AS NEEDED BASIS   Any Other Special Instructions Will Be Listed Below (If Applicable).

## 2019-06-18 NOTE — Telephone Encounter (Signed)
SPOKE TO PATIENT - INSTRUCTION GIVEN FOR TEL-VISIT 06/18/19 . AVS SUMMARY SENT VIA MYCHART.  PATIENT VERBALIZED UNDERSTANDNING

## 2019-06-18 NOTE — Patient Instructions (Signed)
F/U as needed

## 2019-06-18 NOTE — Progress Notes (Signed)
Virtual Visit via Telephone Note   This visit type was conducted due to national recommendations for restrictions regarding the COVID-19 Pandemic (e.g. social distancing) in an effort to limit this patient's exposure and mitigate transmission in our community.  Due to her co-morbid illnesses, this patient is at least at moderate risk for complications without adequate follow up.  This format is felt to be most appropriate for this patient at this time.  The patient did not have access to video technology/had technical difficulties with video requiring transitioning to audio format only (telephone).  All issues noted in this document were discussed and addressed.  No physical exam could be performed with this format.  Please refer to the patient's chart for her  consent to telehealth for [facility].   Patient has given verbal permission to conduct this visit via virtual appointment and to bill insurance 06/18/2019 9:36 AM     Evaluation Performed:  Follow-up visit  Date:  06/18/2019   ID:  Mary Robles, DOB 24-May-1976, MRN [id number]  Patient Location: Home Provider Location: Office  PCP:  Alycia Rossetti, MD  Cardiologist:  No primary care provider on file.  Electrophysiologist:  None   Chief Complaint:  8 month f/u  History of Present Illness:    Mary Robles is a 43 y.o. female with PMH notable for HTN who presents via audio/video conferencing for a telehealth visit today.  Mary Robles was last seen in October 2019 for chest pain associated with dyspnea.  She was evaluated with a coronary artery CT angiogram.   CT Coronary Angiogram October 24, 2018: Coronary calcium 0.  No significant CAD.  She was then seen by Jory Sims, NP for follow-up in December.  No major cardiac complaints.  Interval History: Mary Robles has been doing pretty well overall from a cardiac standpoint.  She has been dealing with a lot of GI issues, and is seeing a gastroenterologist and GI surgeon.   There is concern about possible hiatal hernia versus gallbladder disease.  She says that her bowel habits are "all off ".  CP from GERD - not with exertion.   Chronic mild SOB from Asthma  Cardiovascular ROS: positive for - dyspnea on exertion and shortness of breath negative for - edema, irregular heartbeat, orthopnea, palpitations, paroxysmal nocturnal dyspnea, rapid heart rate or syncope / near syncope  The patient does not have symptoms concerning for COVID-19 infection (fever, chills, cough, or new shortness of breath).  The patient is practicing social distancing.  ROS:  Please see the history of present illness.   Headed to doctor for "pink eye".    Review of Systems  Constitutional: Negative for chills, fever and malaise/fatigue.  HENT: Negative for nosebleeds.   Respiratory: Positive for shortness of breath (chronic).   Gastrointestinal: Positive for abdominal pain (worse after eating), heartburn and nausea. Negative for blood in stool, diarrhea (off & on loose stools), melena and vomiting.       Strange bowel pattern; poor appetite  Genitourinary: Negative for hematuria.  Neurological: Negative for dizziness.    Past Medical History:  Diagnosis Date  . Allergy   . Angio-edema   . Anxiety   . Asthma    no symptoms in "years", no inhaler  . Depression   . Herpes simplex   . Hiatal hernia   . Hyperlipidemia due to dietary fat intake   . Hyperlysinemia ([place])   . Hypertension   . Sickle cell trait ([place])   .  Vitamin D deficiency    Past Surgical History:  Procedure Laterality Date  . ABDOMINAL HYSTERECTOMY N/A 05/04/2014   Procedure: HYSTERECTOMY ABDOMINAL with BILATERAL SALPINGECTOMY ;  Surgeon: Everardo All Amundson de Berton Lan, MD;  Location: [facility];  Service: Gynecology;  Laterality: N/A;  . BREAST BIOPSY Right Jan. 2016   benign fibroadenoma  . FOOT SURGERY    . LAPAROTOMY N/A 05/04/2014   Procedure: REMOVAL OF ABD WALL MASS ;  Surgeon: Adin Hector, MD;   Location: [facility];  Service: General;  Laterality: N/A;  . LIPOMA EXCISION N/A 05/04/2014   Procedure: EXCISION LIPOMA of abdominal wall;  Surgeon: Jamey Reas de Berton Lan, MD;  Location: [facility];  Service: Gynecology;  Laterality: N/A;  . NASAL SINUS SURGERY  2019  . PELVIC LAPAROSCOPY Right 2018   oophorectomy  . TUBAL LIGATION       Current Meds  Medication Sig  . acetaminophen (TYLENOL) 500 MG tablet Take 1,000 mg by mouth every 6 (six) hours as needed (for pain or headaches).  Marland Kitchen albuterol (PROVENTIL HFA;VENTOLIN HFA) 108 (90 Base) MCG/ACT inhaler Inhale 2 puffs into the lungs every 6 (six) hours as needed for wheezing or shortness of breath.  Marland Kitchen amLODipine (NORVASC) 5 MG tablet Take 1 tablet (5 mg total) by mouth daily.  . cloNIDine (CATAPRES) 0.1 MG tablet Take 1 tablet (0.1 mg total) by mouth 2 (two) times daily.  Marland Kitchen EPINEPHrine (EPIPEN 2-PAK) 0.3 mg/0.3 mL IJ SOAJ injection Inject 0.3 mg into the muscle once as needed (for an anaphylactic reaction).  . fluticasone (FLONASE) 50 MCG/ACT nasal spray Place 2 sprays into both nostrils daily as needed for allergies or rhinitis.  Marland Kitchen levocetirizine (XYZAL) 5 MG tablet Take 1 tablet (5 mg total) by mouth every evening.  . Multiple Vitamin (MULTIVITAMIN) tablet Take 1 tablet by mouth daily.  . pantoprazole (PROTONIX) 40 MG tablet Take 1 tablet (40 mg total) by mouth 2 (two) times daily before a meal.     Allergies:   Vicodin [hydrocodone-acetaminophen], Aldactone [spironolactone], Benazepril, Betadine [povidone iodine], Other, and Shellfish allergy ; HCTZ lead to hypokalemia  Social History   Tobacco Use  . Smoking status: Never Smoker  . Smokeless tobacco: Never Used  Substance Use Topics  . Alcohol use: Not Currently    Alcohol/week: 0.0 standard drinks  . Drug use: No     Family Hx: The patient's family history includes Colon polyps in her mother; Diabetes in her maternal grandmother and mother; Mental retardation in her  maternal aunt; Stroke in her father; Thyroid disease in her mother. There is no history of Colon cancer, Esophageal cancer, Stomach cancer, or Rectal cancer.   Prior CV studies:   The following studies were reviewed today: . See above  Labs/Other Tests and Data Reviewed:    EKG3:  No ECG reviewed.  Recent Labs: 04/03/2019: TSH 1.24 05/03/2019: ALT 15; Hemoglobin 13.6; Platelets 282 05/08/2019: Magnesium 1.9 05/29/2019: BUN 10; Creat 0.88; Potassium 4.3; Sodium 138   Recent Lipid Panel Lab Results  Component Value Date/Time   CHOL 157 01/09/2019 08:18 AM   TRIG 118 01/09/2019 08:18 AM   HDL 37 (L) 01/09/2019 08:18 AM   CHOLHDL 4.2 01/09/2019 08:18 AM   LDLCALC 99 01/09/2019 08:18 AM    Wt Readings from Last 3 Encounters:  06/18/19 177 lb (80.3 kg)  06/05/19 177 lb (80.3 kg)  05/29/19 177 lb 3.2 oz (80.4 kg)     Objective:  Vital Signs:  BP 126/83   Pulse 86   Ht 5\' 5"  (1.651 m)   Wt 177 lb (80.3 kg)   LMP 04/17/2014 Comment: hysterectomy w/ single oophorectomy//a.c.  BMI 29.45 kg/m   VITAL SIGNS:  reviewed GEN:  no acute distress RESPIRATORY:  non-labored NEURO:  alert and oriented x 3, no obvious focal deficit PSYCH:  normal affect   ASSESSMENT & PLAN:    Problem List Items Addressed This Visit    Hypertension - Primary (Chronic)   Hyperlipidemia (Chronic)   Chest pain with low risk for cardiac etiology      Jessia is doing very well.  Her blood pressures well controlled.  She would like to come off clonidine, but I agree with her that with her blood pressure being well controlled have having added amlodipine, it may be easiest to simply continue clonidine for now as it is difficult to wean.  Her lipids are being monitored by her PCP.  Not currently on meds.  The chest discomfort she was having is probably related to a GI issue.  Better with Protonix.  Had a essentially normal coronary CT angiogram.  Therefore very unlikely to be cardiac in nature.  She  is quite stable I think she is fine for following up as needed.  COVID-19 Education: The signs and symptoms of COVID-19 were discussed with the patient and how to seek care for testing (follow up with PCP or arrange E-visit).   The importance of social distancing was discussed today.  Time:   Today, I have spent 10 minutes with the patient with telehealth technology discussing the above problems.     Medication Adjustments/Labs and Tests Ordered: Current medicines are reviewed at length with the patient today.  Concerns regarding medicines are outlined above.  Medication Instructions: none  Tests Ordered: No orders of the defined types were placed in this encounter. none  Medication Changes: No orders of the defined types were placed in this encounter.   Disposition:  Follow up prn    Signed, Glenetta Hew, MD  06/18/2019 9:36 AM    [place]

## 2019-06-19 ENCOUNTER — Encounter: Payer: Self-pay | Admitting: Family Medicine

## 2019-06-19 NOTE — Assessment & Plan Note (Signed)
Blood pressure much improved, no SE with norvasc Continue to monitor

## 2019-06-20 ENCOUNTER — Encounter: Payer: Self-pay | Admitting: Cardiology

## 2019-06-22 ENCOUNTER — Encounter (HOSPITAL_COMMUNITY): Payer: Self-pay

## 2019-06-22 ENCOUNTER — Encounter (HOSPITAL_COMMUNITY): Payer: 59

## 2019-06-23 ENCOUNTER — Telehealth: Payer: Self-pay | Admitting: *Deleted

## 2019-06-23 ENCOUNTER — Other Ambulatory Visit: Payer: Self-pay

## 2019-06-23 ENCOUNTER — Ambulatory Visit: Payer: 59 | Admitting: Obstetrics and Gynecology

## 2019-06-23 ENCOUNTER — Encounter: Payer: Self-pay | Admitting: Obstetrics and Gynecology

## 2019-06-23 VITALS — BP 138/80 | HR 80 | Temp 98.2°F | Resp 12 | Ht 65.0 in | Wt 174.0 lb

## 2019-06-23 DIAGNOSIS — M545 Low back pain, unspecified: Secondary | ICD-10-CM

## 2019-06-23 DIAGNOSIS — Z01419 Encounter for gynecological examination (general) (routine) without abnormal findings: Secondary | ICD-10-CM

## 2019-06-23 LAB — POCT URINALYSIS DIPSTICK
Bilirubin, UA: NEGATIVE
Glucose, UA: NEGATIVE
Ketones, UA: NEGATIVE
Nitrite, UA: NEGATIVE
Protein, UA: NEGATIVE
Urobilinogen, UA: 0.2 E.U./dL
pH, UA: 5 (ref 5.0–8.0)

## 2019-06-23 MED ORDER — VALACYCLOVIR HCL 500 MG PO TABS: 500.0000 mg | ORAL_TABLET | Freq: Two times a day (BID) | ORAL | 2 refills | Status: DC

## 2019-06-23 NOTE — Progress Notes (Signed)
43 y.o. [id number] Divorced [place] American female here for annual exam.    25 pound weight loss.  States she thinks it is related to anxiety.  She also has decreased appetite.  Was taking medication for restless leg syndrome, and she stopped it.  She states she has long standing anxiety and depression. She has good support system in her boyfriend.  She does not want to return to counseling.  She does meditation and goes on walks when needed.   States she has some low back pain.  She was treated for a UTI by her PCP in May, 2020.  No dysuria.  She has some urgency at night.  No daytime urinary urgency.  No urinary incontinence.   No caffeine or ETOH use.   She has a stomach emptying test tomorrow.   No partner change.  Denies STD testing.   PCP: Modena Nunnery. [place], MD    Patient's last menstrual period was 04/17/2014.           Sexually active: Yes.    The current method of family planning is status post hysterectomy.    Exercising: No.  The patient does not participate in regular exercise at present. Smoker:  no  Health Maintenance: Pap:  01-21-14 negative, HR HPV negative  History of abnormal Pap:  Yes, hx of dysplasia and cryotherapy to cervix in 2000 MMG:  04/21/18 Bilateral Diagnostic MMG/US -- BIRADS 1 negative/density b TDaP:  2013 Gardasil:   no HIV and Hep C: negative in 2017 Screening Labs:  PCP   reports that she has never smoked. She has never used smokeless tobacco. She reports previous alcohol use. She reports that she does not use drugs.  Past Medical History:  Diagnosis Date  . Allergy   . Angio-edema   . Anxiety   . Asthma    no symptoms in "years", no inhaler  . Depression   . Herpes simplex   . Hiatal hernia   . Hyperlipidemia due to dietary fat intake   . Hyperlysinemia ([place])   . Hypertension   . Sickle cell trait ([place])   . Vitamin D deficiency     Past Surgical History:  Procedure Laterality Date  . ABDOMINAL HYSTERECTOMY N/A 05/04/2014   Procedure: HYSTERECTOMY ABDOMINAL with BILATERAL SALPINGECTOMY ;  Surgeon: Everardo All Amundson de Berton Lan, MD;  Location: [facility];  Service: Gynecology;  Laterality: N/A;  . BREAST BIOPSY Right Jan. 2016   benign fibroadenoma  . FOOT SURGERY    . LAPAROTOMY N/A 05/04/2014   Procedure: REMOVAL OF ABD WALL MASS ;  Surgeon: Adin Hector, MD;  Location: [facility];  Service: General;  Laterality: N/A;  . LIPOMA EXCISION N/A 05/04/2014   Procedure: EXCISION LIPOMA of abdominal wall;  Surgeon: Jamey Reas de Berton Lan, MD;  Location: [facility];  Service: Gynecology;  Laterality: N/A;  . NASAL SINUS SURGERY  2019  . PELVIC LAPAROSCOPY Right 2018   oophorectomy  . TUBAL LIGATION      Current Outpatient Medications  Medication Sig Dispense Refill  . acetaminophen (TYLENOL) 500 MG tablet Take 1,000 mg by mouth every 6 (six) hours as needed (for pain or headaches).    Marland Kitchen albuterol (PROVENTIL HFA;VENTOLIN HFA) 108 (90 Base) MCG/ACT inhaler Inhale 2 puffs into the lungs every 6 (six) hours as needed for wheezing or shortness of breath. 1 Inhaler 2  . amLODipine (NORVASC) 5 MG tablet Take 1 tablet (5 mg total) by mouth daily. 30 tablet  3  . cloNIDine (CATAPRES) 0.1 MG tablet Take 1 tablet (0.1 mg total) by mouth 2 (two) times daily. 180 tablet 0  . EPINEPHrine (EPIPEN 2-PAK) 0.3 mg/0.3 mL IJ SOAJ injection Inject 0.3 mg into the muscle once as needed (for an anaphylactic reaction).    . fluticasone (FLONASE) 50 MCG/ACT nasal spray Place 2 sprays into both nostrils daily as needed for allergies or rhinitis. 16 g 2  . levocetirizine (XYZAL) 5 MG tablet Take 1 tablet (5 mg total) by mouth every evening. 90 tablet 1  . montelukast (SINGULAIR) 10 MG tablet Take 1 tablet (10 mg total) by mouth at bedtime. 30 tablet 3  . Multiple Vitamin (MULTIVITAMIN) tablet Take 1 tablet by mouth daily.    . pantoprazole (PROTONIX) 40 MG tablet Take 1 tablet (40 mg total) by mouth 2 (two) times daily before a meal.  180 tablet 0  . trimethoprim-polymyxin b (POLYTRIM) ophthalmic solution 1 drop to affected eye four times a day for 5 days 10 mL 0   No current facility-administered medications for this visit.     Family History  Problem Relation Age of Onset  . Diabetes Mother   . Thyroid disease Mother   . Colon polyps Mother   . Stroke Father   . Mental retardation Maternal Aunt   . Diabetes Maternal Grandmother   . Colon cancer Neg Hx   . Esophageal cancer Neg Hx   . Stomach cancer Neg Hx   . Rectal cancer Neg Hx     Review of Systems  Constitutional: Negative.   HENT: Negative.   Eyes: Negative.   Respiratory: Negative.   Cardiovascular: Negative.   Gastrointestinal: Negative.   Endocrine: Negative.   Genitourinary: Negative.   Musculoskeletal: Negative.   Skin: Negative.   Allergic/Immunologic: Negative.   Neurological: Negative.   Hematological: Negative.   Psychiatric/Behavioral: Negative.     Exam:   Temp 98.2 F (36.8 C) (Temporal)   Ht 5\' 5"  (1.651 m)   Wt 174 lb (78.9 kg)   LMP 04/17/2014 Comment: hysterectomy w/ single oophorectomy//a.c.  BMI 28.96 kg/m     General appearance: alert, cooperative and appears stated age Head: normocephalic, without obvious abnormality, atraumatic Neck: no adenopathy, supple, symmetrical, trachea midline and thyroid normal to inspection and palpation Lungs: clear to auscultation bilaterally Breasts: normal appearance, no masses or tenderness, No nipple retraction or dimpling, No nipple discharge or bleeding, No axillary adenopathy Heart: regular rate and rhythm Abdomen: soft, non-tender; no masses, no organomegaly Extremities: extremities normal, atraumatic, no cyanosis or edema Skin: skin color, texture, turgor normal. No rashes or lesions Lymph nodes: cervical, supraclavicular, and axillary nodes normal. Neurologic: grossly normal  Pelvic: External genitalia:  no lesions              No abnormal inguinal nodes palpated.               Urethra:  normal appearing urethra with no masses, tenderness or lesions              Bartholins and Skenes: normal                 Vagina: normal appearing vagina with normal color and discharge, no lesions              Cervix: absent.               Pap taken: No. Bimanual Exam:  Uterus: absent.  Adnexa: no mass, fullness, tenderness              Rectal exam: Yes.  .  Confirms.              Anus:  normal sphincter tone, no lesions  Chaperone was present for exam.  Assessment:   Well woman visit with normal exam. Status post TAH/bilateral salpingectomy/excision of abdominal wall lipoma. Statu post laparoscopic right oophorectomy/ecisionof epiploic mass. Left ovary remains.  Hx HSV.  Genital.  Back pain.  Recent UTI.   Plan: Mammogram screening discussed. Self breast awareness reviewed. Pap and HR HPV as above. Guidelines for Calcium, Vitamin D, regular exercise program including cardiovascular and weight bearing exercise. Valtrex Rx.  Urine dip.  Will send for micro and culture if abnormal. Follow up annually and prn.   After visit summary provided.

## 2019-06-23 NOTE — Telephone Encounter (Signed)
Received call from patient.   Reports that she was seen last week for conjunctivitis. States that she now has post nasal drip, sinus pressure, and non-productive cough x4 days. Denies fever.   Requested ABTx for sinus infection.   MD please advise.

## 2019-06-23 NOTE — Patient Instructions (Signed)

## 2019-06-23 NOTE — Addendum Note (Signed)
Addended by: Terence Lux A on: 06/23/2019 08:54 AM   Modules accepted: Orders

## 2019-06-24 ENCOUNTER — Encounter (HOSPITAL_COMMUNITY)
Admission: RE | Admit: 2019-06-24 | Discharge: 2019-06-24 | Disposition: A | Discharge status: Home / Self Care | Payer: 59 | Source: Ambulatory Visit | Attending: Surgery | Admitting: Surgery

## 2019-06-24 ENCOUNTER — Other Ambulatory Visit: Payer: Self-pay | Admitting: Family Medicine

## 2019-06-24 ENCOUNTER — Emergency Department (HOSPITAL_BASED_OUTPATIENT_CLINIC_OR_DEPARTMENT_OTHER)
Admission: EM | Admit: 2019-06-24 | Discharge: 2019-06-24 | Disposition: A | Discharge status: Home / Self Care | Payer: 59 | Attending: Emergency Medicine | Admitting: Emergency Medicine

## 2019-06-24 ENCOUNTER — Other Ambulatory Visit: Payer: Self-pay

## 2019-06-24 ENCOUNTER — Encounter (HOSPITAL_BASED_OUTPATIENT_CLINIC_OR_DEPARTMENT_OTHER): Payer: Self-pay | Admitting: *Deleted

## 2019-06-24 DIAGNOSIS — Z79899 Other long term (current) drug therapy: Secondary | ICD-10-CM | POA: Insufficient documentation

## 2019-06-24 DIAGNOSIS — I10 Essential (primary) hypertension: Secondary | ICD-10-CM | POA: Insufficient documentation

## 2019-06-24 DIAGNOSIS — R51 Headache: Secondary | ICD-10-CM | POA: Diagnosis not present

## 2019-06-24 DIAGNOSIS — R0981 Nasal congestion: Secondary | ICD-10-CM | POA: Insufficient documentation

## 2019-06-24 DIAGNOSIS — R030 Elevated blood-pressure reading, without diagnosis of hypertension: Secondary | ICD-10-CM

## 2019-06-24 DIAGNOSIS — J4520 Mild intermittent asthma, uncomplicated: Secondary | ICD-10-CM

## 2019-06-24 DIAGNOSIS — R109 Unspecified abdominal pain: Secondary | ICD-10-CM

## 2019-06-24 DIAGNOSIS — R519 Headache, unspecified: Secondary | ICD-10-CM

## 2019-06-24 LAB — URINALYSIS, MICROSCOPIC ONLY
Casts: NONE SEEN /lpf
Epithelial Cells (non renal): >10 /hpf — AB (ref 0–10)

## 2019-06-24 MED ORDER — AMOXICILLIN-POT CLAVULANATE 875-125 MG PO TABS: 1.0000 | ORAL_TABLET | Freq: Two times a day (BID) | ORAL | 0 refills | Status: AC

## 2019-06-24 MED ORDER — TECHNETIUM TC 99M SULFUR COLLOID
2.0000 | Freq: Once | INTRAVENOUS | Status: AC | PRN
  Administered 2019-06-24: 2 via ORAL

## 2019-06-24 MED ORDER — AMOXICILLIN-POT CLAVULANATE 875-125 MG PO TABS
1.0000 | ORAL_TABLET | Freq: Once | ORAL | Status: AC
  Administered 2019-06-24: 1 via ORAL
  Filled 2019-06-24: qty 1

## 2019-06-24 NOTE — ED Triage Notes (Signed)
C/o sinus pressure x 2 days

## 2019-06-24 NOTE — Discharge Instructions (Signed)
Please see the information and instructions below regarding your visit.  Your diagnoses today include:  1. Facial pain   2. Elevated blood pressure reading     Tests performed today include: See side panel of your discharge paperwork for testing performed today. Vital signs are listed at the bottom of these instructions.   Medications prescribed:    Take any prescribed medications only as prescribed, and any over the counter medications only as directed on the packaging.  Please take all of your antibiotics until finished.   You may develop abdominal discomfort or nausea from the antibiotic. If this occurs, you may take it with food. Some patients also get diarrhea with antibiotics. You may help offset this with probiotics which you can buy or get in yogurt. Do not eat or take the probiotics until 2 hours after your antibiotic. Some women develop vaginal yeast infections after antibiotics. If you develop unusual vaginal discharge after being on this medication, please see your primary care provider.   Some people develop allergies to antibiotics. Symptoms of antibiotic allergy can be mild and include a flat rash and itching. They can also be more serious and include:  ?Hives - Hives are raised, red patches of skin that are usually very itchy.  ?Lip or tongue swelling  ?Trouble swallowing or breathing  ?Blistering of the skin or mouth.  If you have any of these serious symptoms, please seek emergency medical care immediately.  Home care instructions:  Please follow any educational materials contained in this packet.   Follow-up instructions: Please follow-up with your primary care provider as needed for further evaluation of your symptoms if they are not completely improved.    Return instructions:  Please return to the Emergency Department if you experience worsening symptoms.  Please return for any fevers, chills, worsening headaches or vision changes. Please return if you have  any other emergent concerns.  Additional Information:   Your vital signs today were: BP (!) 168/118 (BP Location: Right Arm)    Pulse 78    Temp 98.1 F (36.7 C)    Resp 18    Ht 5\' 5"  (1.651 m)    Wt 78.5 kg    LMP 04/17/2014 Comment: hysterectomy w/ single oophorectomy//a.c.   SpO2 100%    BMI 28.79 kg/m  If your blood pressure (BP) was elevated on multiple readings during this visit above 130 for the top number or above 80 for the bottom number, please have this repeated by your primary care provider within one month. --------------  Thank you for allowing [place] to participate in your care today.

## 2019-06-24 NOTE — ED Provider Notes (Signed)
[facility] EMERGENCY DEPARTMENT Provider Note   CSN: [id number] Arrival date & time: 06/24/19  1812     History   Chief Complaint Chief Complaint  Patient presents with  . Facial Pain    HPI Mary Robles is a 43 y.o. female.     HPI  Patient is a 43 year old female with a past medical history of asthma, hypertension, hyperlipidemia, allergic rhinitis presenting for facial pain, pressure, and congestion.  Patient reports that she has increasing facial pain and worsening pressure of her frontal sinuses over the past 7 to 8 days.  She was initially treated by her primary care provider for conjunctivitis after she had some purulent drainage from her eyes.  This resolved with Polytrim drops.  Patient denies any fevers, visual disturbance.  She denies any earaches or dental pain.  She reports a long history of sinusitis and had deviated nasal septum repair per ENT.  Patient reports taking Flonase and her over-the-counter allergy medication.  Past Medical History:  Diagnosis Date  . Allergy   . Angio-edema   . Anxiety   . Asthma    no symptoms in "years", no inhaler  . Depression   . Herpes simplex   . Hiatal hernia   . Hyperlipidemia due to dietary fat intake   . Hyperlysinemia ([place])   . Hypertension   . Sickle cell trait ([place])   . Vitamin D deficiency     Patient Active Problem List   Diagnosis Date Noted  . Chest pain with low risk for cardiac etiology 09/17/2018  . DOE (dyspnea on exertion) 09/17/2018  . Obesity (BMI 30.0-34.9) 07/02/2017  . MDD (major depressive disorder) 07/16/2016  . Asthma, mild 04/11/2015  . GAD (generalized anxiety disorder) 02/08/2015  . Hyperlipidemia 07/09/2014  . Insomnia 07/09/2014  . Lipoma of abdominal wall s/p excision 05/04/2014 05/19/2014  . Status post total abdominal hysterectomy 05/04/2014  . Hypertension   . Vitamin D deficiency     Past Surgical History:  Procedure Laterality Date  . ABDOMINAL HYSTERECTOMY N/A  05/04/2014   Procedure: HYSTERECTOMY ABDOMINAL with BILATERAL SALPINGECTOMY ;  Surgeon: Everardo All Amundson de Berton Lan, MD;  Location: [facility];  Service: Gynecology;  Laterality: N/A;  . BREAST BIOPSY Right Jan. 2016   benign fibroadenoma  . FOOT SURGERY    . LAPAROTOMY N/A 05/04/2014   Procedure: REMOVAL OF ABD WALL MASS ;  Surgeon: Adin Hector, MD;  Location: [facility];  Service: General;  Laterality: N/A;  . LIPOMA EXCISION N/A 05/04/2014   Procedure: EXCISION LIPOMA of abdominal wall;  Surgeon: Jamey Reas de Berton Lan, MD;  Location: [facility];  Service: Gynecology;  Laterality: N/A;  . NASAL SINUS SURGERY  2019  . PELVIC LAPAROSCOPY Right 2018   oophorectomy  . TUBAL LIGATION       OB History    Gravida  4   Para  2   Term      Preterm      AB  1   Living  2     SAB      TAB  1   Ectopic      Multiple      Live Births               Home Medications    Prior to Admission medications   Medication Sig Start Date End Date Taking? Authorizing Provider  acetaminophen (TYLENOL) 500 MG tablet Take 1,000 mg by mouth every 6 (  six) hours as needed (for pain or headaches).    [provider]  albuterol (PROVENTIL HFA;VENTOLIN HFA) 108 (90 Base) MCG/ACT inhaler Inhale 2 puffs into the lungs every 6 (six) hours as needed for wheezing or shortness of breath. 01/09/19   [place], Modena Nunnery, MD  amLODipine (NORVASC) 5 MG tablet Take 1 tablet (5 mg total) by mouth daily. 05/29/19   Alycia Rossetti, MD  amoxicillin-clavulanate (AUGMENTIN) 875-125 MG tablet Take 1 tablet by mouth every 12 (twelve) hours for 7 days. 06/24/19 07/01/19  Langston Masker B, PA-C  cloNIDine (CATAPRES) 0.1 MG tablet Take 1 tablet (0.1 mg total) by mouth 2 (two) times daily. 04/06/19   [place]isle, Modena Nunnery, MD  EPINEPHrine (EPIPEN 2-PAK) 0.3 mg/0.3 mL IJ SOAJ injection Inject 0.3 mg into the muscle once as needed (for an anaphylactic reaction).    [provider]  fluticasone  (FLONASE) 50 MCG/ACT nasal spray Place 2 sprays into both nostrils daily as needed for allergies or rhinitis. 01/09/19   [place], Modena Nunnery, MD  levocetirizine (XYZAL) 5 MG tablet Take 1 tablet (5 mg total) by mouth every evening. 03/04/19   Delsa Grana, PA-C  montelukast (SINGULAIR) 10 MG tablet TAKE 1 TABLET BY MOUTH AT BEDTIME 06/24/19   Delsa Grana, PA-C  Multiple Vitamin (MULTIVITAMIN) tablet Take 1 tablet by mouth daily.    [provider]  pantoprazole (PROTONIX) 40 MG tablet Take 1 tablet (40 mg total) by mouth 2 (two) times daily before a meal. 04/22/19   Thornton Park, MD  trimethoprim-polymyxin b (POLYTRIM) ophthalmic solution 1 drop to affected eye four times a day for 5 days 06/18/19   Alycia Rossetti, MD  valACYclovir (VALTREX) 500 MG tablet Take 1 tablet (500 mg total) by mouth 2 (two) times daily. Take for 3 days as needed. 06/23/19   Nunzio Cobbs, MD    Family History Family History  Problem Relation Age of Onset  . Diabetes Mother   . Thyroid disease Mother   . Colon polyps Mother   . Stroke Father   . Mental retardation Maternal Aunt   . Diabetes Maternal Grandmother   . Colon cancer Neg Hx   . Esophageal cancer Neg Hx   . Stomach cancer Neg Hx   . Rectal cancer Neg Hx     Social History Social History   Tobacco Use  . Smoking status: Never Smoker  . Smokeless tobacco: Never Used  Substance Use Topics  . Alcohol use: Not Currently    Alcohol/week: 0.0 standard drinks  . Drug use: No     Allergies   Vicodin [hydrocodone-acetaminophen], Aldactone [spironolactone], Benazepril, Betadine [povidone iodine], Other, and Shellfish allergy   Review of Systems Review of Systems  Constitutional: Negative for chills and fever.  HENT: Positive for congestion, rhinorrhea, sinus pressure and sinus pain. Negative for facial swelling.   Respiratory: Negative for cough and shortness of breath.      Physical Exam Updated Vital Signs BP (!) 168/118  (BP Location: Right Arm)   Pulse 78   Temp 98.1 F (36.7 C)   Resp 18   Ht 5\' 5"  (1.651 m)   Wt 78.5 kg   LMP 04/17/2014 Comment: hysterectomy w/ single oophorectomy//a.c.  SpO2 100%   BMI 28.79 kg/m   Physical Exam Vitals signs and nursing note reviewed.  Constitutional:      General: She is not in acute distress.    Appearance: She is well-developed. She is not diaphoretic.  Comments: Sitting comfortably in bed.  HENT:     Head: Normocephalic and atraumatic.     Comments: Patient has facial pain to palpation of frontal and maxillary sinuses.    Right Ear: Tympanic membrane normal.     Left Ear: Tympanic membrane normal.     Nose: Congestion present.     Comments: Mild erythema of nasal turbinates.     Mouth/Throat:     Mouth: Mucous membranes are moist.  Eyes:     General:        Right eye: No discharge.        Left eye: No discharge.     Conjunctiva/sclera: Conjunctivae normal.     Comments: EOMs normal to gross examination.  Neck:     Musculoskeletal: Normal range of motion.  Cardiovascular:     Rate and Rhythm: Normal rate and regular rhythm.     Heart sounds: Normal heart sounds. No murmur.  Pulmonary:     Effort: Pulmonary effort is normal.     Breath sounds: Normal breath sounds. No wheezing or rales.  Abdominal:     General: There is no distension.  Musculoskeletal: Normal range of motion.  Skin:    General: Skin is warm and dry.  Neurological:     Mental Status: She is alert.     Comments: Cranial nerves intact to gross observation. Patient moves extremities without difficulty.  Psychiatric:        Behavior: Behavior normal.        Thought Content: Thought content normal.        Judgment: Judgment normal.      ED Treatments / Results  Labs (all labs ordered are listed, but only abnormal results are displayed) Labs Reviewed - No data to display  EKG None  Radiology Nm Gastric Emptying  Result Date: 06/24/2019 CLINICAL DATA:  Abdominal  pain and nausea for approximately 3 months. Early satiety. EXAM: NUCLEAR MEDICINE GASTRIC EMPTYING SCAN TECHNIQUE: After oral ingestion of radiolabeled meal, sequential abdominal images were obtained for 4 hours. Percentage of activity emptying the stomach was calculated at 1 hour, 2 hour, 3 hour, and 4 hours. RADIOPHARMACEUTICALS:  2.1 mCi Tc-103m sulfur colloid in standardized meal COMPARISON:  None. FINDINGS: Expected location of the stomach in the left upper quadrant. Ingested meal empties the stomach gradually over the course of the study. 21% emptied at 1 hr ( normal >= 10%) 30% emptied at 2 hr ( normal >= 40%) 81% emptied at 3 hr ( normal >= 70%) 96% emptied at 4 hr ( normal >= 90%) IMPRESSION: Normal gastric emptying study. Electronically Signed   By: Marlaine Hind M.D.   On: 06/24/2019 12:02    Procedures Procedures (including critical care time)  Medications Ordered in ED Medications  amoxicillin-clavulanate (AUGMENTIN) 875-125 MG per tablet 1 tablet (has no administration in time range)     Initial Impression / Assessment and Plan / ED Course  I have reviewed the triage vital signs and the nursing notes.  Pertinent labs & imaging results that were available during my care of the patient were reviewed by me and considered in my medical decision making (see chart for details).        This is a well-appearing 43 year old female with past medical history of hypertension, hyperlipidemia, asthma, allergic rhinitis presenting for facial pain and pressure.  This is in the setting of recent conjunctivitis.  She has a long history of sinusitis.  No history of immune compromise status or diabetes.  No fever or chills.  Given prolonged course of symptoms consistent with sinusitis, will treat with antibiotics.  Patient given Augmentin.  She is given return precautions for any fever or chills with her symptoms, visual disturbance, or new or worsening symptoms.  Incidentally, blood pressure elevated  today.  Patient did take her antihypertensives today.  Patient reports that her blood pressure is usually elevated when she is ill.  She is denying any headaches, visual disturbance, chest pain or shortness of breath or other signs or symptoms of hypertensive urgency or emergency.  Improved to 168/118 after closure emergency department course.  Final Clinical Impressions(s) / ED Diagnoses   Final diagnoses:  Facial pain  Elevated blood pressure reading    ED Discharge Orders         Ordered    amoxicillin-clavulanate (AUGMENTIN) 875-125 MG tablet  Every 12 hours     06/24/19 1857           Tamala Julian 06/24/19 1903    Lennice Sites, DO 06/24/19 1945

## 2019-06-24 NOTE — Telephone Encounter (Signed)
Patient returned call and made aware.   Appointment scheduled.   

## 2019-06-24 NOTE — Telephone Encounter (Signed)
Call placed to patient. LMTRC.  

## 2019-06-24 NOTE — Telephone Encounter (Signed)
Needs phone visit - can try and arrange with [place] or do evisit.  May also need COVID testing and I will not rx abx without some kind of visit since I didn't see her last week.  thanks

## 2019-06-25 LAB — URINE CULTURE

## 2019-06-26 ENCOUNTER — Encounter: Payer: Self-pay | Admitting: Family Medicine

## 2019-06-26 ENCOUNTER — Ambulatory Visit (INDEPENDENT_AMBULATORY_CARE_PROVIDER_SITE_OTHER): Payer: 59 | Admitting: Family Medicine

## 2019-06-26 ENCOUNTER — Other Ambulatory Visit: Payer: Self-pay

## 2019-06-26 DIAGNOSIS — B3731 Acute candidiasis of vulva and vagina: Secondary | ICD-10-CM

## 2019-06-26 DIAGNOSIS — B373 Candidiasis of vulva and vagina: Secondary | ICD-10-CM

## 2019-06-26 DIAGNOSIS — J329 Chronic sinusitis, unspecified: Secondary | ICD-10-CM

## 2019-06-26 MED ORDER — FLUCONAZOLE 150 MG PO TABS: 150.0000 mg | ORAL_TABLET | ORAL | 0 refills | Status: DC | PRN

## 2019-06-26 NOTE — Progress Notes (Signed)
    Patient ID: Mary Robles, female    DOB: 05/18/1976, 42 y.o.   MRN: [id number]  PCP: Alycia Rossetti, MD  Virtual Visit via telephone  Phone visit arranged with Shirl Harris Kallstrom for 06/26/19 at  4:00 PM EDT  Services provided today were via telemedicine through telephone call. Start of phone call:  4:10 PM  Pt went to the ER two days ago when unable to be seen in clinic, she has started abx, asks if we can call her in diflucan for yeast infection sx.  Does not need telephone visit today.  Encouraged to f/up if not improving, considered changing allergy meds between the 2nd gen antihistamines, dosing BID, adding pepcid or following up with ENT or allergy specialist again.  4 sinus infections in the past years since sinus surgery.  She is already feeling better with augmentin.  Phone call concluded at 4:16 PM  No Charge - declined phone visit today, and recent encounter w/in 7 days for same complaint means [place] for virtual visit today.  Delsa Grana, PA-C 06/26/19 4:10 PM

## 2019-06-30 NOTE — Progress Notes (Signed)
TELEHEALTH VISIT  Referring Provider: Alycia Rossetti, MD Primary Care Physician:  Alycia Rossetti, MD   Tele-visit due to COVID-19 pandemic Patient requested visit virtually, consented to the virtual encounter via video enabled telemedicine application (Zoom) Contact made at: 11:30 07/02/19 Patient verified by name and date of birth Location of patient: Home Location provider: [facility] Names of persons participating: Me, patient, [name] CMA Time spent on telehealth visit: 29 minutes I discussed the limitations of evaluation and management by telemedicine. The patient expressed understanding and agreed to proceed.  Chief complaint:  GERD, chronic nausea, hiatal hernia   IMPRESSION:  Post-prandial abdominal pain with nausea and bloating    - no source identified on EGD, CT scan, ultrasound, HIDA scan, or gastric emptying scan    - abdominal pain with oral Ensure consumption on HIDA    - EGD 06/05/19: no source, normal duodenal biopsies, mild gastropathy without H pylori Globus, unexplained by recent EGD or ENT evaluation    - reports normal ENT evaluation earlier this year    - EGD 06/05/19: mildly tortuous esophagus without reflux, H pylori, or gastric inlet patch Long-standing history of GERD improved with PPI therapy Small to moderate sized hiatal hernia  Unintentional 12 pound weight loss, weight now stabilizing No known family history of colon cancer  Family history of colon polyps (mother)  Etiology of abdominal pain, nausea, and bloating not identified on EGD, CT scan, ultrasound, HIDA scan, or gastric emptying study. The patient did experience abdominal pain with the oral Ensure consumption.  Consultation with Dr. Johney Maine recommended additional evaluation prior to reconsidering cholecystectomy.  Differential continues to include functional dyspepsia, reflux, food allergies, bacterial overgrowth, IBS, and symptomatic gallbladder disease.  Now  reporting globus without associated pain, lateralization of the symptoms, dysphagia, odynophagia, weight loss, a change in voice, presence of a neck or tonsillar mass, or unexplained cervical adenopathy. Recent EGD excludes gastroesophageal reflux or gastric inlet pouch in the proximal esophagus.  Will proceed with esophageal manometry, esophageal impedance, and pH testing to exclude esophageal motility disorders.  Trial of daily PPI therapy. Consider amitriptyline 25 mg daily if no response to PPI. An EKG in 04/2019 showed a prolonged QTc. However, it appears that QTc was normal on all prior ECGs. As the patient was hypokalemic at that time, will review with Dr. Buelah Manis to see if she is a candidate for amitriptyline.  PLAN: 24 pH probe and high resolution manometry Barium esophagram Continue pantoprazole 40 mg daily I will contact Dr. Buelah Manis re: candidacy for amitryiptyline given her ECG history Trial of Bentyl 20 mg daily if she is unable to take amitryptyline  Follow-up after these results are available  HPI: Mary Robles is a 43 y.o. female under evaluation of chronic nausea and epigastric pain.  The interval history is obtained through the patient, review of her electronic health record, and consultation records from Dr. Johney Maine (surgery).   Endoscopy 06/05/19: showed a mildly tortuous esophagus, mild gastritis, and a medium sized hiatal hernia, and normal duodenum.  Duodenal and gastric biopsies were essentially normal.  She was requesting surgical consultation to evaluate for symptomatic gallbladder disease.  I reviewed the records from her consultation with Dr. Johney Maine who was reluctant to do cholecystectomy.  He recommended additional evaluation of her symptoms.  He arranged for a gastric emptying scan 06/24/2019 that was normal.  Reflux. Ongoing nausea. Constant sense of globus with need for frequent throat clearing of mucous that she can't clear.  Ongoing metallic taste. Bloating with abdominal  distension that worsens as soon as she finishes eating. Appetite fluctuates.  Trying to avoid greasy foods without change in her symptoms. These foods trigger her symptoms. 169 pounds. Fluctuates between 169-172.  No nocturnal symptoms.  Intermittent heartburn. Feels left sided pressure as her manifestation of heartburn. No postprandial defecation. No change in pain with defecation.    Continues to take pantoprazole 40 mg BID. She wants to get rid of the mucous in the her throat.   Saw her ENT two months ago and was told every was clear.   She wondered if anxiety was contributing.   Recent imaging: Abdominal ultrasound 05/03/19: diffuse hepatic steatosis, otherwise normal exam CT abd/pelvis with contrast 05/03/19: small hiatal hernia, no source of pain identified HIDA 05/21/19: normal GB EF; some bloating and cramping with Ensure consumption GES 06/24/19: normal  Past Medical History:  Diagnosis Date  . Allergy   . Angio-edema   . Anxiety   . Asthma    no symptoms in "years", no inhaler  . Depression   . Herpes simplex   . Hiatal hernia   . Hyperlipidemia due to dietary fat intake   . Hyperlysinemia ([place])   . Hypertension   . Sickle cell trait ([place])   . Vitamin D deficiency     Past Surgical History:  Procedure Laterality Date  . ABDOMINAL HYSTERECTOMY N/A 05/04/2014   Procedure: HYSTERECTOMY ABDOMINAL with BILATERAL SALPINGECTOMY ;  Surgeon: Everardo All Amundson de Berton Lan, MD;  Location: [facility];  Service: Gynecology;  Laterality: N/A;  . BREAST BIOPSY Right Jan. 2016   benign fibroadenoma  . FOOT SURGERY    . LAPAROTOMY N/A 05/04/2014   Procedure: REMOVAL OF ABD WALL MASS ;  Surgeon: Adin Hector, MD;  Location: [facility];  Service: General;  Laterality: N/A;  . LIPOMA EXCISION N/A 05/04/2014   Procedure: EXCISION LIPOMA of abdominal wall;  Surgeon: Jamey Reas de Berton Lan, MD;  Location: [facility];  Service: Gynecology;  Laterality: N/A;  . NASAL SINUS SURGERY  2019   . PELVIC LAPAROSCOPY Right 2018   oophorectomy  . TUBAL LIGATION      Current Outpatient Medications  Medication Sig Dispense Refill  . acetaminophen (TYLENOL) 500 MG tablet Take 1,000 mg by mouth every 6 (six) hours as needed (for pain or headaches).    Marland Kitchen albuterol (PROVENTIL HFA;VENTOLIN HFA) 108 (90 Base) MCG/ACT inhaler Inhale 2 puffs into the lungs every 6 (six) hours as needed for wheezing or shortness of breath. 1 Inhaler 2  . amLODipine (NORVASC) 5 MG tablet Take 1 tablet (5 mg total) by mouth daily. 30 tablet 3  . amoxicillin-clavulanate (AUGMENTIN) 875-125 MG tablet Take 1 tablet by mouth every 12 (twelve) hours for 7 days. 13 tablet 0  . cloNIDine (CATAPRES) 0.1 MG tablet Take 1 tablet (0.1 mg total) by mouth 2 (two) times daily. 180 tablet 0  . EPINEPHrine (EPIPEN 2-PAK) 0.3 mg/0.3 mL IJ SOAJ injection Inject 0.3 mg into the muscle once as needed (for an anaphylactic reaction).    . fluconazole (DIFLUCAN) 150 MG tablet Take 1 tablet (150 mg total) by mouth every 3 (three) days as needed (for vaginal itching/yeast infection sx). 2 tablet 0  . fluticasone (FLONASE) 50 MCG/ACT nasal spray Place 2 sprays into both nostrils daily as needed for allergies or rhinitis. 16 g 2  . levocetirizine (XYZAL) 5 MG tablet Take 1 tablet (5 mg total) by mouth every evening.  90 tablet 1  . montelukast (SINGULAIR) 10 MG tablet TAKE 1 TABLET BY MOUTH AT BEDTIME 30 tablet 0  . Multiple Vitamin (MULTIVITAMIN) tablet Take 1 tablet by mouth daily.    . pantoprazole (PROTONIX) 40 MG tablet Take 1 tablet (40 mg total) by mouth 2 (two) times daily before a meal. 180 tablet 0  . trimethoprim-polymyxin b (POLYTRIM) ophthalmic solution 1 drop to affected eye four times a day for 5 days 10 mL 0  . valACYclovir (VALTREX) 500 MG tablet Take 1 tablet (500 mg total) by mouth 2 (two) times daily. Take for 3 days as needed. 30 tablet 2   No current facility-administered medications for this visit.     Allergies as  of 07/02/2019 - Review Complete 06/26/2019  Allergen Reaction Noted  . Vicodin [hydrocodone-acetaminophen] Other (See Comments) 05/04/2014  . Aldactone [spironolactone]  05/29/2019  . Benazepril Swelling 02/21/2018  . Betadine [povidone iodine] Other (See Comments) 07/12/2011  . Other Hives, Itching, and Other (See Comments) 07/07/2018  . Shellfish allergy Hives and Rash 03/04/2019    Family History  Problem Relation Age of Onset  . Diabetes Mother   . Thyroid disease Mother   . Colon polyps Mother   . Stroke Father   . Mental retardation Maternal Aunt   . Diabetes Maternal Grandmother   . Colon cancer Neg Hx   . Esophageal cancer Neg Hx   . Stomach cancer Neg Hx   . Rectal cancer Neg Hx     Social History   Socioeconomic History  . Marital status: Divorced    Spouse name: Not on file  . Number of children: 2  . Years of education: Not on file  . Highest education level: Bachelor's degree (e.g., BA, AB, BS)  Occupational History  . Occupation: Training and development officer: [employer]  . Financial resource strain: Not on file  . Food insecurity    Worry: Not on file    Inability: Not on file  . Transportation needs    Medical: Not on file    Non-medical: Not on file  Tobacco Use  . Smoking status: Never Smoker  . Smokeless tobacco: Never Used  Substance and Sexual Activity  . Alcohol use: Not Currently    Alcohol/week: 0.0 standard drinks  . Drug use: No  . Sexual activity: Yes    Partners: Male    Birth control/protection: Surgical    Comment: BTSP 2005/TAH/BSO  Lifestyle  . Physical activity    Days per week: Not on file    Minutes per session: Not on file  . Stress: Not on file  Relationships  . Social Herbalist on phone: Not on file    Gets together: Not on file    Attends religious service: Not on file    Active member of club or organization: Not on file    Attends meetings of clubs or organizations: Not on file     Relationship status: Not on file  . Intimate partner violence    Fear of current or ex partner: Not on file    Emotionally abused: Not on file    Physically abused: Not on file    Forced sexual activity: Not on file  Other Topics Concern  . Not on file  Social History Narrative   Divorced mother of 2.  Lives with her son.  Works in Science writer work.  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    Physical  Exam: General: in no acute distress Neuro: Alert and appropriate GI: Localizes pain to lower abdomen Psych: Normal affect and normal insight   Mary Robles L. Tarri Glenn, MD, MPH [facility] 06/30/2019, 7:32 AM

## 2019-06-30 NOTE — H&P (View-Only) (Signed)
TELEHEALTH VISIT  Referring Provider: Alycia Rossetti, MD Primary Care Physician:  Alycia Rossetti, MD   Tele-visit due to COVID-19 pandemic Patient requested visit virtually, consented to the virtual encounter via video enabled telemedicine application (Zoom) Contact made at: 11:30 07/02/19 Patient verified by name and date of birth Location of patient: Home Location provider: [facility] Names of persons participating: Me, patient, [name] CMA Time spent on telehealth visit: 29 minutes I discussed the limitations of evaluation and management by telemedicine. The patient expressed understanding and agreed to proceed.  Chief complaint:  GERD, chronic nausea, hiatal hernia   IMPRESSION:  Post-prandial abdominal pain with nausea and bloating    - no source identified on EGD, CT scan, ultrasound, HIDA scan, or gastric emptying scan    - abdominal pain with oral Ensure consumption on HIDA    - EGD 06/05/19: no source, normal duodenal biopsies, mild gastropathy without H pylori Globus, unexplained by recent EGD or ENT evaluation    - reports normal ENT evaluation earlier this year    - EGD 06/05/19: mildly tortuous esophagus without reflux, H pylori, or gastric inlet patch Long-standing history of GERD improved with PPI therapy Small to moderate sized hiatal hernia  Unintentional 12 pound weight loss, weight now stabilizing No known family history of colon cancer  Family history of colon polyps (mother)  Etiology of abdominal pain, nausea, and bloating not identified on EGD, CT scan, ultrasound, HIDA scan, or gastric emptying study. The patient did experience abdominal pain with the oral Ensure consumption.  Consultation with Dr. Johney Maine recommended additional evaluation prior to reconsidering cholecystectomy.  Differential continues to include functional dyspepsia, reflux, food allergies, bacterial overgrowth, IBS, and symptomatic gallbladder disease.  Now  reporting globus without associated pain, lateralization of the symptoms, dysphagia, odynophagia, weight loss, a change in voice, presence of a neck or tonsillar mass, or unexplained cervical adenopathy. Recent EGD excludes gastroesophageal reflux or gastric inlet pouch in the proximal esophagus.  Will proceed with esophageal manometry, esophageal impedance, and pH testing to exclude esophageal motility disorders.  Trial of daily PPI therapy. Consider amitriptyline 25 mg daily if no response to PPI. An EKG in 04/2019 showed a prolonged QTc. However, it appears that QTc was normal on all prior ECGs. As the patient was hypokalemic at that time, will review with Dr. Buelah Manis to see if she is a candidate for amitriptyline.  PLAN: 24 pH probe and high resolution manometry Barium esophagram Continue pantoprazole 40 mg daily I will contact Dr. Buelah Manis re: candidacy for amitryiptyline given her ECG history Trial of Bentyl 20 mg daily if she is unable to take amitryptyline  Follow-up after these results are available  HPI: Mary Robles is a 43 y.o. female under evaluation of chronic nausea and epigastric pain.  The interval history is obtained through the patient, review of her electronic health record, and consultation records from Dr. Johney Maine (surgery).   Endoscopy 06/05/19: showed a mildly tortuous esophagus, mild gastritis, and a medium sized hiatal hernia, and normal duodenum.  Duodenal and gastric biopsies were essentially normal.  She was requesting surgical consultation to evaluate for symptomatic gallbladder disease.  I reviewed the records from her consultation with Dr. Johney Maine who was reluctant to do cholecystectomy.  He recommended additional evaluation of her symptoms.  He arranged for a gastric emptying scan 06/24/2019 that was normal.  Reflux. Ongoing nausea. Constant sense of globus with need for frequent throat clearing of mucous that she can't clear.  Ongoing metallic taste. Bloating with abdominal  distension that worsens as soon as she finishes eating. Appetite fluctuates.  Trying to avoid greasy foods without change in her symptoms. These foods trigger her symptoms. 169 pounds. Fluctuates between 169-172.  No nocturnal symptoms.  Intermittent heartburn. Feels left sided pressure as her manifestation of heartburn. No postprandial defecation. No change in pain with defecation.    Continues to take pantoprazole 40 mg BID. She wants to get rid of the mucous in the her throat.   Saw her ENT two months ago and was told every was clear.   She wondered if anxiety was contributing.   Recent imaging: Abdominal ultrasound 05/03/19: diffuse hepatic steatosis, otherwise normal exam CT abd/pelvis with contrast 05/03/19: small hiatal hernia, no source of pain identified HIDA 05/21/19: normal GB EF; some bloating and cramping with Ensure consumption GES 06/24/19: normal  Past Medical History:  Diagnosis Date  . Allergy   . Angio-edema   . Anxiety   . Asthma    no symptoms in "years", no inhaler  . Depression   . Herpes simplex   . Hiatal hernia   . Hyperlipidemia due to dietary fat intake   . Hyperlysinemia ([place])   . Hypertension   . Sickle cell trait ([place])   . Vitamin D deficiency     Past Surgical History:  Procedure Laterality Date  . ABDOMINAL HYSTERECTOMY N/A 05/04/2014   Procedure: HYSTERECTOMY ABDOMINAL with BILATERAL SALPINGECTOMY ;  Surgeon: Everardo All Amundson de Berton Lan, MD;  Location: [facility];  Service: Gynecology;  Laterality: N/A;  . BREAST BIOPSY Right Jan. 2016   benign fibroadenoma  . FOOT SURGERY    . LAPAROTOMY N/A 05/04/2014   Procedure: REMOVAL OF ABD WALL MASS ;  Surgeon: Adin Hector, MD;  Location: [facility];  Service: General;  Laterality: N/A;  . LIPOMA EXCISION N/A 05/04/2014   Procedure: EXCISION LIPOMA of abdominal wall;  Surgeon: Jamey Reas de Berton Lan, MD;  Location: [facility];  Service: Gynecology;  Laterality: N/A;  . NASAL SINUS SURGERY  2019   . PELVIC LAPAROSCOPY Right 2018   oophorectomy  . TUBAL LIGATION      Current Outpatient Medications  Medication Sig Dispense Refill  . acetaminophen (TYLENOL) 500 MG tablet Take 1,000 mg by mouth every 6 (six) hours as needed (for pain or headaches).    Marland Kitchen albuterol (PROVENTIL HFA;VENTOLIN HFA) 108 (90 Base) MCG/ACT inhaler Inhale 2 puffs into the lungs every 6 (six) hours as needed for wheezing or shortness of breath. 1 Inhaler 2  . amLODipine (NORVASC) 5 MG tablet Take 1 tablet (5 mg total) by mouth daily. 30 tablet 3  . amoxicillin-clavulanate (AUGMENTIN) 875-125 MG tablet Take 1 tablet by mouth every 12 (twelve) hours for 7 days. 13 tablet 0  . cloNIDine (CATAPRES) 0.1 MG tablet Take 1 tablet (0.1 mg total) by mouth 2 (two) times daily. 180 tablet 0  . EPINEPHrine (EPIPEN 2-PAK) 0.3 mg/0.3 mL IJ SOAJ injection Inject 0.3 mg into the muscle once as needed (for an anaphylactic reaction).    . fluconazole (DIFLUCAN) 150 MG tablet Take 1 tablet (150 mg total) by mouth every 3 (three) days as needed (for vaginal itching/yeast infection sx). 2 tablet 0  . fluticasone (FLONASE) 50 MCG/ACT nasal spray Place 2 sprays into both nostrils daily as needed for allergies or rhinitis. 16 g 2  . levocetirizine (XYZAL) 5 MG tablet Take 1 tablet (5 mg total) by mouth every evening.  90 tablet 1  . montelukast (SINGULAIR) 10 MG tablet TAKE 1 TABLET BY MOUTH AT BEDTIME 30 tablet 0  . Multiple Vitamin (MULTIVITAMIN) tablet Take 1 tablet by mouth daily.    . pantoprazole (PROTONIX) 40 MG tablet Take 1 tablet (40 mg total) by mouth 2 (two) times daily before a meal. 180 tablet 0  . trimethoprim-polymyxin b (POLYTRIM) ophthalmic solution 1 drop to affected eye four times a day for 5 days 10 mL 0  . valACYclovir (VALTREX) 500 MG tablet Take 1 tablet (500 mg total) by mouth 2 (two) times daily. Take for 3 days as needed. 30 tablet 2   No current facility-administered medications for this visit.     Allergies as  of 07/02/2019 - Review Complete 06/26/2019  Allergen Reaction Noted  . Vicodin [hydrocodone-acetaminophen] Other (See Comments) 05/04/2014  . Aldactone [spironolactone]  05/29/2019  . Benazepril Swelling 02/21/2018  . Betadine [povidone iodine] Other (See Comments) 07/12/2011  . Other Hives, Itching, and Other (See Comments) 07/07/2018  . Shellfish allergy Hives and Rash 03/04/2019    Family History  Problem Relation Age of Onset  . Diabetes Mother   . Thyroid disease Mother   . Colon polyps Mother   . Stroke Father   . Mental retardation Maternal Aunt   . Diabetes Maternal Grandmother   . Colon cancer Neg Hx   . Esophageal cancer Neg Hx   . Stomach cancer Neg Hx   . Rectal cancer Neg Hx     Social History   Socioeconomic History  . Marital status: Divorced    Spouse name: Not on file  . Number of children: 2  . Years of education: Not on file  . Highest education level: Bachelor's degree (e.g., BA, AB, BS)  Occupational History  . Occupation: Training and development officer: [employer]  . Financial resource strain: Not on file  . Food insecurity    Worry: Not on file    Inability: Not on file  . Transportation needs    Medical: Not on file    Non-medical: Not on file  Tobacco Use  . Smoking status: Never Smoker  . Smokeless tobacco: Never Used  Substance and Sexual Activity  . Alcohol use: Not Currently    Alcohol/week: 0.0 standard drinks  . Drug use: No  . Sexual activity: Yes    Partners: Male    Birth control/protection: Surgical    Comment: BTSP 2005/TAH/BSO  Lifestyle  . Physical activity    Days per week: Not on file    Minutes per session: Not on file  . Stress: Not on file  Relationships  . Social Herbalist on phone: Not on file    Gets together: Not on file    Attends religious service: Not on file    Active member of club or organization: Not on file    Attends meetings of clubs or organizations: Not on file     Relationship status: Not on file  . Intimate partner violence    Fear of current or ex partner: Not on file    Emotionally abused: Not on file    Physically abused: Not on file    Forced sexual activity: Not on file  Other Topics Concern  . Not on file  Social History Narrative   Divorced mother of 2.  Lives with her son.  Works in Science writer work.  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    Physical  Exam: General: in no acute distress Neuro: Alert and appropriate GI: Localizes pain to lower abdomen Psych: Normal affect and normal insight   Hoy Fallert L. Tarri Glenn, MD, MPH [facility] 06/30/2019, 7:32 AM

## 2019-07-02 ENCOUNTER — Encounter: Payer: Self-pay | Admitting: Gastroenterology

## 2019-07-02 ENCOUNTER — Ambulatory Visit (INDEPENDENT_AMBULATORY_CARE_PROVIDER_SITE_OTHER): Payer: 59 | Admitting: Gastroenterology

## 2019-07-02 VITALS — Ht 65.0 in | Wt 169.0 lb

## 2019-07-02 DIAGNOSIS — R109 Unspecified abdominal pain: Secondary | ICD-10-CM

## 2019-07-02 DIAGNOSIS — R0989 Other specified symptoms and signs involving the circulatory and respiratory systems: Secondary | ICD-10-CM

## 2019-07-02 DIAGNOSIS — K219 Gastro-esophageal reflux disease without esophagitis: Secondary | ICD-10-CM | POA: Diagnosis not present

## 2019-07-02 DIAGNOSIS — R198 Other specified symptoms and signs involving the digestive system and abdomen: Secondary | ICD-10-CM

## 2019-07-02 DIAGNOSIS — R110 Nausea: Secondary | ICD-10-CM | POA: Diagnosis not present

## 2019-07-02 NOTE — Addendum Note (Signed)
Addended by: Wyline Beady on: 07/02/2019 03:26 PM   Modules accepted: Orders

## 2019-07-02 NOTE — Patient Instructions (Addendum)
Please continue to take pantoprazole 40 mg daily.  I have recommended some additional testing including a barium esophagram, 24-hour pH probe, and high resolution manometry of your esophagus.  Let us plan to get together after those study results are available.  In the meantime, I will contact Dr. Buelah Manis to see if you would be a candidate for amitriptyline.  Please call with any questions or concerns prior to your next appointment.  You have been scheduled for an Esophageal Manometry and 24 Hour pH study at [facility] on 07-08-2019 at 10:30 am. Please arrive 30 minutes prior to your procedure for registration. You will need to go to outpatient registration (1st floor of the hospital) first. Make certain to bring your insurance cards as well as a complete list of medications.  Please remember the following:  1) Do not take any muscle relaxants, xanax (alprazolam) or ativan for 1 day prior to your test as well as the day of the test.  2) Nothing to eat or drink after 12:00 midnight on the night before your test.  3) Hold all diabetic medications/insulin the morning of the test. You may eat and take your medications after the test.  4) For 7 days prior to your test, do not take: Reglan, Tagamet, Zantac, Phenergan, Axid or Pepcid.  5) You MAY use an antacid such as Rolaids or Tums up to 12 hours prior to your test.  ------------------------------------------------------------------------------------------- ABOUT ESOPHAGEAL MANOMETRY Esophageal manometry (muh-NOM-uh-tree) is a test that gauges how well your esophagus works. Your esophagus is the long, muscular tube that connects your throat to your stomach. Esophageal manometry measures the rhythmic muscle contractions (peristalsis) that occur in your esophagus when you swallow. Esophageal manometry also measures the coordination and force exerted by the muscles of your esophagus.  During esophageal manometry, a thin, flexible  tube (catheter) that contains sensors is passed through your nose, down your esophagus and into your stomach. Esophageal manometry can be helpful in diagnosing some mostly uncommon disorders that affect your esophagus.  Why it's done Esophageal manometry is used to evaluate the movement (motility) of food through the esophagus and into the stomach. The test measures how well the circular bands of muscle (sphincters) at the top and bottom of your esophagus open and close, as well as the pressure, strength and pattern of the wave of esophageal muscle contractions that moves food along.  What you can expect Esophageal manometry is an outpatient procedure done without sedation. Most people tolerate it well. You may be asked to change into a hospital gown before the test starts.  During esophageal manometry  . While you are sitting up, a member of your health care team sprays your throat with a numbing medication or puts numbing gel in your nose or both.  . A catheter is guided through your nose into your esophagus. The catheter may be sheathed in a water-filled sleeve. It doesn't interfere with your breathing. However, your eyes may water, and you may gag. You may have a slight nosebleed from irritation.  . After the catheter is in place, you may be asked to lie on your back on an exam table, or you may be asked to remain seated.  . You then swallow small sips of water. As you do, a computer connected to the catheter records the pressure, strength and pattern of your esophageal muscle contractions.  . During the test, you'll be asked to breathe slowly and smoothly, remain as still as possible, and swallow  only when you're asked to do so.  . A member of your health care team may move the catheter down into your stomach while the catheter continues its measurements.  . The catheter then is slowly withdrawn.  This test typically takes 30-45 minutes to  complete.  ---------------------------------------------------------------------------------------------- ABOUT 24 HOUR PH PROBE An esophageal pH test measures and records the pH in your esophagus to determine if you have gastroesophageal reflux disease (GERD). The test can also be done to determine the effectiveness of medications or surgical treatment for GERD. What is esophageal reflux? Esophageal reflux is a condition in which stomach acid refluxes or moves back into the esophagus (the "food pipe" leading from the mouth to the stomach). How does the esophageal pH test work? A thin, small tube with an acid sensing device on the tip is gently passed through your nose, down the esophagus ("food tube"), and positioned about 2 inches above the lower esophageal sphincter. The tube is secured to the side of your face with clear tape. The end of the tube exiting from your nose is attached to a portable recorder that is worn on your belt or over your shoulder. The recorder has several buttons on it that you will press to mark certain events. A nurse will review the monitoring instructions with you. Once the test has begun, what do I need to know and do? Marland Kitchen Activity: Follow your usual daily routine. Do not reduce or change your activities during the monitoring period. Doing so can make the monitoring results less useful.  . Note: do not take a tub bath or shower; the equipment can't get wet.  . Eating: Eat your regular meals at the usual times. If you do not eat during the monitoring period, your stomach will not produce acid as usual, and the test results will not be accurate. Eat at least 2 meals a day. Eat foods that tend to increase your symptoms (without making yourself miserable). Avoid snacking. Do not suck on hard candy or lozenges and do not chew gum during the monitoring period.  . Lying down: Remain upright throughout the day. Do not lie down until you go to bed (unless napping or lying down during  the day is part of your daily routine).  . Medications: Continue to follow your doctor's advice regarding medications to avoid during the monitoring period.  . Recording symptoms: Press the appropriate button on your recorder when symptoms occur (as discussed with the nurse).  . Recording events: Record the time you start and stop eating and drinking (anything other than plain water). Record the time you lie down (even if just resting) and when you get back up. The nurse will explain this.  . Unusual symptoms or side effects. If you think you may be experiencing any unusual symptoms or side effects, call your doctor.  You will return the next day to have the tube removed. The information on the recorder will be downloaded to a computer and the results will be analyzed.  After completion of the study Resume your normal diet and medications. Lozenges or hard candy may help ease any sore throat caused by the tube.   It will take at least 2 weeks to receive the results of this test from your physician  Due to recent COVID-19 restrictions implemented by our local and state authorities and in an effort to keep both patients and staff as safe as possible, our hospital system now requires COVID-19 testing prior to any scheduled hospital  procedure. Please go to our [facility] location drive thru testing site (this is directly across from our office but on GPS will read an address of  [place], [place] [address]) on Monday 07-06-2019, any time between 9 am - 3 pm. You will not be billed at the time of testing but may receive a bill later depending on your insurance. The approximate cost of the test is $100. You must agree to quarantine from the time of your testing until the procedure date on 07-08-2019 . This should include staying at home with ONLY the people you live with. Avoid take-out, grocery store shopping or leaving the house for any non-emergent reason. Please call our  office at [phone] if you have any questions.   You have been scheduled for a Barium Esophogram at [facility] (1st floor of the hospital) on 07-10-2019 at 9:30 am. Please arrive 15 minutes prior to your appointment for registration. Make certain not to have anything to eat or drink 3 hours prior to your test. If you need to reschedule for any reason, please contact radiology at [phone] to do so. __________________________________________________________________ A barium swallow is an examination that concentrates on views of the esophagus. This tends to be a double contrast exam (barium and two liquids which, when combined, create a gas to distend the wall of the oesophagus) or single contrast (non-ionic iodine based). The study is usually tailored to your symptoms so a good history is essential. Attention is paid during the study to the form, structure and configuration of the esophagus, looking for functional disorders (such as aspiration, dysphagia, achalasia, motility and reflux) EXAMINATION You may be asked to change into a gown, depending on the type of swallow being performed. A radiologist and radiographer will perform the procedure. The radiologist will advise you of the type of contrast selected for your procedure and direct you during the exam. You will be asked to stand, sit or lie in several different positions and to hold a small amount of fluid in your mouth before being asked to swallow while the imaging is performed .In some instances you may be asked to swallow barium coated marshmallows to assess the motility of a solid food bolus. The exam can be recorded as a digital or video fluoroscopy procedure. POST PROCEDURE It will take 1-2 days for the barium to [place] through your system. To facilitate this, it is important, unless otherwise directed, to increase your fluids for the next 24-48hrs and to resume your normal diet.  This test typically takes about 30 minutes to  perform. __________________________________________________________________________________

## 2019-07-06 ENCOUNTER — Other Ambulatory Visit: Payer: Self-pay | Admitting: *Deleted

## 2019-07-06 ENCOUNTER — Other Ambulatory Visit (HOSPITAL_COMMUNITY)
Admission: RE | Admit: 2019-07-06 | Discharge: 2019-07-06 | Disposition: A | Discharge status: Home / Self Care | Payer: 59 | Source: Ambulatory Visit | Attending: Gastroenterology | Admitting: Gastroenterology

## 2019-07-06 ENCOUNTER — Telehealth: Payer: Self-pay | Admitting: *Deleted

## 2019-07-06 DIAGNOSIS — Z1159 Encounter for screening for other viral diseases: Secondary | ICD-10-CM | POA: Insufficient documentation

## 2019-07-06 NOTE — Progress Notes (Unsigned)
Entered in error

## 2019-07-06 NOTE — Telephone Encounter (Signed)
-----   Message from Thornton Park, MD sent at 07/06/2019 12:32 PM EDT ----- Regarding: RE: Candidate for tricyclic antidepressant Noted. Will await the results from her upcoming esophagram and pH probe. Thanks. ----- Message ----- From: Dalene Seltzer, RN Sent: 07/06/2019   8:22 AM EDT To: Thornton Park, MD Subject: RE: Candidate for tricyclic antidepressant     Spoke to the patient who reports she has taken the Epipen once in her life, last year after eating crab legs and developing hives, no resp distress of any kind. I spoke to the patient to let her know to be watch what she eats because there could be an interaction between the Epipen and the new medication, the patient decided that she didn't want to take the new medication due to this possible interaction even though she last used the epipen last year. Please advise.  ----- Message ----- From: Thornton Park, MD Sent: 07/06/2019   7:28 AM EDT To: Dalene Seltzer, RN Subject: Melton Alar: Candidate for tricyclic antidepressant     Please call [name]. Dr. Buelah Manis feels that we could safely try a low dose of amitriptyline. Please have her start 25 mg QHS. Thank you!  KLB ----- Message ----- From: Alycia Rossetti, MD Sent: 07/05/2019  10:13 PM EDT To: Thornton Park, MD Subject: RE: Candidate for tricyclic antidepressant     She has been seen by cardiology Dr. Ellyn Hack with work up fairly benign except her HTN.   I think she can try a low dose, we can always do repeat EKG on her in the office in a few weeks make sure nothing changed.  [place]  ----- Message ----- From: Thornton Park, MD Sent: 07/02/2019   1:15 PM EDT To: Alycia Rossetti, MD Subject: Candidate for tricyclic antidepressant         Dr. Buelah Manis,  I hope that you are doing well.  I saw Mary Robles in follow-up today.  She has ongoing globus not explained by ENT or EGD.  I was considering a trial of amitriptyline.  In reviewing her prior ECGs it looks like one in  May showed mild prolonged QTC.  This was at the time of some severe hypokalemia.  Each ECGs prior to that time do not show a QTC abnormality.  I wondered what she thought about her candidacy for amitriptyline given these findings.  Many thanks for your input.  Thornton Park [facility]

## 2019-07-07 ENCOUNTER — Ambulatory Visit (HOSPITAL_COMMUNITY): Payer: 59

## 2019-07-07 LAB — SARS CORONAVIRUS 2 (TAT 6-24 HRS): SARS Coronavirus 2: NEGATIVE

## 2019-07-08 ENCOUNTER — Ambulatory Visit (HOSPITAL_COMMUNITY)
Admission: RE | Admit: 2019-07-08 | Discharge: 2019-07-08 | Disposition: A | Discharge status: Home / Self Care | Payer: 59 | Attending: Gastroenterology | Admitting: Gastroenterology

## 2019-07-08 ENCOUNTER — Encounter (HOSPITAL_COMMUNITY)
Admission: RE | Disposition: A | Discharge status: Home / Self Care | Payer: Self-pay | Source: Home / Self Care | Attending: Gastroenterology

## 2019-07-08 DIAGNOSIS — R12 Heartburn: Secondary | ICD-10-CM

## 2019-07-08 DIAGNOSIS — K449 Diaphragmatic hernia without obstruction or gangrene: Secondary | ICD-10-CM | POA: Diagnosis not present

## 2019-07-08 DIAGNOSIS — K219 Gastro-esophageal reflux disease without esophagitis: Secondary | ICD-10-CM

## 2019-07-08 DIAGNOSIS — R05 Cough: Secondary | ICD-10-CM | POA: Diagnosis not present

## 2019-07-08 DIAGNOSIS — J45909 Unspecified asthma, uncomplicated: Secondary | ICD-10-CM | POA: Insufficient documentation

## 2019-07-08 DIAGNOSIS — B009 Herpesviral infection, unspecified: Secondary | ICD-10-CM | POA: Insufficient documentation

## 2019-07-08 DIAGNOSIS — R110 Nausea: Secondary | ICD-10-CM | POA: Insufficient documentation

## 2019-07-08 DIAGNOSIS — R059 Cough, unspecified: Secondary | ICD-10-CM

## 2019-07-08 DIAGNOSIS — D573 Sickle-cell trait: Secondary | ICD-10-CM | POA: Diagnosis not present

## 2019-07-08 DIAGNOSIS — I10 Essential (primary) hypertension: Secondary | ICD-10-CM | POA: Insufficient documentation

## 2019-07-08 HISTORY — PX: ESOPHAGEAL MANOMETRY: SHX5429

## 2019-07-08 HISTORY — PX: 24 HOUR PH STUDY: SHX5419

## 2019-07-08 SURGERY — MANOMETRY, ESOPHAGUS

## 2019-07-08 MED ORDER — LIDOCAINE VISCOUS HCL 2 % MT SOLN
OROMUCOSAL | Status: AC
  Filled 2019-07-08: qty 15

## 2019-07-08 SURGICAL SUPPLY — 2 items
FACESHIELD LNG OPTICON STERILE (SAFETY) IMPLANT
GLOVE BIO SURGEON STRL SZ8 (GLOVE) ×4 IMPLANT

## 2019-07-08 NOTE — Progress Notes (Signed)
Esophageal manometry performed per protocol. pH probe placed at 32 cm Left nare. Pt verbalized understading use of equipment and directions. Patient will return tomorrow at 1110 am to have probe removed.  Patient tolerated well. Dr Silverio Decamp notified.

## 2019-07-08 NOTE — Interval H&P Note (Signed)
History and Physical Interval Note:  07/08/2019 2:29 PM  Mary Robles  has presented today for surgery, with the diagnosis of GURD.  The various methods of treatment have been discussed with the patient and family. After consideration of risks, benefits and other options for treatment, the patient has consented to  Procedure(s): ESOPHAGEAL MANOMETRY (EM) (N/A) 24 HOUR PH STUDY (N/A) as a surgical intervention.  The patient's history has been reviewed, patient examined, no change in status, stable for surgery.  I have reviewed the patient's chart and labs.  Questions were answered to the patient's satisfaction.     Thornton Park

## 2019-07-09 ENCOUNTER — Encounter (HOSPITAL_COMMUNITY): Payer: Self-pay | Admitting: Gastroenterology

## 2019-07-10 ENCOUNTER — Other Ambulatory Visit: Payer: Self-pay

## 2019-07-10 ENCOUNTER — Ambulatory Visit (HOSPITAL_COMMUNITY)
Admission: RE | Admit: 2019-07-10 | Discharge: 2019-07-10 | Disposition: A | Discharge status: Home / Self Care | Payer: 59 | Source: Ambulatory Visit | Attending: Gastroenterology | Admitting: Gastroenterology

## 2019-07-10 DIAGNOSIS — R0989 Other specified symptoms and signs involving the circulatory and respiratory systems: Secondary | ICD-10-CM

## 2019-07-10 DIAGNOSIS — R198 Other specified symptoms and signs involving the digestive system and abdomen: Secondary | ICD-10-CM

## 2019-07-10 DIAGNOSIS — R110 Nausea: Secondary | ICD-10-CM | POA: Diagnosis present

## 2019-07-10 DIAGNOSIS — K219 Gastro-esophageal reflux disease without esophagitis: Secondary | ICD-10-CM | POA: Diagnosis present

## 2019-07-10 DIAGNOSIS — R109 Unspecified abdominal pain: Secondary | ICD-10-CM | POA: Diagnosis not present

## 2019-07-13 ENCOUNTER — Other Ambulatory Visit: Payer: Self-pay

## 2019-07-14 ENCOUNTER — Ambulatory Visit (INDEPENDENT_AMBULATORY_CARE_PROVIDER_SITE_OTHER): Payer: 59 | Admitting: Family Medicine

## 2019-07-14 ENCOUNTER — Encounter: Payer: Self-pay | Admitting: Family Medicine

## 2019-07-14 VITALS — BP 122/78 | HR 80 | Temp 98.4°F | Resp 12 | Ht 65.0 in | Wt 171.0 lb

## 2019-07-14 DIAGNOSIS — R51 Headache: Secondary | ICD-10-CM | POA: Diagnosis not present

## 2019-07-14 DIAGNOSIS — I10 Essential (primary) hypertension: Secondary | ICD-10-CM

## 2019-07-14 DIAGNOSIS — Z0001 Encounter for general adult medical examination with abnormal findings: Secondary | ICD-10-CM | POA: Diagnosis not present

## 2019-07-14 DIAGNOSIS — Z1239 Encounter for other screening for malignant neoplasm of breast: Secondary | ICD-10-CM

## 2019-07-14 DIAGNOSIS — R519 Headache, unspecified: Secondary | ICD-10-CM

## 2019-07-14 DIAGNOSIS — Z0000 Encounter for general adult medical examination without abnormal findings: Secondary | ICD-10-CM

## 2019-07-14 DIAGNOSIS — E782 Mixed hyperlipidemia: Secondary | ICD-10-CM

## 2019-07-14 DIAGNOSIS — J4520 Mild intermittent asthma, uncomplicated: Secondary | ICD-10-CM

## 2019-07-14 MED ORDER — PANTOPRAZOLE SODIUM 40 MG PO TBEC: 40.0000 mg | DELAYED_RELEASE_TABLET | Freq: Two times a day (BID) | ORAL | 3 refills | Status: DC

## 2019-07-14 MED ORDER — CLONIDINE HCL 0.1 MG PO TABS: 0.1000 mg | ORAL_TABLET | Freq: Two times a day (BID) | ORAL | 3 refills | Status: DC

## 2019-07-14 MED ORDER — EPINEPHRINE 0.3 MG/0.3ML IJ SOAJ: 0.3000 mg | Freq: Once | INTRAMUSCULAR | 0 refills | Status: DC | PRN

## 2019-07-14 MED ORDER — MONTELUKAST SODIUM 10 MG PO TABS: 10.0000 mg | ORAL_TABLET | Freq: Every day | ORAL | 3 refills | Status: DC

## 2019-07-14 NOTE — Patient Instructions (Addendum)
Schedule your mammogram  F/U 6 months

## 2019-07-14 NOTE — Progress Notes (Signed)
    Subjective:    Patient ID: Mary Robles, female    DOB: 1975-12-29, 43 y.o.   MRN: [id number]  Patient presents for Annual Exam (is fasting)   Pt here for CPE   Seen by GYN- PAP Smear UTD  Followed by GI and surgery (Dr. Johney Maine)- had recent EGD and barium swallow had abd pain in RUQ after the barium. Had to sit for a while   Weight continues to decrease, weight down 7lbs since beginning of July  yesterday ate cherries, pot roast and carrots, ate part of rice bowl beans/ corn, cheese, salsa from [place] - she has cut out fried foods, does not eat much meat   Considering removing her gallbladder   GI was considering elavil but pt declined due to potential med interatcion     Due for Mammogram- last done in 2019   Immunizations- TDAP UTD    When she gets headache when her reflux is bad, taking protonix 40mg  twice a day , has headache today but declines Toradol in house will take tylenol    HTN- blood pressure has been controlled    Bowels are irregular still   Recent labs- BMET normal in June    CBC normal in May    Due for LFT   TSH UTD    Review Of Systems:  GEN- denies fatigue, fever, weight loss,weakness, recent illness HEENT- denies eye drainage, change in vision, nasal discharge, CVS- denies chest pain, palpitations RESP- denies SOB, cough, wheeze ABD- denies N/V, change in stools, abd pain GU- denies dysuria, hematuria, dribbling, incontinence MSK- denies joint pain, muscle aches, injury Neuro- denies headache, dizziness, syncope, seizure activity       Objective:    BP 122/78   Pulse 80   Temp 98.4 F (36.9 C) (Oral)   Resp 12   Ht 5\' 5"  (1.651 m)   Wt 171 lb (77.6 kg)   LMP 04/17/2014 Comment: hysterectomy w/ single oophorectomy//a.c.  SpO2 97%   BMI 28.46 kg/m  GEN- NAD, alert and oriented x3 HEENT- PERRL, EOMI, non injected sclera, pink conjunctiva, MMM, oropharynx clear Neck- Supple, no thyromegaly CVS- RRR, no  murmur RESP-CTAB ABD-NABS,soft,NT,ND NEURO-CNII-XII in tact,no focal deficits Psych- flat affect, not anxious appearing, well groomed  EXT- No edema Pulses- Radial, DP- 2+        Assessment & Plan:      Problem List Items Addressed This Visit      Unprioritized   Hyperlipidemia (Chronic)   Relevant Orders   Lipid panel   Hypertension (Chronic)    Blood pressure continues under good control with her medications no changes.      Relevant Orders   Comprehensive metabolic panel    Other Visit Diagnoses    Routine general medical examination at a health care facility    -  Primary   CPE done, recheck lipids, LFT. otherwise recent labs normal, pt to schedule mammogram   Breast cancer screening       Relevant Orders   MM 3D SCREEN BREAST BILATERAL   Nonintractable headache, unspecified chronicity pattern, unspecified headache type       No red flags, declines toradol      Note: This dictation was prepared with Dragon dictation along with smaller phrase technology. Any transcriptional errors that result from this process are unintentional.

## 2019-07-14 NOTE — Addendum Note (Signed)
Addended by: Sheral Flow on: 07/14/2019 03:37 PM   Modules accepted: Orders

## 2019-07-14 NOTE — Assessment & Plan Note (Signed)
Blood pressure continues under good control with her medications no changes.

## 2019-07-15 ENCOUNTER — Encounter: Payer: Self-pay | Admitting: *Deleted

## 2019-07-15 LAB — COMPREHENSIVE METABOLIC PANEL
AG Ratio: 1.3 (calc) (ref 1.0–2.5)
ALT: 13 U/L (ref 6–29)
AST: 15 U/L (ref 10–30)
Albumin: 3.8 g/dL (ref 3.6–5.1)
Alkaline phosphatase (APISO): 47 U/L (ref 31–125)
BUN: 7 mg/dL (ref 7–25)
CO2: 23 mmol/L (ref 20–32)
Calcium: 9.3 mg/dL (ref 8.6–10.2)
Chloride: 104 mmol/L (ref 98–110)
Creat: 0.85 mg/dL (ref 0.50–1.10)
Globulin: 2.9 g/dL (calc) (ref 1.9–3.7)
Glucose, Bld: 85 mg/dL (ref 65–99)
Potassium: 4 mmol/L (ref 3.5–5.3)
Sodium: 140 mmol/L (ref 135–146)
Total Bilirubin: 0.9 mg/dL (ref 0.2–1.2)
Total Protein: 6.7 g/dL (ref 6.1–8.1)

## 2019-07-15 LAB — LIPID PANEL
Cholesterol: 180 mg/dL (ref <200)
HDL: 37 mg/dL — ABNORMAL LOW (ref >=50)
LDL Cholesterol (Calc): 121 mg/dL (calc) — ABNORMAL HIGH
Non-HDL Cholesterol (Calc): 143 mg/dL (calc) — ABNORMAL HIGH (ref <130)
Total CHOL/HDL Ratio: 4.9 (calc) (ref <5.0)
Triglycerides: 116 mg/dL (ref <150)

## 2019-07-17 DIAGNOSIS — K219 Gastro-esophageal reflux disease without esophagitis: Secondary | ICD-10-CM

## 2019-07-17 DIAGNOSIS — R05 Cough: Secondary | ICD-10-CM

## 2019-07-17 DIAGNOSIS — R059 Cough, unspecified: Secondary | ICD-10-CM

## 2019-07-17 DIAGNOSIS — R12 Heartburn: Secondary | ICD-10-CM

## 2019-08-10 ENCOUNTER — Ambulatory Visit: Payer: 59 | Admitting: Gastroenterology

## 2019-08-12 ENCOUNTER — Other Ambulatory Visit: Payer: Self-pay

## 2019-08-12 ENCOUNTER — Ambulatory Visit (INDEPENDENT_AMBULATORY_CARE_PROVIDER_SITE_OTHER): Payer: 59 | Admitting: Family Medicine

## 2019-08-12 ENCOUNTER — Encounter: Payer: Self-pay | Admitting: Family Medicine

## 2019-08-12 VITALS — BP 128/84 | HR 86 | Temp 98.6°F | Resp 12 | Ht 65.0 in | Wt 178.0 lb

## 2019-08-12 DIAGNOSIS — R252 Cramp and spasm: Secondary | ICD-10-CM | POA: Diagnosis not present

## 2019-08-12 DIAGNOSIS — I10 Essential (primary) hypertension: Secondary | ICD-10-CM

## 2019-08-12 NOTE — Progress Notes (Signed)
   Subjective:    Patient ID: Mary Robles, female    DOB: [birth year], 43 y.o.   MRN: [id number]  Patient presents for Labs (states that she has leg cramping, feels that she is really cold and fatigued- woul like her labs re-checked)   Pt here ongoing cramping min legs and feet  Also getting swelling around her ankles but only if standing to do hair for a hours, otherwise no swelling  Wants to have potassium rechecked    Concerned with anemia- feels she is cold all the time   Never started the supplement she brought in last time had mag, zinc, MVI in it    Review Of Systems:  GEN- + fatigue, fever,  weight loss,weakness, recent illness HEENT- denies eye drainage, change in vision, nasal discharge, CVS- denies chest pain, palpitations RESP- denies SOB, cough, wheeze ABD- denies N/V, change in stools, abd pain GU- denies dysuria, hematuria, dribbling, incontinence MSK- denies joint pain, muscle aches, injury Neuro- denies headache, dizziness, syncope, seizure activity       Objective:    BP 128/84   Pulse 86   Temp 98.6 F (37 C) (Oral)   Resp 12   Ht 5\' 5"  (1.651 m)   Wt 178 lb (80.7 kg)   LMP 04/17/2014 Comment: hysterectomy w/ single oophorectomy//a.c.  SpO2 100%   BMI 29.62 kg/m  GEN- NAD, alert and oriented x3 HEENT- PERRL, EOMI, non injected sclera, pink conjunctiva, MMM, oropharynx clear CVS- RRR, no murmur RESP-CTAB EXT- No edema Pulses- Radial, DP- 2+        Assessment & Plan:      Problem List Items Addressed This Visit      Unprioritized   Hypertension (Chronic)    Controlled, norvasc could contribute to ankle swelling But swelling is not persistant Can elevate feet Check labs for the cramps No anemia noted before       Relevant Orders   Basic metabolic panel (Completed)   CBC with Differential/Platelet (Completed)    Other Visit Diagnoses    Leg cramps    -  Primary   Relevant Orders   Basic metabolic panel (Completed)      Note:  This dictation was prepared with Dragon dictation along with smaller phrase technology. Any transcriptional errors that result from this process are unintentional.

## 2019-08-13 ENCOUNTER — Encounter: Payer: Self-pay | Admitting: Family Medicine

## 2019-08-13 LAB — BASIC METABOLIC PANEL
BUN: 9 mg/dL (ref 7–25)
CO2: 24 mmol/L (ref 20–32)
Calcium: 9.6 mg/dL (ref 8.6–10.2)
Chloride: 103 mmol/L (ref 98–110)
Creat: 0.85 mg/dL (ref 0.50–1.10)
Glucose, Bld: 73 mg/dL (ref 65–99)
Potassium: 4.4 mmol/L (ref 3.5–5.3)
Sodium: 139 mmol/L (ref 135–146)

## 2019-08-13 LAB — CBC WITH DIFFERENTIAL/PLATELET
Absolute Monocytes: 739 cells/uL (ref 200–950)
Basophils Absolute: 50 cells/uL (ref 0–200)
Basophils Relative: 0.6 %
Eosinophils Absolute: 100 cells/uL (ref 15–500)
Eosinophils Relative: 1.2 %
HCT: 42.8 % (ref 35.0–45.0)
Hemoglobin: 14.2 g/dL (ref 11.7–15.5)
Lymphs Abs: 3395 cells/uL (ref 850–3900)
MCH: 28.4 pg (ref 27.0–33.0)
MCHC: 33.2 g/dL (ref 32.0–36.0)
MCV: 85.6 fL (ref 80.0–100.0)
MPV: 10.9 fL (ref 7.5–12.5)
Monocytes Relative: 8.9 %
Neutro Abs: 4017 cells/uL (ref 1500–7800)
Neutrophils Relative %: 48.4 %
Platelets: 377 10*3/uL (ref 140–400)
RBC: 5 10*6/uL (ref 3.80–5.10)
RDW: 14.7 % (ref 11.0–15.0)
Total Lymphocyte: 40.9 %
WBC: 8.3 10*3/uL (ref 3.8–10.8)

## 2019-08-13 NOTE — Assessment & Plan Note (Signed)
Controlled, norvasc could contribute to ankle swelling But swelling is not persistant Can elevate feet Check labs for the cramps No anemia noted before

## 2019-09-08 ENCOUNTER — Ambulatory Visit
Admission: RE | Admit: 2019-09-08 | Discharge: 2019-09-08 | Disposition: A | Discharge status: Home / Self Care | Payer: 59 | Source: Ambulatory Visit | Attending: Family Medicine | Admitting: Family Medicine

## 2019-09-08 ENCOUNTER — Other Ambulatory Visit: Payer: Self-pay

## 2019-09-08 DIAGNOSIS — Z1239 Encounter for other screening for malignant neoplasm of breast: Secondary | ICD-10-CM

## 2019-09-18 ENCOUNTER — Encounter: Payer: Self-pay | Admitting: Gastroenterology

## 2019-09-18 ENCOUNTER — Ambulatory Visit (INDEPENDENT_AMBULATORY_CARE_PROVIDER_SITE_OTHER): Payer: 59 | Admitting: Gastroenterology

## 2019-09-18 DIAGNOSIS — K449 Diaphragmatic hernia without obstruction or gangrene: Secondary | ICD-10-CM | POA: Diagnosis not present

## 2019-09-18 DIAGNOSIS — R198 Other specified symptoms and signs involving the digestive system and abdomen: Secondary | ICD-10-CM

## 2019-09-18 DIAGNOSIS — R0989 Other specified symptoms and signs involving the circulatory and respiratory systems: Secondary | ICD-10-CM

## 2019-09-18 DIAGNOSIS — R109 Unspecified abdominal pain: Secondary | ICD-10-CM | POA: Diagnosis not present

## 2019-09-18 MED ORDER — DICYCLOMINE HCL 20 MG PO TABS: 20.0000 mg | ORAL_TABLET | Freq: Every day | ORAL | 1 refills | Status: DC

## 2019-09-18 NOTE — Patient Instructions (Addendum)
We have sent the following medications to your pharmacy for you to pick up at your convenience:  Bentyl 20 mg four times daily

## 2019-09-18 NOTE — Progress Notes (Addendum)
TELEHEALTH VISIT  Referring Provider: Alycia Rossetti, MD Primary Care Physician:  Alycia Rossetti, MD   Tele-visit due to COVID-19 pandemic Patient requested visit virtually, consented to the virtual encounter via video enabled telemedicine application (Zoom) Contact made at: 09/18/19 09:00 Patient verified by name and date of birth Location of patient: Home Location provider: [facility] Names of persons participating: Me, patient, Tinnie Gens CMA Time spent on telehealth visit: 22 minutes I discussed the limitations of evaluation and management by telemedicine. The patient expressed understanding and agreed to proceed.  Chief complaint:  GERD, chronic nausea, hiatal hernia   IMPRESSION:  Dyspepsia    - no source identified on EGD, CT scan, ultrasound, HIDA scan, or gastric emptying scan    - abdominal pain with oral Ensure consumption on HIDA    - EGD 06/05/19: no source, normal duodenal biopsies, mild gastropathy without H pylori Globus, unexplained by recent EGD or ENT evaluation    - reports normal ENT evaluation earlier this year    - EGD 06/05/19: mildly tortuous esophagus without reflux, H pylori, or gastric inlet patch    - no reflux on pH with impedance study and normal esophageal manometry Long-standing history of GERD improved with PPI therapy Small to moderate sized hiatal hernia  Unintentional 12 pound weight loss, weight now stabilizing No known family history of colon cancer  Family history of colon polyps (mother)  Etiology of dyspepsia not identified on EGD, CT scan, ultrasound, HIDA scan, or gastric emptying study. pH with impedance study and esophageal manometry showed no evidence for significant reflux.  Normal esophageal manometry.  Suspected functional dyspepsia exacerbated by anxiety. She remains curious about symptomatic gallbladder disease as she experienced abdominal pain with the oral Ensure consumption.  Consultation with Dr. Johney Maine  recommended additional evaluation prior to reconsidering cholecystectomy.   Trial of Bentyl recommended.   Persistent globus symptoms despite negative pH with impedance study and esophageal manometry. Minimal improvement on daily PPI therapy. She declined a trial of amitriptyline 25 mg given the QTc risk. Consider SP evaluation if globus persists.    PLAN: Trial of Bentyl 20 mg QID Consider Speech Pathology in the future if globus remains a predominant symptom Update Dr. Johney Maine regarding her recent negative evaluation Follow-up in 3 months or earlier as needed  HPI: Mary Robles is a 43 y.o. female under evaluation of chronic nausea and epigastric pain.   She returns in follow-up after an esophagram, pH with impedance study, and esophageal manometry. The interval history is obtained through the patient and review of her electronic health record.  Continues to have some GI symptoms despite 40 mg BID. She remains bothered by reflux and dysphonia. No brash but more a sense of constant globus with need for frequent throat clearing of mucous that she can't clear. Metallic taste has improved. Continues to have some bloating. She thinks this may be triggered by what she eats. Continues to avoid greasy and fried foods. No postprandial defecation.  Weight has increased because she's been forcing herself to eat.  Appetite continues to fluctuate.   Endoscopy 06/05/19: showed a mildly tortuous esophagus, mild gastritis, and a medium sized hiatal hernia, and normal duodenum.  Duodenal and gastric biopsies were essentially normal.  She was requesting surgical consultation to evaluate for symptomatic gallbladder disease.  I reviewed the records from her consultation with Dr. Johney Maine who was reluctant to do cholecystectomy.  He recommended additional evaluation of her symptoms.  He arranged for a  gastric emptying scan 06/24/2019 that was normal.  Endoscopic history: EGD 06/05/19: mildly tortuous esophagus, mild  gastritis, and a medium sized hiatal hernia, and normal duodenum.  Duodenal and gastric biopsies were essentially normal.   Recent imaging: Abdominal ultrasound 05/03/19: diffuse hepatic steatosis, otherwise normal exam CT abd/pelvis with contrast 05/03/19: small hiatal hernia, no source of pain identified HIDA 05/21/19: normal GB EF; some bloating and cramping with Ensure consumption GES 06/24/19: normal  Recent evaluation:  pH with impedance study 07/08/2019: Normal esophageal acid exposure with a DeMeester score of 1.9.  Esophageal acid exposure is normal in upright and supine position.  Very few reflux events.  No symptom correlation for abdominal pain, heartburn, or nausea with reflux events based on the symptom association probability.   Esophageal manometry 07/08/2019: Normal resting EG junction pressure with complete relaxation after deglutition.  Esophageal contractions were 100% peristaltic with normal distal latency and distal contractile integral.  Hiatal hernia is present.  Normal basal and residual upper esophageal pressures.  Normal relaxation and contraction phase with multiple rapid swallows.  Complete bolus clearance of 10 out of 10 swallows.   Past Medical History:  Diagnosis Date  . Allergy   . Angio-edema   . Anxiety   . Asthma    no symptoms in "years", no inhaler  . Depression   . Herpes simplex   . Hiatal hernia   . Hyperlipidemia due to dietary fat intake   . Hyperlysinemia ([place])   . Hypertension   . Sickle cell trait ([place])   . Vitamin D deficiency     Past Surgical History:  Procedure Laterality Date  . [place] STUDY N/A 07/08/2019   Procedure: [place] STUDY;  Surgeon: Thornton Park, MD;  Location: [facility];  Service: Gastroenterology;  Laterality: N/A;  . ABDOMINAL HYSTERECTOMY N/A 05/04/2014   Procedure: HYSTERECTOMY ABDOMINAL with BILATERAL SALPINGECTOMY ;  Surgeon: Jamey Reas de Berton Lan, MD;  Location: [facility];  Service: Gynecology;   Laterality: N/A;  . BREAST BIOPSY Right Jan. 2016   benign fibroadenoma  . ESOPHAGEAL MANOMETRY N/A 07/08/2019   Procedure: ESOPHAGEAL MANOMETRY (EM);  Surgeon: Thornton Park, MD;  Location: [facility];  Service: Gastroenterology;  Laterality: N/A;  . FOOT SURGERY    . LAPAROTOMY N/A 05/04/2014   Procedure: REMOVAL OF ABD WALL MASS ;  Surgeon: Adin Hector, MD;  Location: [facility];  Service: General;  Laterality: N/A;  . LIPOMA EXCISION N/A 05/04/2014   Procedure: EXCISION LIPOMA of abdominal wall;  Surgeon: Jamey Reas de Berton Lan, MD;  Location: [facility];  Service: Gynecology;  Laterality: N/A;  . NASAL SINUS SURGERY  2019  . PELVIC LAPAROSCOPY Right 2018   oophorectomy  . TUBAL LIGATION      Current Outpatient Medications  Medication Sig Dispense Refill  . acetaminophen (TYLENOL) 500 MG tablet Take 1,000 mg by mouth every 6 (six) hours as needed (for pain or headaches).    Marland Kitchen albuterol (PROVENTIL HFA;VENTOLIN HFA) 108 (90 Base) MCG/ACT inhaler Inhale 2 puffs into the lungs every 6 (six) hours as needed for wheezing or shortness of breath. 1 Inhaler 2  . amLODipine (NORVASC) 5 MG tablet Take 1 tablet (5 mg total) by mouth daily. 30 tablet 3  . cloNIDine (CATAPRES) 0.1 MG tablet Take 1 tablet (0.1 mg total) by mouth 2 (two) times daily. 180 tablet 3  . EPINEPHrine (EPIPEN 2-PAK) 0.3 mg/0.3 mL IJ SOAJ injection Inject 0.3 mLs (0.3 mg total)  into the muscle once as needed (for an anaphylactic reaction). 2 each 0  . fluticasone (FLONASE) 50 MCG/ACT nasal spray Place 2 sprays into both nostrils daily as needed for allergies or rhinitis. 16 g 2  . levocetirizine (XYZAL) 5 MG tablet Take 1 tablet (5 mg total) by mouth every evening. 90 tablet 1  . montelukast (SINGULAIR) 10 MG tablet Take 1 tablet (10 mg total) by mouth at bedtime. 90 tablet 3  . Multiple Vitamin (MULTIVITAMIN) tablet Take 1 tablet by mouth daily.    . pantoprazole (PROTONIX) 40 MG tablet Take 1 tablet (40 mg  total) by mouth 2 (two) times daily before a meal. 180 tablet 3  . valACYclovir (VALTREX) 500 MG tablet Take 1 tablet (500 mg total) by mouth 2 (two) times daily. Take for 3 days as needed. 30 tablet 2   No current facility-administered medications for this visit.     Allergies as of 09/18/2019 - Review Complete 08/13/2019  Allergen Reaction Noted  . Vicodin [hydrocodone-acetaminophen] Other (See Comments) 05/04/2014  . Aldactone [spironolactone]  05/29/2019  . Benazepril Swelling 02/21/2018  . Betadine [povidone iodine] Other (See Comments) 07/12/2011  . Other Hives, Itching, and Other (See Comments) 07/07/2018  . Shellfish allergy Hives and Rash 03/04/2019    Family History  Problem Relation Age of Onset  . Diabetes Mother   . Thyroid disease Mother   . Colon polyps Mother   . Stroke Father   . Mental retardation Maternal Aunt   . Diabetes Maternal Grandmother   . Colon cancer Neg Hx   . Esophageal cancer Neg Hx   . Stomach cancer Neg Hx   . Rectal cancer Neg Hx     Social History   Socioeconomic History  . Marital status: Divorced    Spouse name: Not on file  . Number of children: 2  . Years of education: Not on file  . Highest education level: Bachelor's degree (e.g., BA, AB, BS)  Occupational History  . Occupation: Training and development officer: [employer]  . Financial resource strain: Not on file  . Food insecurity    Worry: Not on file    Inability: Not on file  . Transportation needs    Medical: Not on file    Non-medical: Not on file  Tobacco Use  . Smoking status: Never Smoker  . Smokeless tobacco: Never Used  Substance and Sexual Activity  . Alcohol use: Not Currently    Alcohol/week: 0.0 standard drinks  . Drug use: No  . Sexual activity: Yes    Partners: Male    Birth control/protection: Surgical    Comment: BTSP 2005/TAH/BSO  Lifestyle  . Physical activity    Days per week: Not on file    Minutes per session: Not on file   . Stress: Not on file  Relationships  . Social Herbalist on phone: Not on file    Gets together: Not on file    Attends religious service: Not on file    Active member of club or organization: Not on file    Attends meetings of clubs or organizations: Not on file    Relationship status: Not on file  . Intimate partner violence    Fear of current or ex partner: Not on file    Emotionally abused: Not on file    Physically abused: Not on file    Forced sexual activity: Not on file  Other Topics Concern  .  Not on file  Social History Narrative   Divorced mother of 2.  Lives with her son.  Works in Science writer work.  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    Physical Exam: Exam limited due to nature of telehealth encounter General: in no acute distress Neuro: Alert and appropriate Psych: Normal affect and normal insight   Sharlett Lienemann L. Tarri Glenn, MD, MPH [facility] 09/18/2019, 8:25 AM

## 2019-09-20 ENCOUNTER — Other Ambulatory Visit: Payer: Self-pay | Admitting: Family Medicine

## 2019-09-21 ENCOUNTER — Telehealth: Payer: Self-pay | Admitting: Gastroenterology

## 2019-09-21 MED ORDER — DICYCLOMINE HCL 20 MG PO TABS: 20.0000 mg | ORAL_TABLET | Freq: Three times a day (TID) | ORAL | 1 refills | Status: DC

## 2019-09-21 NOTE — Telephone Encounter (Signed)
Left detailed message on patients voicemail. Rx sent in for 4x a day per Dr. Tarri Glenn

## 2019-09-29 ENCOUNTER — Encounter (HOSPITAL_COMMUNITY): Payer: Self-pay

## 2019-09-29 ENCOUNTER — Ambulatory Visit (HOSPITAL_COMMUNITY)
Admission: EM | Admit: 2019-09-29 | Discharge: 2019-09-29 | Disposition: A | Discharge status: Home / Self Care | Payer: 59 | Attending: Emergency Medicine | Admitting: Emergency Medicine

## 2019-09-29 ENCOUNTER — Telehealth: Payer: Self-pay | Admitting: Gastroenterology

## 2019-09-29 ENCOUNTER — Other Ambulatory Visit: Payer: Self-pay

## 2019-09-29 DIAGNOSIS — J019 Acute sinusitis, unspecified: Secondary | ICD-10-CM

## 2019-09-29 MED ORDER — AMOXICILLIN-POT CLAVULANATE 875-125 MG PO TABS: 1.0000 | ORAL_TABLET | Freq: Two times a day (BID) | ORAL | 0 refills | Status: DC

## 2019-09-29 MED ORDER — DEXAMETHASONE SODIUM PHOSPHATE 10 MG/ML IJ SOLN
INTRAMUSCULAR | Status: AC
  Filled 2019-09-29: qty 1

## 2019-09-29 MED ORDER — DEXAMETHASONE SODIUM PHOSPHATE 10 MG/ML IJ SOLN
10.0000 mg | Freq: Once | INTRAMUSCULAR | Status: AC
  Administered 2019-09-29: 10 mg via INTRAMUSCULAR

## 2019-09-29 MED ORDER — OLOPATADINE HCL 0.1 % OP SOLN: 1.0000 [drp] | Freq: Two times a day (BID) | OPHTHALMIC | 12 refills | Status: DC

## 2019-09-29 NOTE — ED Triage Notes (Addendum)
Patient presents to Urgent Care with complaints of possible allergic reaction to dicyclomine since 3 days ago. Patient reports she has been taking the prescription for 10 days, has been feeling nauseous after taking the pills, also developed nasal congestion and drainage.

## 2019-09-29 NOTE — Discharge Instructions (Addendum)
Please stop Bentyl, and see if symptoms resolve May use Zofran as needed for nausea if severe  Please continue Xyzal, Singulair and Flonase Begin Augmentin twice daily for the next week We gave you a shot of Decadron prior to leaving [place] olopatadine eyedrops twice daily for itching and watering  Please follow-up if symptoms not resolving or worsening

## 2019-09-29 NOTE — Telephone Encounter (Signed)
I am recommending a trial of FDGard - 2 capsules taken twice daily for 4 weeks.  Please take FDGard 30 to 60 minutes before meals with water.  There is no distinct pattern of side effects with FDGard, but, sometimes patients experience a mild tingling sensation in the gut within the first 30 minutes. This sensation should subsequently subside.

## 2019-09-29 NOTE — ED Provider Notes (Signed)
Mary Robles    CSN: [id number] Arrival date & time: 09/29/19  1857      History   Chief Complaint Chief Complaint  Patient presents with   Allergic Reaction    HPI Mary Robles is a 43 y.o. female history of hypertension, hyperlipidemia, hiatal hernia, asthma, allergies, anxiety, presenting today for evaluation of nausea and sinus symptoms.  Patient notes that approximately 10 days ago she began taking Bentyl to help with stomach cramping and discomfort.  She states that after she started taking this.  She started to have some nausea and does not feel well after she takes the medicine.  She has not seen improvement in her cramping sensation.  She also notes that over the past week she is also had nasal congestion drainage and sinus pressure.  Notes that she has history of sinus issues.  Has been using Xyzal Singulair and Flonase for her regular allergy symptoms.  Denies sore throat or cough.  Denies fevers chills or body aches.  Denies rash, shortness of breath, chest pain or difficulty breathing.  She is concerned about possible allergic reaction to the Bentyl.  She also notes that she has had some eye watering and itching.  Also endorses dry mouth and dizziness after reviewing common side effects of Bentyl.  HPI  Past Medical History:  Diagnosis Date   Allergy    Angio-edema    Anxiety    Asthma    no symptoms in "years", no inhaler   Depression    Herpes simplex    Hiatal hernia    Hyperlipidemia due to dietary fat intake    Hyperlysinemia (HCC)    Hypertension    Sickle cell trait (HCC)    Vitamin D deficiency     Patient Active Problem List   Diagnosis Date Noted   Cough    Gastroesophageal reflux disease    Heartburn    Chest pain with low risk for cardiac etiology 09/17/2018   DOE (dyspnea on exertion) 09/17/2018   Obesity (BMI 30.0-34.9) 07/02/2017   MDD (major depressive disorder) 07/16/2016   Asthma, mild 04/11/2015   GAD  (generalized anxiety disorder) 02/08/2015   Hyperlipidemia 07/09/2014   Insomnia 07/09/2014   Lipoma of abdominal wall s/p excision 05/04/2014 05/19/2014   Status post total abdominal hysterectomy 05/04/2014   Hypertension    Vitamin D deficiency     Past Surgical History:  Procedure Laterality Date   72 HOUR [place] STUDY N/A 07/08/2019   Procedure: 24 HOUR [place] STUDY;  Surgeon: Thornton Park, MD;  Location: [facility];  Service: Gastroenterology;  Laterality: N/A;   ABDOMINAL HYSTERECTOMY N/A 05/04/2014   Procedure: HYSTERECTOMY ABDOMINAL with BILATERAL SALPINGECTOMY ;  Surgeon: Jamey Reas de Berton Lan, MD;  Location: [facility];  Service: Gynecology;  Laterality: N/A;   BREAST BIOPSY Right Jan. 2016   benign fibroadenoma   ESOPHAGEAL MANOMETRY N/A 07/08/2019   Procedure: ESOPHAGEAL MANOMETRY (EM);  Surgeon: Thornton Park, MD;  Location: [facility];  Service: Gastroenterology;  Laterality: N/A;   FOOT SURGERY     LAPAROTOMY N/A 05/04/2014   Procedure: REMOVAL OF ABD WALL MASS ;  Surgeon: Adin Hector, MD;  Location: [facility];  Service: General;  Laterality: N/A;   LIPOMA EXCISION N/A 05/04/2014   Procedure: EXCISION LIPOMA of abdominal wall;  Surgeon: Jamey Reas de Berton Lan, MD;  Location: [facility];  Service: Gynecology;  Laterality: N/A;   NASAL SINUS SURGERY  2019   PELVIC  LAPAROSCOPY Right 2018   oophorectomy   TUBAL LIGATION      OB History    Gravida  4   Para  2   Term      Preterm      AB  1   Living  2     SAB      TAB  1   Ectopic      Multiple      Live Births               Home Medications    Prior to Admission medications   Medication Sig Start Date End Date Taking? Authorizing Provider  dicyclomine (BENTYL) 20 MG tablet Take 1 tablet (20 mg total) by mouth 4 (four) times daily -  before meals and at bedtime. 09/21/19  Yes Thornton Park, MD  acetaminophen (TYLENOL) 500 MG tablet Take 1,000 mg by  mouth every 6 (six) hours as needed (for pain or headaches).    [provider]  albuterol (PROVENTIL HFA;VENTOLIN HFA) 108 (90 Base) MCG/ACT inhaler Inhale 2 puffs into the lungs every 6 (six) hours as needed for wheezing or shortness of breath. 01/09/19   [place], Modena Nunnery, MD  amLODipine (NORVASC) 5 MG tablet Take 1 tablet by mouth once daily 09/21/19   Alycia Rossetti, MD  amoxicillin-clavulanate (AUGMENTIN) 875-125 MG tablet Take 1 tablet by mouth every 12 (twelve) hours. 09/29/19   Jahnay Lantier C, PA-C  cloNIDine (CATAPRES) 0.1 MG tablet Take 1 tablet (0.1 mg total) by mouth 2 (two) times daily. 07/14/19   [place], Modena Nunnery, MD  EPINEPHrine (EPIPEN 2-PAK) 0.3 mg/0.3 mL IJ SOAJ injection Inject 0.3 mLs (0.3 mg total) into the muscle once as needed (for an anaphylactic reaction). 07/14/19   Alycia Rossetti, MD  fluticasone [facility]) 50 MCG/ACT nasal spray Place 2 sprays into both nostrils daily as needed for allergies or rhinitis. 01/09/19   [place], Modena Nunnery, MD  levocetirizine (XYZAL) 5 MG tablet Take 1 tablet (5 mg total) by mouth every evening. 03/04/19   Delsa Grana, PA-C  montelukast (SINGULAIR) 10 MG tablet Take 1 tablet (10 mg total) by mouth at bedtime. 07/14/19   Alycia Rossetti, MD  Multiple Vitamin (MULTIVITAMIN) tablet Take 1 tablet by mouth daily.    [provider]  olopatadine (PATANOL) 0.1 % ophthalmic solution Place 1 drop into both eyes 2 (two) times daily. 09/29/19   Kileen Lange C, PA-C  pantoprazole (PROTONIX) 40 MG tablet Take 1 tablet (40 mg total) by mouth 2 (two) times daily before a meal. 07/14/19   , Modena Nunnery, MD  valACYclovir (VALTREX) 500 MG tablet Take 1 tablet (500 mg total) by mouth 2 (two) times daily. Take for 3 days as needed. 06/23/19   Nunzio Cobbs, MD    Family History Family History  Problem Relation Age of Onset   Diabetes Mother    Thyroid disease Mother    Colon polyps Mother    Stroke Father    Mental  retardation Maternal Aunt    Diabetes Maternal Grandmother    Colon cancer Neg Hx    Esophageal cancer Neg Hx    Stomach cancer Neg Hx    Rectal cancer Neg Hx     Social History Social History   Tobacco Use   Smoking status: Never Smoker   Smokeless tobacco: Never Used  Substance Use Topics   Alcohol use: Not Currently    Alcohol/week: 0.0 standard drinks  Drug use: No     Allergies   Vicodin [hydrocodone-acetaminophen], Aldactone [spironolactone], Benazepril, Betadine [povidone iodine], Other, and Shellfish allergy   Review of Systems Review of Systems  Constitutional: Negative for activity change, appetite change, chills, fatigue and fever.  HENT: Positive for congestion, rhinorrhea and sinus pressure. Negative for ear pain, sore throat and trouble swallowing.   Eyes: Negative for discharge and redness.  Respiratory: Negative for cough, chest tightness and shortness of breath.   Cardiovascular: Negative for chest pain.  Gastrointestinal: Positive for nausea. Negative for abdominal pain, diarrhea and vomiting.  Musculoskeletal: Negative for myalgias.  Skin: Negative for rash.  Neurological: Negative for dizziness, light-headedness and headaches.     Physical Exam Triage Vital Signs ED Triage Vitals  Enc Vitals Group     BP 09/29/19 1929 (!) 166/97     Pulse Rate 09/29/19 1929 87     Resp 09/29/19 1929 16     Temp 09/29/19 1929 99.3 F (37.4 C)     Temp Source 09/29/19 1929 Temporal     SpO2 09/29/19 1929 100 %     Weight --      Height --      Head Circumference --      Peak Flow --      Pain Score 09/29/19 1927 0     Pain Loc --      Pain Edu? --      Excl. in [place]? --    No data found.  Updated Vital Signs BP (!) 166/97 (BP Location: Right Arm)    Pulse 87    Temp 99.3 F (37.4 C) (Temporal)    Resp 16    LMP 04/17/2014 Comment: hysterectomy w/ single oophorectomy//a.c.   SpO2 100%   Visual Acuity Right Eye Distance:   Left Eye Distance:     Bilateral Distance:    Right Eye Near:   Left Eye Near:    Bilateral Near:     Physical Exam Vitals signs and nursing note reviewed.  Constitutional:      General: She is not in acute distress.    Appearance: She is well-developed.  HENT:     Head: Normocephalic and atraumatic.     Ears:     Comments: Bilateral ears without tenderness to palpation of external auricle, tragus and mastoid, EAC's without erythema or swelling, TM's with good bony landmarks and cone of light. Non erythematous.     Nose:     Comments: Nasal mucosa erythematous, nonswollen turbinates    Mouth/Throat:     Comments: Oral mucosa pink and moist, no tonsillar enlargement or exudate. Posterior pharynx patent and nonerythematous, no uvula deviation or swelling. Normal phonation.  Eyes:     Conjunctiva/sclera: Conjunctivae normal.  Neck:     Musculoskeletal: Neck supple.  Cardiovascular:     Rate and Rhythm: Normal rate and regular rhythm.     Heart sounds: No murmur.  Pulmonary:     Effort: Pulmonary effort is normal. No respiratory distress.     Breath sounds: Normal breath sounds.     Comments: Breathing comfortably at rest, CTABL, no wheezing, rales or other adventitious sounds auscultated Abdominal:     Palpations: Abdomen is soft.     Tenderness: There is no abdominal tenderness.  Skin:    General: Skin is warm and dry.     Comments: No rash noted  Neurological:     Mental Status: She is alert.      UC Treatments / Results  Labs (all labs ordered are listed, but only abnormal results are displayed) Labs Reviewed - No data to display  EKG   Radiology No results found.  Procedures Procedures (including critical care time)  Medications Ordered in UC Medications  dexamethasone (DECADRON) injection 10 mg (10 mg Intramuscular Given 09/29/19 2010)  dexamethasone (DECADRON) 10 MG/ML injection (has no administration in time range)    Initial Impression / Assessment and Plan / UC Course   I have reviewed the triage vital signs and the nursing notes.  Pertinent labs & imaging results that were available during my care of the patient were reviewed by me and considered in my medical decision making (see chart for details).    Patient appears to be having more side effects/adverse reactions to Bentyl versus true allergic reaction.  Advised patient to stop Bentyl to see if nausea improves with this.  Advised she May use Zofran as needed which she stated that she has at home.  Sinus symptoms seem separate from Bentyl use.  Have been going on for over 1 week.  We will go ahead and cover for sinusitis, has been using OTC/allergy medicines without relief.  Will provide Augmentin to take x1 week, also providing Decadron IM 10 mg prior to discharge as she is also had relief with steroid injections are related to her allergies/sinusitis in the past.  Discussed strict return precautions. Patient verbalized understanding and is agreeable with plan.   Final Clinical Impressions(s) / UC Diagnoses   Final diagnoses:  Acute sinusitis with symptoms > 10 days     Discharge Instructions     Please stop Bentyl, and see if symptoms resolve May use Zofran as needed for nausea if severe  Please continue Xyzal, Singulair and Flonase Begin Augmentin twice daily for the next week We gave you a shot of Decadron prior to leaving [place] olopatadine eyedrops twice daily for itching and watering  Please follow-up if symptoms not resolving or worsening   ED Prescriptions    Medication Sig Dispense Auth. Provider   amoxicillin-clavulanate (AUGMENTIN) 875-125 MG tablet Take 1 tablet by mouth every 12 (twelve) hours. 14 tablet Renelda Kilian C, PA-C   olopatadine (PATANOL) 0.1 % ophthalmic solution Place 1 drop into both eyes 2 (two) times daily. 5 mL Jaquavion Mccannon, Emmet C, PA-C     PDMP not reviewed this encounter.   Janith Lima, [place] 09/29/19 2043

## 2019-09-29 NOTE — Telephone Encounter (Signed)
Pt states that dicyclomine is making her dizzy and nauseous.

## 2019-09-29 NOTE — Telephone Encounter (Signed)
Dr. Tarri Glenn, please see note below. Any possible alternative medication due to the patient's side effects?

## 2019-09-30 NOTE — Telephone Encounter (Signed)
Spoke with the patient and told the patient the new recommendations from Dr. Tarri Glenn. The patient stated she would go get the [facility] and give this a try.

## 2019-10-10 ENCOUNTER — Other Ambulatory Visit: Payer: Self-pay | Admitting: Family Medicine

## 2019-10-12 ENCOUNTER — Other Ambulatory Visit: Payer: Self-pay

## 2019-10-12 MED ORDER — LEVOCETIRIZINE DIHYDROCHLORIDE 5 MG PO TABS: 5.0000 mg | ORAL_TABLET | Freq: Every evening | ORAL | 1 refills | Status: DC

## 2019-10-22 ENCOUNTER — Other Ambulatory Visit: Payer: Self-pay | Admitting: Family Medicine

## 2019-11-20 ENCOUNTER — Other Ambulatory Visit: Payer: Self-pay

## 2019-11-20 ENCOUNTER — Encounter (HOSPITAL_BASED_OUTPATIENT_CLINIC_OR_DEPARTMENT_OTHER): Payer: Self-pay | Admitting: *Deleted

## 2019-11-20 DIAGNOSIS — R002 Palpitations: Secondary | ICD-10-CM | POA: Insufficient documentation

## 2019-11-20 DIAGNOSIS — Z79899 Other long term (current) drug therapy: Secondary | ICD-10-CM | POA: Diagnosis not present

## 2019-11-20 DIAGNOSIS — I10 Essential (primary) hypertension: Secondary | ICD-10-CM | POA: Insufficient documentation

## 2019-11-20 DIAGNOSIS — J45909 Unspecified asthma, uncomplicated: Secondary | ICD-10-CM | POA: Diagnosis not present

## 2019-11-20 LAB — CBC WITH DIFFERENTIAL/PLATELET
Abs Immature Granulocytes: 0.02 10*3/uL (ref 0.00–0.07)
Basophils Absolute: 0.1 10*3/uL (ref 0.0–0.1)
Basophils Relative: 1 %
Eosinophils Absolute: 0.2 10*3/uL (ref 0.0–0.5)
Eosinophils Relative: 2 %
HCT: 41.1 % (ref 36.0–46.0)
Hemoglobin: 14.3 g/dL (ref 12.0–15.0)
Immature Granulocytes: 0 %
Lymphocytes Relative: 39 %
Lymphs Abs: 4.1 10*3/uL — ABNORMAL HIGH (ref 0.7–4.0)
MCH: 27.6 pg (ref 26.0–34.0)
MCHC: 34.8 g/dL (ref 30.0–36.0)
MCV: 79.2 fL — ABNORMAL LOW (ref 80.0–100.0)
Monocytes Absolute: 1 10*3/uL (ref 0.1–1.0)
Monocytes Relative: 10 %
Neutro Abs: 5.3 10*3/uL (ref 1.7–7.7)
Neutrophils Relative %: 48 %
Platelets: 340 10*3/uL (ref 150–400)
RBC: 5.19 MIL/uL — ABNORMAL HIGH (ref 3.87–5.11)
RDW: 14.2 % (ref 11.5–15.5)
WBC: 10.7 10*3/uL — ABNORMAL HIGH (ref 4.0–10.5)
nRBC: 0 % (ref 0.0–0.2)

## 2019-11-20 LAB — COMPREHENSIVE METABOLIC PANEL
ALT: 18 U/L (ref 0–44)
AST: 19 U/L (ref 15–41)
Albumin: 4 g/dL (ref 3.5–5.0)
Alkaline Phosphatase: 60 U/L (ref 38–126)
Anion gap: 10 (ref 5–15)
BUN: 8 mg/dL (ref 6–20)
CO2: 23 mmol/L (ref 22–32)
Calcium: 9.3 mg/dL (ref 8.9–10.3)
Chloride: 103 mmol/L (ref 98–111)
Creatinine, Ser: 0.74 mg/dL (ref 0.44–1.00)
GFR calc Af Amer: >60 mL/min (ref >60)
GFR calc non Af Amer: >60 mL/min (ref >60)
Glucose, Bld: 103 mg/dL — ABNORMAL HIGH (ref 70–99)
Potassium: 3.9 mmol/L (ref 3.5–5.1)
Sodium: 136 mmol/L (ref 135–145)
Total Bilirubin: 0.7 mg/dL (ref 0.3–1.2)
Total Protein: 7.6 g/dL (ref 6.5–8.1)

## 2019-11-20 NOTE — ED Triage Notes (Signed)
Pt c/o palpitations and muscle cramping x 1 week. HX hypokalemia

## 2019-11-21 ENCOUNTER — Encounter (HOSPITAL_BASED_OUTPATIENT_CLINIC_OR_DEPARTMENT_OTHER): Payer: Self-pay | Admitting: Emergency Medicine

## 2019-11-21 ENCOUNTER — Emergency Department (HOSPITAL_BASED_OUTPATIENT_CLINIC_OR_DEPARTMENT_OTHER)
Admission: EM | Admit: 2019-11-21 | Discharge: 2019-11-21 | Disposition: A | Discharge status: Home / Self Care | Payer: 59 | Attending: Emergency Medicine | Admitting: Emergency Medicine

## 2019-11-21 DIAGNOSIS — R002 Palpitations: Secondary | ICD-10-CM

## 2019-11-21 HISTORY — DX: Hypokalemia: E87.6

## 2019-11-21 NOTE — ED Provider Notes (Signed)
[facility] Provider Note: Georgena Spurling, MD, FACEP  CSN: [id number] MRN: [id number] ARRIVAL: 11/20/19 at 2230 ROOM: MH07/MH07   CHIEF COMPLAINT  Palpitations   HISTORY OF PRESENT ILLNESS  11/21/19 1:25 AM Mary Robles is a 43 y.o. female who has a history of hypokalemia.  She has been having cramping in her hands and muscles beginning about a week ago.  Symptoms have subsequently improved but she is concerned her potassium may be low.  She also had an episode of palpitations yesterday which she describes as feeling like her heart was racing.  It lasted about 10 minutes and resolved on its own.  It is unclear if anything triggered that.  She is asymptomatic now except for a sharp pain in the center of her sternum which is worse with deep breathing.  This pain is mild.   Past Medical History:  Diagnosis Date  . Allergy   . Angio-edema   . Anxiety   . Asthma    no symptoms in "years", no inhaler  . Depression   . Herpes simplex   . Hiatal hernia   . Hyperlipidemia due to dietary fat intake   . Hyperlysinemia ([place])   . Hypertension   . Hypokalemia   . Sickle cell trait ([place])   . Vitamin D deficiency     Past Surgical History:  Procedure Laterality Date  . [place] STUDY N/A 07/08/2019   Procedure: [place] STUDY;  Surgeon: Thornton Park, MD;  [facility]: [facility];  Service: Gastroenterology;  Laterality: N/A;  . ABDOMINAL HYSTERECTOMY N/A 05/04/2014   Procedure: HYSTERECTOMY ABDOMINAL with BILATERAL SALPINGECTOMY ;  Surgeon: Jamey Reas de Berton Lan, MD;  Location: [facility];  Service: Gynecology;  Laterality: N/A;  . BREAST BIOPSY Right Jan. 2016   benign fibroadenoma  . ESOPHAGEAL MANOMETRY N/A 07/08/2019   Procedure: ESOPHAGEAL MANOMETRY (EM);  Surgeon: Thornton Park, MD;  Location: [facility];  Service: Gastroenterology;  Laterality: N/A;  . FOOT SURGERY    . LAPAROTOMY N/A 05/04/2014   Procedure: REMOVAL OF ABD WALL MASS ;  Surgeon:  Adin Hector, MD;  Location: [facility];  Service: General;  Laterality: N/A;  . LIPOMA EXCISION N/A 05/04/2014   Procedure: EXCISION LIPOMA of abdominal wall;  Surgeon: Jamey Reas de Berton Lan, MD;  Location: [facility];  Service: Gynecology;  Laterality: N/A;  . NASAL SINUS SURGERY  2019  . [place] LAPAROSCOPY Right 2018   oophorectomy  . TUBAL LIGATION      Family History  Problem Relation Age of Onset  . Diabetes Mother   . Thyroid disease Mother   . Colon polyps Mother   . Stroke Father   . Mental retardation Maternal Aunt   . Diabetes Maternal Grandmother   . Colon cancer Neg Hx   . Esophageal cancer Neg Hx   . Stomach cancer Neg Hx   . Rectal cancer Neg Hx     Social History   Tobacco Use  . Smoking status: Never Smoker  . Smokeless tobacco: Never Used  Substance Use Topics  . Alcohol use: Not Currently    Alcohol/week: 0.0 standard drinks  . Drug use: No    Prior to Admission medications   Medication Sig Start Date End Date Taking? Authorizing Provider  acetaminophen (TYLENOL) 500 MG tablet Take 1,000 mg by mouth every 6 (six) hours as needed (for pain or headaches).    [provider]  albuterol (PROVENTIL HFA;VENTOLIN  HFA) 108 (90 Base) MCG/ACT inhaler Inhale 2 puffs into the lungs every 6 (six) hours as needed for wheezing or shortness of breath. 01/09/19   [place], Modena Nunnery, MD  amLODipine (NORVASC) 5 MG tablet Take 1 tablet by mouth once daily 10/22/19   Alycia Rossetti, MD  amoxicillin-clavulanate (AUGMENTIN) 875-125 MG tablet Take 1 tablet by mouth every 12 (twelve) hours. 09/29/19   [place], Hallie C, PA-C  cloNIDine (CATAPRES) 0.1 MG tablet Take 1 tablet (0.1 mg total) by mouth 2 (two) times daily. 07/14/19   [place], Modena Nunnery, MD  dicyclomine (BENTYL) 20 MG tablet Take 1 tablet (20 mg total) by mouth 4 (four) times daily -  before meals and at bedtime. 09/21/19   Thornton Park, MD  EPINEPHrine (EPIPEN 2-PAK) 0.3 mg/0.3 mL IJ SOAJ injection  Inject 0.3 mLs (0.3 mg total) into the muscle once as needed (for an anaphylactic reaction). 07/14/19   Alycia Rossetti, MD  fluticasone [facility]) 50 MCG/ACT nasal spray Place 2 sprays into both nostrils daily as needed for allergies or rhinitis. 01/09/19   [place], Modena Nunnery, MD  levocetirizine (XYZAL) 5 MG tablet Take 1 tablet (5 mg total) by mouth every evening. 10/12/19   [place], Modena Nunnery, MD  montelukast (SINGULAIR) 10 MG tablet Take 1 tablet (10 mg total) by mouth at bedtime. 07/14/19   Alycia Rossetti, MD  Multiple Vitamin (MULTIVITAMIN) tablet Take 1 tablet by mouth daily.    [provider]  olopatadine (PATANOL) 0.1 % ophthalmic solution Place 1 drop into both eyes 2 (two) times daily. 09/29/19   [place], Hallie C, PA-C  pantoprazole (PROTONIX) 40 MG tablet Take 1 tablet (40 mg total) by mouth 2 (two) times daily before a meal. 07/14/19   [place], Modena Nunnery, MD  valACYclovir (VALTREX) 500 MG tablet Take 1 tablet (500 mg total) by mouth 2 (two) times daily. Take for 3 days as needed. 06/23/19   Nunzio Cobbs, MD    Allergies Vicodin [hydrocodone-acetaminophen], Aldactone [spironolactone], Benazepril, Betadine [povidone iodine], Other, and Shellfish allergy   REVIEW OF SYSTEMS  Negative except as noted here or in the History of Present Illness.   PHYSICAL EXAMINATION  Initial Vital Signs Blood pressure (!) 155/115, pulse 77, temperature 98.2 F (36.8 C), resp. rate 16, height 5\' 5"  (1.651 m), weight 78.5 kg, last menstrual period 04/17/2014, SpO2 100 %.  Examination General: Well-developed, well-nourished female in no acute distress; appearance consistent with age of record HENT: normocephalic; atraumatic Eyes: pupils equal, round and reactive to light; extraocular muscles intact Neck: supple Heart: regular rate and rhythm Lungs: clear to auscultation bilaterally Chest: Mild tenderness at site of midsternal pain Abdomen: soft; nondistended; nontender; bowel  sounds present Extremities: No deformity; full range of motion; pulses normal Neurologic: Awake, alert and oriented; motor function intact in all extremities and symmetric; no facial droop Skin: Warm and dry Psychiatric: Normal mood and affect   RESULTS  Summary of this visit's results, reviewed and interpreted by myself:   EKG Interpretation  Date/Time:  Friday November 20 2019 22:39:17 EST Ventricular Rate:  93 PR Interval:  136 QRS Duration: 78 QT Interval:  384 QTC Calculation: 477 R Axis:   67 Text Interpretation: Normal sinus rhythm Nonspecific T wave abnormality Abnormal ECG No significant change was found Confirmed by Jovante Hammitt [phone]) on 11/20/2019 11:08:42 PM      Laboratory Studies: Results for orders placed or performed during the hospital encounter of 11/21/19 (from the past 24 hour(s))  CBC with Differential     Status: Abnormal   Collection Time: 11/20/19 10:37 PM  Result Value Ref Range   WBC 10.7 (H) 4.0 - 10.5 K/uL   RBC 5.19 (H) 3.87 - 5.11 MIL/uL   Hemoglobin 14.3 12.0 - 15.0 g/dL   HCT 41.1 36.0 - 46.0 %   MCV 79.2 (L) 80.0 - 100.0 fL   MCH 27.6 26.0 - 34.0 pg   MCHC 34.8 30.0 - 36.0 g/dL   RDW 14.2 11.5 - 15.5 %   Platelets 340 150 - 400 K/uL   nRBC 0.0 0.0 - 0.2 %   Neutrophils Relative % 48 %   Neutro Abs 5.3 1.7 - 7.7 K/uL   Lymphocytes Relative 39 %   Lymphs Abs 4.1 (H) 0.7 - 4.0 K/uL   Monocytes Relative 10 %   Monocytes Absolute 1.0 0.1 - 1.0 K/uL   Eosinophils Relative 2 %   Eosinophils Absolute 0.2 0.0 - 0.5 K/uL   Basophils Relative 1 %   Basophils Absolute 0.1 0.0 - 0.1 K/uL   Immature Granulocytes 0 %   Abs Immature Granulocytes 0.02 0.00 - 0.07 K/uL  Comprehensive metabolic panel     Status: Abnormal   Collection Time: 11/20/19 10:37 PM  Result Value Ref Range   Sodium 136 135 - 145 mmol/L   Potassium 3.9 3.5 - 5.1 mmol/L   Chloride 103 98 - 111 mmol/L   CO2 23 22 - 32 mmol/L   Glucose, Bld 103 (H) 70 - 99 mg/dL   BUN 8 6 -  20 mg/dL   Creatinine, Ser 0.74 0.44 - 1.00 mg/dL   Calcium 9.3 8.9 - 10.3 mg/dL   Total Protein 7.6 6.5 - 8.1 g/dL   Albumin 4.0 3.5 - 5.0 g/dL   AST 19 15 - 41 U/L   ALT 18 0 - 44 U/L   Alkaline Phosphatase 60 38 - 126 U/L   Total Bilirubin 0.7 0.3 - 1.2 mg/dL   GFR calc non Af Amer >60 >60 mL/min   GFR calc Af Amer >60 >60 mL/min   Anion gap 10 5 - 15   Imaging Studies: No results found.  ED COURSE and MDM  Nursing notes, initial and subsequent vitals signs, including pulse oximetry, reviewed and interpreted by myself.  Vitals:   11/20/19 2234 11/20/19 2237 11/21/19 0050 11/21/19 0052  BP:  (!) 172/108 (!) 156/103 (!) 155/115  Pulse:  94 81 77  Resp:  18 18 16   Temp:  98.2 F (36.8 C)    SpO2:  100% 100% 100%  Weight: 78.5 kg     Height: 5\' 5"  (1.651 m)      Medications - No data to display  Patient is asymptomatic at the present time.  Her rhythm strip was reviewed and she has had no ectopy or arrhythmias.  Her potassium is 3.9 which is reassuring.  She was advised to return if the palpitations return as it would be good to capture a potential arrhythmia while of this occurring.  PROCEDURES  Procedures   ED DIAGNOSES     ICD-10-CM   1. Palpitations  R00.2        Kea Callan, Jenny Reichmann, MD 11/21/19 [id number]

## 2019-11-24 ENCOUNTER — Other Ambulatory Visit: Payer: Self-pay | Admitting: Family Medicine

## 2019-11-25 ENCOUNTER — Encounter: Payer: Self-pay | Admitting: Family Medicine

## 2019-11-25 ENCOUNTER — Ambulatory Visit: Payer: 59 | Admitting: Family Medicine

## 2019-11-25 ENCOUNTER — Other Ambulatory Visit: Payer: Self-pay

## 2019-11-25 VITALS — BP 156/98 | HR 92 | Temp 96.8°F | Resp 16 | Ht 66.0 in | Wt 175.0 lb

## 2019-11-25 DIAGNOSIS — M2550 Pain in unspecified joint: Secondary | ICD-10-CM

## 2019-11-25 MED ORDER — METHYLPREDNISOLONE 4 MG PO TBPK: ORAL_TABLET | ORAL | 0 refills | Status: DC

## 2019-11-25 NOTE — Patient Instructions (Addendum)
Take the steroids We will call with lab results F/U as previous

## 2019-11-25 NOTE — Progress Notes (Signed)
Subjective:    Patient ID: Mary Robles, female    DOB: Aug 18, 1976, 43 y.o.   MRN: [id number]  Patient presents for Joint pain    Pt did not take norvasc this morning needs to pick this up from the pharmacy.  She was in ER on 12/5 due to palpitations.  Her blood pressure is elevated at that time but did come down when she got home.  She has not had any further palpitations.  Nothing particular was found in the ER she thought her potassium may been low. Not had any major sinus issues still has her mild drainage.  She also has her chronic GI upset  Her main concern today is her joint pain  She has generalized joint pain for the past 2 weeks. No recent illness   No injury. No rash associated with joint pain  burning sensation starts in hands then spread all over and body aches all over. She tool tylenol with minimal improvement yesterday   Feels different from her cramps that she gets  No swelling of the joints noted  At times will have cold sensaiton in her fingers  States that her knees hurt when she first gets out of bed her right when almost gave out on her a few days ago      Review Of Systems:  GEN- denies fatigue, fever, weight loss,weakness, recent illness HEENT- denies eye drainage, change in vision, nasal discharge, CVS- denies chest pain, palpitations RESP- denies SOB, cough, wheeze ABD- denies N/V, change in stools, abd pain GU- denies dysuria, hematuria, dribbling, incontinence MSK-+ joint pain, +muscle aches, injury Neuro- denies headache, dizziness, syncope, seizure activity       Objective:    BP (!) 156/98 Comment: Pt has not taken Amlodipine this am yet  Pulse 92   Temp (!) 96.8 F (36 C) (Other (Comment))   Resp 16   Ht 5\' 6"  (1.676 m)   Wt 175 lb (79.4 kg)   LMP 04/17/2014 Comment: hysterectomy w/ single oophorectomy//a.c.  SpO2 99%   BMI 28.25 kg/m  GEN- NAD, alert and oriented x3 HEENT- PERRL, EOMI, non injected sclera, pink conjunctiva, MMM,  oropharynx clear, nares clear TM clear bilaterally Neck- Supple, no lymphadenopathy CVS- RRR, no murmur RESP-CTAB Skill skeletal full range of motion upper and lower extremities normal grasp bilaterally no swelling noted in the hands or the joints normal gait EXT- No edema Pulses- Radial, DP- 2+        Assessment & Plan:      Problem List Items Addressed This Visit    None    Visit Diagnoses    Polyarthralgia    -  Primary   Polyarthralgia unknown cause.  No sign of any acute illness.  She did have mildly elevated WBC in the emergency room was nonspecific.  I will get a recheck her CBC we will also check inflammatory labs and for autoimmune disease as she has had recurrent sinusitis issues she has GI upset and now myalgias.  We will go ahead and give her Medrol Dosepak she cannot take NSAIDs.  This will give her some temporary relief.  Acetaminophen did not help.  Do not see any effusions regarding the joints today Next step would be rheumatology   Relevant Orders   CBC with Differential   Uric Acid   Sedimentation Rate   Basic metabolic panel   C-reactive protein   ANA,IFA RA Diag Pnl w/rflx Tit/Patn   Rheumatoid factor  Note: This dictation was prepared with Dragon dictation along with smaller phrase technology. Any transcriptional errors that result from this process are unintentional.

## 2019-11-27 LAB — CBC WITH DIFFERENTIAL/PLATELET
Absolute Monocytes: 533 cells/uL (ref 200–950)
Basophils Absolute: 37 cells/uL (ref 0–200)
Basophils Relative: 0.6 %
Eosinophils Absolute: 68 cells/uL (ref 15–500)
Eosinophils Relative: 1.1 %
HCT: 42.2 % (ref 35.0–45.0)
Hemoglobin: 14.2 g/dL (ref 11.7–15.5)
Lymphs Abs: 2412 cells/uL (ref 850–3900)
MCH: 28.3 pg (ref 27.0–33.0)
MCHC: 33.6 g/dL (ref 32.0–36.0)
MCV: 84.2 fL (ref 80.0–100.0)
MPV: 11.5 fL (ref 7.5–12.5)
Monocytes Relative: 8.6 %
Neutro Abs: 3150 cells/uL (ref 1500–7800)
Neutrophils Relative %: 50.8 %
Platelets: 319 10*3/uL (ref 140–400)
RBC: 5.01 10*6/uL (ref 3.80–5.10)
RDW: 14.7 % (ref 11.0–15.0)
Total Lymphocyte: 38.9 %
WBC: 6.2 10*3/uL (ref 3.8–10.8)

## 2019-11-27 LAB — BASIC METABOLIC PANEL
BUN: 7 mg/dL (ref 7–25)
CO2: 22 mmol/L (ref 20–32)
Calcium: 9.5 mg/dL (ref 8.6–10.2)
Chloride: 105 mmol/L (ref 98–110)
Creat: 0.84 mg/dL (ref 0.50–1.10)
Glucose, Bld: 83 mg/dL (ref 65–99)
Potassium: 4.7 mmol/L (ref 3.5–5.3)
Sodium: 140 mmol/L (ref 135–146)

## 2019-11-27 LAB — C-REACTIVE PROTEIN: CRP: 8.4 mg/L — ABNORMAL HIGH (ref <8.0)

## 2019-11-27 LAB — ANA,IFA RA DIAG PNL W/RFLX TIT/PATN
Anti Nuclear Antibody (ANA): NEGATIVE
Cyclic Citrullin Peptide Ab: <16 UNITS
Rhuematoid fact SerPl-aCnc: <14 IU/mL (ref <14)

## 2019-11-27 LAB — SEDIMENTATION RATE: Sed Rate: 29 mm/h — ABNORMAL HIGH (ref 0–20)

## 2019-11-27 LAB — URIC ACID: Uric Acid, Serum: 5.3 mg/dL (ref 2.5–7.0)

## 2019-11-30 ENCOUNTER — Other Ambulatory Visit: Payer: Self-pay | Admitting: *Deleted

## 2019-11-30 ENCOUNTER — Encounter: Payer: Self-pay | Admitting: Family Medicine

## 2019-11-30 DIAGNOSIS — M2550 Pain in unspecified joint: Secondary | ICD-10-CM

## 2019-11-30 DIAGNOSIS — M791 Myalgia, unspecified site: Secondary | ICD-10-CM

## 2019-11-30 DIAGNOSIS — R7982 Elevated C-reactive protein (CRP): Secondary | ICD-10-CM

## 2019-12-01 ENCOUNTER — Encounter: Payer: Self-pay | Admitting: Family Medicine

## 2019-12-01 ENCOUNTER — Other Ambulatory Visit: Payer: Self-pay

## 2019-12-01 ENCOUNTER — Ambulatory Visit (INDEPENDENT_AMBULATORY_CARE_PROVIDER_SITE_OTHER): Payer: 59 | Admitting: Family Medicine

## 2019-12-01 DIAGNOSIS — Z20828 Contact with and (suspected) exposure to other viral communicable diseases: Secondary | ICD-10-CM | POA: Diagnosis not present

## 2019-12-01 DIAGNOSIS — Z20822 Contact with and (suspected) exposure to covid-19: Secondary | ICD-10-CM

## 2019-12-01 NOTE — Progress Notes (Signed)
Virtual Visit via Telephone Note  I connected with Mary Robles on 12/01/19 at 9:36am by telephone and verified that I am speaking with the correct person using two identifiers.      Pt location: at home   Physician location:  In office, [facility], Vic Blackbird MD     On call: patient and physician   I discussed the limitations, risks, security and privacy concerns of performing an evaluation and management service by telephone and the availability of in person appointments. I also discussed with the patient that there may be a patient responsible charge related to this service. The patient expressed understanding and agreed to proceed.   History of Present Illness:  Last Wed, the health department called her, due to contact testing  She had rapid test that was negative and she had negative PCR She has been told however by the health department that she needs to continue to quarantine for total of 14 days she can return to work on the 21st.  She was supposed to be on vacation the week of the 21st but needs to return to work in order to complete some tasks.  Of note she does not have any symptoms of COVID-19 at this time.  No cough congestion no URI symptoms no fever no GI symptoms.    She was recently evaluated for myalgias which has been intermittent for her including joint pain for quite some time I do not think this is Covid related.  She has had some improvement with all over myalgias taking the prednisone. She has been referred to rheumatology for ongoing evaluation of her polyarthralgia/myalgias     Observations/Objective: No acute distress noted over the phone  Assessment and Plan: COVID-19 exposure based on health department recommendation she needs to continue to quarantine despite 2 - test until the 20th.  She can be released back to work on the 21st.  Follow Up Instructions:    I discussed the assessment and treatment plan with the patient. The patient  was provided an opportunity to ask questions and all were answered. The patient agreed with the plan and demonstrated an understanding of the instructions.   The patient was advised to call back or seek an in-person evaluation if the symptoms worsen or if the condition fails to improve as anticipated.  I provided 4 minutes of non-face-to-face time during this encounter. End time:9:40am  Vic Blackbird, MD

## 2019-12-18 DIAGNOSIS — M797 Fibromyalgia: Secondary | ICD-10-CM

## 2019-12-18 HISTORY — DX: Fibromyalgia: M79.7

## 2019-12-26 ENCOUNTER — Other Ambulatory Visit: Payer: Self-pay | Admitting: Family Medicine

## 2020-01-13 ENCOUNTER — Telehealth: Payer: Self-pay | Admitting: Obstetrics and Gynecology

## 2020-01-13 NOTE — Telephone Encounter (Signed)
Spoke with patient. Patient reports vaginal odor, discomfort and itching that started 2 days ago. Self treated with OTC monistat, no change in symptoms. Denies urinary symptoms, pelvic pain, fever/chills or bleeding.   Patient is currently in quarantine for Covid 19 exposure on 12/31/19 at work, last day of quarantine 1/29. Negative Covid 19 test on 1/19 and 1/23, no symptoms.   Advised patient OV needed for further evaluation, due to Covid 19 restriction would need to schedule after 1/29. Patient declined multiple appts offered for next week. Patient states she will just go to urgent care. Advised patient I will forward for Dr. Quincy Simmonds to review, our office will return call if any additional recommendations. Patient agreeable.   Dr. Quincy Simmonds -please review.

## 2020-01-13 NOTE — Telephone Encounter (Signed)
Encounter closed

## 2020-01-13 NOTE — Telephone Encounter (Signed)
Thank you for trying to set up an appointment for after her quarantine ends.  I agree with office evaluation.  You may close the encounter.

## 2020-01-13 NOTE — Telephone Encounter (Signed)
Patient is calling regarding vaginal discomfort. Patient is currently in quarantine until Friday, January 29.

## 2020-01-15 ENCOUNTER — Telehealth (INDEPENDENT_AMBULATORY_CARE_PROVIDER_SITE_OTHER): Payer: 59 | Admitting: Family Medicine

## 2020-01-15 ENCOUNTER — Encounter: Payer: Self-pay | Admitting: Family Medicine

## 2020-01-15 DIAGNOSIS — B9689 Other specified bacterial agents as the cause of diseases classified elsewhere: Secondary | ICD-10-CM | POA: Diagnosis not present

## 2020-01-15 DIAGNOSIS — N760 Acute vaginitis: Secondary | ICD-10-CM

## 2020-01-15 DIAGNOSIS — I10 Essential (primary) hypertension: Secondary | ICD-10-CM

## 2020-01-15 DIAGNOSIS — M2550 Pain in unspecified joint: Secondary | ICD-10-CM | POA: Diagnosis not present

## 2020-01-15 MED ORDER — METRONIDAZOLE 500 MG PO TABS: 500.0000 mg | ORAL_TABLET | Freq: Two times a day (BID) | ORAL | 0 refills | Status: DC

## 2020-01-15 MED ORDER — FLUCONAZOLE 150 MG PO TABS: 150.0000 mg | ORAL_TABLET | Freq: Once | ORAL | 0 refills | Status: AC

## 2020-01-15 NOTE — Progress Notes (Signed)
Virtual Visit via Video Note  I connected with Mary Robles on 01/15/20 at  8:15 AM EST by a video enabled telemedicine application and verified that I am speaking with the correct person using two identifiers.     Pt location: at home   Physician location:  In office, [facility], Vic Blackbird MD     On call: patient and physician   I discussed the limitations of evaluation and management by telemedicine and the availability of in person appointments. The patient expressed understanding and agreed to proceed.  History of Present Illness:  Telehealth visit secondary to patient being on COVID-19 quarantine.  She was around to coworkers who are positive for Covid.  She does not have any symptoms and is set to negative test and will complete her quarantine today.  Hypertension she has been taking her amlodipine and clonidine.  States her blood pressure this morning was 115/75.  She has not had any side effects with the medication her previous ankle swelling has resolved.  Concerned that she has a bacterial vaginal infection.  She has had these in the past.  She has an odor with itching for the past week.  No abnormal bleeding.  She did try Monistat thinking it was a yeast infection this did not help.  Also normal no urinary symptoms.  Polymyalgia/arthralgia  she was seen by rheumatology.  She was told that nothing else could be done at this time.  She was not given the diagnosis of fibromyalgia that she can recall.  I do not have the note from them so I will obtain this.  We discussed that she is not having daily pain then exercise stretching yoga will help with the overall joint pain and muscle aches instead of starting a medication such as Cymbalta or Lyrica at this time.   Observations/Objective:  NAD noted, normal WOB , well appearing  Assessment and Plan: Hypertension continue current medications.  Bacterial vaginosis presumptive diagnosis she is unable to come to the  office secondary to her quarantine for Covid.  We will go ahead and treat her with Flagyl 500 mg twice daily for 7 days we will give Diflucan to take at the end of the course of antibiotics.  If her symptoms do not improve she will be seen in the office for vaginal swabs.  Arthralgia I will obtain the note from the rheumatologist at this time we will do conservative management with exercise stretching for her muscle aches and joint pains.  Of note the prednisone did help.  Patient's were reviewed.  F/U  in office in 4 months.  Follow Up Instructions:    I discussed the assessment and treatment plan with the patient. The patient was provided an opportunity to ask questions and all were answered. The patient agreed with the plan and demonstrated an understanding of the instructions.   The patient was advised to call back or seek an in-person evaluation if the symptoms worsen or if the condition fails to improve as anticipated.  I provided  10 minutes of non-face-to-face time during this encounter. End Time  8:25am   Vic Blackbird, MD

## 2020-01-17 ENCOUNTER — Encounter: Payer: Self-pay | Admitting: Family Medicine

## 2020-01-19 ENCOUNTER — Telehealth: Payer: Self-pay | Admitting: *Deleted

## 2020-01-19 MED ORDER — DULOXETINE HCL 30 MG PO CPEP: 30.0000 mg | ORAL_CAPSULE | Freq: Every day | ORAL | 3 refills | Status: DC

## 2020-01-19 NOTE — Telephone Encounter (Signed)
Call placed to patient and patient made aware.   Agreeable to plan.   Prescription sent to pharmacy.

## 2020-01-19 NOTE — Telephone Encounter (Signed)
Received call from patient.   States that she continues to have pain and is not able to sleep at night due to this. Reports that she has tried the conservative methods PCP mentioned, and she has continued to take APAP regularly with no relief.   Reports that she is having anxiety since she has no idea what is going on in her body. States that she has had panic attacks in the past, and she feel that she is having them frequently now. Reports that she feels very overwhelmed.   Of note, states that she feels that if she could just sleep, she may feel better. Patient noted to be very teary on the call.   MD please advise.

## 2020-01-19 NOTE — Telephone Encounter (Signed)
I recommend starting Cymbalta 30mg  once a day, she can take morning or night This is for fibromyalgia pain and the depression/anxiety   OV in 4 weeks for medications

## 2020-01-27 ENCOUNTER — Other Ambulatory Visit: Payer: Self-pay | Admitting: Family Medicine

## 2020-02-16 ENCOUNTER — Other Ambulatory Visit: Payer: Self-pay

## 2020-02-16 ENCOUNTER — Encounter: Payer: Self-pay | Admitting: Family Medicine

## 2020-02-16 ENCOUNTER — Ambulatory Visit (INDEPENDENT_AMBULATORY_CARE_PROVIDER_SITE_OTHER): Payer: 59 | Admitting: Family Medicine

## 2020-02-16 VITALS — BP 122/70 | HR 90 | Temp 98.3°F | Resp 14 | Ht 66.0 in | Wt 174.0 lb

## 2020-02-16 DIAGNOSIS — J0101 Acute recurrent maxillary sinusitis: Secondary | ICD-10-CM | POA: Diagnosis not present

## 2020-02-16 DIAGNOSIS — M797 Fibromyalgia: Secondary | ICD-10-CM | POA: Diagnosis not present

## 2020-02-16 DIAGNOSIS — I10 Essential (primary) hypertension: Secondary | ICD-10-CM | POA: Diagnosis not present

## 2020-02-16 DIAGNOSIS — F411 Generalized anxiety disorder: Secondary | ICD-10-CM

## 2020-02-16 DIAGNOSIS — E782 Mixed hyperlipidemia: Secondary | ICD-10-CM

## 2020-02-16 DIAGNOSIS — F322 Major depressive disorder, single episode, severe without psychotic features: Secondary | ICD-10-CM

## 2020-02-16 MED ORDER — DULOXETINE HCL 60 MG PO CPEP: 60.0000 mg | ORAL_CAPSULE | Freq: Every day | ORAL | 2 refills | Status: DC

## 2020-02-16 MED ORDER — AMOXICILLIN-POT CLAVULANATE 875-125 MG PO TABS: 1.0000 | ORAL_TABLET | Freq: Two times a day (BID) | ORAL | 0 refills | Status: DC

## 2020-02-16 MED ORDER — FLUCONAZOLE 150 MG PO TABS: 150.0000 mg | ORAL_TABLET | Freq: Once | ORAL | 0 refills | Status: AC

## 2020-02-16 NOTE — Progress Notes (Signed)
Subjective:    Patient ID: Mary Robles, female    DOB: 04/05/1976, 44 y.o.   MRN: [id number]  Patient presents for Medication Review/ Refill (is fasting) and Sinus Issues (x5 days- sinus pressure, ear pressure, HA, gum pain, nasal drainage (clear)   Pt here to f/u medications. She has fibromyalgia She had  A panic attack at work Feb 2nd, was in pain all week with muscle aches and myalgias, couldn't sleep, was working and then was overwhelmed with reading some emails from work and began panicking clear.  He was started on Cymbalta on the same day when she called in.  This is to help with both fibromyalgia as well as her depression anxiety.  She feels she can now fall asleep easier on the Cymbalta even though she still wakes up around 430 or 5 AM but is still able to get back to sleep.  She has noticed a decrease in some of her myalgias as well.  She is trying to stay active.  And she is actually changing her job to help with her stressors.   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 She will be starting this on the 15th of March  Sinus pressure and drainage, has clear drainage, no fever cough or congestion. Singulair, xyzal and occ flonase because it dries her out, has  sinus surgery      Due for repeat lipid panel       Review Of Systems:  GEN- denies fatigue, fever, weight loss,weakness, recent illness HEENT- denies eye drainage, change in vision,+ nasal discharge, CVS- denies chest pain, palpitations RESP- denies SOB, cough, wheeze ABD- denies N/V, change in stools, abd pain GU- denies dysuria, hematuria, dribbling, incontinence MSK- denies joint pain, muscle aches, injury Neuro- denies headache, dizziness, syncope, seizure activity       Objective:    BP 122/70   Pulse 90   Temp 98.3 F (36.8 C) (Temporal)   Resp 14   Ht 5\' 6"  (1.676 m)   Wt 174 lb (78.9 kg)   LMP 04/17/2014 Comment: hysterectomy w/ single  oophorectomy//a.c.  SpO2 99%   BMI 28.08 kg/m  GEN- NAD, alert and oriented x3 HEENT- PERRL, EOMI, non injected sclera, pink conjunctiva, MMM, oropharynx clear , TM clear bilat no effusion,  + maxillary/frontal  sinus tenderness, left  inflammed turbinates,  Nasal drainage  Neck- Supple, no LAD CVS- RRR, no murmur RESP-CTAB Psych- normal affect and mood EXT- No edema Pulses- Radial 2+        Assessment & Plan:      Problem List Items Addressed This Visit      Unprioritized   Fibromyalgia    Has had work-up for myalgias.  She is also seen by rheumatology who do not see any autoimmune disorder.  We will treat her for fibromyalgia ongoing with Cymbalta this will also help with her depression anxiety.  She is already had some improvement with the medication.  We will increase her Cymbalta to 60 mg once a day.  We discussed some other stress relievers such as physical activity yoga Pilates to help stretch the muscles and keep her active.  We also discussed sleep hygiene which contributes to her mood and myalgias.  Follow-up in 2 months on medications.      Relevant Medications   DULoxetine (CYMBALTA) 60 MG capsule   GAD (generalized anxiety disorder)   Relevant Medications   DULoxetine (  CYMBALTA) 60 MG capsule   Hyperlipidemia (Chronic)   Relevant Orders   Lipid panel   Basic metabolic panel   Hypertension (Chronic)    Blood pressure is controlled no change in medication.      Relevant Orders   Lipid panel   MDD (major depressive disorder)   Relevant Medications   DULoxetine (CYMBALTA) 60 MG capsule    Other Visit Diagnoses    Acute recurrent maxillary sinusitis    -  Primary   Relevant Medications   amoxicillin-clavulanate (AUGMENTIN) 875-125 MG tablet   fluconazole (DIFLUCAN) 150 MG tablet      Note: This dictation was prepared with Dragon dictation along with smaller phrase technology. Any transcriptional errors that result from this process are unintentional.

## 2020-02-16 NOTE — Assessment & Plan Note (Signed)
Blood pressure is controlled no change in medication. 

## 2020-02-16 NOTE — Assessment & Plan Note (Signed)
Has had work-up for myalgias.  She is also seen by rheumatology who do not see any autoimmune disorder.  We will treat her for fibromyalgia ongoing with Cymbalta this will also help with her depression anxiety.  She is already had some improvement with the medication.  We will increase her Cymbalta to 60 mg once a day.  We discussed some other stress relievers such as physical activity yoga Pilates to help stretch the muscles and keep her active.  We also discussed sleep hygiene which contributes to her mood and myalgias.  Follow-up in 2 months on medications.

## 2020-02-16 NOTE — Patient Instructions (Addendum)
Increase Cymbalta to 60mg  once a day  F/U 2 months for medications

## 2020-02-17 LAB — BASIC METABOLIC PANEL
BUN/Creatinine Ratio: 7 (calc) (ref 6–22)
BUN: 6 mg/dL — ABNORMAL LOW (ref 7–25)
CO2: 26 mmol/L (ref 20–32)
Calcium: 9.2 mg/dL (ref 8.6–10.2)
Chloride: 103 mmol/L (ref 98–110)
Creat: 0.87 mg/dL (ref 0.50–1.10)
Glucose, Bld: 85 mg/dL (ref 65–99)
Potassium: 3.7 mmol/L (ref 3.5–5.3)
Sodium: 139 mmol/L (ref 135–146)

## 2020-02-17 LAB — LIPID PANEL
Cholesterol: 209 mg/dL — ABNORMAL HIGH (ref <200)
HDL: 47 mg/dL — ABNORMAL LOW (ref >=50)
LDL Cholesterol (Calc): 140 mg/dL (calc) — ABNORMAL HIGH
Non-HDL Cholesterol (Calc): 162 mg/dL (calc) — ABNORMAL HIGH (ref <130)
Total CHOL/HDL Ratio: 4.4 (calc) (ref <5.0)
Triglycerides: 103 mg/dL (ref <150)

## 2020-02-23 ENCOUNTER — Other Ambulatory Visit: Payer: Self-pay | Admitting: Family Medicine

## 2020-03-28 ENCOUNTER — Other Ambulatory Visit: Payer: Self-pay | Admitting: Family Medicine

## 2020-04-13 ENCOUNTER — Other Ambulatory Visit: Payer: Self-pay | Admitting: Family Medicine

## 2020-04-27 ENCOUNTER — Encounter: Payer: Self-pay | Admitting: Family Medicine

## 2020-04-27 ENCOUNTER — Other Ambulatory Visit: Payer: Self-pay

## 2020-04-27 ENCOUNTER — Ambulatory Visit (INDEPENDENT_AMBULATORY_CARE_PROVIDER_SITE_OTHER): Payer: 59 | Admitting: Family Medicine

## 2020-04-27 VITALS — BP 138/82 | HR 90 | Temp 98.0°F | Resp 14 | Ht 66.0 in | Wt 186.0 lb

## 2020-04-27 DIAGNOSIS — J4520 Mild intermittent asthma, uncomplicated: Secondary | ICD-10-CM

## 2020-04-27 DIAGNOSIS — M67442 Ganglion, left hand: Secondary | ICD-10-CM | POA: Diagnosis not present

## 2020-04-27 DIAGNOSIS — D229 Melanocytic nevi, unspecified: Secondary | ICD-10-CM

## 2020-04-27 DIAGNOSIS — L720 Epidermal cyst: Secondary | ICD-10-CM

## 2020-04-27 DIAGNOSIS — M797 Fibromyalgia: Secondary | ICD-10-CM

## 2020-04-27 MED ORDER — DULOXETINE HCL 60 MG PO CPEP: 60.0000 mg | ORAL_CAPSULE | Freq: Every day | ORAL | 2 refills | Status: DC

## 2020-04-27 MED ORDER — LEVOCETIRIZINE DIHYDROCHLORIDE 5 MG PO TABS: 5.0000 mg | ORAL_TABLET | Freq: Every evening | ORAL | 1 refills | Status: DC

## 2020-04-27 MED ORDER — MONTELUKAST SODIUM 10 MG PO TABS: 10.0000 mg | ORAL_TABLET | Freq: Every day | ORAL | 3 refills | Status: DC

## 2020-04-27 MED ORDER — FLUCONAZOLE 150 MG PO TABS
ORAL_TABLET | ORAL | 0 refills | Status: DC
Start: 2020-04-27 — End: 2020-05-18

## 2020-04-27 MED ORDER — FLUTICASONE PROPIONATE 50 MCG/ACT NA SUSP: 2.0000 | Freq: Every day | NASAL | 2 refills | Status: DC | PRN

## 2020-04-27 MED ORDER — CEPHALEXIN 500 MG PO CAPS
500.0000 mg | ORAL_CAPSULE | Freq: Two times a day (BID) | ORAL | 0 refills | Status: DC
Start: 2020-04-27 — End: 2020-04-27

## 2020-04-27 MED ORDER — AMLODIPINE BESYLATE 5 MG PO TABS: 5.0000 mg | ORAL_TABLET | Freq: Every day | ORAL | 1 refills | Status: DC

## 2020-04-27 NOTE — Patient Instructions (Signed)
F/u 4 months for physical  

## 2020-04-27 NOTE — Progress Notes (Signed)
Subjective:    Patient ID: Mary Robles, female    DOB: [birth year], 44 y.o.   MRN: [id number]  Patient presents for Follow-up (is fasting)  Patient here to follow-up on multiple meds.  Medications reviewed  Fibromyalgia/major depression and anxiety Cymbalta was increased to 60 mg at her visit 2 months ago.  We also discussed physical activity such as to help with sleep change in her myalgias. She stated a new job within [employer], works third shift, but she enjoys this morning   Sometimes gets jittery but feels like higher dose is helping  sleeping with weighted blanket  she has cut back on beef and fastfood, eats mostly poultry   GERD- taking protonix, still has episodes of GI upset and reflux    Hyperlipidemia- made dietary changes to help reduce cholesterol   Hypertension- taking norvasc 5mg  once a day / catapres 0.1mg    Has abscess on back that is tender, has had before, states that it has swelled up and gone down. In the past her family members have expressed pus for her. Is been present for years.  Has a nodule left hand- below middle finger and does not cause any pain but she noticed it in the past few weeks   Needs referral to dermatology to recheck her moles    Review Of Systems:  GEN- denies fatigue, fever, weight loss,weakness, recent illness HEENT- denies eye drainage, change in vision, nasal discharge, CVS- denies chest pain, palpitations RESP- denies SOB, cough, wheeze ABD- denies N/V, change in stools, abd pain GU- denies dysuria, hematuria, dribbling, incontinence MSK- denies joint pain, muscle aches, injury Neuro- denies headache, dizziness, syncope, seizure activity       Objective:    BP 138/82   Pulse 90   Temp 98 F (36.7 C) (Temporal)   Resp 14   Ht 5\' 6"  (1.676 m)   Wt 186 lb (84.4 kg)   LMP 04/17/2014 Comment: hysterectomy w/ single oophorectomy//a.c.  SpO2 99%   BMI 30.02 kg/m  GEN- NAD, alert and oriented x3 HEENT- PERRL, EOMI, non  injected sclera, pink conjunctiva, MMM, oropharynx clear Neck- Supple, no thyromegaly CVS- RRR, no murmur RESP-CTAB ABD-NABS,soft,NT,ND Psych- normala ffect and mood  Musculoskeletal left hand in the palm below the middle finger pea-sized nodule full range of motion of hand able to make a normal fist normal grasp Skin-mid back cystic lesion with black core at the center mild, to palpation no erythema Skin multiple hyperpigmented flat nevi, face, neck, trunk  EXT- No edema Pulses- Radial, DP- 2+   Procedure- Incision and Drainage Procedure explained to patient questions answered benefits and risks discussed verbal consent obtained. Antiseptic-Betadine Anesthesia-lidocaine 1% with epi  Incision no pus expressed just small amounts of blood, hard cystic lesion felt , unable to remove any particles as solid  Minimal blood loss Patient tolerated procedure well Bandage applied      Assessment & Plan:      Problem List Items Addressed This Visit      Unprioritized   Asthma, mild    Refilled singulair, allergy meds , controlled       Relevant Medications   montelukast (SINGULAIR) 10 MG tablet   Fibromyalgia    Improved with higher dose of cymbalta Improvement in her job situation has also helped anxiety and mood  No changes to dose       Relevant Medications   DULoxetine (CYMBALTA) 60 MG capsule    Other Visit Diagnoses    Multiple nevi    -  Primary   referral to dermatology    Relevant Orders   Ambulatory referral to Dermatology   Epidermoid cyst of skin       s/p I&D but not infected, cyst present for years, would need surgical removal in entirtey, pt defers at this time, after care discussed    Relevant Orders   Ambulatory referral to Dermatology   Ganglion cyst of finger of left hand       will monitor for now       Note: This dictation was prepared with Dragon dictation along with smaller phrase technology. Any transcriptional errors that result from this  process are unintentional.

## 2020-04-28 ENCOUNTER — Encounter: Payer: Self-pay | Admitting: Family Medicine

## 2020-04-28 NOTE — Assessment & Plan Note (Signed)
Refilled singulair, allergy meds , controlled

## 2020-04-28 NOTE — Assessment & Plan Note (Signed)
Improved with higher dose of cymbalta Improvement in her job situation has also helped anxiety and mood  No changes to dose

## 2020-05-18 ENCOUNTER — Ambulatory Visit: Payer: 59 | Admitting: Obstetrics and Gynecology

## 2020-05-18 ENCOUNTER — Telehealth: Payer: Self-pay | Admitting: Obstetrics and Gynecology

## 2020-05-18 ENCOUNTER — Other Ambulatory Visit: Payer: Self-pay

## 2020-05-18 ENCOUNTER — Encounter: Payer: Self-pay | Admitting: Obstetrics and Gynecology

## 2020-05-18 VITALS — BP 140/88 | HR 80 | Temp 97.6°F | Ht 65.0 in | Wt 188.0 lb

## 2020-05-18 DIAGNOSIS — R350 Frequency of micturition: Secondary | ICD-10-CM

## 2020-05-18 DIAGNOSIS — N760 Acute vaginitis: Secondary | ICD-10-CM

## 2020-05-18 DIAGNOSIS — R8290 Unspecified abnormal findings in urine: Secondary | ICD-10-CM | POA: Diagnosis not present

## 2020-05-18 NOTE — Progress Notes (Signed)
GYNECOLOGY  VISIT   HPI: 44 y.o.   Divorced  [place] American  female   [phone] with Patient's last menstrual period was 04/17/2014.   here for vaginal discharge with odor and urinary frequency and some urgency. Little bit of itching and "feels weird down there."  She tried Diflucan and this did not resolve her symptoms.   Some urgency to void and no dysuria.  No blood in her urine.   Onset of discomfort when she used a condom. Declines full STD testing.   Working at night.   Back on Cymbalta after having a panic attack.   Urine Dip: 1+WBCs, Trace RBCs  GYNECOLOGIC HISTORY: Patient's last menstrual period was 04/17/2014. Contraception: Hyst  Menopausal hormone therapy: none Last mammogram: 09-08-19 3D/Neg/density B/BiRads1 Last pap smear: 01-21-14 Neg:Neg HR HPV, 07-07-09 normal        OB History    Gravida  4   Para  2   Term      Preterm      AB  1   Living  2     SAB      TAB  1   Ectopic      Multiple      Live Births                 Patient Active Problem List   Diagnosis Date Noted  . Fibromyalgia 02/16/2020  . Cough   . Gastroesophageal reflux disease   . Heartburn   . Chest pain with low risk for cardiac etiology 09/17/2018  . DOE (dyspnea on exertion) 09/17/2018  . Obesity (BMI 30.0-34.9) 07/02/2017  . MDD (major depressive disorder) 07/16/2016  . Asthma, mild 04/11/2015  . GAD (generalized anxiety disorder) 02/08/2015  . Hyperlipidemia 07/09/2014  . Insomnia 07/09/2014  . Lipoma of abdominal wall s/p excision 05/04/2014 05/19/2014  . Status post total abdominal hysterectomy 05/04/2014  . Hypertension   . Vitamin D deficiency     Past Medical History:  Diagnosis Date  . Allergy   . Angio-edema   . Anxiety   . Asthma    no symptoms in "years", no inhaler  . Depression   . Herpes simplex   . Hiatal hernia   . Hyperlipidemia due to dietary fat intake   . Hyperlysinemia ([place])   . Hypertension   . Hypokalemia   . Sickle cell  trait ([place])   . Vitamin D deficiency     Past Surgical History:  Procedure Laterality Date  . [place] STUDY N/A 07/08/2019   Procedure: [place] STUDY;  Surgeon: Thornton Park, MD;  [facility];  Service: Gastroenterology;  Laterality: N/A;  . ABDOMINAL HYSTERECTOMY N/A 05/04/2014   Procedure: HYSTERECTOMY ABDOMINAL with BILATERAL SALPINGECTOMY ;  Surgeon: Jamey Reas de Berton Lan, MD;  Location: [facility];  Service: Gynecology;  Laterality: N/A;  . BREAST BIOPSY Right Jan. 2016   benign fibroadenoma  . ESOPHAGEAL MANOMETRY N/A 07/08/2019   Procedure: ESOPHAGEAL MANOMETRY (EM);  Surgeon: Thornton Park, MD;  [facility];  Service: Gastroenterology;  Laterality: N/A;  . FOOT SURGERY    . LAPAROTOMY N/A 05/04/2014   Procedure: REMOVAL OF ABD WALL MASS ;  Surgeon: Adin Hector, MD;  Location: [facility];  Service: General;  Laterality: N/A;  . LIPOMA EXCISION N/A 05/04/2014   Procedure: EXCISION LIPOMA of abdominal wall;  Surgeon: Jamey Reas de Berton Lan, MD;  Location: [facility];  Service: Gynecology;  Laterality: N/A;  .  NASAL SINUS SURGERY  2019  . PELVIC LAPAROSCOPY Right 2018   oophorectomy  . TUBAL LIGATION      Current Outpatient Medications  Medication Sig Dispense Refill  . acetaminophen (TYLENOL) 500 MG tablet Take 1,000 mg by mouth every 6 (six) hours as needed (for pain or headaches).    Marland Kitchen albuterol (PROVENTIL HFA;VENTOLIN HFA) 108 (90 Base) MCG/ACT inhaler Inhale 2 puffs into the lungs every 6 (six) hours as needed for wheezing or shortness of breath. 1 Inhaler 2  . amLODipine (NORVASC) 5 MG tablet Take 1 tablet (5 mg total) by mouth daily. 90 tablet 1  . cloNIDine (CATAPRES) 0.1 MG tablet Take 1 tablet (0.1 mg total) by mouth 2 (two) times daily. 180 tablet 3  . DULoxetine (CYMBALTA) 60 MG capsule Take 1 capsule (60 mg total) by mouth daily. 90 capsule 2  . EPINEPHrine (EPIPEN 2-PAK) 0.3 mg/0.3 mL IJ SOAJ injection Inject 0.3  mLs (0.3 mg total) into the muscle once as needed (for an anaphylactic reaction). 2 each 0  . fluticasone (FLONASE) 50 MCG/ACT nasal spray Place 2 sprays into both nostrils daily as needed for allergies or rhinitis. 16 g 2  . levocetirizine (XYZAL) 5 MG tablet Take 1 tablet (5 mg total) by mouth every evening. 90 tablet 1  . montelukast (SINGULAIR) 10 MG tablet Take 1 tablet (10 mg total) by mouth at bedtime. 90 tablet 3  . pantoprazole (PROTONIX) 40 MG tablet Take 1 tablet (40 mg total) by mouth 2 (two) times daily before a meal. 180 tablet 3   No current facility-administered medications for this visit.     ALLERGIES: Vicodin [hydrocodone-acetaminophen], Aldactone [spironolactone], Benazepril, Betadine [povidone iodine], Other, and Shellfish allergy  Family History  Problem Relation Age of Onset  . Diabetes Mother   . Thyroid disease Mother   . Colon polyps Mother   . Stroke Father   . Mental retardation Maternal Aunt   . Diabetes Maternal Grandmother   . Colon cancer Neg Hx   . Esophageal cancer Neg Hx   . Stomach cancer Neg Hx   . Rectal cancer Neg Hx     Social History   Socioeconomic History  . Marital status: Divorced    Spouse name: Not on file  . Number of children: 2  . Years of education: Not on file  . Highest education level: Bachelor's degree (e.g., BA, AB, BS)  Occupational History  . Occupation: Education officer, museum    Employer: [employer]  Tobacco Use  . Smoking status: Never Smoker  . Smokeless tobacco: Never Used  Substance and Sexual Activity  . Alcohol use: Not Currently    Alcohol/week: 0.0 standard drinks  . Drug use: No  . Sexual activity: Yes    Partners: Male    Birth control/protection: Surgical    Comment: BTSP 2005/TAH/BSO  Other Topics Concern  . Not on file  Social History Narrative   Divorced mother of 2.  Lives with her son.  Works in Science writer work.  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   Social Determinants of Health   Financial  Resource Strain:   . Difficulty of Paying Living Expenses:   Food Insecurity:   . Worried About Charity fundraiser in the Last Year:   . Arboriculturist in the Last Year:   Transportation Needs:   . Film/video editor (Medical):   Marland Kitchen Lack of Transportation (Non-Medical):   Physical Activity:   . Days of Exercise per Week:   .  Minutes of Exercise per Session:   Stress:   . Feeling of Stress :   Social Connections:   . Frequency of Communication with Friends and Family:   . Frequency of Social Gatherings with Friends and Family:   . Attends Religious Services:   . Active Member of Clubs or Organizations:   . Attends Archivist Meetings:   Marland Kitchen Marital Status:   Intimate Partner Violence:   . Fear of Current or Ex-Partner:   . Emotionally Abused:   Marland Kitchen Physically Abused:   . Sexually Abused:     Review of Systems  Genitourinary: Positive for frequency and urgency.  All other systems reviewed and are negative.   PHYSICAL EXAMINATION:    BP 140/88   Pulse 80   Temp 97.6 F (36.4 C) (Temporal)   Ht 5\' 5"  (1.651 m)   Wt 188 lb (85.3 kg)   LMP 04/17/2014 Comment: hysterectomy w/ single oophorectomy//a.c.  BMI 31.28 kg/m     General appearance: alert, cooperative and appears stated age   Pelvic: External genitalia:  no lesions              Urethra:  normal appearing urethra with no masses, tenderness or lesions              Bartholins and Skenes: normal                 Vagina: normal appearing vagina with normal color and white creamy discharge, no lesions              Cervix: absent                Bimanual Exam:  Uterus:  absent              Adnexa: no mass, fullness, tenderness            Chaperone was present for exam.  ASSESSMENT  Abnormal urine dip.  Vaginitis.   PLAN  She will use Triamcinolone and Nystatin in a 1:1 mixture that she already has at home.  Try latex condoms without spermicide.  Affirm and urine micro and culture.  We discussed BV  suppression with Metrogel.  She declines for now.    An After Visit Summary was printed and given to the patient.  _20_____ minutes face to face time of which over 50% was spent in counseling.

## 2020-05-18 NOTE — Telephone Encounter (Signed)
Patient has a bacterial infection. Sending to triage to assist with scheduling.

## 2020-05-18 NOTE — Telephone Encounter (Signed)
AEX 06/23/2019, next scheduled 08/03/2020 H/O UTIs with PCP  H/O +BV last 4 years  Spoke with pt. Pt reports having vaginal discharge with odor and urinary frequency x 3 days since having intercourse with condoms over weekend. Pt states has been awhile for condoms with SA.  Pt denies fever, chills, back pain, abd pain, or vaginal bleeding.  Pt states took Rx Diflucan x 1 tab on Saturday for sx. Pt states had old Rx from PCP from cyst removal at end of last month. Pt states Diflucan didn't resolve sx.   Pt advised to be seen for further evaluation. Pt agreeable. Pt scheduled with Dr Quincy Simmonds today 6/2 at 4:30 pm. Pt verbalized understanding. CPS neg.   Routing to Dr Quincy Simmonds for review.  Encounter closed.

## 2020-05-19 LAB — POCT URINALYSIS DIPSTICK
Bilirubin, UA: NEGATIVE
Glucose, UA: NEGATIVE
Ketones, UA: NEGATIVE
Nitrite, UA: NEGATIVE
Protein, UA: NEGATIVE
Urobilinogen, UA: 0.2 E.U./dL
pH, UA: 5 (ref 5.0–8.0)

## 2020-05-19 LAB — URINALYSIS, MICROSCOPIC ONLY: Casts: NONE SEEN /lpf

## 2020-05-20 ENCOUNTER — Telehealth: Payer: Self-pay

## 2020-05-20 ENCOUNTER — Other Ambulatory Visit: Payer: Self-pay

## 2020-05-20 ENCOUNTER — Encounter: Payer: Self-pay | Admitting: Obstetrics and Gynecology

## 2020-05-20 ENCOUNTER — Ambulatory Visit (INDEPENDENT_AMBULATORY_CARE_PROVIDER_SITE_OTHER): Payer: 59 | Admitting: Obstetrics and Gynecology

## 2020-05-20 VITALS — BP 128/86 | HR 76 | Temp 97.5°F | Ht 65.0 in | Wt 188.0 lb

## 2020-05-20 DIAGNOSIS — N760 Acute vaginitis: Secondary | ICD-10-CM

## 2020-05-20 LAB — URINE CULTURE: Organism ID, Bacteria: NO GROWTH

## 2020-05-20 NOTE — Progress Notes (Signed)
GYNECOLOGY  VISIT   HPI: 44 y.o.   Divorced  [place] American  female   [phone] with Patient's last menstrual period was 04/17/2014.   here for Affirm retest for vaginal discharge and odor.   The Affirm specimen was not processed due to technical difficulty in the lab that day.  UC negative.  Urine micro showed calcium oxylate crystals.   States she has right back pain for one year.  Also bilateral lower back pain.  CT 05/03/19 - no nephrolithiasis.  GYNECOLOGIC HISTORY: Patient's last menstrual period was 04/17/2014. Contraception: Hyst Menopausal hormone therapy:  none Last mammogram:  09-08-19 3D/Neg/density B/BiRads1  Last pap smear:01-21-14 Neg:Neg HR HPV, 07-07-09 normal        OB History    Gravida  4   Para  2   Term      Preterm      AB  1   Living  2     SAB      TAB  1   Ectopic      Multiple      Live Births                 Patient Active Problem List   Diagnosis Date Noted  . Fibromyalgia 02/16/2020  . Cough   . Gastroesophageal reflux disease   . Heartburn   . Chest pain with low risk for cardiac etiology 09/17/2018  . DOE (dyspnea on exertion) 09/17/2018  . Obesity (BMI 30.0-34.9) 07/02/2017  . MDD (major depressive disorder) 07/16/2016  . Asthma, mild 04/11/2015  . GAD (generalized anxiety disorder) 02/08/2015  . Hyperlipidemia 07/09/2014  . Insomnia 07/09/2014  . Lipoma of abdominal wall s/p excision 05/04/2014 05/19/2014  . Status post total abdominal hysterectomy 05/04/2014  . Hypertension   . Vitamin D deficiency     Past Medical History:  Diagnosis Date  . Allergy   . Angio-edema   . Anxiety   . Asthma    no symptoms in "years", no inhaler  . Depression   . Herpes simplex   . Hiatal hernia   . Hyperlipidemia due to dietary fat intake   . Hyperlysinemia ([place])   . Hypertension   . Hypokalemia   . Sickle cell trait ([place])   . Vitamin D deficiency     Past Surgical History:  Procedure Laterality Date  . [place]  STUDY N/A 07/08/2019   Procedure: [place] STUDY;  Surgeon: Thornton Park, MD;  [facility];  Service: Gastroenterology;  Laterality: N/A;  . ABDOMINAL HYSTERECTOMY N/A 05/04/2014   Procedure: HYSTERECTOMY ABDOMINAL with BILATERAL SALPINGECTOMY ;  Surgeon: Jamey Reas de Berton Lan, MD;  Location: [facility];  Service: Gynecology;  Laterality: N/A;  . BREAST BIOPSY Right Jan. 2016   benign fibroadenoma  . ESOPHAGEAL MANOMETRY N/A 07/08/2019   Procedure: ESOPHAGEAL MANOMETRY (EM);  Surgeon: Thornton Park, MD;  [facility];  Service: Gastroenterology;  Laterality: N/A;  . FOOT SURGERY    . LAPAROTOMY N/A 05/04/2014   Procedure: REMOVAL OF ABD WALL MASS ;  Surgeon: Adin Hector, MD;  Location: [facility];  Service: General;  Laterality: N/A;  . LIPOMA EXCISION N/A 05/04/2014   Procedure: EXCISION LIPOMA of abdominal wall;  Surgeon: Jamey Reas de Berton Lan, MD;  Location: [facility];  Service: Gynecology;  Laterality: N/A;  . NASAL SINUS SURGERY  2019  . PELVIC LAPAROSCOPY Right 2018   oophorectomy  . TUBAL LIGATION  Current Outpatient Medications  Medication Sig Dispense Refill  . acetaminophen (TYLENOL) 500 MG tablet Take 1,000 mg by mouth every 6 (six) hours as needed (for pain or headaches).    Marland Kitchen albuterol (PROVENTIL HFA;VENTOLIN HFA) 108 (90 Base) MCG/ACT inhaler Inhale 2 puffs into the lungs every 6 (six) hours as needed for wheezing or shortness of breath. 1 Inhaler 2  . amLODipine (NORVASC) 5 MG tablet Take 1 tablet (5 mg total) by mouth daily. 90 tablet 1  . cloNIDine (CATAPRES) 0.1 MG tablet Take 1 tablet (0.1 mg total) by mouth 2 (two) times daily. 180 tablet 3  . DULoxetine (CYMBALTA) 60 MG capsule Take 1 capsule (60 mg total) by mouth daily. 90 capsule 2  . EPINEPHrine (EPIPEN 2-PAK) 0.3 mg/0.3 mL IJ SOAJ injection Inject 0.3 mLs (0.3 mg total) into the muscle once as needed (for an anaphylactic reaction). 2 each 0  . fluticasone  (FLONASE) 50 MCG/ACT nasal spray Place 2 sprays into both nostrils daily as needed for allergies or rhinitis. 16 g 2  . lactobacillus acidophilus (BACID) TABS tablet Take 1 tablet by mouth daily.    Marland Kitchen levocetirizine (XYZAL) 5 MG tablet Take 1 tablet (5 mg total) by mouth every evening. 90 tablet 1  . montelukast (SINGULAIR) 10 MG tablet Take 1 tablet (10 mg total) by mouth at bedtime. 90 tablet 3  . pantoprazole (PROTONIX) 40 MG tablet Take 1 tablet (40 mg total) by mouth 2 (two) times daily before a meal. 180 tablet 3   No current facility-administered medications for this visit.     ALLERGIES: Vicodin [hydrocodone-acetaminophen], Aldactone [spironolactone], Benazepril, Betadine [povidone iodine], Other, and Shellfish allergy  Family History  Problem Relation Age of Onset  . Diabetes Mother   . Thyroid disease Mother   . Colon polyps Mother   . Stroke Father   . Mental retardation Maternal Aunt   . Diabetes Maternal Grandmother   . Colon cancer Neg Hx   . Esophageal cancer Neg Hx   . Stomach cancer Neg Hx   . Rectal cancer Neg Hx     Social History   Socioeconomic History  . Marital status: Divorced    Spouse name: Not on file  . Number of children: 2  . Years of education: Not on file  . Highest education level: Bachelor's degree (e.g., BA, AB, BS)  Occupational History  . Occupation: Education officer, museum    Employer: [employer]  Tobacco Use  . Smoking status: Never Smoker  . Smokeless tobacco: Never Used  Substance and Sexual Activity  . Alcohol use: Not Currently    Alcohol/week: 0.0 standard drinks  . Drug use: No  . Sexual activity: Yes    Partners: Male    Birth control/protection: Surgical    Comment: BTSP 2005/TAH/BSO  Other Topics Concern  . Not on file  Social History Narrative   Divorced mother of 2.  Lives with her son.  Works in Science writer work.  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   Social Determinants of Health   Financial Resource Strain:   . Difficulty  of Paying Living Expenses:   Food Insecurity:   . Worried About Charity fundraiser in the Last Year:   . Arboriculturist in the Last Year:   Transportation Needs:   . Film/video editor (Medical):   Marland Kitchen Lack of Transportation (Non-Medical):   Physical Activity:   . Days of Exercise per Week:   . Minutes of Exercise per Session:  Stress:   . Feeling of Stress :   Social Connections:   . Frequency of Communication with Friends and Family:   . Frequency of Social Gatherings with Friends and Family:   . Attends Religious Services:   . Active Member of Clubs or Organizations:   . Attends Archivist Meetings:   Marland Kitchen Marital Status:   Intimate Partner Violence:   . Fear of Current or Ex-Partner:   . Emotionally Abused:   Marland Kitchen Physically Abused:   . Sexually Abused:     Review of Systems  All other systems reviewed and are negative.   PHYSICAL EXAMINATION:    BP 128/86   Pulse 76   Temp (!) 97.5 F (36.4 C) (Temporal)   Ht 5\' 5"  (1.651 m)   Wt 188 lb (85.3 kg)   LMP 04/17/2014 Comment: hysterectomy w/ single oophorectomy//a.c.  BMI 31.28 kg/m     General appearance: alert, cooperative and appears stated age   Pelvic: External genitalia:  no lesions              Urethra:  normal appearing urethra with no masses, tenderness or lesions              Bartholins and Skenes: normal                 Vagina: normal appearing vagina with normal color and discharge, no lesions              Cervix: absent                Bimanual Exam:  Uterus:  absent              Adnexa: no mass, fullness, tenderness           Chaperone was present for exam.  ASSESSMENT  Vaginitis.  Negative UC.  Calcium oxylate crystals present.  PLAN  Affirm sent again.  Tx plan to follow.  We discussed crystals as a risk factor for renal stones.  Hydrate well.  FU prn.

## 2020-05-20 NOTE — Telephone Encounter (Signed)
Spoke with patient advised patient that affirm testing was unable to be processed due to an error with the lab. Offered the patient retesting due to symptoms. Patient would like to return to the office today at 12 pm for testing with Dr.Silva. Appointment scheduled. We are discussing the error with the lab and are very sorry for this. Patient is understanding.  Routing to provider and will close encounter.

## 2020-05-21 LAB — VAGINITIS/VAGINOSIS, DNA PROBE
Candida Species: NEGATIVE
Gardnerella vaginalis: POSITIVE — AB
Trichomonas vaginosis: NEGATIVE

## 2020-05-21 MED ORDER — METRONIDAZOLE 500 MG PO TABS
500.0000 mg | ORAL_TABLET | Freq: Two times a day (BID) | ORAL | 0 refills | Status: DC
Start: 2020-05-21 — End: 2020-08-03

## 2020-05-21 NOTE — Addendum Note (Signed)
Addended by: Yisroel Ramming, Dietrich Pates E on: 05/21/2020 07:03 PM   Modules accepted: Orders

## 2020-07-28 NOTE — Progress Notes (Signed)
44 y.o. [id number] Divorced [place] American female here for annual exam.    Having hot flashes.  Tolerating it ok.   Treated for BV in June, 2021. Symptoms are gone.   Declines STD testing.   Taking Cymbalta for fibromyalgia and panic attack.  Took in the past.   Not done Covid vaccine.  Not interested.  PCP:  Vic Blackbird, MD  Patient's last menstrual period was 04/17/2014.           Sexually active: Yes.    The current method of family planning is status post hysterectomy.    Exercising: No.  The patient does not participate in regular exercise at present. Smoker:  no  Health Maintenance: Pap: 01-20-14 Neg:Neg HR HPV History of abnormal Pap:  Yes, hx of dysplasia and cryotherapy to cervix in 2000 MMG: 09-08-19 3D/Neg/density B/Birads1 Colonoscopy:  n/a BMD:   n/a  Result  n/a TDaP:  2013 Gardasil:   no HIV: Neg 2017 Hep C: Neg 2017 Screening Labs:  PCP - will see next month.    reports that she has never smoked. She has never used smokeless tobacco. She reports previous alcohol use. She reports that she does not use drugs.  Past Medical History:  Diagnosis Date  . Allergy   . Angio-edema   . Anxiety   . Asthma    no symptoms in "years", no inhaler  . Depression   . Fibromyalgia 2021  . Herpes simplex   . Hiatal hernia   . Hyperlipidemia due to dietary fat intake   . Hyperlysinemia ([place])   . Hypertension   . Hypokalemia   . Sickle cell trait ([place])   . Vitamin D deficiency     Past Surgical History:  Procedure Laterality Date  . [place] STUDY N/A 07/08/2019   Procedure: [place] STUDY;  Surgeon: Thornton Park, MD;  [facility];  Service: Gastroenterology;  Laterality: N/A;  . ABDOMINAL HYSTERECTOMY N/A 05/04/2014   Procedure: HYSTERECTOMY ABDOMINAL with BILATERAL SALPINGECTOMY ;  Surgeon: Jamey Reas de Berton Lan, MD;  Location: [facility];  Service: Gynecology;  Laterality: N/A;  . BREAST BIOPSY Right Jan. 2016   benign  fibroadenoma  . ESOPHAGEAL MANOMETRY N/A 07/08/2019   Procedure: ESOPHAGEAL MANOMETRY (EM);  Surgeon: Thornton Park, MD;  [facility];  Service: Gastroenterology;  Laterality: N/A;  . FOOT SURGERY    . LAPAROTOMY N/A 05/04/2014   Procedure: REMOVAL OF ABD WALL MASS ;  Surgeon: Adin Hector, MD;  Location: [facility];  Service: General;  Laterality: N/A;  . LIPOMA EXCISION N/A 05/04/2014   Procedure: EXCISION LIPOMA of abdominal wall;  Surgeon: Jamey Reas de Berton Lan, MD;  Location: [facility];  Service: Gynecology;  Laterality: N/A;  . NASAL SINUS SURGERY  2019  . PELVIC LAPAROSCOPY Right 2018   oophorectomy  . TUBAL LIGATION      Current Outpatient Medications  Medication Sig Dispense Refill  . acetaminophen (TYLENOL) 500 MG tablet Take 1,000 mg by mouth every 6 (six) hours as needed (for pain or headaches).    Marland Kitchen albuterol (PROVENTIL HFA;VENTOLIN HFA) 108 (90 Base) MCG/ACT inhaler Inhale 2 puffs into the lungs every 6 (six) hours as needed for wheezing or shortness of breath. 1 Inhaler 2  . amLODipine (NORVASC) 5 MG tablet Take 1 tablet (5 mg total) by mouth daily. 90 tablet 1  . cloNIDine (CATAPRES) 0.1 MG tablet Take 1 tablet by mouth twice daily 60 tablet 0  .  DULoxetine (CYMBALTA) 60 MG capsule Take 1 capsule (60 mg total) by mouth daily. 90 capsule 2  . EPINEPHrine (EPIPEN 2-PAK) 0.3 mg/0.3 mL IJ SOAJ injection Inject 0.3 mLs (0.3 mg total) into the muscle once as needed (for an anaphylactic reaction). 2 each 0  . fluticasone (FLONASE) 50 MCG/ACT nasal spray Place 2 sprays into both nostrils daily as needed for allergies or rhinitis. 16 g 2  . levocetirizine (XYZAL) 5 MG tablet Take 1 tablet (5 mg total) by mouth every evening. 90 tablet 1  . montelukast (SINGULAIR) 10 MG tablet Take 1 tablet (10 mg total) by mouth at bedtime. 90 tablet 3  . pantoprazole (PROTONIX) 40 MG tablet Take 1 tablet (40 mg total) by mouth 2 (two) times daily before a meal. 180 tablet 3    No current facility-administered medications for this visit.    Family History  Problem Relation Age of Onset  . Diabetes Mother   . Thyroid disease Mother   . Colon polyps Mother   . Stroke Father   . Mental retardation Maternal Aunt   . Diabetes Maternal Grandmother   . Colon cancer Neg Hx   . Esophageal cancer Neg Hx   . Stomach cancer Neg Hx   . Rectal cancer Neg Hx     Review of Systems  All other systems reviewed and are negative.   Exam:   BP 122/78 (Cuff Size: Large)   Pulse 76   Resp 16   Ht 5\' 6"  (1.676 m)   Wt 193 lb (87.5 kg)   LMP 04/17/2014 Comment: hysterectomy w/ single oophorectomy//a.c.  BMI 31.15 kg/m     General appearance: alert, cooperative and appears stated age Head: normocephalic, without obvious abnormality, atraumatic Neck: no adenopathy, supple, symmetrical, trachea midline and thyroid normal to inspection and palpation Lungs: clear to auscultation bilaterally Breasts: normal appearance, no masses or tenderness, No nipple retraction or dimpling, No nipple discharge or bleeding, No axillary adenopathy Heart: regular rate and rhythm Abdomen: soft, non-tender; no masses, no organomegaly Extremities: extremities normal, atraumatic, no cyanosis or edema Skin: skin color, texture, turgor normal. No rashes or lesions Lymph nodes: cervical, supraclavicular, and axillary nodes normal. Neurologic: grossly normal  Pelvic: External genitalia:  no lesions              No abnormal inguinal nodes palpated.              Urethra:  normal appearing urethra with no masses, tenderness or lesions              Bartholins and Skenes: normal                 Vagina: normal appearing vagina with normal color and discharge, no lesions              Cervix: absent              Pap taken: No. Bimanual Exam:  Uterus:  absent              Adnexa: no mass, fullness, tenderness              Rectal exam: Yes.  .  Confirms.              Anus:  normal sphincter tone, no  lesions  Chaperone was present for exam.  Assessment:   Well woman visit with normal exam. Status post TAH/bilateral salpingectomy/excision of abdominal wall lipoma. Statu post laparoscopic right oophorectomy/ecisionof epiploic mass. Left ovary remains.  Hx HSV.  Genital.  Fibromyalgia.  On Cymbalta.  Plan: Mammogram screening discussed. Self breast awareness reviewed. Pap and HR HPV as above. Guidelines for Calcium, Vitamin D, regular exercise program including cardiovascular and weight bearing exercise. Start IFOB next year.  Follow up annually and prn.   After visit summary provided.

## 2020-07-30 ENCOUNTER — Other Ambulatory Visit: Payer: Self-pay | Admitting: Family Medicine

## 2020-08-03 ENCOUNTER — Other Ambulatory Visit: Payer: Self-pay

## 2020-08-03 ENCOUNTER — Encounter: Payer: Self-pay | Admitting: Obstetrics and Gynecology

## 2020-08-03 ENCOUNTER — Ambulatory Visit: Payer: 59 | Admitting: Obstetrics and Gynecology

## 2020-08-03 VITALS — BP 122/78 | HR 76 | Resp 16 | Ht 66.0 in | Wt 193.0 lb

## 2020-08-03 DIAGNOSIS — Z01419 Encounter for gynecological examination (general) (routine) without abnormal findings: Secondary | ICD-10-CM

## 2020-08-03 NOTE — Patient Instructions (Signed)

## 2020-08-11 ENCOUNTER — Other Ambulatory Visit: Payer: Self-pay | Admitting: Family Medicine

## 2020-08-31 ENCOUNTER — Encounter: Payer: Self-pay | Admitting: Family Medicine

## 2020-08-31 ENCOUNTER — Ambulatory Visit (INDEPENDENT_AMBULATORY_CARE_PROVIDER_SITE_OTHER): Payer: 59 | Admitting: Family Medicine

## 2020-08-31 ENCOUNTER — Other Ambulatory Visit: Payer: Self-pay

## 2020-08-31 VITALS — BP 122/64 | HR 66 | Temp 98.1°F | Resp 14 | Ht 66.0 in | Wt 194.0 lb

## 2020-08-31 DIAGNOSIS — I10 Essential (primary) hypertension: Secondary | ICD-10-CM

## 2020-08-31 DIAGNOSIS — E559 Vitamin D deficiency, unspecified: Secondary | ICD-10-CM

## 2020-08-31 DIAGNOSIS — Z0184 Encounter for antibody response examination: Secondary | ICD-10-CM

## 2020-08-31 DIAGNOSIS — Z0001 Encounter for general adult medical examination with abnormal findings: Secondary | ICD-10-CM | POA: Diagnosis not present

## 2020-08-31 DIAGNOSIS — J4520 Mild intermittent asthma, uncomplicated: Secondary | ICD-10-CM

## 2020-08-31 DIAGNOSIS — E669 Obesity, unspecified: Secondary | ICD-10-CM

## 2020-08-31 DIAGNOSIS — F411 Generalized anxiety disorder: Secondary | ICD-10-CM

## 2020-08-31 DIAGNOSIS — Z1231 Encounter for screening mammogram for malignant neoplasm of breast: Secondary | ICD-10-CM

## 2020-08-31 DIAGNOSIS — Z0000 Encounter for general adult medical examination without abnormal findings: Secondary | ICD-10-CM

## 2020-08-31 DIAGNOSIS — K219 Gastro-esophageal reflux disease without esophagitis: Secondary | ICD-10-CM

## 2020-08-31 DIAGNOSIS — E782 Mixed hyperlipidemia: Secondary | ICD-10-CM

## 2020-08-31 MED ORDER — DULOXETINE HCL 30 MG PO CPEP
30.0000 mg | ORAL_CAPSULE | Freq: Every day | ORAL | 1 refills | Status: DC
Start: 2020-08-31 — End: 2020-10-24

## 2020-08-31 MED ORDER — CLONIDINE HCL 0.1 MG PO TABS
0.1000 mg | ORAL_TABLET | Freq: Two times a day (BID) | ORAL | 1 refills | Status: DC
Start: 2020-08-31 — End: 2021-02-24

## 2020-08-31 MED ORDER — AMLODIPINE BESYLATE 5 MG PO TABS
5.0000 mg | ORAL_TABLET | Freq: Every day | ORAL | 1 refills | Status: DC
Start: 2020-08-31 — End: 2020-10-24

## 2020-08-31 MED ORDER — MONTELUKAST SODIUM 10 MG PO TABS: 10.0000 mg | ORAL_TABLET | Freq: Every day | ORAL | 3 refills | Status: DC

## 2020-08-31 MED ORDER — LEVOCETIRIZINE DIHYDROCHLORIDE 5 MG PO TABS
5.0000 mg | ORAL_TABLET | Freq: Every evening | ORAL | 1 refills | Status: DC
Start: 2020-08-31 — End: 2021-02-24

## 2020-08-31 MED ORDER — FLUCONAZOLE 150 MG PO TABS: 150.0000 mg | ORAL_TABLET | Freq: Once | ORAL | 0 refills | Status: AC

## 2020-08-31 MED ORDER — AMOXICILLIN-POT CLAVULANATE 875-125 MG PO TABS
1.0000 | ORAL_TABLET | Freq: Two times a day (BID) | ORAL | 0 refills | Status: DC
Start: 2020-08-31 — End: 2021-02-24

## 2020-08-31 MED ORDER — PANTOPRAZOLE SODIUM 40 MG PO TBEC
DELAYED_RELEASE_TABLET | ORAL | 1 refills | Status: DC
Start: 2020-08-31 — End: 2021-02-24

## 2020-08-31 NOTE — Assessment & Plan Note (Signed)
continued on Protonix She has missed days and has had severe symptoms

## 2020-08-31 NOTE — Patient Instructions (Addendum)
F/U 6 months  We will call with lab results Schedule your mammogram

## 2020-08-31 NOTE — Assessment & Plan Note (Signed)
Controlled no changes  Discussed reducing fast foot, salt in diet She is at risk for CAD Will recheck fasting labs

## 2020-08-31 NOTE — Assessment & Plan Note (Signed)
Pt requested lowering of medicine Finds herself jerking in sleep and it makes her sleepy Feels anxiety is controlled but doesn't want to stop completely This also helps with fibromyalgia symptoms  Reduce Cymbalta to 30mg 

## 2020-08-31 NOTE — Assessment & Plan Note (Signed)
Check vitamin D level 

## 2020-08-31 NOTE — Assessment & Plan Note (Addendum)
No recent exacerbations Has acute sinuitis - will treat with augmentin, diflucan due to yeast infection Continue singulair and flonase  Recommend covid-19 vaccine and flu shot she declines

## 2020-08-31 NOTE — Progress Notes (Signed)
Subjective:    Patient ID: Mary Robles, female    DOB: Aug 29, 1976, 44 y.o.   MRN: [id number]  Patient presents for Annual Exam (is fasting) and Sinus Pressure (pressure in forehead, nasal bridge and under eyes)  Pt here for CPE  Medications and history reviewed  History of recurrent sinusitis, has seen ENT, maintained on flonase, xyzal , past 2-3 weeks worsening sinus pressure, drainage, post nasal drip, no fever , no chills  She has not had COVID-19 vaccine She is tested regularly with work   Due for mammogram  GYN UTD- Dr. Judeth Horn   Hyperlioidemia due for repeat lipid panel,trying to control with diet   HTN taking meds as prescribed  Obesity- she admits to eating fastfood a lot, ice cream at night, some fruits and veggies   Eye doctor UTD wears glasses Dnetist UTD    Review Of Systems:  GEN- denies fatigue, fever, weight loss,weakness, recent illness HEENT- denies eye drainage, change in vision, nasal discharge, CVS- denies chest pain, palpitations RESP- denies SOB, cough, wheeze ABD- denies N/V, change in stools, abd pain GU- denies dysuria, hematuria, dribbling, incontinence MSK- denies joint pain, muscle aches, injury Neuro- denies headache, dizziness, syncope, seizure activity       Objective:    BP 122/64   Pulse 66   Temp 98.1 F (36.7 C) (Temporal)   Resp 14   Ht 5\' 6"  (1.676 m)   Wt 194 lb (88 kg)   LMP 04/17/2014 Comment: hysterectomy w/ single oophorectomy//a.c.  SpO2 97%   BMI 31.31 kg/m  GEN- NAD, alert and oriented x3 HEENT- PERRL, EOMI, non injected sclera, pink conjunctiva, MMM, oropharynx clear, TM clear bilat no effusion,  + maxillary/ethoid sinus tenderness, inflammed turbinates,  Nasal drainage  Neck- Supple, no LAD CVS- RRR, no murmur RESP-CTAB ABD-NABS,soft,NT,ND Psych- normal affect and mood , PHQ 9 score 1  EXT- No edema Pulses- Radial 2+         Assessment & Plan:      Problem List Items Addressed This Visit       Unprioritized   Asthma, mild    No recent exacerbations Has acute sinuitis - will treat with augmentin, diflucan due to yeast infection Continue singulair and flonase  Recommend covid-19 vaccine and flu shot she declines       Relevant Medications   montelukast (SINGULAIR) 10 MG tablet   GAD (generalized anxiety disorder)    Pt requested lowering of medicine Finds herself jerking in sleep and it makes her sleepy Feels anxiety is controlled but doesn't want to stop completely This also helps with fibromyalgia symptoms  Reduce Cymbalta to 30mg       Relevant Medications   DULoxetine (CYMBALTA) 30 MG capsule   Gastroesophageal reflux disease    continued on Protonix She has missed days and has had severe symptoms       Relevant Medications   pantoprazole (PROTONIX) 40 MG tablet   Hyperlipidemia (Chronic)   Relevant Medications   amLODipine (NORVASC) 5 MG tablet   cloNIDine (CATAPRES) 0.1 MG tablet   Other Relevant Orders   Lipid panel   Hypertension (Chronic)    Controlled no changes  Discussed reducing fast foot, salt in diet She is at risk for CAD Will recheck fasting labs      Relevant Medications   amLODipine (NORVASC) 5 MG tablet   cloNIDine (CATAPRES) 0.1 MG tablet   Other Relevant Orders   CBC with Differential/Platelet   Comprehensive metabolic panel  Obesity (BMI 30.0-34.9)   Relevant Orders   Hemoglobin A1c   Vitamin D deficiency    Check vitamin D level       Relevant Orders   Vitamin D, 25-hydroxy    Other Visit Diagnoses    Routine general medical examination at a health care facility    -  Primary   CPE done, fasting labs, pt to schedule mammo   Encounter for screening mammogram for malignant neoplasm of breast       Relevant Orders   MM 3D SCREEN BREAST BILATERAL   Immunity status testing       Relevant Orders   SAR CoV2 Serology (COVID 19)AB(IGG)IA      Note: This dictation was prepared with Dragon dictation along with smaller phrase  technology. Any transcriptional errors that result from this process are unintentional.

## 2020-09-01 LAB — CBC WITH DIFFERENTIAL/PLATELET
Absolute Monocytes: 791 cells/uL (ref 200–950)
Basophils Absolute: 68 cells/uL (ref 0–200)
Basophils Relative: 0.8 %
Eosinophils Absolute: 272 cells/uL (ref 15–500)
Eosinophils Relative: 3.2 %
HCT: 40.4 % (ref 35.0–45.0)
Hemoglobin: 13.5 g/dL (ref 11.7–15.5)
Lymphs Abs: 3188 cells/uL (ref 850–3900)
MCH: 28.1 pg (ref 27.0–33.0)
MCHC: 33.4 g/dL (ref 32.0–36.0)
MCV: 84 fL (ref 80.0–100.0)
MPV: 10.4 fL (ref 7.5–12.5)
Monocytes Relative: 9.3 %
Neutro Abs: 4182 cells/uL (ref 1500–7800)
Neutrophils Relative %: 49.2 %
Platelets: 361 10*3/uL (ref 140–400)
RBC: 4.81 10*6/uL (ref 3.80–5.10)
RDW: 14.8 % (ref 11.0–15.0)
Total Lymphocyte: 37.5 %
WBC: 8.5 10*3/uL (ref 3.8–10.8)

## 2020-09-01 LAB — COMPREHENSIVE METABOLIC PANEL
AG Ratio: 1.5 (calc) (ref 1.0–2.5)
ALT: 16 U/L (ref 6–29)
AST: 14 U/L (ref 10–30)
Albumin: 3.8 g/dL (ref 3.6–5.1)
Alkaline phosphatase (APISO): 59 U/L (ref 31–125)
BUN: 7 mg/dL (ref 7–25)
CO2: 25 mmol/L (ref 20–32)
Calcium: 9.1 mg/dL (ref 8.6–10.2)
Chloride: 103 mmol/L (ref 98–110)
Creat: 0.98 mg/dL (ref 0.50–1.10)
Globulin: 2.6 g/dL (calc) (ref 1.9–3.7)
Glucose, Bld: 95 mg/dL (ref 65–99)
Potassium: 4.2 mmol/L (ref 3.5–5.3)
Sodium: 138 mmol/L (ref 135–146)
Total Bilirubin: 0.6 mg/dL (ref 0.2–1.2)
Total Protein: 6.4 g/dL (ref 6.1–8.1)

## 2020-09-01 LAB — HEMOGLOBIN A1C
Hgb A1c MFr Bld: 5.4 % of total Hgb (ref <5.7)
Mean Plasma Glucose: 108 (calc)
eAG (mmol/L): 6 (calc)

## 2020-09-01 LAB — LIPID PANEL
Cholesterol: 180 mg/dL (ref <200)
HDL: 40 mg/dL — ABNORMAL LOW (ref >=50)
LDL Cholesterol (Calc): 121 mg/dL (calc) — ABNORMAL HIGH
Non-HDL Cholesterol (Calc): 140 mg/dL (calc) — ABNORMAL HIGH (ref <130)
Total CHOL/HDL Ratio: 4.5 (calc) (ref <5.0)
Triglycerides: 90 mg/dL (ref <150)

## 2020-09-01 LAB — VITAMIN D 25 HYDROXY (VIT D DEFICIENCY, FRACTURES): Vit D, 25-Hydroxy: 21 ng/mL — ABNORMAL LOW (ref 30–100)

## 2020-09-01 LAB — SARS-COV-2 ANTIBODY(IGG)SPIKE,SEMI-QUANTITATIVE: SARS COV1 AB(IGG)SPIKE,SEMI QN: <1 index (ref <1.00)

## 2020-09-02 ENCOUNTER — Other Ambulatory Visit: Payer: Self-pay | Admitting: *Deleted

## 2020-09-02 MED ORDER — VITAMIN D (ERGOCALCIFEROL) 1.25 MG (50000 UNIT) PO CAPS: 50000.0000 [IU] | ORAL_CAPSULE | ORAL | 1 refills | Status: DC

## 2020-09-09 IMAGING — CT CT ABDOMEN AND PELVIS WITH CONTRAST
2 of 5 series · 16 of 46 positions shown, 18 images · IV contrast (APPLIED)
Comparison: 01/28/2013

CLINICAL DATA: Right upper quadrant pain

EXAM:
CT ABDOMEN AND PELVIS WITH CONTRAST
TECHNIQUE: Multidetector CT imaging of the abdomen and pelvis was performed
using the standard protocol following bolus administration of
intravenous contrast.
CONTRAST:  100mL OMNIPAQUE IOHEXOL 300 MG/ML  SOLN

[Series 2: axial st · axial · 0.70mm/px · z∈[+565,+945]mm · 13 of 86 slices shown, 15 images]
[im 5/86  soft-tissue]
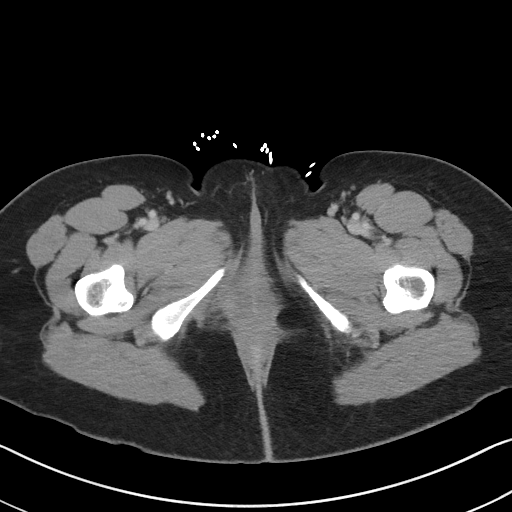
[im 5/86  bone]
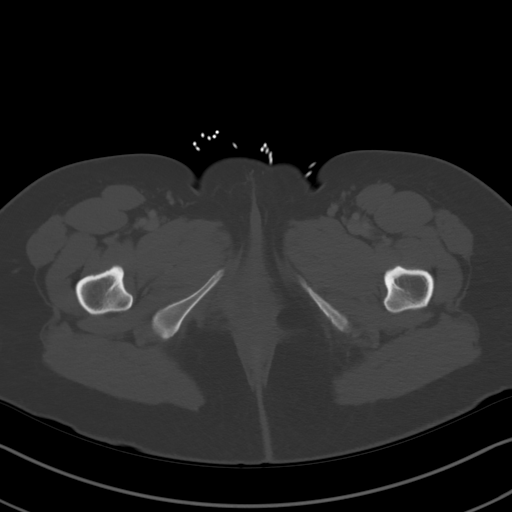
[im 10/86  soft-tissue]
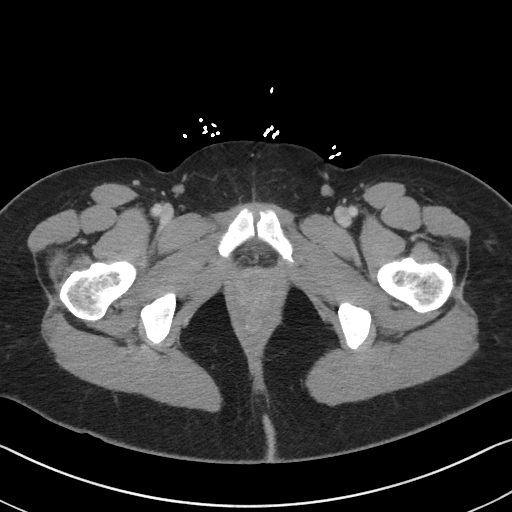
[im 19/86  soft-tissue]
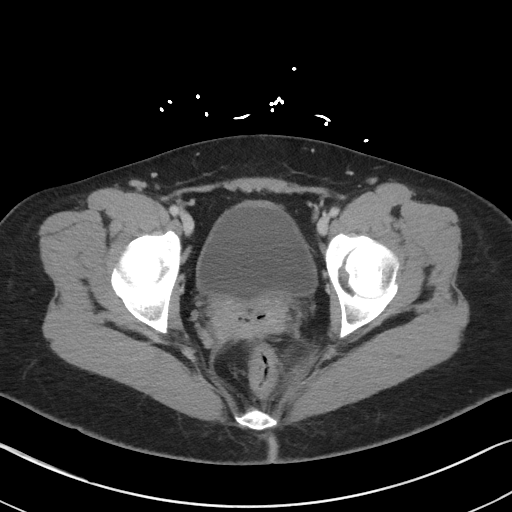
[im 24/86  soft-tissue]
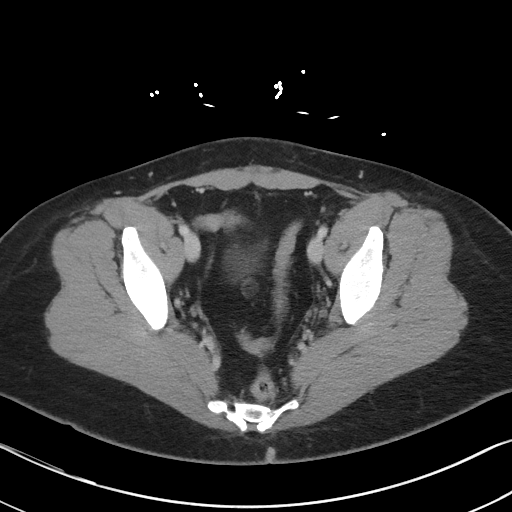
[im 29/86  soft-tissue]
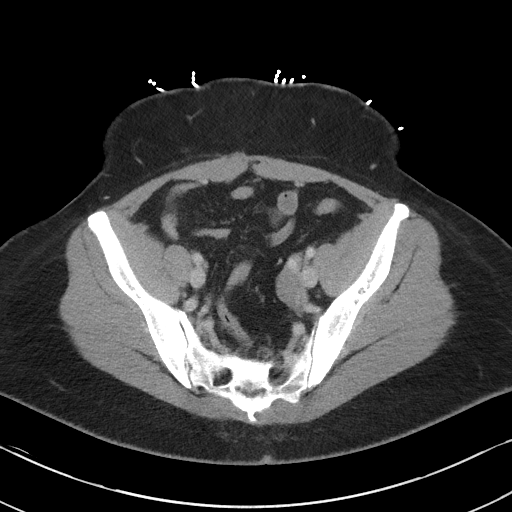
[im 38/86  soft-tissue]
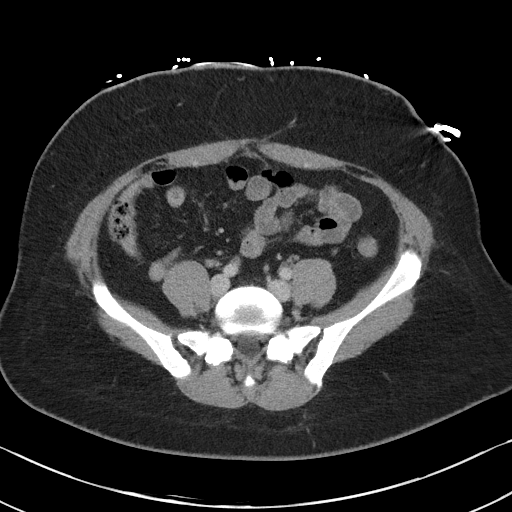
[im 43/86  soft-tissue]
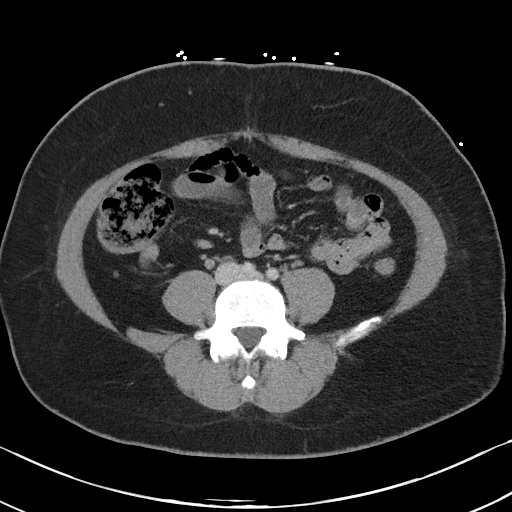
[im 48/86  soft-tissue]
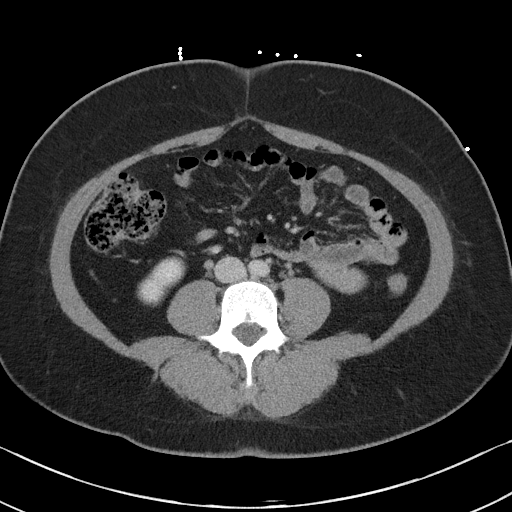
[im 57/86  soft-tissue]
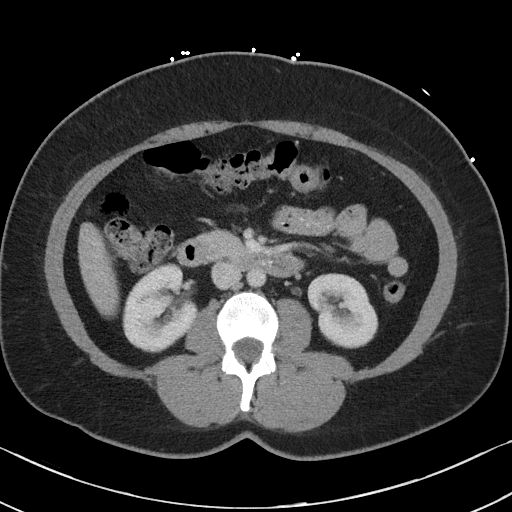
[im 57/86  bone]
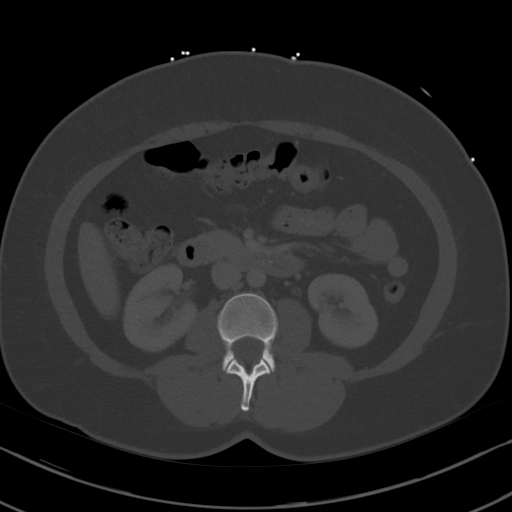
[im 62/86  soft-tissue]
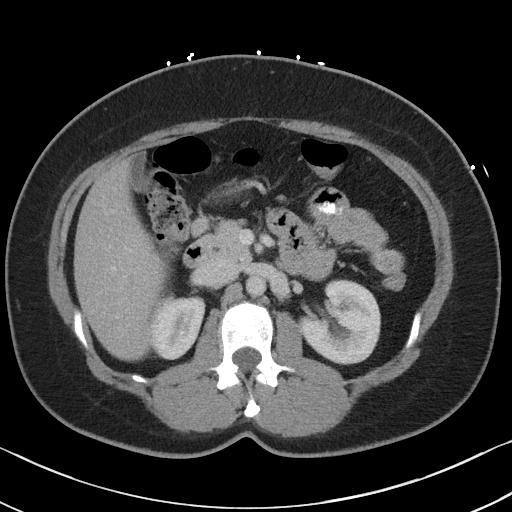
[im 67/86  soft-tissue]
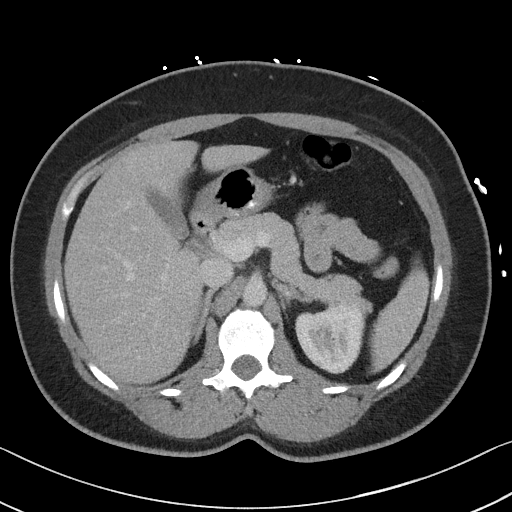
[im 76/86  soft-tissue]
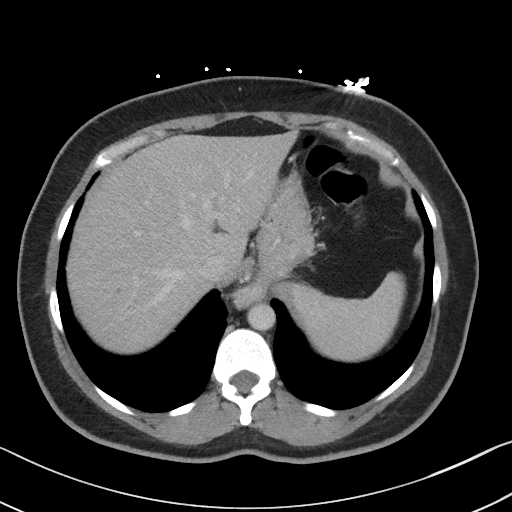
[im 81/86  soft-tissue]
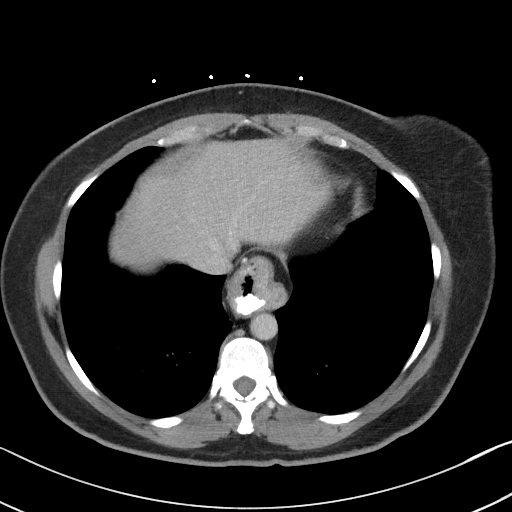

[Series 5: coronal st · coronal · 0.69mm/px · 3 of 101 slices shown]
[im 34/101  soft-tissue]
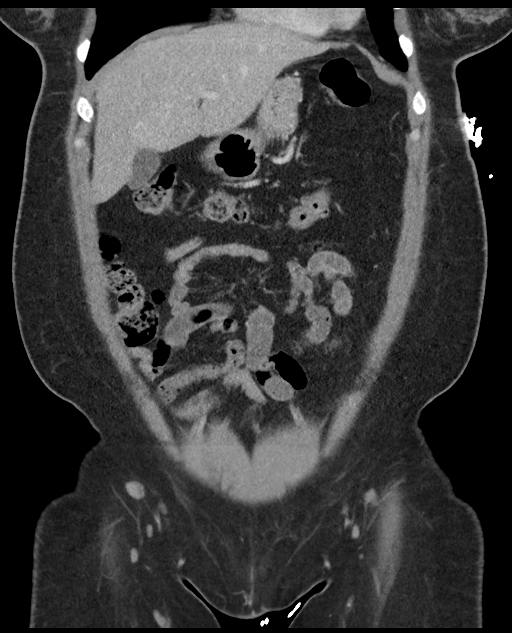
[im 45/101  soft-tissue]
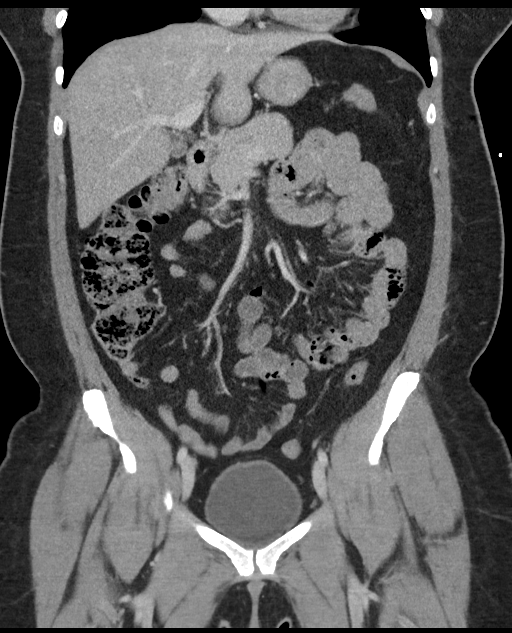
[im 56/101  soft-tissue]
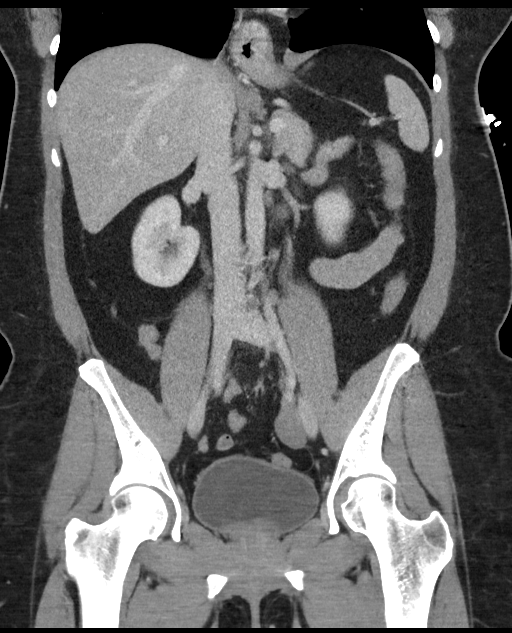

[16 of 46 positions shown; findings below may reference images not displayed]

FINDINGS: Lower chest: No acute abnormality.  Small hiatal hernia.

Hepatobiliary: No focal liver abnormality is seen. No gallstones,
gallbladder wall thickening, or biliary dilatation.

Pancreas: Unremarkable. No pancreatic ductal dilatation or
surrounding inflammatory changes.

Spleen: Normal in size without focal abnormality.

Adrenals/Urinary Tract: Adrenal glands are unremarkable. Kidneys are
normal, without renal calculi, focal lesion, or hydronephrosis.
Bladder is unremarkable.

Stomach/Bowel: Stomach is within normal limits. Appendix appears
normal. No evidence of bowel wall thickening, distention, or
inflammatory changes.

Vascular/Lymphatic: No significant vascular findings are present. No
enlarged abdominal or pelvic lymph nodes.

Reproductive: Status post hysterectomy.

Other: No abdominal wall hernia or abnormality. No abdominopelvic
ascites.

Musculoskeletal: No acute or significant osseous findings.
IMPRESSION: 1.  No CT findings of the right upper quadrant to explain pain.

2.  Small hiatal hernia.

## 2020-09-09 IMAGING — US ULTRASOUND ABDOMEN LIMITED
1 series · 14 of 25 positions shown · non-contrast
Comparison: None.

CLINICAL DATA: One month history of postprandial abdominal pain,
nausea and bloating.

EXAM:
ULTRASOUND ABDOMEN LIMITED RIGHT UPPER QUADRANT

[Series 1: ultrasound abdomen limited · 14 of 45 slices shown]
[im 1/45]
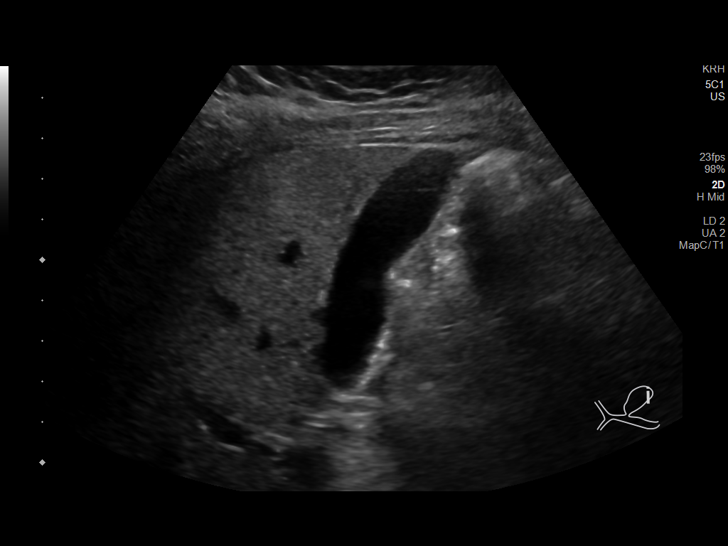
[im 4/45]
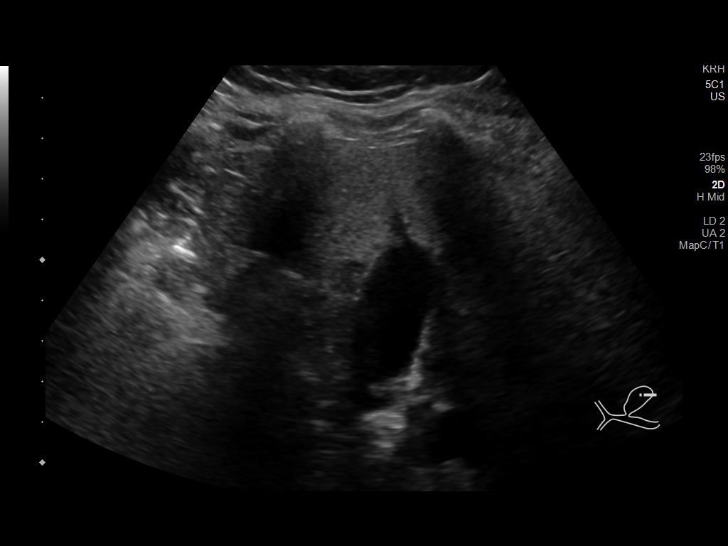
[im 8/45]
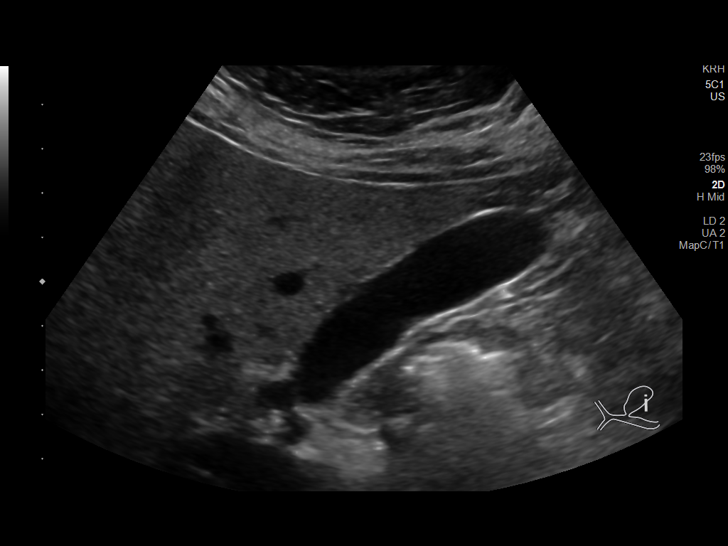
[im 12/45]
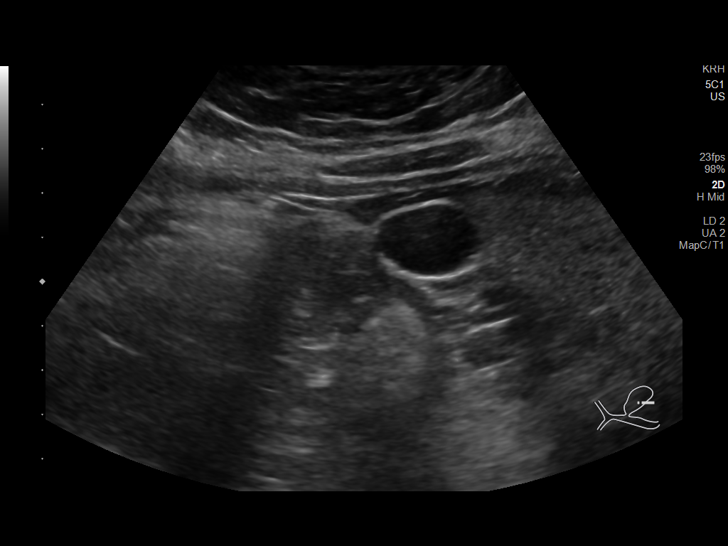
[im 15/45]
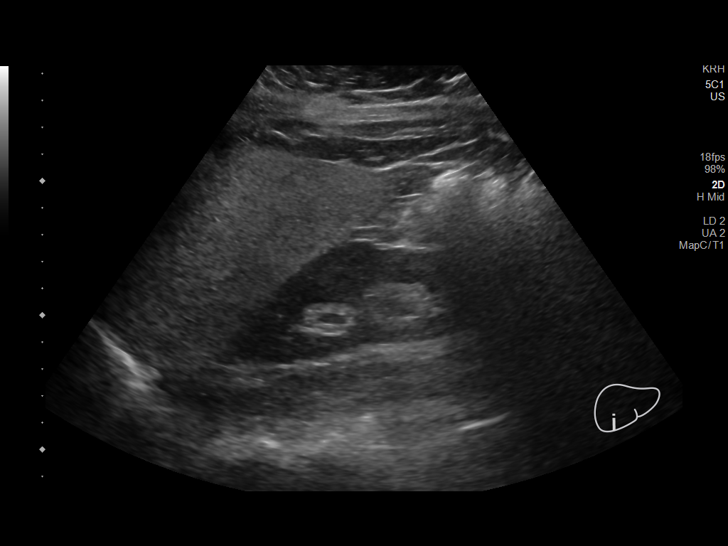
[im 17/45]
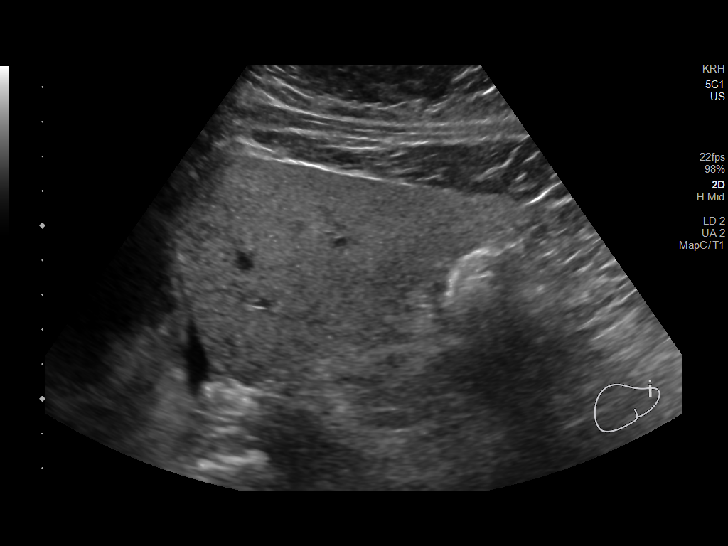
[im 21/45]
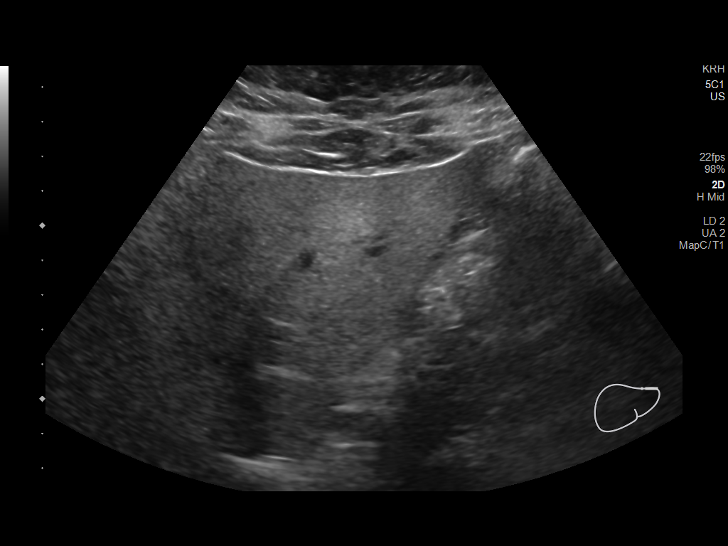
[im 24/45]
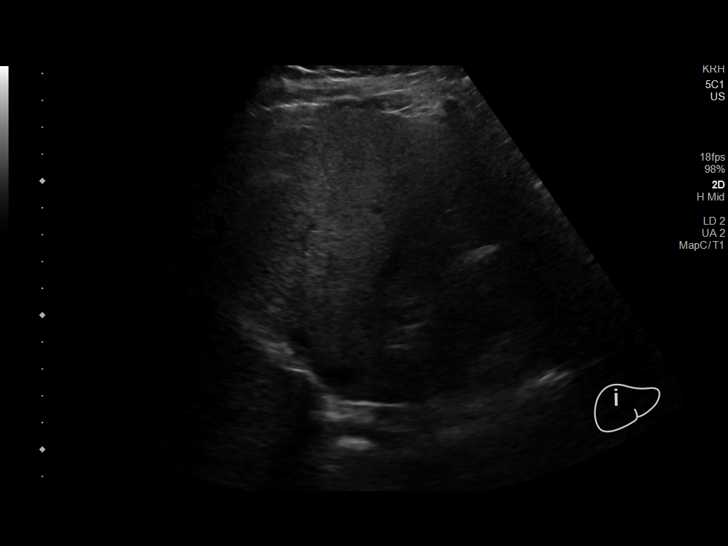
[im 28/45]
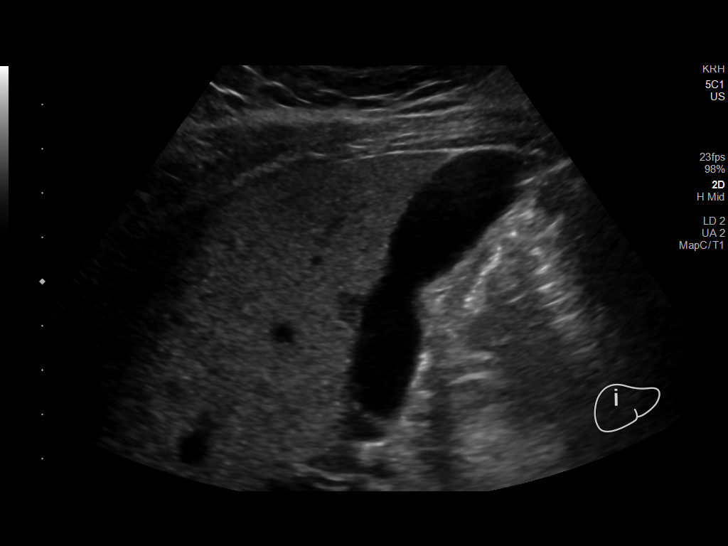
[im 30/45]
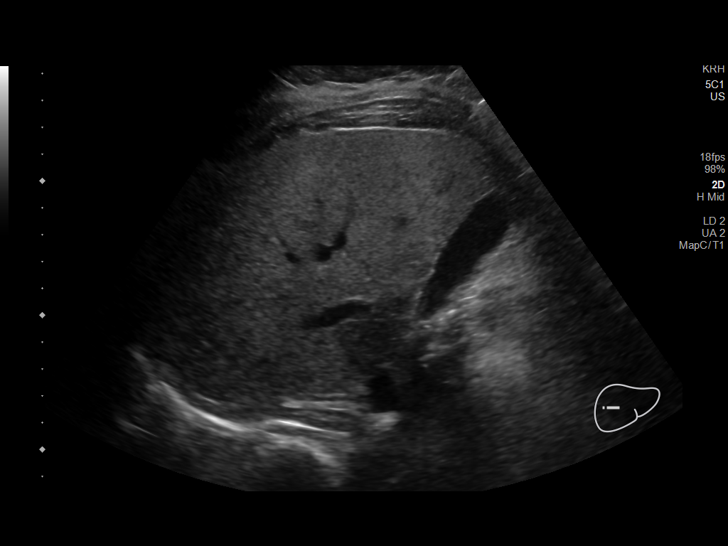
[im 34/45]
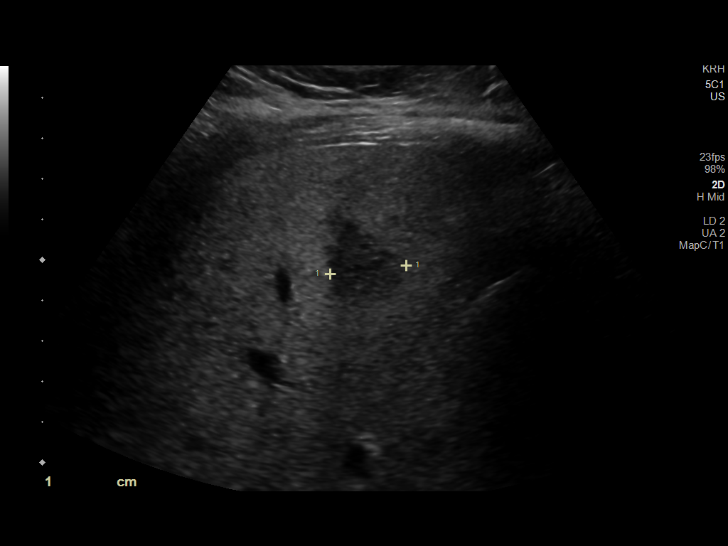
[im 37/45]
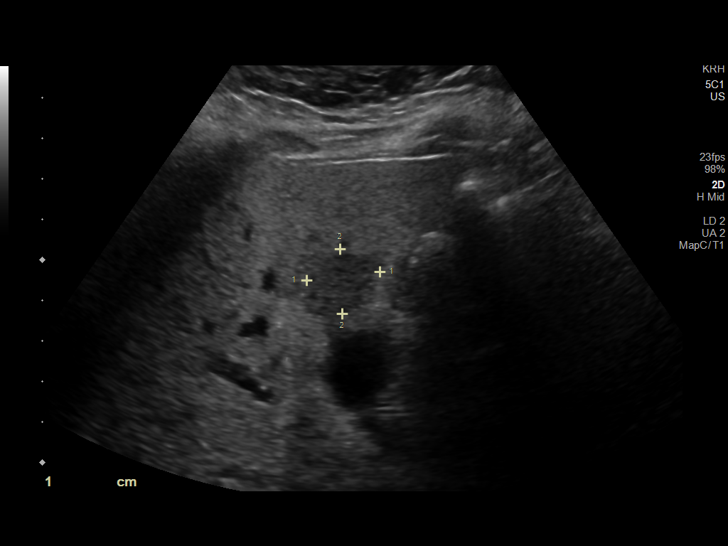
[im 41/45]
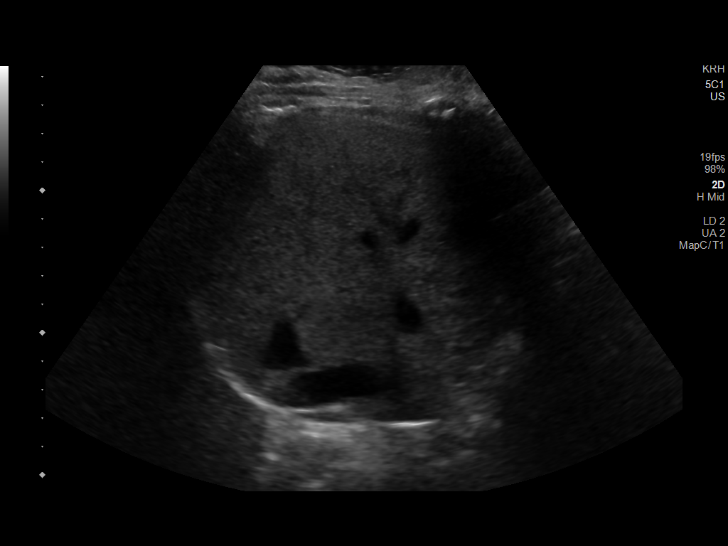
[im 45/45]
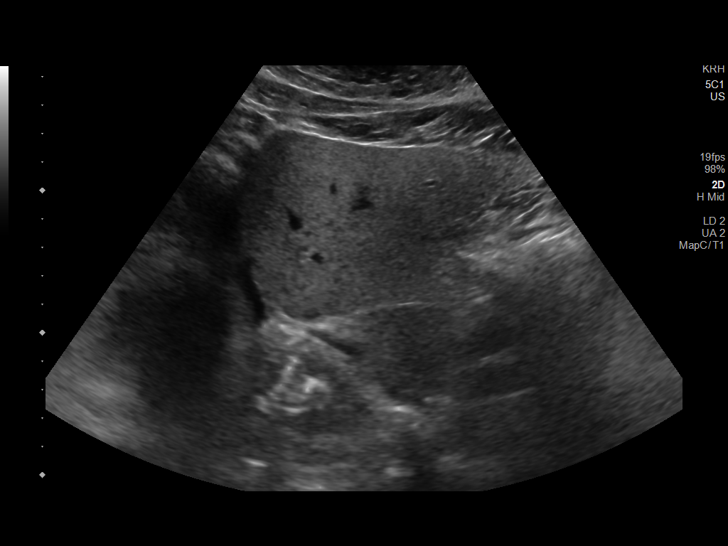

[14 of 25 positions shown; findings below may reference images not displayed]

FINDINGS: Gallbladder:

No shadowing gallstones or echogenic sludge. No gallbladder wall
thickening or pericholecystic fluid. Negative sonographic Murphy
sign according to the ultrasound technologist.

Common bile duct:

Diameter: Approximately 3 mm

Liver:

Diffusely increased and coarsened echotexture with focal sparing
surrounding the gallbladder. No hepatic parenchymal masses. Portal
vein is patent on color Doppler imaging with normal direction of
blood flow towards the liver.
IMPRESSION: 1. Diffuse hepatic steatosis and/or hepatocellular disease with
focal sparing surrounding the gallbladder. No hepatic parenchymal
masses.
2. Otherwise normal examination.

## 2020-09-14 ENCOUNTER — Other Ambulatory Visit: Payer: Self-pay

## 2020-09-14 ENCOUNTER — Ambulatory Visit
Admission: RE | Admit: 2020-09-14 | Discharge: 2020-09-14 | Disposition: A | Discharge status: Home / Self Care | Payer: 59 | Source: Ambulatory Visit | Attending: Family Medicine | Admitting: Family Medicine

## 2020-09-14 DIAGNOSIS — Z1231 Encounter for screening mammogram for malignant neoplasm of breast: Secondary | ICD-10-CM

## 2020-10-24 ENCOUNTER — Other Ambulatory Visit: Payer: Self-pay | Admitting: Family Medicine

## 2020-10-24 NOTE — Telephone Encounter (Signed)
Please Advise

## 2020-12-23 ENCOUNTER — Other Ambulatory Visit: Payer: Self-pay

## 2020-12-23 ENCOUNTER — Encounter: Payer: Self-pay | Admitting: Family Medicine

## 2020-12-23 ENCOUNTER — Ambulatory Visit (INDEPENDENT_AMBULATORY_CARE_PROVIDER_SITE_OTHER): Payer: 59 | Admitting: Family Medicine

## 2020-12-23 DIAGNOSIS — J019 Acute sinusitis, unspecified: Secondary | ICD-10-CM

## 2020-12-23 MED ORDER — FLUCONAZOLE 150 MG PO TABS: 150.0000 mg | ORAL_TABLET | Freq: Once | ORAL | 0 refills | Status: AC

## 2020-12-23 MED ORDER — AMOXICILLIN-POT CLAVULANATE 875-125 MG PO TABS: 1.0000 | ORAL_TABLET | Freq: Two times a day (BID) | ORAL | 0 refills | Status: DC

## 2020-12-23 NOTE — Progress Notes (Signed)
Subjective:    Patient ID: Mary Robles, female    DOB: September 23, 1976, 45 y.o.   MRN: [id number]  HPI Patient is being seen today in the parking lot.  She states that Monday she developed sinus pain and sinus pressure.  She reports postnasal drip and rhinorrhea.  She reports a frontal sinus headache.  She denies any fevers or chills.  She does have a nonproductive cough in addition to the rhinorrhea.  She denies any chest pain or shortness of breath or dyspnea on exertion.  She denies any wheezing.  She has been taking Xyzal and Singulair. Past Medical History:  Diagnosis Date  . Allergy   . Angio-edema   . Anxiety   . Asthma    no symptoms in "years", no inhaler  . Depression   . Fibromyalgia 2021  . Herpes simplex   . Hiatal hernia   . Hyperlipidemia due to dietary fat intake   . 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 Hypertension   . Hypokalemia   . Sickle cell trait ([place])   . Vitamin D deficiency    Current Outpatient Medications on File Prior to Visit  Medication Sig Dispense Refill  . acetaminophen (TYLENOL) 500 MG tablet Take 1,000 mg by mouth every 6 (six) hours as needed (for pain or headaches).    Marland Kitchen albuterol (PROVENTIL HFA;VENTOLIN HFA) 108 (90 Base) MCG/ACT inhaler Inhale 2 puffs into the lungs every 6 (six) hours as needed for wheezing or shortness of breath. 1 Inhaler 2  . amLODipine (NORVASC) 5 MG tablet Take 1 tablet by mouth once daily 30 tablet 3  . amoxicillin-clavulanate (AUGMENTIN) 875-125 MG tablet Take 1 tablet by mouth 2 (two) times daily. 20 tablet 0  . cloNIDine (CATAPRES) 0.1 MG tablet Take 1 tablet (0.1 mg total) by mouth 2 (two) times daily. 180 tablet 1  . DULoxetine (CYMBALTA) 30 MG capsule Take 1 capsule by mouth once daily 30 capsule 3  . EPINEPHrine (EPIPEN 2-PAK) 0.3 mg/0.3 mL IJ SOAJ injection Inject 0.3 mLs (0.3 mg total) into the muscle once as needed (for an anaphylactic reaction). 2 each 0  . fluticasone (FLONASE) 50 MCG/ACT nasal spray Place 2 sprays into  both nostrils daily as needed for allergies or rhinitis. 16 g 2  . levocetirizine (XYZAL) 5 MG tablet Take 1 tablet (5 mg total) by mouth every evening. 90 tablet 1  . montelukast (SINGULAIR) 10 MG tablet Take 1 tablet (10 mg total) by mouth at bedtime. 90 tablet 3  . pantoprazole (PROTONIX) 40 MG tablet TAKE 1 TABLET BY MOUTH TWICE DAILY BEFORE  A  MEAL 180 tablet 1  . Vitamin D, Ergocalciferol, (DRISDOL) 1.25 MG (50000 UNIT) CAPS capsule Take 1 capsule (50,000 Units total) by mouth every 7 (seven) days. x6 months, then D/C 12 capsule 1   No current facility-administered medications on file prior to visit.   Allergies  Allergen Reactions  . Vicodin [Hydrocodone-Acetaminophen] Other (See Comments)    Hallucinations   . Aldactone [Spironolactone]   . Benazepril Swelling    Lips and face became swollen  . Betadine [Povidone Iodine] Other (See Comments)    Blisters   . Other Hives, Itching and Other (See Comments)    [place] nuts  [place] nuts   . Shellfish Allergy Hives and Rash    Crab legs=Hives   Social History   Socioeconomic History  . Marital status: Divorced    Spouse name: Not on file  . Number of children: 2  .  Years of education: Not on file  . Highest education level: Bachelor's degree (e.g., BA, AB, BS)  Occupational History  . Occupation: Education officer, museum    Employer: [employer]  Tobacco Use  . Smoking status: Never Smoker  . Smokeless tobacco: Never Used  Vaping Use  . Vaping Use: Never used  Substance and Sexual Activity  . Alcohol use: Not Currently    Alcohol/week: 0.0 standard drinks  . Drug use: No  . Sexual activity: Yes    Partners: Male    Birth control/protection: Surgical    Comment: BTSP 2005/TAH/BSO  Other Topics Concern  . Not on file  Social History Narrative   Divorced mother of 2.  Lives with her son.  Works in Science writer work.  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   Social Determinants of Health   Financial Resource Strain: Not on  file  Food Insecurity: Not on file  Transportation Needs: Not on file  Physical Activity: Not on file  Stress: Not on file  Social Connections: Not on file  Intimate Partner Violence: Not on file      Review of Systems  All other systems reviewed and are negative.       Objective:   Physical exam is limited to what I can perform in the parking lot.  She is afebrile.  She denies any respiratory distress.  Her breathing is nonlabored.  Her lungs are clear to auscultation bilaterally there is no wheezes crackles or rails.  Heart shows regular rate and rhythm.  She does have tenderness to percussion over both frontal sinuses in both maxillary sinuses with clear rhinorrhea.  There is no erythema in the posterior oropharynx.       Assessment & Plan:  Acute sinusitis, recurrence not specified, unspecified location - Plan: SARS-COV-2 RNA,(COVID-19) QUAL NAAT  I believe the patient likely has a sinus infection.  I will prescribe Augmentin 875 mg p.o. twice daily for 10 days.  I will also repeat a Covid test today given the current Covid pandemic.  Recommended that she add Flonase 2 sprays each nostrils daily to the Xyzal to help with postnasal drip.

## 2020-12-27 LAB — SARS-COV-2 RNA,(COVID-19) QUALITATIVE NAAT: SARS CoV2 RNA: NOT DETECTED

## 2021-02-23 NOTE — Progress Notes (Signed)
New Patient Office Visit  Subjective:  Patient ID: Mary Robles, female    DOB: 01/19/1976  Age: 45 y.o. MRN: [id number]  CC:  Chief Complaint  Patient presents with  . Establish Care    HPI Mary Robles presents to establish care.   Allergies- has several environmental allergies with a history of chronic sinus issues. Taking Xyzal and Singulair daily.   Asthma- has an albuterol inhaler but it is expired. No need to use it in quite a while. No recent exacerbations.  Mood- does have a history of depression/anxiety. Currently taking Cymbalta 30mg  nightly. Was restarted on this last year after having a panic attack.   Fibromyalgia- has intermittent dizziness that she relates to this. Taking Cymbalta as above.   HTN- history of many allergies to various blood pressures with noted reaction of facial swelling. Currently on amlodipine 5mg  daily and Clonidine 0.1mg  BID. BP elevated today. Occasionally checks at home with variable readings, last one a bit elevated. Has room for improvement in her diet.   HLD- diet controlled.  GERD- taking Protonix BID for a couple of years for severe symptoms. Has tried to reduced to once daily but her symptoms worsen. Fairly well controlled on BID dosing.   Vitamin D deficiency- recently finished a prescription for high dose replacement but has had this for years. Not currently taking a daily supplement.   Past Medical History:  Diagnosis Date  . Allergy   . Angio-edema   . Anxiety   . Asthma    no symptoms in "years", no inhaler  . Depression   . Fibromyalgia 2021  . Herpes simplex   . Hiatal hernia   . Hyperlipidemia due to dietary fat intake   . 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 Hypertension   . Hypokalemia   . Sickle cell trait ([place])   . Vitamin D deficiency     Past Surgical History:  Procedure Laterality Date  . [place] STUDY N/A 07/08/2019   Procedure: [place] STUDY;  Surgeon: Thornton Park, MD;  Location: [facility];  Service:  Gastroenterology;  Laterality: N/A;  . ABDOMINAL HYSTERECTOMY N/A 05/04/2014   Procedure: HYSTERECTOMY ABDOMINAL with BILATERAL SALPINGECTOMY ;  Surgeon: Jamey Reas de Berton Lan, MD;  Location: [facility];  Service: Gynecology;  Laterality: N/A;  . BREAST BIOPSY Right Jan. 2016   benign fibroadenoma  . ESOPHAGEAL MANOMETRY N/A 07/08/2019   Procedure: ESOPHAGEAL MANOMETRY (EM);  Surgeon: Thornton Park, MD;  Location: [facility];  Service: Gastroenterology;  Laterality: N/A;  . FOOT SURGERY    . LAPAROTOMY N/A 05/04/2014   Procedure: REMOVAL OF ABD WALL MASS ;  Surgeon: Adin Hector, MD;  Location: [facility];  Service: General;  Laterality: N/A;  . LIPOMA EXCISION N/A 05/04/2014   Procedure: EXCISION LIPOMA of abdominal wall;  Surgeon: Jamey Reas de Berton Lan, MD;  Location: [facility];  Service: Gynecology;  Laterality: N/A;  . NASAL SINUS SURGERY  2019  . PELVIC LAPAROSCOPY Right 2018   oophorectomy  . TUBAL LIGATION      Family History  Problem Relation Age of Onset  . Diabetes Mother   . Thyroid disease Mother   . Colon polyps Mother   . Stroke Father   . Mental retardation Maternal Aunt   . Diabetes Maternal Grandmother   . Colon cancer Neg Hx   . Esophageal cancer Neg Hx   . Stomach cancer Neg Hx   . Rectal cancer Neg  Hx     Social History   Socioeconomic History  . Marital status: Divorced    Spouse name: Not on file  . Number of children: 2  . Years of education: Not on file  . Highest education level: Bachelor's degree (e.g., BA, AB, BS)  Occupational History  . Occupation: Education officer, museum    Employer: [employer]  Tobacco Use  . Smoking status: Never Smoker  . Smokeless tobacco: Never Used  Vaping Use  . Vaping Use: Never used  Substance and Sexual Activity  . Alcohol use: Not Currently    Alcohol/week: 0.0 standard drinks  . Drug use: Never  . Sexual activity: Yes    Partners: Male    Birth control/protection: Surgical     Comment: BTSP 2005/TAH/BSO  Other Topics Concern  . Not on file  Social History Narrative   Divorced mother of 2.  Lives with her son.  Works in Science writer work.  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   Social Determinants of Health   Financial Resource Strain: Not on file  Food Insecurity: Not on file  Transportation Needs: Not on file  Physical Activity: Not on file  Stress: Not on file  Social Connections: Not on file  Intimate Partner Violence: Not on file    ROS Review of Systems  Constitutional: Positive for diaphoresis (night sweats) and unexpected weight change. Negative for chills, fatigue and fever.  HENT: Positive for sinus pressure.   Respiratory: Negative for cough, chest tightness, shortness of breath and wheezing.   Cardiovascular: Positive for leg swelling (mild right ankle swelling). Negative for chest pain and palpitations.  Gastrointestinal: Positive for constipation. Negative for abdominal pain, blood in stool, diarrhea, nausea and vomiting.       Heartburn   Endocrine: Positive for cold intolerance.  Genitourinary: Positive for hematuria (history of, related to medication per patient). Negative for dysuria, frequency and urgency.  Skin:       Lesion on back, antibiotics placed in it but did not resolve   Allergic/Immunologic: Positive for environmental allergies and food allergies.  Neurological: Positive for dizziness (intermittent ). Negative for light-headedness and headaches.  Psychiatric/Behavioral: Positive for dysphoric mood. Negative for self-injury, sleep disturbance and suicidal ideas. The patient is nervous/anxious.     Objective:   Today's Vitals: BP (!) 142/89   Pulse 82   Temp 98.5 F (36.9 C)   Ht 5' 6.75" (1.695 m)   Wt 202 lb 8 oz (91.9 kg)   LMP 04/17/2014 Comment: hysterectomy w/ single oophorectomy//a.c.  SpO2 100%   BMI 31.95 kg/m   Physical Exam Vitals reviewed.  Constitutional:      General: She is not in acute distress.     Appearance: Normal appearance.  HENT:     Head: Normocephalic and atraumatic.  Cardiovascular:     Rate and Rhythm: Normal rate and regular rhythm.     Pulses: Normal pulses.     Heart sounds: Normal heart sounds. No murmur heard. No friction rub. No gallop.   Pulmonary:     Effort: Pulmonary effort is normal. No respiratory distress.     Breath sounds: Normal breath sounds. No wheezing.  Skin:    General: Skin is warm and dry.  Neurological:     Mental Status: She is alert and oriented to person, place, and time.  Psychiatric:        Mood and Affect: Mood normal.        Behavior: Behavior normal.  Thought Content: Thought content normal.        Judgment: Judgment normal.     Assessment & Plan:   1. Encounter to establish care Reviewed available information and discussed care concerns with patient.   2. Primary hypertension Checking CBC w/diff and CMP. Continue Amlodipine 5mg  daily and Clonidine 0.1mg  BID. Monitor BP at home with goal of 130/80 or less. If consistently higher, return for further evaluation. Limit dietary sodium.  - COMPLETE METABOLIC PANEL WITH GFR - CBC with Differential/Platelet  3. Gastroesophageal reflux disease without esophagitis Continue Pantoprazole as ordered.   4. Mild intermittent asthma without complication Continue Singulair. Sending a new albuterol inhaler since her's has expired.  - montelukast (SINGULAIR) 10 MG tablet; Take 1 tablet (10 mg total) by mouth at bedtime.  Dispense: 90 tablet; Refill: 3  5. Vitamin D deficiency Checking Vitamin D. Long history of deficiency so recommend taking 1,000-2,000 units daily.  - VITAMIN D 25 Hydroxy (Vit-D Deficiency, Fractures)  6. Current severe episode of major depressive disorder without psychotic features without prior episode ([place]) Continue Cymbalta 30mg  nightly.   7. Mixed hyperlipidemia Checking lipid panel. Recommend low-fat heart healthy diet, regular exercise, and weight loss.  -  Lipid panel  8. GAD (generalized anxiety disorder) Continue Cymbalta as ordered.   9. Fibromyalgia Continue Cymbalta.   10. Screening for HIV (human immunodeficiency virus)/Hepatitis C Reports this has already been done so we will look for records and update accordingly.   11. Screening for colon cancer Referring to GI for colonoscopy.  - Ambulatory referral to Gastroenterology  12. Family history of thyroid cancer Checking TSH.  - TSH   Outpatient Encounter Medications as of 02/24/2021  Medication Sig  . albuterol (VENTOLIN HFA) 108 (90 Base) MCG/ACT inhaler Inhale 2 puffs into the lungs every 6 (six) hours as needed for wheezing.  Marland Kitchen EPINEPHrine (EPIPEN 2-PAK) 0.3 mg/0.3 mL IJ SOAJ injection Inject 0.3 mLs (0.3 mg total) into the muscle once as needed (for an anaphylactic reaction).  . Probiotic Product (PROBIOTIC PO) Take by mouth daily.  . [DISCONTINUED] amLODipine (NORVASC) 5 MG tablet Take 1 tablet by mouth once daily  . [DISCONTINUED] cetirizine (ZYRTEC) 10 MG tablet Take 10 mg by mouth daily.  . [DISCONTINUED] cloNIDine (CATAPRES) 0.1 MG tablet Take 1 tablet (0.1 mg total) by mouth 2 (two) times daily.  . [DISCONTINUED] DULoxetine (CYMBALTA) 30 MG capsule Take 1 capsule by mouth once daily  . [DISCONTINUED] montelukast (SINGULAIR) 10 MG tablet Take 1 tablet (10 mg total) by mouth at bedtime.  . [DISCONTINUED] pantoprazole (PROTONIX) 40 MG tablet TAKE 1 TABLET BY MOUTH TWICE DAILY BEFORE  A  MEAL  . amLODipine (NORVASC) 5 MG tablet Take 1 tablet (5 mg total) by mouth daily.  . cetirizine (ZYRTEC) 10 MG tablet Take 1 tablet (10 mg total) by mouth daily.  . cloNIDine (CATAPRES) 0.1 MG tablet Take 1 tablet (0.1 mg total) by mouth 2 (two) times daily.  . DULoxetine (CYMBALTA) 30 MG capsule Take 1 capsule (30 mg total) by mouth daily.  . montelukast (SINGULAIR) 10 MG tablet Take 1 tablet (10 mg total) by mouth at bedtime.  . pantoprazole (PROTONIX) 40 MG tablet TAKE 1 TABLET BY  MOUTH TWICE DAILY BEFORE  A  MEAL  . [DISCONTINUED] acetaminophen (TYLENOL) 500 MG tablet Take 1,000 mg by mouth every 6 (six) hours as needed (for pain or headaches).  . [DISCONTINUED] albuterol (PROVENTIL HFA;VENTOLIN HFA) 108 (90 Base) MCG/ACT inhaler Inhale 2 puffs into the lungs  every 6 (six) hours as needed for wheezing or shortness of breath.  . [DISCONTINUED] amoxicillin-clavulanate (AUGMENTIN) 875-125 MG tablet Take 1 tablet by mouth 2 (two) times daily.  . [DISCONTINUED] amoxicillin-clavulanate (AUGMENTIN) 875-125 MG tablet Take 1 tablet by mouth 2 (two) times daily.  . [DISCONTINUED] DULoxetine (CYMBALTA) 60 MG capsule Take 60 mg by mouth daily.  . [DISCONTINUED] fluticasone (FLONASE) 50 MCG/ACT nasal spray Place 2 sprays into both nostrils daily as needed for allergies or rhinitis.  . [DISCONTINUED] levocetirizine (XYZAL) 5 MG tablet Take 1 tablet (5 mg total) by mouth every evening.  . [DISCONTINUED] Vitamin D, Ergocalciferol, (DRISDOL) 1.25 MG (50000 UNIT) CAPS capsule Take 1 capsule (50,000 Units total) by mouth every 7 (seven) days. x6 months, then D/C   No facility-administered encounter medications on file as of 02/24/2021.   Follow-up: Return in about 6 months (around 08/27/2021) for chronic disease follow up.   Clearnce Sorrel, DNP, APRN, FNP-BC [facility]

## 2021-02-24 ENCOUNTER — Other Ambulatory Visit: Payer: Self-pay

## 2021-02-24 ENCOUNTER — Encounter: Payer: Self-pay | Admitting: Medical-Surgical

## 2021-02-24 ENCOUNTER — Ambulatory Visit: Payer: 59 | Admitting: Medical-Surgical

## 2021-02-24 VITALS — BP 142/89 | HR 82 | Temp 98.5°F | Ht 66.75 in | Wt 202.5 lb

## 2021-02-24 DIAGNOSIS — J4520 Mild intermittent asthma, uncomplicated: Secondary | ICD-10-CM

## 2021-02-24 DIAGNOSIS — M797 Fibromyalgia: Secondary | ICD-10-CM

## 2021-02-24 DIAGNOSIS — I10 Essential (primary) hypertension: Secondary | ICD-10-CM | POA: Diagnosis not present

## 2021-02-24 DIAGNOSIS — K219 Gastro-esophageal reflux disease without esophagitis: Secondary | ICD-10-CM

## 2021-02-24 DIAGNOSIS — F411 Generalized anxiety disorder: Secondary | ICD-10-CM

## 2021-02-24 DIAGNOSIS — Z1159 Encounter for screening for other viral diseases: Secondary | ICD-10-CM

## 2021-02-24 DIAGNOSIS — Z1211 Encounter for screening for malignant neoplasm of colon: Secondary | ICD-10-CM

## 2021-02-24 DIAGNOSIS — E559 Vitamin D deficiency, unspecified: Secondary | ICD-10-CM

## 2021-02-24 DIAGNOSIS — Z7689 Persons encountering health services in other specified circumstances: Secondary | ICD-10-CM | POA: Diagnosis not present

## 2021-02-24 DIAGNOSIS — Z114 Encounter for screening for human immunodeficiency virus [HIV]: Secondary | ICD-10-CM

## 2021-02-24 DIAGNOSIS — F322 Major depressive disorder, single episode, severe without psychotic features: Secondary | ICD-10-CM

## 2021-02-24 DIAGNOSIS — Z808 Family history of malignant neoplasm of other organs or systems: Secondary | ICD-10-CM

## 2021-02-24 DIAGNOSIS — E782 Mixed hyperlipidemia: Secondary | ICD-10-CM

## 2021-02-24 MED ORDER — CETIRIZINE HCL 10 MG PO TABS: 10.0000 mg | ORAL_TABLET | Freq: Every day | ORAL | 3 refills | Status: DC

## 2021-02-24 MED ORDER — DULOXETINE HCL 30 MG PO CPEP: 30.0000 mg | ORAL_CAPSULE | Freq: Every day | ORAL | 3 refills | Status: DC

## 2021-02-24 MED ORDER — MONTELUKAST SODIUM 10 MG PO TABS: 10.0000 mg | ORAL_TABLET | Freq: Every day | ORAL | 3 refills | Status: DC

## 2021-02-24 MED ORDER — AMLODIPINE BESYLATE 5 MG PO TABS: 5.0000 mg | ORAL_TABLET | Freq: Every day | ORAL | 3 refills | Status: DC

## 2021-02-24 MED ORDER — PANTOPRAZOLE SODIUM 40 MG PO TBEC: DELAYED_RELEASE_TABLET | ORAL | 1 refills | Status: DC

## 2021-02-24 MED ORDER — ALBUTEROL SULFATE HFA 108 (90 BASE) MCG/ACT IN AERS: 2.0000 | INHALATION_SPRAY | Freq: Four times a day (QID) | RESPIRATORY_TRACT | 11 refills | Status: DC | PRN

## 2021-02-24 MED ORDER — CLONIDINE HCL 0.1 MG PO TABS: 0.1000 mg | ORAL_TABLET | Freq: Two times a day (BID) | ORAL | 1 refills | Status: DC

## 2021-02-25 LAB — CBC WITH DIFFERENTIAL/PLATELET
Absolute Monocytes: 805 cells/uL (ref 200–950)
Basophils Absolute: 58 cells/uL (ref 0–200)
Basophils Relative: 0.7 %
Eosinophils Absolute: 208 cells/uL (ref 15–500)
Eosinophils Relative: 2.5 %
HCT: 41.5 % (ref 35.0–45.0)
Hemoglobin: 13.7 g/dL (ref 11.7–15.5)
Lymphs Abs: 2872 cells/uL (ref 850–3900)
MCH: 27.3 pg (ref 27.0–33.0)
MCHC: 33 g/dL (ref 32.0–36.0)
MCV: 82.7 fL (ref 80.0–100.0)
MPV: 10.8 fL (ref 7.5–12.5)
Monocytes Relative: 9.7 %
Neutro Abs: 4358 cells/uL (ref 1500–7800)
Neutrophils Relative %: 52.5 %
Platelets: 391 10*3/uL (ref 140–400)
RBC: 5.02 10*6/uL (ref 3.80–5.10)
RDW: 15.7 % — ABNORMAL HIGH (ref 11.0–15.0)
Total Lymphocyte: 34.6 %
WBC: 8.3 10*3/uL (ref 3.8–10.8)

## 2021-02-25 LAB — COMPLETE METABOLIC PANEL WITH GFR
AG Ratio: 1.4 (calc) (ref 1.0–2.5)
ALT: 23 U/L (ref 6–29)
AST: 19 U/L (ref 10–35)
Albumin: 4.1 g/dL (ref 3.6–5.1)
Alkaline phosphatase (APISO): 62 U/L (ref 31–125)
BUN: 7 mg/dL (ref 7–25)
CO2: 30 mmol/L (ref 20–32)
Calcium: 9.2 mg/dL (ref 8.6–10.2)
Chloride: 101 mmol/L (ref 98–110)
Creat: 0.9 mg/dL (ref 0.50–1.10)
GFR, Est African American: 89 mL/min/{1.73_m2} (ref >=60)
GFR, Est Non African American: 77 mL/min/{1.73_m2} (ref >=60)
Globulin: 2.9 g/dL (calc) (ref 1.9–3.7)
Glucose, Bld: 101 mg/dL — ABNORMAL HIGH (ref 65–99)
Potassium: 4.3 mmol/L (ref 3.5–5.3)
Sodium: 140 mmol/L (ref 135–146)
Total Bilirubin: 0.5 mg/dL (ref 0.2–1.2)
Total Protein: 7 g/dL (ref 6.1–8.1)

## 2021-02-25 LAB — LIPID PANEL
Cholesterol: 197 mg/dL (ref <200)
HDL: 44 mg/dL — ABNORMAL LOW (ref >=50)
LDL Cholesterol (Calc): 131 mg/dL (calc) — ABNORMAL HIGH
Non-HDL Cholesterol (Calc): 153 mg/dL (calc) — ABNORMAL HIGH (ref <130)
Total CHOL/HDL Ratio: 4.5 (calc) (ref <5.0)
Triglycerides: 108 mg/dL (ref <150)

## 2021-02-25 LAB — VITAMIN D 25 HYDROXY (VIT D DEFICIENCY, FRACTURES): Vit D, 25-Hydroxy: 58 ng/mL (ref 30–100)

## 2021-02-25 LAB — TSH: TSH: 3.04 mIU/L

## 2021-02-28 ENCOUNTER — Ambulatory Visit: Payer: 59 | Admitting: Family Medicine

## 2021-04-23 ENCOUNTER — Encounter: Payer: Self-pay | Admitting: Emergency Medicine

## 2021-04-23 ENCOUNTER — Emergency Department
Admission: EM | Admit: 2021-04-23 | Discharge: 2021-04-23 | Disposition: A | Discharge status: Home / Self Care | Payer: 59 | Source: Home / Self Care

## 2021-04-23 ENCOUNTER — Other Ambulatory Visit: Payer: Self-pay

## 2021-04-23 DIAGNOSIS — J3489 Other specified disorders of nose and nasal sinuses: Secondary | ICD-10-CM

## 2021-04-23 DIAGNOSIS — J0100 Acute maxillary sinusitis, unspecified: Secondary | ICD-10-CM

## 2021-04-23 MED ORDER — AMOXICILLIN-POT CLAVULANATE 875-125 MG PO TABS: 1.0000 | ORAL_TABLET | Freq: Two times a day (BID) | ORAL | 0 refills | Status: DC

## 2021-04-23 MED ORDER — PREDNISONE 20 MG PO TABS: ORAL_TABLET | ORAL | 0 refills | Status: DC

## 2021-04-23 MED ORDER — METHYLPREDNISOLONE SODIUM SUCC 125 MG IJ SOLR
125.0000 mg | Freq: Once | INTRAMUSCULAR | Status: AC
  Administered 2021-04-23: 125 mg via INTRAMUSCULAR

## 2021-04-23 NOTE — ED Triage Notes (Signed)
Patient here day 4 of facial pain in sinus areas; left ear and jaw pain. Has history of sinus infections and did have a surgery for this which has not helped. No known fever. Has not had any covid vaccinations.

## 2021-04-23 NOTE — Discharge Instructions (Addendum)
Advised patient to take medications with food to completion.  Encourage patient to increase daily water intake while taking these medications.

## 2021-04-23 NOTE — ED Provider Notes (Signed)
[facility]    CSN: [id number] Arrival date & time: 04/23/21  1107      History   Chief Complaint Chief Complaint  Patient presents with  . Facial Pain  . Ear Pain    HPI Mary Robles is a 45 y.o. female.   HPI 45 year old female presents with sinus nasal congestion, sinus pressure, jaw pain and left otalgia for 6 days.  Patient requests work note for today and the next 2 days.  Patient denies fever.  Past Medical History:  Diagnosis Date  . Allergy   . Angio-edema   . Anxiety   . Asthma    no symptoms in "years", no inhaler  . Depression   . Fibromyalgia 2021  . Herpes simplex   . Hiatal hernia   . Hyperlipidemia due to dietary fat intake   . Hyperlysinemia ([place])   . Hypertension   . Hypokalemia   . Sickle cell trait ([place])   . Vitamin D deficiency     Patient Active Problem List   Diagnosis Date Noted  . Fibromyalgia 02/16/2020  . Cough   . Gastroesophageal reflux disease   . Heartburn   . Chest pain with low risk for cardiac etiology 09/17/2018  . DOE (dyspnea on exertion) 09/17/2018  . Obesity (BMI 30.0-34.9) 07/02/2017  . MDD (major depressive disorder) 07/16/2016  . Asthma, mild 04/11/2015  . GAD (generalized anxiety disorder) 02/08/2015  . Hyperlipidemia 07/09/2014  . Insomnia 07/09/2014  . Lipoma of abdominal wall s/p excision 05/04/2014 05/19/2014  . Status post total abdominal hysterectomy 05/04/2014  . Hypertension   . Vitamin D deficiency     Past Surgical History:  Procedure Laterality Date  . [place] STUDY N/A 07/08/2019   Procedure: [place] STUDY;  Surgeon: Thornton Park, MD;  Location: [facility];  Service: Gastroenterology;  Laterality: N/A;  . ABDOMINAL HYSTERECTOMY N/A 05/04/2014   Procedure: HYSTERECTOMY ABDOMINAL with BILATERAL SALPINGECTOMY ;  Surgeon: Jamey Reas de Berton Lan, MD;  Location: [facility];  Service: Gynecology;  Laterality: N/A;  . BREAST BIOPSY Right Jan. 2016   benign fibroadenoma   . ESOPHAGEAL MANOMETRY N/A 07/08/2019   Procedure: ESOPHAGEAL MANOMETRY (EM);  Surgeon: Thornton Park, MD;  Location: [facility];  Service: Gastroenterology;  Laterality: N/A;  . FOOT SURGERY    . LAPAROTOMY N/A 05/04/2014   Procedure: REMOVAL OF ABD WALL MASS ;  Surgeon: Adin Hector, MD;  Location: [facility];  Service: General;  Laterality: N/A;  . LIPOMA EXCISION N/A 05/04/2014   Procedure: EXCISION LIPOMA of abdominal wall;  Surgeon: Jamey Reas de Berton Lan, MD;  Location: [facility];  Service: Gynecology;  Laterality: N/A;  . NASAL SINUS SURGERY  2019  . PELVIC LAPAROSCOPY Right 2018   oophorectomy  . TUBAL LIGATION      OB History    Gravida  4   Para  2   Term      Preterm      AB  1   Living  2     SAB      IAB  1   Ectopic      Multiple      Live Births               Home Medications    Prior to Admission medications   Medication Sig Start Date End Date Taking? Authorizing Provider  amoxicillin-clavulanate (AUGMENTIN) 875-125 MG tablet Take 1 tablet by mouth every 12 (  twelve) hours. 04/23/21  Yes Eliezer Lofts, FNP  predniSONE (DELTASONE) 20 MG tablet Take 3 tabs PO x 5 days. 04/23/21  Yes Eliezer Lofts, FNP  albuterol (VENTOLIN HFA) 108 (90 Base) MCG/ACT inhaler Inhale 2 puffs into the lungs every 6 (six) hours as needed for wheezing. 02/24/21   Samuel Bouche, NP  amLODipine (NORVASC) 5 MG tablet Take 1 tablet (5 mg total) by mouth daily. 02/24/21   Samuel Bouche, NP  cetirizine (ZYRTEC) 10 MG tablet Take 1 tablet (10 mg total) by mouth daily. 02/24/21   Samuel Bouche, NP  cloNIDine (CATAPRES) 0.1 MG tablet Take 1 tablet (0.1 mg total) by mouth 2 (two) times daily. 02/24/21   Samuel Bouche, NP  DULoxetine (CYMBALTA) 30 MG capsule Take 1 capsule (30 mg total) by mouth daily. 02/24/21   Samuel Bouche, NP  EPINEPHrine (EPIPEN 2-PAK) 0.3 mg/0.3 mL IJ SOAJ injection Inject 0.3 mLs (0.3 mg total) into the muscle once as needed (for an anaphylactic reaction).  07/14/19   [place], Modena Nunnery, MD  montelukast (SINGULAIR) 10 MG tablet Take 1 tablet (10 mg total) by mouth at bedtime. 02/24/21   Samuel Bouche, NP  pantoprazole (PROTONIX) 40 MG tablet TAKE 1 TABLET BY MOUTH TWICE DAILY BEFORE  A  MEAL 02/24/21   Samuel Bouche, NP  Probiotic Product (PROBIOTIC PO) Take by mouth daily.    [provider]    Family History Family History  Problem Relation Age of Onset  . Diabetes Mother   . Thyroid disease Mother   . Colon polyps Mother   . Stroke Father   . Mental retardation Maternal Aunt   . Diabetes Maternal Grandmother   . Colon cancer Neg Hx   . Esophageal cancer Neg Hx   . Stomach cancer Neg Hx   . Rectal cancer Neg Hx     Social History Social History   Tobacco Use  . Smoking status: Never Smoker  . Smokeless tobacco: Never Used  Vaping Use  . Vaping Use: Never used  Substance Use Topics  . Alcohol use: Not Currently    Alcohol/week: 0.0 standard drinks  . Drug use: Never     Allergies   Aldactone [spironolactone], Benazepril, Betadine [povidone iodine], Other, Vicodin [hydrocodone-acetaminophen], and Shellfish allergy   Review of Systems Review of Systems  Constitutional: Negative.   HENT: Positive for congestion and sinus pressure.   Eyes: Negative.   Respiratory: Negative.   Cardiovascular: Negative.   Gastrointestinal: Negative.   Genitourinary: Negative.   Musculoskeletal: Negative.   Skin: Negative.   Neurological: Negative.      Physical Exam Triage Vital Signs ED Triage Vitals  Enc Vitals Group     BP 04/23/21 1130 134/90     Pulse Rate 04/23/21 1130 87     Resp 04/23/21 1130 16     Temp 04/23/21 1130 98.7 F (37.1 C)     Temp Source 04/23/21 1130 Oral     SpO2 04/23/21 1130 100 %     Weight 04/23/21 1131 195 lb (88.5 kg)     Height 04/23/21 1131 5\' 5"  (1.651 m)     Head Circumference --      Peak Flow --      Pain Score 04/23/21 1131 8     Pain Loc --      Pain Edu? --      Excl. in [place]? --     No data found.  Updated Vital Signs BP 134/90 (BP Location: Right Arm)  Pulse 87   Temp 98.7 F (37.1 C) (Oral)   Resp 16   Ht 5\' 5"  (1.651 m)   Wt 195 lb (88.5 kg)   LMP 04/17/2014 Comment: hysterectomy w/ single oophorectomy//a.c.  SpO2 100%   BMI 32.45 kg/m      Physical Exam Vitals and nursing note reviewed.  Constitutional:      General: She is not in acute distress.    Appearance: Normal appearance. She is not ill-appearing.  HENT:     Head: Normocephalic and atraumatic.     Right Ear: Ear canal normal. Tympanic membrane is retracted.     Left Ear: Ear canal normal. Tympanic membrane is retracted.     Nose:     Right Turbinates: Enlarged.     Left Turbinates: Enlarged.     Right Sinus: Maxillary sinus tenderness present.     Left Sinus: Maxillary sinus tenderness present.     Mouth/Throat:     Mouth: Mucous membranes are moist.     Pharynx: Oropharynx is clear. Uvula midline.  Eyes:     Extraocular Movements: Extraocular movements intact.     Conjunctiva/sclera: Conjunctivae normal.     Pupils: Pupils are equal, round, and reactive to light.  Cardiovascular:     Rate and Rhythm: Normal rate and regular rhythm.     Pulses: Normal pulses.     Heart sounds: Normal heart sounds.  Pulmonary:     Effort: Pulmonary effort is normal.     Breath sounds: Normal breath sounds.  Musculoskeletal:     Cervical back: Normal range of motion and neck supple.  Lymphadenopathy:     Cervical: No cervical adenopathy.  Skin:    General: Skin is warm and dry.  Neurological:     General: No focal deficit present.     Mental Status: She is alert and oriented to person, place, and time.  Psychiatric:        Mood and Affect: Mood normal.        Behavior: Behavior normal.      UC Treatments / Results  Labs (all labs ordered are listed, but only abnormal results are displayed) Labs Reviewed - No data to display  EKG   Radiology No results  found.  Procedures Procedures (including critical care time)  Medications Ordered in UC Medications  methylPREDNISolone sodium succinate (SOLU-MEDROL) 125 mg/2 mL injection 125 mg (125 mg Intramuscular Given 04/23/21 1235)    Initial Impression / Assessment and Plan / UC Course  I have reviewed the triage vital signs and the nursing notes.  Pertinent labs & imaging results that were available during my care of the patient were reviewed by me and considered in my medical decision making (see chart for details).     1.  Acute maxillary sinusitis, 2.  Sinus pressure.  Patient discharged home in hemodynamically stable.  Advised patient to start prescribed medicine now. Final Clinical Impressions(s) / UC Diagnoses   Final diagnoses:  Acute maxillary sinusitis, recurrence not specified  Sinus pressure     Discharge Instructions     Advised patient to take medications with food to completion.  Encourage patient to increase daily water intake while taking these medications.    ED Prescriptions    Medication Sig Dispense Auth. Provider   predniSONE (DELTASONE) 20 MG tablet Take 3 tabs PO x 5 days. 15 tablet Eliezer Lofts, FNP   amoxicillin-clavulanate (AUGMENTIN) 875-125 MG tablet Take 1 tablet by mouth every 12 (twelve) hours. 14 tablet Rockie Schnoor,  [place]grand [place], [place]     PDMP not reviewed this encounter.   Eliezer Lofts, [place] 04/23/21 1404

## 2021-05-04 ENCOUNTER — Telehealth: Payer: Self-pay

## 2021-05-04 MED ORDER — FLUCONAZOLE 150 MG PO TABS: 150.0000 mg | ORAL_TABLET | Freq: Once | ORAL | 0 refills | Status: AC

## 2021-05-04 NOTE — Telephone Encounter (Signed)
Pt was seen in UC on 04/23/21 and dx with sinusitis. She was given a Rx for an atbx and prednisone and now has a yeast inf. She has tried OTC treatments with no benefit. She would like a Rx for fluconazole sent in for her.

## 2021-05-04 NOTE — Telephone Encounter (Signed)
Pt aware via voicemail that the Rx has been sent to the pharmacy. Instructed patient to call back if there are any questions or concerns.   

## 2021-05-04 NOTE — Telephone Encounter (Signed)
Diflucan sent to pharmacy as requested. Clearnce Sorrel, DNP, APRN, FNP-BC [facility]

## 2021-06-30 ENCOUNTER — Ambulatory Visit (AMBULATORY_SURGERY_CENTER): Payer: Self-pay | Admitting: *Deleted

## 2021-06-30 ENCOUNTER — Other Ambulatory Visit: Payer: Self-pay

## 2021-06-30 VITALS — Ht 66.75 in | Wt 202.0 lb

## 2021-06-30 DIAGNOSIS — Z1211 Encounter for screening for malignant neoplasm of colon: Secondary | ICD-10-CM

## 2021-06-30 NOTE — Progress Notes (Signed)

## 2021-07-11 ENCOUNTER — Encounter: Payer: Self-pay | Admitting: Medical-Surgical

## 2021-07-11 ENCOUNTER — Ambulatory Visit: Payer: 59 | Admitting: Medical-Surgical

## 2021-07-11 ENCOUNTER — Other Ambulatory Visit: Payer: Self-pay

## 2021-07-11 VITALS — BP 141/96 | HR 83 | Resp 20 | Ht 66.75 in | Wt 202.0 lb

## 2021-07-11 DIAGNOSIS — I10 Essential (primary) hypertension: Secondary | ICD-10-CM | POA: Diagnosis not present

## 2021-07-11 DIAGNOSIS — K219 Gastro-esophageal reflux disease without esophagitis: Secondary | ICD-10-CM

## 2021-07-11 DIAGNOSIS — F5101 Primary insomnia: Secondary | ICD-10-CM

## 2021-07-11 DIAGNOSIS — E782 Mixed hyperlipidemia: Secondary | ICD-10-CM

## 2021-07-11 DIAGNOSIS — F411 Generalized anxiety disorder: Secondary | ICD-10-CM

## 2021-07-11 DIAGNOSIS — F322 Major depressive disorder, single episode, severe without psychotic features: Secondary | ICD-10-CM

## 2021-07-11 MED ORDER — EPINEPHRINE 0.3 MG/0.3ML IJ SOAJ: 0.3000 mg | Freq: Once | INTRAMUSCULAR | 0 refills | Status: DC | PRN

## 2021-07-11 MED ORDER — AMLODIPINE BESYLATE 5 MG PO TABS: 5.0000 mg | ORAL_TABLET | Freq: Every day | ORAL | 3 refills | Status: DC

## 2021-07-11 MED ORDER — PANTOPRAZOLE SODIUM 20 MG PO TBEC: 20.0000 mg | DELAYED_RELEASE_TABLET | Freq: Every day | ORAL | 1 refills | Status: DC

## 2021-07-11 MED ORDER — CLONIDINE HCL 0.1 MG PO TABS: 0.1000 mg | ORAL_TABLET | Freq: Two times a day (BID) | ORAL | 1 refills | Status: DC

## 2021-07-11 NOTE — Progress Notes (Signed)
  HPI with pertinent ROS:   CC: BP follow up  HPI: Pleasant 45 year old female presenting for the following:  HTN: Taking amlodipine 5 mg daily and clonidine 0.1 mg twice daily.  Tolerating both medications well without side effects.  Has not been checking her blood pressure at home as her blood pressure is in [place] and she has been staying in [place].  She is able to go get the blood pressure cuff to monitor if needed.  Has been eating a low-sodium diet.  Not exercising.  Having bilateral lower extremity edema in the evenings and after standing most of the day.  Edema does resolve with elevation of the legs and overnight.  Notes that she has been on amlodipine for years before having issues with lower extremity swelling.  Denies chest pain, palpitations, shortness of breath, headaches, and dizziness.  HLD: LDL cholesterol was mildly elevated with low HDL at last check.  She notes this was not a fasting sample and would like to have this rechecked today.  GERD: Symptoms are well controlled with Protonix at 40 mg daily.  She was previously treated with twice daily dosing but has been able to reduce to once daily.  She has not had a worsening of her symptoms and is now wanting to discontinue Protonix completely.  Mood/insomnia: Taking Cymbalta 30 mg daily, tolerating well without side effects.  This medication does seem to work well for her and helps control her symptoms.  Notes that she is no longer having difficulty with sleep but her sleep schedule is off due to third shift work.  When she does come home to sleep on her days off, she sleeps most of the day.  I reviewed the past medical history, family history, social history, surgical history, and allergies today and no changes were needed.  Please see the problem list section below in epic for further details.   Physical exam:   General: Well Developed, well nourished, and in no acute distress.  Neuro: Alert and oriented x3.   HEENT: Normocephalic, atraumatic.  Skin: Warm and dry. Cardiac: Regular rate and rhythm.  No lower extremity edema. Respiratory: Not using accessory muscles, speaking in full sentences.  Impression and Recommendations:    1. Primary hypertension Continue amlodipine 5 mg daily and clonidine 0.1 mg daily.  Blood pressure mildly elevated here in the office.  Recommend maintaining a low-sodium diet as well as incorporating exercise several times weekly.  Aim for weight loss.  Recommend monitoring blood pressure at home with a goal of 130/80 or less.  If consistently higher than that, return for further evaluation and medication management.  2. Mixed hyperlipidemia Checking lipid panel today.  3. Gastroesophageal reflux disease without esophagitis Reducing Protonix to 20 mg daily for the next several weeks and then begin slow taper off.  4. Current severe episode of major depressive disorder without psychotic features without prior episode ([place]) 5. GAD (generalized anxiety disorder) 6. Primary insomnia Continue Cymbalta 30 mg daily as prescribed.  Symptoms are well controlled.  Oversleeping on days off is likely related to third shift work as well as limited sleep on workdays.  Incorporating exercise into her daily routine may help with oversleeping.  Return in about 3 months (around 10/11/2021) for HTN follow up. ___________________________________________ Clearnce Sorrel, DNP, APRN, FNP-BC [facility]

## 2021-07-12 LAB — COMPLETE METABOLIC PANEL WITH GFR
AG Ratio: 1.4 (calc) (ref 1.0–2.5)
ALT: 14 U/L (ref 6–29)
AST: 15 U/L (ref 10–35)
Albumin: 3.6 g/dL (ref 3.6–5.1)
Alkaline phosphatase (APISO): 55 U/L (ref 31–125)
BUN: 10 mg/dL (ref 7–25)
CO2: 26 mmol/L (ref 20–32)
Calcium: 8.6 mg/dL (ref 8.6–10.2)
Chloride: 103 mmol/L (ref 98–110)
Creat: 0.9 mg/dL (ref 0.50–0.99)
Globulin: 2.6 g/dL (calc) (ref 1.9–3.7)
Glucose, Bld: 87 mg/dL (ref 65–99)
Potassium: 3.8 mmol/L (ref 3.5–5.3)
Sodium: 138 mmol/L (ref 135–146)
Total Bilirubin: 0.5 mg/dL (ref 0.2–1.2)
Total Protein: 6.2 g/dL (ref 6.1–8.1)
eGFR: 80 mL/min/{1.73_m2} (ref >=60)

## 2021-07-12 LAB — LIPID PANEL
Cholesterol: 171 mg/dL (ref <200)
HDL: 38 mg/dL — ABNORMAL LOW (ref >=50)
LDL Cholesterol (Calc): 110 mg/dL (calc) — ABNORMAL HIGH
Non-HDL Cholesterol (Calc): 133 mg/dL (calc) — ABNORMAL HIGH (ref <130)
Total CHOL/HDL Ratio: 4.5 (calc) (ref <5.0)
Triglycerides: 120 mg/dL (ref <150)

## 2021-07-14 ENCOUNTER — Other Ambulatory Visit: Payer: Self-pay

## 2021-07-14 ENCOUNTER — Encounter: Payer: Self-pay | Admitting: Gastroenterology

## 2021-07-14 ENCOUNTER — Ambulatory Visit (AMBULATORY_SURGERY_CENTER): Payer: 59 | Admitting: Gastroenterology

## 2021-07-14 VITALS — BP 124/82 | HR 72 | Temp 98.2°F | Resp 14 | Ht 66.75 in | Wt 202.0 lb

## 2021-07-14 DIAGNOSIS — Z1211 Encounter for screening for malignant neoplasm of colon: Secondary | ICD-10-CM

## 2021-07-14 DIAGNOSIS — Z8371 Family history of colonic polyps: Secondary | ICD-10-CM

## 2021-07-14 DIAGNOSIS — D128 Benign neoplasm of rectum: Secondary | ICD-10-CM

## 2021-07-14 DIAGNOSIS — D129 Benign neoplasm of anus and anal canal: Secondary | ICD-10-CM

## 2021-07-14 DIAGNOSIS — K621 Rectal polyp: Secondary | ICD-10-CM

## 2021-07-14 MED ORDER — SODIUM CHLORIDE 0.9 % IV SOLN: 500.0000 mL | Freq: Once | INTRAVENOUS | Status: DC

## 2021-07-14 NOTE — Progress Notes (Signed)
BP 173/120, Labetalol given IV, MD update, vss

## 2021-07-14 NOTE — Progress Notes (Signed)
Called to room to assist during endoscopic procedure.  Patient ID and intended procedure confirmed with present staff. Received instructions for my participation in the procedure from the performing physician.  

## 2021-07-14 NOTE — Progress Notes (Signed)
Report given to PACU, vss 

## 2021-07-14 NOTE — Patient Instructions (Signed)
Information on polyps given to you today.  Await pathology results.  Resume previous diet and medications.  YOU HAD AN ENDOSCOPIC PROCEDURE TODAY AT THE [facility]:   Refer to the procedure report that was given to you for any specific questions about what was found during the examination.  If the procedure report does not answer your questions, please call your gastroenterologist to clarify.  If you requested that your care partner not be given the details of your procedure findings, then the procedure report has been included in a sealed envelope for you to review at your convenience later.  YOU SHOULD EXPECT: Some feelings of bloating in the abdomen. Passage of more gas than usual.  Walking can help get rid of the air that was put into your GI tract during the procedure and reduce the bloating. If you had a lower endoscopy (such as a colonoscopy or flexible sigmoidoscopy) you may notice spotting of blood in your stool or on the toilet paper. If you underwent a bowel prep for your procedure, you may not have a normal bowel movement for a few days.  Please Note:  You might notice some irritation and congestion in your nose or some drainage.  This is from the oxygen used during your procedure.  There is no need for concern and it should clear up in a day or so.  SYMPTOMS TO REPORT IMMEDIATELY:   Following lower endoscopy (colonoscopy or flexible sigmoidoscopy):  Excessive amounts of blood in the stool  Significant tenderness or worsening of abdominal pains  Swelling of the abdomen that is new, acute  Fever of 100F or higher   For urgent or emergent issues, a gastroenterologist can be reached at any hour by calling [phone]. Do not use MyChart messaging for urgent concerns.    DIET:  We do recommend a small meal at first, but then you may proceed to your regular diet.  Drink plenty of fluids but you should avoid alcoholic beverages for 24 hours.  ACTIVITY:  You should  plan to take it easy for the rest of today and you should NOT DRIVE or use heavy machinery until tomorrow (because of the sedation medicines used during the test).    FOLLOW UP: Our staff will call the number listed on your records 48-72 hours following your procedure to check on you and address any questions or concerns that you may have regarding the information given to you following your procedure. If we do not reach you, we will leave a message.  We will attempt to reach you two times.  During this call, we will ask if you have developed any symptoms of COVID 19. If you develop any symptoms (ie: fever, flu-like symptoms, shortness of breath, cough etc.) before then, please call [phone].  If you test positive for Covid 19 in the 2 weeks post procedure, please call and report this information to us.    If any biopsies were taken you will be contacted by phone or by letter within the next 1-3 weeks.  Please call us at [phone] if you have not heard about the biopsies in 3 weeks.    SIGNATURES/CONFIDENTIALITY: You and/or your care partner have signed paperwork which will be entered into your electronic medical record.  These signatures attest to the fact that that the information above on your After Visit Summary has been reviewed and is understood.  Full responsibility of the confidentiality of this discharge information lies with you and/or your care-partner. 

## 2021-07-14 NOTE — Op Note (Signed)
[facility] Patient Name: Mary Robles Procedure Date: 07/14/2021 10:10 AM MRN: [id number] Endoscopist: Thornton Park MD, MD Age: 45 Referring MD:  Date of Birth: [birth year] Gender: Female Account #: [id number] Procedure:                Colonoscopy Indications:              Screening for colorectal malignant neoplasm, This                            is the patient's first colonoscopy                           Mother with colon polyps                           No known family history of colon cancer Medicines:                Monitored Anesthesia Care Procedure:                Pre-Anesthesia Assessment:                           - Prior to the procedure, a History and Physical                            was performed, and patient medications and                            allergies were reviewed. The patient's tolerance of                            previous anesthesia was also reviewed. The risks                            and benefits of the procedure and the sedation                            options and risks were discussed with the patient.                            All questions were answered, and informed consent                            was obtained. Prior Anticoagulants: The patient has                            taken no previous anticoagulant or antiplatelet                            agents. ASA Grade Assessment: II - A patient with                            mild systemic disease. After reviewing the risks  and benefits, the patient was deemed in                            satisfactory condition to undergo the procedure.                           After obtaining informed consent, the colonoscope                            was passed under direct vision. Throughout the                            procedure, the patient's blood pressure, pulse, and                            oxygen saturations were monitored continuously. The                             Olympus CF-HQ190L ([id number]) Colonoscope was                            introduced through the anus and advanced to the 3                            cm into the ileum. A second forward view of the                            right colon was performed. The colonoscopy was                            performed without difficulty. The patient tolerated                            the procedure well. The quality of the bowel                            preparation was good. The terminal ileum, ileocecal                            valve, appendiceal orifice, and rectum were                            photographed. Scope In: 10:24:56 AM Scope Out: 10:39:20 AM Scope Withdrawal Time: 0 hours 11 minutes 24 seconds  Total Procedure Duration: 0 hours 14 minutes 24 seconds  Findings:                 The perianal and digital rectal examinations were                            normal.                           Two sessile polyps were found in the rectum. The  polyps were 2 mm in size. These polyps were removed                            with a cold snare. Resection and retrieval were                            complete. Estimated blood loss was minimal.                           The exam was otherwise without abnormality on                            direct and retroflexion views. Complications:            No immediate complications. Estimated blood loss:                            Minimal. Estimated Blood Loss:     Estimated blood loss was minimal. Impression:               - Two 2 mm polyps in the rectum, removed with a                            cold snare. Resected and retrieved.                           - The examination was otherwise normal on direct                            and retroflexion views. Recommendation:           - Patient has a contact number available for                            emergencies. The signs and symptoms of potential                             delayed complications were discussed with the                            patient. Return to normal activities tomorrow.                            Written discharge instructions were provided to the                            patient.                           - Resume previous diet.                           - Continue present medications.                           - Await pathology results.                           -  Repeat colonoscopy in 5 years for surveillance                            regardless of pathology results given the family                            history.                           - Emerging evidence supports eating a diet of                            fruits, vegetables, grains, calcium, and yogurt                            while reducing red meat and alcohol may reduce the                            risk of colon cancer.                           - Thank you for allowing me to be involved in your                            colon cancer prevention. Thornton Park MD, MD 07/14/2021 10:43:49 AM This report has been signed electronically.

## 2021-07-14 NOTE — Progress Notes (Signed)
Pt's states no medical or surgical changes since previsit or office visit. 

## 2021-07-18 ENCOUNTER — Telehealth: Payer: Self-pay

## 2021-07-18 NOTE — Telephone Encounter (Signed)
  Follow up Call-  Call back number 07/14/2021 06/05/2019  Post procedure Call Back phone  # [phone]  Permission to leave phone message Yes Yes  Some recent data might be hidden     Patient questions:  Do you have a fever, pain , or abdominal swelling? No. Pain Score  0 *  Have you tolerated food without any problems? Yes.    Have you been able to return to your normal activities? Yes.    Do you have any questions about your discharge instructions: Diet   No. Medications  No. Follow up visit  No.  Do you have questions or concerns about your Care? No.  Actions: * If pain score is 4 or above: No action needed, pain <4.

## 2021-07-25 ENCOUNTER — Telehealth (INDEPENDENT_AMBULATORY_CARE_PROVIDER_SITE_OTHER): Payer: 59 | Admitting: Medical-Surgical

## 2021-07-25 ENCOUNTER — Encounter: Payer: Self-pay | Admitting: Gastroenterology

## 2021-07-25 ENCOUNTER — Encounter: Payer: Self-pay | Admitting: Medical-Surgical

## 2021-07-25 DIAGNOSIS — J0110 Acute frontal sinusitis, unspecified: Secondary | ICD-10-CM

## 2021-07-25 DIAGNOSIS — J019 Acute sinusitis, unspecified: Secondary | ICD-10-CM

## 2021-07-25 MED ORDER — AMOXICILLIN-POT CLAVULANATE 875-125 MG PO TABS: 1.0000 | ORAL_TABLET | Freq: Two times a day (BID) | ORAL | 0 refills | Status: AC

## 2021-07-25 MED ORDER — FLUCONAZOLE 150 MG PO TABS: 150.0000 mg | ORAL_TABLET | Freq: Once | ORAL | 0 refills | Status: AC

## 2021-07-25 NOTE — Progress Notes (Signed)
Feels like a sinus infection, started last Wednesday Last Thursday tested covid negative States she was in a house with someone smoking and has had sinus problems since.

## 2021-07-25 NOTE — Progress Notes (Signed)
Virtual Visit via Video Note  I connected with Mary Robles on 07/25/21 at  3:00 PM EDT by a video enabled telemedicine application and verified that I am speaking with the correct person using two identifiers.   I discussed the limitations of evaluation and management by telemedicine and the availability of in person appointments. The patient expressed understanding and agreed to proceed.  Patient location: home Provider locations: office  Subjective:    CC: Sinus congestion x1 week  HPI: Pleasant 45 year old female presenting via MyChart video visit with reports of having severe sinus congestion for the last week.  She has tested for COVID with negative results.  She is having intermittent chills, postnasal drip, headache, facial pain, and eye pressure.  She is taken ibuprofen a couple of times which was somewhat helpful with her discomfort but did not help her sinus congestion.  She is also been using saline nasal sprays/rinses with minimal relief.  Denies fever, cough, shortness of breath, chest pain, and GI symptoms.  Past medical history, Surgical history, Family history not pertinant except as noted below, Social history, Allergies, and medications have been entered into the medical record, reviewed, and corrections made.   Review of Systems: See HPI for pertinent positives and negatives.   Objective:    General: Speaking clearly in complete sentences without any shortness of breath.  Alert and oriented x3.  Normal judgment. No apparent acute distress.  Impression and Recommendations:    1. Acute non-recurrent frontal sinusitis Sending in Augmentin twice daily x10 days.  We will give Diflucan for vulvovaginal candidiasis prophylaxis.  Recommend using Mucinex to help thin secretions.  Continue saline nasal sprays/rinses.  Okay to continue Tylenol/ibuprofen as needed.  Advised against use of decongestant medication as this can interfere with blood pressure control.  I discussed the  assessment and treatment plan with the patient. The patient was provided an opportunity to ask questions and all were answered. The patient agreed with the plan and demonstrated an understanding of the instructions.   The patient was advised to call back or seek an in-person evaluation if the symptoms worsen or if the condition fails to improve as anticipated.  20 minutes of non-face-to-face time was provided during this encounter.  Return if symptoms worsen or fail to improve.  Clearnce Sorrel, DNP, APRN, FNP-BC [facility]

## 2021-08-18 ENCOUNTER — Telehealth: Payer: Self-pay

## 2021-08-18 NOTE — Telephone Encounter (Signed)
Attempted to contact pt by phone with Joy's recommendations.  Received pt's VM.  LMOM for pt that a MyChart message was being sent to her and that she needed to review this message for directions/recommendations.  Charyl Bigger, CMA

## 2021-08-18 NOTE — Telephone Encounter (Signed)
Pt called stating that DJ got her sick and she is requesting medication.  Pt c/o dry cough, face hurting and chills.  She denies fever or SOB.  Please advise.  Charyl Bigger, CMA

## 2021-08-29 NOTE — Progress Notes (Signed)
45 y.o. Mary Robles Divorced [place] American female here for annual exam.    New partner since last office visit.  Accepts STD screening.   Some irritation since using a new laundry detergent.  Denies vaginal dryness.   Hot flashes and night sweats.  Wants hormones tested.   Declines refill of Famvir.   PCP:  Samuel Bouche, NP  Patient's last menstrual period was 04/17/2014.           Sexually active: Yes.    The current method of family planning is status post hysterectomy.    Exercising: No.   Smoker:  no  Health Maintenance: Pap:  01-20-14 Neg:Neg HR HPV History of abnormal Pap:  yes; Yes, hx of dysplasia and cryotherapy to cervix in 2000  MMG:  09/14/20 at [facility], Neg Colonoscopy:  July 2022, 2 polyps.  Next colonoscopy in 5 years.  BMD:   n/a  Result  n/a TDaP:  2013 Gardasil:   no HIV: 2017 Neg Hep C: 2017 Neg Screening Labs:  PCP     reports that she has never smoked. She has never used smokeless tobacco. She reports that she does not currently use alcohol. She reports that she does not use drugs.  Past Medical History:  Diagnosis Date   Allergy    Angio-edema    Anxiety    Asthma    no symptoms in "years", no inhaler   Depression    Fibromyalgia 2021   GERD (gastroesophageal reflux disease)    Herpes simplex    Hiatal hernia    Hyperlipidemia due to dietary fat intake    Hyperlysinemia (HCC)    Hypertension    Hypokalemia    Sickle cell trait (HCC)    Vitamin D deficiency     Past Surgical History:  Procedure Laterality Date   47 HOUR [place] STUDY N/A 07/08/2019   Procedure: 24 HOUR [place] STUDY;  Surgeon: Thornton Park, MD;  Location: [facility];  Service: Gastroenterology;  Laterality: N/A;   ABDOMINAL HYSTERECTOMY N/A 05/04/2014   Procedure: HYSTERECTOMY ABDOMINAL with BILATERAL SALPINGECTOMY ;  Surgeon: Jamey Reas de Berton Lan, MD;  Location: [facility];  Service: Gynecology;  Laterality: N/A;   BREAST BIOPSY Right 12/2014   benign fibroadenoma    ESOPHAGEAL MANOMETRY N/A 07/08/2019   Procedure: ESOPHAGEAL MANOMETRY (EM);  Surgeon: Thornton Park, MD;  Location: [facility];  Service: Gastroenterology;  Laterality: N/A;   FOOT SURGERY     LAPAROTOMY N/A 05/04/2014   Procedure: REMOVAL OF ABD WALL MASS ;  Surgeon: Adin Hector, MD;  Location: [facility];  Service: General;  Laterality: N/A;   LIPOMA EXCISION N/A 05/04/2014   Procedure: EXCISION LIPOMA of abdominal wall;  Surgeon: Jamey Reas de Berton Lan, MD;  Location: [facility];  Service: Gynecology;  Laterality: N/A;   NASAL SINUS SURGERY  2019   PELVIC LAPAROSCOPY Right 2018   oophorectomy   TUBAL LIGATION     UPPER GASTROINTESTINAL ENDOSCOPY      Current Outpatient Medications  Medication Sig Dispense Refill   albuterol (VENTOLIN HFA) 108 (90 Base) MCG/ACT inhaler Inhale 2 puffs into the lungs every 6 (six) hours as needed for wheezing. (Patient not taking: No sig reported) 2 each 11   amLODipine (NORVASC) 5 MG tablet Take 1 tablet (5 mg total) by mouth daily. 90 tablet 3   cetirizine (ZYRTEC) 10 MG tablet Take 1 tablet (10 mg total) by mouth daily. 90 tablet 3   cholecalciferol (VITAMIN D3) 25  MCG (1000 UNIT) tablet Take 1,000 Units by mouth daily.     cloNIDine (CATAPRES) 0.1 MG tablet Take 1 tablet (0.1 mg total) by mouth 2 (two) times daily. 180 tablet 1   DULoxetine (CYMBALTA) 30 MG capsule Take 1 capsule (30 mg total) by mouth daily. 90 capsule 3   EPINEPHrine (EPIPEN 2-PAK) 0.3 mg/0.3 mL IJ SOAJ injection Inject 0.3 mg into the muscle once as needed (for an anaphylactic reaction). 2 each 0   montelukast (SINGULAIR) 10 MG tablet Take 1 tablet (10 mg total) by mouth at bedtime. 90 tablet 3   pantoprazole (PROTONIX) 20 MG tablet Take 1 tablet (20 mg total) by mouth daily. 90 tablet 1   Probiotic Product (PROBIOTIC PO) Take by mouth daily.     Current Facility-Administered Medications  Medication Dose Route Frequency Provider Last Rate Last Admin   0.9 %   sodium chloride infusion  500 mL Intravenous Once Thornton Park, MD        Family History  Problem Relation Age of Onset   Diabetes Mother    Thyroid disease Mother    Colon polyps Mother    Stroke Father    Mental retardation Maternal Aunt    Diabetes Maternal Grandmother    Colon cancer Neg Hx    Esophageal cancer Neg Hx    Stomach cancer Neg Hx    Rectal cancer Neg Hx     Review of Systems  Genitourinary:        Vaginal discomfort for 2 days   Exam:   BP 130/74 (BP Location: Left Arm)   Pulse 90   Resp (!) 98   Ht 5' 5.95" (1.675 m)   Wt 198 lb 9.6 oz (90.1 kg)   LMP 04/17/2014 Comment: hysterectomy w/ single oophorectomy//a.c.  BMI 32.11 kg/m     General appearance: alert, cooperative and appears stated age Head: normocephalic, without obvious abnormality, atraumatic Neck: no adenopathy, supple, symmetrical, trachea midline and thyroid normal to inspection and palpation Lungs: clear to auscultation bilaterally Breasts: normal appearance, no masses or tenderness, No nipple retraction or dimpling, No nipple discharge or bleeding, No axillary adenopathy Heart: regular rate and rhythm Abdomen: soft, non-tender; no masses, no organomegaly Extremities: extremities normal, atraumatic, no cyanosis or edema Skin: skin color, texture, turgor normal. No rashes or lesions Lymph nodes: cervical, supraclavicular, and axillary nodes normal. Neurologic: grossly normal  Pelvic: External genitalia:  no lesions              No abnormal inguinal nodes palpated.              Urethra:  normal appearing urethra with no masses, tenderness or lesions              Bartholins and Skenes: normal                 Vagina: normal appearing vagina with normal color and discharge, no lesions              Cervix: absent              Pap taken: no Bimanual Exam:  Uterus:  absent              Adnexa: no mass, fullness, tenderness              Rectal exam: Yes.  .  Confirms.              Anus:   normal sphincter tone, no lesions  Chaperone was  present for exam:  Sharee Pimple, RN  Assessment:   Well woman visit with gynecologic exam. Status post TAH/bilateral salpingectomy/excision of abdominal wall lipoma. Statu post laparoscopic right oophorectomy/excision of epiploic mass. Left ovary remains.  Hx HSV.  Genital.  Fibromyalgia.  On Cymbalta. Menopausal symptoms.  STD screening.   Plan: Mammogram screening discussed. Self breast awareness reviewed. Pap and HR HPV as above. Guidelines for Calcium, Vitamin D, regular exercise program including cardiovascular and weight bearing exercise. STD screening.  We discussed treating vasomotor symptoms with estrogen, SSRI/SNRI, or gabapentin.  She declines all of these. Follow up annually and prn.    After visit summary provided.

## 2021-08-30 ENCOUNTER — Ambulatory Visit (INDEPENDENT_AMBULATORY_CARE_PROVIDER_SITE_OTHER): Payer: 59 | Admitting: Obstetrics and Gynecology

## 2021-08-30 ENCOUNTER — Other Ambulatory Visit: Payer: Self-pay

## 2021-08-30 ENCOUNTER — Encounter: Payer: Self-pay | Admitting: Obstetrics and Gynecology

## 2021-08-30 VITALS — BP 130/74 | HR 90 | Resp 98 | Ht 65.95 in | Wt 198.6 lb

## 2021-08-30 DIAGNOSIS — Z01419 Encounter for gynecological examination (general) (routine) without abnormal findings: Secondary | ICD-10-CM

## 2021-08-30 DIAGNOSIS — Z113 Encounter for screening for infections with a predominantly sexual mode of transmission: Secondary | ICD-10-CM | POA: Diagnosis not present

## 2021-08-30 DIAGNOSIS — N951 Menopausal and female climacteric states: Secondary | ICD-10-CM | POA: Diagnosis not present

## 2021-08-30 NOTE — Patient Instructions (Signed)

## 2021-08-31 LAB — HIV ANTIBODY (ROUTINE TESTING W REFLEX): HIV 1&2 Ab, 4th Generation: NONREACTIVE

## 2021-08-31 LAB — HEPATITIS C ANTIBODY
Hepatitis C Ab: NONREACTIVE
SIGNAL TO CUT-OFF: 0.01 (ref <1.00)

## 2021-08-31 LAB — ESTRADIOL: Estradiol: 64 pg/mL

## 2021-08-31 LAB — FOLLICLE STIMULATING HORMONE: FSH: 6.8 m[IU]/mL

## 2021-08-31 LAB — HEPATITIS B SURFACE ANTIGEN: Hepatitis B Surface Ag: NONREACTIVE

## 2021-08-31 LAB — RPR: RPR Ser Ql: NONREACTIVE

## 2021-09-01 LAB — SURESWAB CT/NG/T. VAGINALIS
C. trachomatis RNA, TMA: NOT DETECTED
N. gonorrhoeae RNA, TMA: NOT DETECTED
Trichomonas vaginalis RNA: NOT DETECTED

## 2021-09-02 ENCOUNTER — Encounter (HOSPITAL_BASED_OUTPATIENT_CLINIC_OR_DEPARTMENT_OTHER): Payer: Self-pay | Admitting: *Deleted

## 2021-09-02 ENCOUNTER — Other Ambulatory Visit: Payer: Self-pay

## 2021-09-02 ENCOUNTER — Emergency Department (HOSPITAL_BASED_OUTPATIENT_CLINIC_OR_DEPARTMENT_OTHER)
Admission: EM | Admit: 2021-09-02 | Discharge: 2021-09-02 | Disposition: A | Discharge status: Home / Self Care | Payer: 59 | Attending: Emergency Medicine | Admitting: Emergency Medicine

## 2021-09-02 DIAGNOSIS — Z79899 Other long term (current) drug therapy: Secondary | ICD-10-CM | POA: Insufficient documentation

## 2021-09-02 DIAGNOSIS — R0981 Nasal congestion: Secondary | ICD-10-CM | POA: Diagnosis present

## 2021-09-02 DIAGNOSIS — J45909 Unspecified asthma, uncomplicated: Secondary | ICD-10-CM | POA: Diagnosis not present

## 2021-09-02 DIAGNOSIS — I10 Essential (primary) hypertension: Secondary | ICD-10-CM | POA: Diagnosis not present

## 2021-09-02 DIAGNOSIS — J0191 Acute recurrent sinusitis, unspecified: Secondary | ICD-10-CM | POA: Insufficient documentation

## 2021-09-02 MED ORDER — FLUCONAZOLE 150 MG PO TABS: 150.0000 mg | ORAL_TABLET | Freq: Every day | ORAL | 0 refills | Status: DC

## 2021-09-02 MED ORDER — AMOXICILLIN-POT CLAVULANATE 875-125 MG PO TABS: 1.0000 | ORAL_TABLET | Freq: Two times a day (BID) | ORAL | 0 refills | Status: DC

## 2021-09-02 NOTE — ED Triage Notes (Signed)
Facial pressure and ear pain x 2 weeks. Voices concern for sinus infection

## 2021-09-02 NOTE — ED Notes (Signed)
Pt ambulatory to waiting room. Pt verbalized understanding of discharge instructions.   

## 2021-09-02 NOTE — ED Provider Notes (Signed)
[facility] Provider Note   CSN: [id number] Arrival date & time: 09/02/21  1644     History Chief Complaint  Patient presents with   Facial Pain    Mary Robles is a 45 y.o. female.  She has a history of sinus infections and is complaining of 2 weeks of sinus pressure and nasal congestion blocked up ears.  No fevers.  Has had sinus surgery before and she is on Flonase and has been doing other symptomatic treatment.  She says Augmentin usually helps and she also needs Diflucan because she gets a yeast infection when she goes on antibiotics.  She does not have any concern that this is COVID.  The history is provided by the patient.  URI Presenting symptoms: congestion, ear pain and facial pain   Presenting symptoms: no fever   Severity:  Moderate Onset quality:  Gradual Duration:  2 weeks Timing:  Constant Progression:  Worsening Chronicity:  Recurrent Relieved by:  Nothing Worsened by:  Nothing Ineffective treatments:  OTC medications Associated symptoms: sinus pain   Associated symptoms: no neck pain   Risk factors: no sick contacts       Past Medical History:  Diagnosis Date   Allergy    Angio-edema    Anxiety    Asthma    no symptoms in "years", no inhaler   Depression    Fibromyalgia 2021   GERD (gastroesophageal reflux disease)    Herpes simplex    Hiatal hernia    Hyperlipidemia due to dietary fat intake    Hyperlysinemia (HCC)    Hypertension    Hypokalemia    Sickle cell trait ([place])    Vitamin D deficiency     Patient Active Problem List   Diagnosis Date Noted   Fibromyalgia 02/16/2020   Cough    Gastroesophageal reflux disease    Heartburn    Chest pain with low risk for cardiac etiology 09/17/2018   DOE (dyspnea on exertion) 09/17/2018   Obesity (BMI 30.0-34.9) 07/02/2017   MDD (major depressive disorder) 07/16/2016   Asthma, mild 04/11/2015   GAD (generalized anxiety disorder) 02/08/2015   Hyperlipidemia  07/09/2014   Insomnia 07/09/2014   Lipoma of abdominal wall s/p excision 05/04/2014 05/19/2014   Status post total abdominal hysterectomy 05/04/2014   Hypertension    Vitamin D deficiency     Past Surgical History:  Procedure Laterality Date   55 HOUR [place] STUDY N/A 07/08/2019   Procedure: 24 HOUR [place] STUDY;  Surgeon: Thornton Park, MD;  Location: [facility];  Service: Gastroenterology;  Laterality: N/A;   ABDOMINAL HYSTERECTOMY N/A 05/04/2014   Procedure: HYSTERECTOMY ABDOMINAL with BILATERAL SALPINGECTOMY ;  Surgeon: Jamey Reas de Berton Lan, MD;  Location: [facility];  Service: Gynecology;  Laterality: N/A;   BREAST BIOPSY Right 12/2014   benign fibroadenoma   ESOPHAGEAL MANOMETRY N/A 07/08/2019   Procedure: ESOPHAGEAL MANOMETRY (EM);  Surgeon: Thornton Park, MD;  Location: [facility];  Service: Gastroenterology;  Laterality: N/A;   FOOT SURGERY     LAPAROTOMY N/A 05/04/2014   Procedure: REMOVAL OF ABD WALL MASS ;  Surgeon: Adin Hector, MD;  Location: [facility];  Service: General;  Laterality: N/A;   LIPOMA EXCISION N/A 05/04/2014   Procedure: EXCISION LIPOMA of abdominal wall;  Surgeon: Jamey Reas de Berton Lan, MD;  Location: [facility];  Service: Gynecology;  Laterality: N/A;   NASAL SINUS SURGERY  2019   PELVIC LAPAROSCOPY Right 2018  oophorectomy   TUBAL LIGATION     UPPER GASTROINTESTINAL ENDOSCOPY       OB History     Gravida  4   Para  2   Term      Preterm      AB  1   Living  2      SAB      IAB  1   Ectopic      Multiple      Live Births              Family History  Problem Relation Age of Onset   Diabetes Mother    Thyroid disease Mother    Colon polyps Mother    Stroke Father    Mental retardation Maternal Aunt    Diabetes Maternal Grandmother    Colon cancer Neg Hx    Esophageal cancer Neg Hx    Stomach cancer Neg Hx    Rectal cancer Neg Hx     Social History   Tobacco Use   Smoking status: Never    Smokeless tobacco: Never  Vaping Use   Vaping Use: Never used  Substance Use Topics   Alcohol use: Not Currently    Alcohol/week: 0.0 standard drinks   Drug use: Never    Home Medications Prior to Admission medications   Medication Sig Start Date End Date Taking? Authorizing Provider  albuterol (VENTOLIN HFA) 108 (90 Base) MCG/ACT inhaler Inhale 2 puffs into the lungs every 6 (six) hours as needed for wheezing. Patient not taking: No sig reported 02/24/21   Samuel Bouche, NP  amLODipine (NORVASC) 5 MG tablet Take 1 tablet (5 mg total) by mouth daily. 07/11/21   Samuel Bouche, NP  cetirizine (ZYRTEC) 10 MG tablet Take 1 tablet (10 mg total) by mouth daily. 02/24/21   Samuel Bouche, NP  cholecalciferol (VITAMIN D3) 25 MCG (1000 UNIT) tablet Take 1,000 Units by mouth daily.    [provider]  cloNIDine (CATAPRES) 0.1 MG tablet Take 1 tablet (0.1 mg total) by mouth 2 (two) times daily. 07/11/21   Samuel Bouche, NP  DULoxetine (CYMBALTA) 30 MG capsule Take 1 capsule (30 mg total) by mouth daily. 02/24/21   Samuel Bouche, NP  EPINEPHrine (EPIPEN 2-PAK) 0.3 mg/0.3 mL IJ SOAJ injection Inject 0.3 mg into the muscle once as needed (for an anaphylactic reaction). 07/11/21   Samuel Bouche, NP  montelukast (SINGULAIR) 10 MG tablet Take 1 tablet (10 mg total) by mouth at bedtime. 02/24/21   Samuel Bouche, NP  pantoprazole (PROTONIX) 20 MG tablet Take 1 tablet (20 mg total) by mouth daily. 07/11/21   Samuel Bouche, NP  Probiotic Product (PROBIOTIC PO) Take by mouth daily.    [provider]    Allergies    Aldactone [spironolactone], Benazepril, Betadine [povidone iodine], Other, Vicodin [hydrocodone-acetaminophen], and Shellfish allergy  Review of Systems   Review of Systems  Constitutional:  Negative for fever.  HENT:  Positive for congestion, ear pain and sinus pain.   Eyes:  Negative for visual disturbance.  Respiratory:  Negative for shortness of breath.   Cardiovascular:  Negative for chest  pain.  Musculoskeletal:  Negative for neck pain.   Physical Exam Updated Vital Signs BP (!) 137/103 (BP Location: Left Arm)   Pulse (!) 101   Temp 98.6 F (37 C) (Oral)   Resp 20   Ht '5\' 6"'$  (1.676 m)   Wt 89.8 kg   LMP 04/17/2014 Comment: hysterectomy w/ single oophorectomy//a.c.  SpO2  100%   BMI 31.96 kg/m   Physical Exam Constitutional:      Appearance: Normal appearance. She is well-developed.  HENT:     Head: Normocephalic and atraumatic.     Right Ear: Tympanic membrane normal.     Left Ear: Tympanic membrane normal.     Nose: Nose normal.     Mouth/Throat:     Mouth: Mucous membranes are moist.     Pharynx: Oropharynx is clear. Posterior oropharyngeal erythema present. No oropharyngeal exudate.  Eyes:     Conjunctiva/sclera: Conjunctivae normal.  Pulmonary:     Effort: Pulmonary effort is normal.     Breath sounds: Normal breath sounds.  Musculoskeletal:     Cervical back: Neck supple.  Skin:    General: Skin is warm and dry.  Neurological:     General: No focal deficit present.     Mental Status: She is alert.     GCS: GCS eye subscore is 4. GCS verbal subscore is 5. GCS motor subscore is 6.    ED Results / Procedures / Treatments   Labs (all labs ordered are listed, but only abnormal results are displayed) Labs Reviewed - No data to display  EKG None  Radiology No results found.  Procedures Procedures   Medications Ordered in ED Medications - No data to display  ED Course  I have reviewed the triage vital signs and the nursing notes.  Pertinent labs & imaging results that were available during my care of the patient were reviewed by me and considered in my medical decision making (see chart for details).    MDM Rules/Calculators/A&P                          45 year old female here with 2 weeks of sinus symptoms.  She has a history of this.  She is asking for treatment with antibiotics.  She is already on maximal symptomatic treatment  otherwise.  Will cover with Augmentin and she is asking for a dose of Diflucan as she gets yeast infection treatment with antibiotics.  Recommended close follow-up with her treating provider.  Return instructions discussed.  Patient declines COVID testing  Final Clinical Impression(s) / ED Diagnoses Final diagnoses:  Acute recurrent sinusitis, unspecified location    Rx / DC Orders ED Discharge Orders          Ordered    amoxicillin-clavulanate (AUGMENTIN) 875-125 MG tablet  Every 12 hours,   Status:  Discontinued        09/02/21 1835    fluconazole (DIFLUCAN) 150 MG tablet  Daily,   Status:  Discontinued        09/02/21 1835    amoxicillin-clavulanate (AUGMENTIN) 875-125 MG tablet  Every 12 hours        09/02/21 1906    fluconazole (DIFLUCAN) 150 MG tablet  Daily        09/02/21 1906             Hayden Rasmussen, MD 09/03/21 1000

## 2021-09-04 ENCOUNTER — Telehealth: Payer: Self-pay | Admitting: General Practice

## 2021-09-04 NOTE — Telephone Encounter (Signed)
Transition Care Management Follow-up Telephone Call Date of discharge and from where: 09/02/21 from [facility] med center How have you been since you were released from the hospital? Doing a little better. Still has an earache. Currently on Augmentin. Any questions or concerns? No  Items Reviewed: Did the pt receive and understand the discharge instructions provided? Yes  Medications obtained and verified? Yes  Other? No  Any new allergies since your discharge? No  Dietary orders reviewed? Yes Do you have support at home? Yes   Home Care and Equipment/Supplies: Were home health services ordered? no  Functional Questionnaire: (I = Independent and D = Dependent) ADLs: I  Bathing/Dressing- I  Meal Prep- I  Eating- I  Maintaining continence- I  Transferring/Ambulation- I  Managing Meds- I  Follow up appointments reviewed:  PCP Hospital f/u appt confirmed? No  Patient stated that she was told to follow up as needed. [facility] f/u appt confirmed? No   Are transportation arrangements needed? No  If their condition worsens, is the pt aware to call PCP or go to the Emergency Dept.? Yes Was the patient provided with contact information for the PCP's office or ED? Yes Was to pt encouraged to call back with questions or concerns? Yes

## 2021-09-14 ENCOUNTER — Encounter: Payer: Self-pay | Admitting: Family Medicine

## 2021-09-14 ENCOUNTER — Ambulatory Visit (INDEPENDENT_AMBULATORY_CARE_PROVIDER_SITE_OTHER): Payer: 59 | Admitting: Family Medicine

## 2021-09-14 ENCOUNTER — Other Ambulatory Visit: Payer: Self-pay

## 2021-09-14 ENCOUNTER — Ambulatory Visit (INDEPENDENT_AMBULATORY_CARE_PROVIDER_SITE_OTHER): Payer: 59

## 2021-09-14 VITALS — BP 122/85 | HR 82 | Wt 199.1 lb

## 2021-09-14 DIAGNOSIS — J0140 Acute pansinusitis, unspecified: Secondary | ICD-10-CM

## 2021-09-14 DIAGNOSIS — R3129 Other microscopic hematuria: Secondary | ICD-10-CM

## 2021-09-14 DIAGNOSIS — R109 Unspecified abdominal pain: Secondary | ICD-10-CM | POA: Diagnosis not present

## 2021-09-14 DIAGNOSIS — H6122 Impacted cerumen, left ear: Secondary | ICD-10-CM | POA: Diagnosis not present

## 2021-09-14 DIAGNOSIS — R7989 Other specified abnormal findings of blood chemistry: Secondary | ICD-10-CM

## 2021-09-14 LAB — POCT URINALYSIS DIP (CLINITEK)
Bilirubin, UA: NEGATIVE
Glucose, UA: NEGATIVE mg/dL
Ketones, POC UA: NEGATIVE mg/dL
Nitrite, UA: NEGATIVE
POC PROTEIN,UA: NEGATIVE
Spec Grav, UA: 1.025 (ref 1.010–1.025)
Urobilinogen, UA: 0.2 E.U./dL
pH, UA: 6.5 (ref 5.0–8.0)

## 2021-09-14 MED ORDER — METHYLPREDNISOLONE SODIUM SUCC 125 MG IJ SOLR
40.0000 mg | Freq: Once | INTRAMUSCULAR | Status: AC
  Administered 2021-09-14: 40 mg via INTRAMUSCULAR

## 2021-09-14 NOTE — Progress Notes (Signed)
Acute Office Visit  Subjective:    Patient ID: Mary Robles, female    DOB: [birth year], 45 y.o.   MRN: [id number]  Chief Complaint  Patient presents with   Flank Pain    Right side    HPI Patient is in today for R flank pian.  Patient reports Sunday night she was lying in bed and started having some right flank pain radiating into her right abdomen.  States she got up to try to use the bathroom and started feeling sweaty, hot, nauseous, as if she was going to [place] out.  She reports this lasted for about 30 minutes.  She ended up having a bowel movement which was diarrhea.  And went back to bed and started feeling a little bit better.  Reports the right flank pain has continued to be more of a burning, dull discomfort since then.  Reports pain is still 7 out of 10 today.  States she does have associated right low back discomfort as well.  Denies any urinary changes.  No other abdominal pain, fevers, vomiting, blood in urine or stool.  She reports this has happened to her a few times in the past, maybe 2-3 times a year for the past several years.  States she did end up going to the ER for it a few years ago and GI work-up was unremarkable.  She has not noticed these episodes being related to mealtimes or types of food.  She does not have a history of kidney stones.  Additionally she reports she just finished antibiotics for sinus infection a few days ago.  She has noticed some improvement but still has some sinus pressure and drainage.  She would like a steroid injection.  Past Medical History:  Diagnosis Date   Allergy    Angio-edema    Anxiety    Asthma    no symptoms in "years", no inhaler   Depression    Fibromyalgia 2021   GERD (gastroesophageal reflux disease)    Herpes simplex    Hiatal hernia    Hyperlipidemia due to dietary fat intake    Hyperlysinemia (HCC)    Hypertension    Hypokalemia    Sickle cell trait (HCC)    Vitamin D deficiency     Past Surgical  History:  Procedure Laterality Date   33 HOUR [place] STUDY N/A 07/08/2019   Procedure: 24 HOUR [place] STUDY;  Surgeon: Thornton Park, MD;  Location: [facility];  Service: Gastroenterology;  Laterality: N/A;   ABDOMINAL HYSTERECTOMY N/A 05/04/2014   Procedure: HYSTERECTOMY ABDOMINAL with BILATERAL SALPINGECTOMY ;  Surgeon: Jamey Reas de Berton Lan, MD;  Location: [facility];  Service: Gynecology;  Laterality: N/A;   BREAST BIOPSY Right 12/2014   benign fibroadenoma   ESOPHAGEAL MANOMETRY N/A 07/08/2019   Procedure: ESOPHAGEAL MANOMETRY (EM);  Surgeon: Thornton Park, MD;  Location: [facility];  Service: Gastroenterology;  Laterality: N/A;   FOOT SURGERY     LAPAROTOMY N/A 05/04/2014   Procedure: REMOVAL OF ABD WALL MASS ;  Surgeon: Adin Hector, MD;  Location: [facility];  Service: General;  Laterality: N/A;   LIPOMA EXCISION N/A 05/04/2014   Procedure: EXCISION LIPOMA of abdominal wall;  Surgeon: Jamey Reas de Berton Lan, MD;  Location: [facility];  Service: Gynecology;  Laterality: N/A;   NASAL SINUS SURGERY  2019   PELVIC LAPAROSCOPY Right 2018   oophorectomy   TUBAL LIGATION     UPPER GASTROINTESTINAL ENDOSCOPY  Family History  Problem Relation Age of Onset   Diabetes Mother    Thyroid disease Mother    Colon polyps Mother    Stroke Father    Mental retardation Maternal Aunt    Diabetes Maternal Grandmother    Colon cancer Neg Hx    Esophageal cancer Neg Hx    Stomach cancer Neg Hx    Rectal cancer Neg Hx     Social History   Socioeconomic History   Marital status: Divorced    Spouse name: Not on file   Number of children: 2   Years of education: Not on file   Highest education level: Bachelor's degree (e.g., BA, AB, BS)  Occupational History   Occupation: Training and development officer: [employer]  Tobacco Use   Smoking status: Never   Smokeless tobacco: Never  Vaping Use   Vaping Use: Never used  Substance and Sexual Activity    Alcohol use: Not Currently    Alcohol/week: 0.0 standard drinks   Drug use: Never   Sexual activity: Yes    Partners: Male    Birth control/protection: Surgical    Comment: BTSP 2005/TAH/BSO  Other Topics Concern   Not on file  Social History Narrative   Divorced mother of 2.  Lives with her son.  Works in Science writer work.  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   Social Determinants of Health   Financial Resource Strain: Not on file  Food Insecurity: Not on file  Transportation Needs: Not on file  Physical Activity: Not on file  Stress: Not on file  Social Connections: Not on file  Intimate Partner Violence: Not on file    Outpatient Medications Prior to Visit  Medication Sig Dispense Refill   amLODipine (NORVASC) 5 MG tablet Take 1 tablet (5 mg total) by mouth daily. 90 tablet 3   amoxicillin-clavulanate (AUGMENTIN) 875-125 MG tablet Take 1 tablet by mouth every 12 (twelve) hours. 20 tablet 0   cetirizine (ZYRTEC) 10 MG tablet Take 1 tablet (10 mg total) by mouth daily. 90 tablet 3   cholecalciferol (VITAMIN D3) 25 MCG (1000 UNIT) tablet Take 1,000 Units by mouth daily.     cloNIDine (CATAPRES) 0.1 MG tablet Take 1 tablet (0.1 mg total) by mouth 2 (two) times daily. 180 tablet 1   DULoxetine (CYMBALTA) 30 MG capsule Take 1 capsule (30 mg total) by mouth daily. 90 capsule 3   EPINEPHrine (EPIPEN 2-PAK) 0.3 mg/0.3 mL IJ SOAJ injection Inject 0.3 mg into the muscle once as needed (for an anaphylactic reaction). 2 each 0   fluconazole (DIFLUCAN) 150 MG tablet Take 1 tablet (150 mg total) by mouth daily. 1 tablet 0   montelukast (SINGULAIR) 10 MG tablet Take 1 tablet (10 mg total) by mouth at bedtime. 90 tablet 3   pantoprazole (PROTONIX) 20 MG tablet Take 1 tablet (20 mg total) by mouth daily. 90 tablet 1   Probiotic Product (PROBIOTIC PO) Take by mouth daily.     albuterol (VENTOLIN HFA) 108 (90 Base) MCG/ACT inhaler Inhale 2 puffs into the lungs every 6 (six) hours as needed for wheezing.  (Patient not taking: No sig reported) 2 each 11   Facility-Administered Medications Prior to Visit  Medication Dose Route Frequency Provider Last Rate Last Admin   0.9 %  sodium chloride infusion  500 mL Intravenous Once Thornton Park, MD        Allergies  Allergen Reactions   Aldactone [Spironolactone] Swelling   Benazepril Swelling  Lips and face became swollen   Betadine [Povidone Iodine] Other (See Comments)    Blisters    Other Hives, Itching and Other (See Comments)    [place] nuts  [place] nuts    Vicodin [Hydrocodone-Acetaminophen] Other (See Comments)    Hallucinations    Shellfish Allergy Hives and Rash    Crab legs=Hives    Review of Systems All review of systems negative except what is listed in the HPI     Objective:    Physical Exam Vitals reviewed.  Constitutional:      Appearance: Normal appearance.  HENT:     Head: Normocephalic and atraumatic.     Right Ear: Tympanic membrane normal.     Left Ear: There is impacted cerumen.     Nose: Congestion present.     Comments: Moderate ethmoid/maxillary sinus pain on palpation Cardiovascular:     Heart sounds: Normal heart sounds.  Pulmonary:     Breath sounds: Normal breath sounds.  Abdominal:     General: Bowel sounds are normal. There is no distension.     Palpations: Abdomen is soft. There is no mass.     Tenderness: There is no right CVA tenderness, left CVA tenderness, guarding or rebound.     Hernia: No hernia is present.     Comments: Mild soreness per pt to R of umbilicus with palpation  Musculoskeletal:        General: Normal range of motion.     Cervical back: Normal range of motion and neck supple. No tenderness.  Lymphadenopathy:     Cervical: No cervical adenopathy.  Skin:    Findings: No rash.  Neurological:     Mental Status: She is alert and oriented to person, place, and time.  Psychiatric:        Mood and Affect: Mood normal.        Behavior: Behavior normal.         Thought Content: Thought content normal.        Judgment: Judgment normal.    BP 122/85 (BP Location: Left Arm, Patient Position: Sitting, Cuff Size: Large)   Pulse 82   Wt 199 lb 1.3 oz (90.3 kg)   LMP 04/17/2014 Comment: hysterectomy w/ single oophorectomy//a.c.  BMI 32.13 kg/m  Wt Readings from Last 3 Encounters:  09/14/21 199 lb 1.3 oz (90.3 kg)  09/02/21 198 lb (89.8 kg)  08/30/21 198 lb 9.6 oz (90.1 kg)    There are no preventive care reminders to display for this patient.  There are no preventive care reminders to display for this patient.   Lab Results  Component Value Date   TSH 3.04 02/24/2021   Lab Results  Component Value Date   WBC 8.3 02/24/2021   HGB 13.7 02/24/2021   HCT 41.5 02/24/2021   MCV 82.7 02/24/2021   PLT 391 02/24/2021   Lab Results  Component Value Date   NA 138 07/11/2021   K 3.8 07/11/2021   CO2 26 07/11/2021   GLUCOSE 87 07/11/2021   BUN 10 07/11/2021   CREATININE 0.90 07/11/2021   BILITOT 0.5 07/11/2021   ALKPHOS 60 11/20/2019   AST 15 07/11/2021   ALT 14 07/11/2021   PROT 6.2 07/11/2021   ALBUMIN 4.0 11/20/2019   CALCIUM 8.6 07/11/2021   ANIONGAP 10 11/20/2019   EGFR 80 07/11/2021   Lab Results  Component Value Date   CHOL 171 07/11/2021   Lab Results  Component Value Date   HDL 38 (L) 07/11/2021  Lab Results  Component Value Date   LDLCALC 110 (H) 07/11/2021   Lab Results  Component Value Date   TRIG 120 07/11/2021   Lab Results  Component Value Date   CHOLHDL 4.5 07/11/2021   Lab Results  Component Value Date   HGBA1C 5.4 08/31/2020       Assessment & Plan:   1. Rt flank pain 2. Other microscopic hematuria 3-4 Days of 7/10 right flank/abdominal discomfort with hematuria and small leuks on UA (patient reports she has had hematuria in the past, unknown cause). She had a significant GI workup 2 years ago for similar symptoms which was negative overall. Getting urine culture, blood work and CT renal. Will  let patient know results and plan. Patient aware of signs/symptoms requiring further/urgent evaluation.  - POCT URINALYSIS DIP (CLINITEK) - Urine Culture - Urinalysis, Routine w reflex microscopic - CBC with Differential - COMPLETE METABOLIC PANEL WITH GFR - Lipase - Amylase - CT RENAL STONE STUDY; Future  3. Acute non-recurrent pansinusitis Still having some lingering symptoms after course of antibiotics. Offered short prednisone burst, but patient prefers injection. Given solumedrol today.   All review of systems negative except what is listed in the HPI  Follow-up pending results or as needed.   Purcell Nails Olevia Bowens, DNP, FNP-C

## 2021-09-16 LAB — URINALYSIS, ROUTINE W REFLEX MICROSCOPIC
Bacteria, UA: NONE SEEN /HPF
Bilirubin Urine: NEGATIVE
Glucose, UA: NEGATIVE
Hyaline Cast: NONE SEEN /LPF
Nitrite: NEGATIVE
Protein, ur: NEGATIVE
Specific Gravity, Urine: 1.021 (ref 1.001–1.035)
pH: 6.5 (ref 5.0–8.0)

## 2021-09-16 LAB — URINE CULTURE
MICRO NUMBER:: 12443430
Result:: NO GROWTH
SPECIMEN QUALITY:: ADEQUATE

## 2021-09-20 NOTE — Addendum Note (Signed)
Addended by: Caleen Jobs B on: 09/20/2021 11:41 AM   Modules accepted: Orders

## 2021-09-22 LAB — CBC WITH DIFFERENTIAL/PLATELET
Absolute Monocytes: 694 cells/uL (ref 200–950)
Basophils Absolute: 48 cells/uL (ref 0–200)
Basophils Relative: 0.5 %
Eosinophils Absolute: 219 cells/uL (ref 15–500)
Eosinophils Relative: 2.3 %
HCT: 42.3 % (ref 35.0–45.0)
Hemoglobin: 14 g/dL (ref 11.7–15.5)
Lymphs Abs: 3411 cells/uL (ref 850–3900)
MCH: 26.6 pg — ABNORMAL LOW (ref 27.0–33.0)
MCHC: 33.1 g/dL (ref 32.0–36.0)
MCV: 80.4 fL (ref 80.0–100.0)
MPV: 10.8 fL (ref 7.5–12.5)
Monocytes Relative: 7.3 %
Neutro Abs: 5130 cells/uL (ref 1500–7800)
Neutrophils Relative %: 54 %
Platelets: 407 10*3/uL — ABNORMAL HIGH (ref 140–400)
RBC: 5.26 10*6/uL — ABNORMAL HIGH (ref 3.80–5.10)
RDW: 15.9 % — ABNORMAL HIGH (ref 11.0–15.0)
Total Lymphocyte: 35.9 %
WBC: 9.5 10*3/uL (ref 3.8–10.8)

## 2021-09-22 LAB — COMPLETE METABOLIC PANEL WITH GFR
AG Ratio: 1.4 (calc) (ref 1.0–2.5)
ALT: 13 U/L (ref 6–29)
AST: 13 U/L (ref 10–35)
Albumin: 3.9 g/dL (ref 3.6–5.1)
Alkaline phosphatase (APISO): 68 U/L (ref 31–125)
BUN: 10 mg/dL (ref 7–25)
CO2: 27 mmol/L (ref 20–32)
Calcium: 9.2 mg/dL (ref 8.6–10.2)
Chloride: 102 mmol/L (ref 98–110)
Creat: 0.85 mg/dL (ref 0.50–0.99)
Globulin: 2.8 g/dL (calc) (ref 1.9–3.7)
Glucose, Bld: 121 mg/dL (ref 65–139)
Potassium: 4 mmol/L (ref 3.5–5.3)
Sodium: 139 mmol/L (ref 135–146)
Total Bilirubin: 0.4 mg/dL (ref 0.2–1.2)
Total Protein: 6.7 g/dL (ref 6.1–8.1)
eGFR: 86 mL/min/{1.73_m2} (ref >=60)

## 2021-09-22 LAB — IRON,TIBC AND FERRITIN PANEL
%SAT: 11 % (calc) — ABNORMAL LOW (ref 16–45)
Ferritin: 17 ng/mL (ref 16–232)
Iron: 39 ug/dL — ABNORMAL LOW (ref 40–190)
TIBC: 365 mcg/dL (calc) (ref 250–450)

## 2021-09-22 LAB — AMYLASE: Amylase: 38 U/L (ref 21–101)

## 2021-09-22 LAB — LIPASE: Lipase: 40 U/L (ref 7–60)

## 2021-10-03 ENCOUNTER — Other Ambulatory Visit: Payer: Self-pay

## 2021-10-03 DIAGNOSIS — J45909 Unspecified asthma, uncomplicated: Secondary | ICD-10-CM

## 2021-10-03 DIAGNOSIS — Z889 Allergy status to unspecified drugs, medicaments and biological substances status: Secondary | ICD-10-CM

## 2021-10-03 MED ORDER — LEVOCETIRIZINE DIHYDROCHLORIDE 5 MG PO TABS: 5.0000 mg | ORAL_TABLET | Freq: Every evening | ORAL | 1 refills | Status: DC

## 2021-10-10 ENCOUNTER — Ambulatory Visit: Payer: 59 | Admitting: Medical-Surgical

## 2021-10-10 NOTE — Progress Notes (Deleted)
  HPI with pertinent ROS:   CC: HTN follow up  HPI:   I reviewed the past medical history, family history, social history, surgical history, and allergies today and no changes were needed.  Please see the problem list section below in epic for further details.   Physical exam:   General: Well Developed, well nourished, and in no acute distress.  Neuro: Alert and oriented x3.  HEENT: Normocephalic, atraumatic.  Skin: Warm and dry. Cardiac: Regular rate and rhythm, no murmurs rubs or gallops, no lower extremity edema.  Respiratory: Clear to auscultation bilaterally. Not using accessory muscles, speaking in full sentences.  Impression and Recommendations:    1. Primary hypertension ***   No follow-ups on file. ___________________________________________ Clearnce Sorrel, DNP, APRN, FNP-BC [facility] [place]

## 2021-10-16 ENCOUNTER — Other Ambulatory Visit: Payer: Self-pay | Admitting: Medical-Surgical

## 2021-10-16 DIAGNOSIS — Z1231 Encounter for screening mammogram for malignant neoplasm of breast: Secondary | ICD-10-CM

## 2021-10-19 ENCOUNTER — Ambulatory Visit: Payer: 59

## 2021-10-20 ENCOUNTER — Ambulatory Visit: Payer: 59

## 2021-10-30 ENCOUNTER — Other Ambulatory Visit: Payer: Self-pay

## 2021-10-30 ENCOUNTER — Ambulatory Visit: Payer: 59 | Admitting: Medical-Surgical

## 2021-10-30 ENCOUNTER — Encounter: Payer: Self-pay | Admitting: Medical-Surgical

## 2021-10-30 VITALS — BP 135/86 | HR 87 | Resp 20 | Ht 66.0 in | Wt 202.3 lb

## 2021-10-30 DIAGNOSIS — K219 Gastro-esophageal reflux disease without esophagitis: Secondary | ICD-10-CM | POA: Diagnosis not present

## 2021-10-30 DIAGNOSIS — I10 Essential (primary) hypertension: Secondary | ICD-10-CM | POA: Diagnosis not present

## 2021-10-30 DIAGNOSIS — F411 Generalized anxiety disorder: Secondary | ICD-10-CM | POA: Diagnosis not present

## 2021-10-30 DIAGNOSIS — E559 Vitamin D deficiency, unspecified: Secondary | ICD-10-CM

## 2021-10-30 DIAGNOSIS — M797 Fibromyalgia: Secondary | ICD-10-CM

## 2021-10-30 DIAGNOSIS — F322 Major depressive disorder, single episode, severe without psychotic features: Secondary | ICD-10-CM | POA: Diagnosis not present

## 2021-10-30 DIAGNOSIS — E782 Mixed hyperlipidemia: Secondary | ICD-10-CM

## 2021-10-30 MED ORDER — VALSARTAN 160 MG PO TABS: ORAL_TABLET | ORAL | 3 refills | Status: DC

## 2021-10-30 NOTE — Progress Notes (Signed)
  HPI with pertinent ROS:   CC: chronic follow up  HPI: Pleasant 45 year old female presenting today for chronic disease follow up.   HTN- taking Amlodipine 5mg  daily and Clonidine 0.1mg  BID as prescribed. Has had some lower extremity edema although the right foot and knee are worse than the left. Feels that the Amlodipine has caused this and wants to wean herself off of this and try the natural remedy route for BP management. Reports there is a powder that can be taken twice daily along with another pill but does not know the name of it. Has tried a couple of other blood pressure medications and was allergic to them.   Mood/Fibromyalgia- taking Cymbalta 30mg  daily as prescribed, tolerating well without side effects. Feels that this along with Ibuprofen prn helps keep her fibromyalgia pains in control. Having some aches today since she recently went to the beach and walked on the sand, which set off her plantar fasciitis.  HLD- no current medications for this.   Vitamin D deficiency- taking 1,000 units Vitamin D3 daily.   GERD- taking Protonix 20mg  daily, tolerating well without side effects.   I reviewed the past medical history, family history, social history, surgical history, and allergies today and no changes were needed.  Please see the problem list section below in epic for further details.  Physical exam:   General: Well Developed, well nourished, and in no acute distress.  Neuro: Alert and oriented x3.  HEENT: Normocephalic, atraumatic.  Skin: Warm and dry. Cardiac: Regular rate and rhythm, no murmurs rubs or gallops, no lower extremity edema.  Respiratory: Clear to auscultation bilaterally. Not using accessory muscles, speaking in full sentences.  Impression and Recommendations:    1. Primary hypertension BP slightly elevated in office today. Amlodipine could be related to her lower extremity swelling. Since she would like to stop the Amlodipine, switching to Valsartan.  Discussed herbal treatment for her HTN and do not recommend discontinuing medications as I don't feel that herbal supplements will adequately control her blood pressure. Recognize that there may be some benefit to herbal supplements for blood pressure and overall health but since she is currently requiring two medications to get control of her HTN, it will be difficult to get her BP to goal with herbs alone. Continue Clonidine 0.1mg  BID.   2. Gastroesophageal reflux disease without esophagitis Continue Protonix 20mg  daily.   3. Current severe episode of major depressive disorder without psychotic features without prior episode ([place]) 4. GAD (generalized anxiety disorder) 5. Fibromyalgia Continue Cymbalta 30mg  daily.   6. Vitamin D deficiency Continue Vitamin D 1,000 units daily.   7. Mixed hyperlipidemia Recommend dietary modification, increased intentional exercise, and weight loss.   Return in about 2 weeks (around 11/13/2021) for nurse visit for BP check. ___________________________________________ Clearnce Sorrel, DNP, APRN, FNP-BC [facility] [place]

## 2021-11-13 ENCOUNTER — Ambulatory Visit: Payer: 59

## 2021-11-14 ENCOUNTER — Other Ambulatory Visit: Payer: Self-pay | Admitting: Medical-Surgical

## 2021-11-14 DIAGNOSIS — Z1231 Encounter for screening mammogram for malignant neoplasm of breast: Secondary | ICD-10-CM

## 2021-11-15 ENCOUNTER — Encounter: Payer: Self-pay | Admitting: Medical-Surgical

## 2021-11-15 ENCOUNTER — Ambulatory Visit (INDEPENDENT_AMBULATORY_CARE_PROVIDER_SITE_OTHER): Payer: 59

## 2021-11-15 ENCOUNTER — Other Ambulatory Visit: Payer: Self-pay

## 2021-11-15 ENCOUNTER — Ambulatory Visit (INDEPENDENT_AMBULATORY_CARE_PROVIDER_SITE_OTHER): Payer: 59 | Admitting: Medical-Surgical

## 2021-11-15 VITALS — BP 167/95 | HR 100 | Temp 98.1°F

## 2021-11-15 DIAGNOSIS — Z1231 Encounter for screening mammogram for malignant neoplasm of breast: Secondary | ICD-10-CM

## 2021-11-15 DIAGNOSIS — I10 Essential (primary) hypertension: Secondary | ICD-10-CM | POA: Diagnosis not present

## 2021-11-15 MED ORDER — VALSARTAN 320 MG PO TABS: 320.0000 mg | ORAL_TABLET | Freq: Every day | ORAL | 0 refills | Status: DC

## 2021-11-15 NOTE — Progress Notes (Signed)
Patient presents today as a nurse visit for a blood pressure check.  Patient states she is taking her medication as prescribed without any side effects/adverse effects. Medication and allergy list reviewed with patient and the pharmacy has been verified.   HA: No Dizziness/lightheadedness: No Fever: No BA: No Weakness/Fatigue: No  Sinus pain/pressure: No  Runny nose: No  ST: No  ShOB: Yes, on exertion CP: No  Palps: No Abd pain: No Dysuria: No  N/V/C/D: No    Vital Signs at 2:00 PM Blood Pressure: 160/101 Pulse: 109 SpO2: 100%  Vital Signs at 2:10 PM Blood Pressure: 167/95 Pulse: 100 SpO2: 100%   Information shared with Samuel Bouche, FNP who instructed me to tell pt to increase valsartan to 320 mg daily and continue clonidine 0.1 mg as prescribed, continue to monitor BP at home and keep a log, and to schedule a f/u [place] in 2 weeks. I told the pt she could take 2 of the valsartan 160 mg tablets that she already has at home for a total of 320 mg, and I would go ahead and send in a new prescription for the 320 mg tablets to her pharmacy.    Pt aware and verbalized understanding. Pt escorted to check out for scheduling.

## 2021-11-15 NOTE — Patient Instructions (Signed)
-   Increase valsartan to 320 mg daily (you can take 2 of the tablets of 160 mg that you have a home for a total of 320 mg daily). A new prescription for the increased dose has been sent to your pharmacy for you.  - Continue clonidine 0.1 mg as directed - Return in 2 weeks for a nurse visit to recheck your blood pressure

## 2021-11-29 ENCOUNTER — Ambulatory Visit: Payer: 59

## 2021-12-19 ENCOUNTER — Other Ambulatory Visit: Payer: Self-pay

## 2021-12-19 ENCOUNTER — Encounter: Payer: Self-pay | Admitting: Family Medicine

## 2021-12-19 ENCOUNTER — Ambulatory Visit: Payer: 59 | Admitting: Family Medicine

## 2021-12-19 VITALS — BP 138/92 | HR 95 | Temp 98.7°F | Ht 66.0 in | Wt 204.1 lb

## 2021-12-19 DIAGNOSIS — J0140 Acute pansinusitis, unspecified: Secondary | ICD-10-CM

## 2021-12-19 MED ORDER — FLUCONAZOLE 150 MG PO TABS: 150.0000 mg | ORAL_TABLET | Freq: Once | ORAL | 1 refills | Status: AC

## 2021-12-19 MED ORDER — AMOXICILLIN-POT CLAVULANATE 875-125 MG PO TABS: 1.0000 | ORAL_TABLET | Freq: Two times a day (BID) | ORAL | 0 refills | Status: AC

## 2021-12-19 NOTE — Progress Notes (Signed)
Acute Office Visit  Subjective:    Patient ID: Mary Robles, female    DOB: 01-13-1976, 46 y.o.   MRN: [id number]  Chief Complaint  Patient presents with   Facial Pain   Otalgia    bilateral    Patient is in today for sinusitis symptoms.   Patient reports that for the past 5 days she has had worsening sinus pressure, post nasal drip, nasal congestion, rhinorrhea, intermittent left ear pain, snoring, occasional dry cough. She reports there is so much mucus that it makes her nauseous. Her boyfriend had a cold recently, but no other sick contacts. She reports that over the weekend she had some mild chest discomfort and feeling like something was caught in her throat, but none since then. She denies any fevers, vomiting, diarrhea, wheezing, shortness of breath, chest pain.      Past Medical History:  Diagnosis Date   Allergy    Angio-edema    Anxiety    Asthma    no symptoms in "years", no inhaler   Depression    Fibromyalgia 2021   GERD (gastroesophageal reflux disease)    Herpes simplex    Hiatal hernia    Hyperlipidemia due to dietary fat intake    Hyperlysinemia (HCC)    Hypertension    Hypokalemia    Sickle cell trait (HCC)    Vitamin D deficiency     Past Surgical History:  Procedure Laterality Date   71 HOUR [place] STUDY N/A 07/08/2019   Procedure: 24 HOUR [place] STUDY;  Surgeon: Thornton Park, MD;  Location: [facility];  Service: Gastroenterology;  Laterality: N/A;   ABDOMINAL HYSTERECTOMY N/A 05/04/2014   Procedure: HYSTERECTOMY ABDOMINAL with BILATERAL SALPINGECTOMY ;  Surgeon: Jamey Reas de Berton Lan, MD;  Location: [facility];  Service: Gynecology;  Laterality: N/A;   BREAST BIOPSY Right 12/2014   benign fibroadenoma   ESOPHAGEAL MANOMETRY N/A 07/08/2019   Procedure: ESOPHAGEAL MANOMETRY (EM);  Surgeon: Thornton Park, MD;  Location: [facility];  Service: Gastroenterology;  Laterality: N/A;   FOOT SURGERY     LAPAROTOMY N/A 05/04/2014    Procedure: REMOVAL OF ABD WALL MASS ;  Surgeon: Adin Hector, MD;  Location: [facility];  Service: General;  Laterality: N/A;   LIPOMA EXCISION N/A 05/04/2014   Procedure: EXCISION LIPOMA of abdominal wall;  Surgeon: Jamey Reas de Berton Lan, MD;  Location: [facility];  Service: Gynecology;  Laterality: N/A;   NASAL SINUS SURGERY  2019   PELVIC LAPAROSCOPY Right 2018   oophorectomy   TUBAL LIGATION     UPPER GASTROINTESTINAL ENDOSCOPY      Family History  Problem Relation Age of Onset   Diabetes Mother    Thyroid disease Mother    Colon polyps Mother    Stroke Father    Mental retardation Maternal Aunt    Diabetes Maternal Grandmother    Colon cancer Neg Hx    Esophageal cancer Neg Hx    Stomach cancer Neg Hx    Rectal cancer Neg Hx     Social History   Socioeconomic History   Marital status: Divorced    Spouse name: Not on file   Number of children: 2   Years of education: Not on file   Highest education level: Bachelor's degree (e.g., BA, AB, BS)  Occupational History   Occupation: Education officer, museum    Employer: [employer]  Tobacco Use   Smoking status: Never   Smokeless tobacco: Never  Vaping Use   Vaping Use: Never used  Substance and Sexual Activity   Alcohol use: Not Currently    Alcohol/week: 0.0 standard drinks   Drug use: Never   Sexual activity: Yes    Partners: Male    Birth control/protection: Surgical    Comment: BTSP 2005/TAH/BSO  Other Topics Concern   Not on file  Social History Narrative   Divorced mother of 2.  Lives with her son.  Works in Science writer work.  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   Social Determinants of Health   Financial Resource Strain: Not on file  Food Insecurity: Not on file  Transportation Needs: Not on file  Physical Activity: Not on file  Stress: Not on file  Social Connections: Not on file  Intimate Partner Violence: Not on file    Outpatient Medications Prior to Visit  Medication Sig Dispense Refill    cetirizine (ZYRTEC) 10 MG tablet Take 1 tablet (10 mg total) by mouth daily. 90 tablet 3   cholecalciferol (VITAMIN D3) 25 MCG (1000 UNIT) tablet Take 1,000 Units by mouth daily.     cloNIDine (CATAPRES) 0.1 MG tablet Take 1 tablet (0.1 mg total) by mouth 2 (two) times daily. 180 tablet 1   DULoxetine (CYMBALTA) 30 MG capsule Take 1 capsule (30 mg total) by mouth daily. 90 capsule 3   EPINEPHrine (EPIPEN 2-PAK) 0.3 mg/0.3 mL IJ SOAJ injection Inject 0.3 mg into the muscle once as needed (for an anaphylactic reaction). 2 each 0   levocetirizine (XYZAL) 5 MG tablet Take 1 tablet (5 mg total) by mouth every evening. 90 tablet 1   montelukast (SINGULAIR) 10 MG tablet Take 1 tablet (10 mg total) by mouth at bedtime. 90 tablet 3   pantoprazole (PROTONIX) 20 MG tablet Take 1 tablet (20 mg total) by mouth daily. 90 tablet 1   Probiotic Product (PROBIOTIC PO) Take by mouth daily.     valsartan (DIOVAN) 320 MG tablet Take 1 tablet (320 mg total) by mouth daily. 30 tablet 0   Facility-Administered Medications Prior to Visit  Medication Dose Route Frequency Provider Last Rate Last Admin   0.9 %  sodium chloride infusion  500 mL Intravenous Once Thornton Park, MD        Allergies  Allergen Reactions   Aldactone [Spironolactone] Swelling   Benazepril Swelling    Lips and face became swollen   Betadine [Povidone Iodine] Other (See Comments)    Blisters    Other Hives, Itching and Other (See Comments)    [place] nuts  Brazilian nuts    Vicodin [Hydrocodone-Acetaminophen] Other (See Comments)    Hallucinations    Shellfish Allergy Hives and Rash    Crab legs=Hives    Review of systems All review of systems negative except what is listed in the HPI      Objective:    Physical Exam Vitals reviewed.  Constitutional:      Appearance: Normal appearance.  HENT:     Head: Normocephalic and atraumatic.     Left Ear: A middle ear effusion is present.     Nose: Congestion and rhinorrhea  present.     Mouth/Throat:     Mouth: Mucous membranes are moist.     Pharynx: Posterior oropharyngeal erythema present.  Eyes:     Extraocular Movements: Extraocular movements intact.     Conjunctiva/sclera: Conjunctivae normal.  Cardiovascular:     Rate and Rhythm: Normal rate and regular rhythm.  Pulmonary:     Effort: Pulmonary effort  is normal.     Breath sounds: Normal breath sounds. No wheezing, rhonchi or rales.  Musculoskeletal:     Cervical back: Normal range of motion and neck supple.  Skin:    General: Skin is warm and dry.  Neurological:     General: No focal deficit present.     Mental Status: She is alert and oriented to person, place, and time. Mental status is at baseline.  Psychiatric:        Mood and Affect: Mood normal.        Behavior: Behavior normal.        Thought Content: Thought content normal.        Judgment: Judgment normal.    BP (!) 138/92 (BP Location: Left Arm, Patient Position: Sitting, Cuff Size: Large)    Pulse 95    Temp 98.7 F (37.1 C) (Oral)    Ht _0  (1.676 m)    Wt 204 lb 1.3 oz (92.6 kg)    LMP 04/17/2014 Comment: hysterectomy w/ single oophorectomy//a.c.   SpO2 100%    BMI 32.94 kg/m  Wt Readings from Last 3 Encounters:  12/19/21 204 lb 1.3 oz (92.6 kg)  10/30/21 202 lb 4.8 oz (91.8 kg)  09/14/21 199 lb 1.3 oz (90.3 kg)    There are no preventive care reminders to display for this patient.  There are no preventive care reminders to display for this patient.   Lab Results  Component Value Date   TSH 3.04 02/24/2021   Lab Results  Component Value Date   WBC 9.5 09/19/2021   HGB 14.0 09/19/2021   HCT 42.3 09/19/2021   MCV 80.4 09/19/2021   PLT 407 (H) 09/19/2021   Lab Results  Component Value Date   NA 139 09/19/2021   K 4.0 09/19/2021   CO2 27 09/19/2021   GLUCOSE 121 09/19/2021   BUN 10 09/19/2021   CREATININE 0.85 09/19/2021   BILITOT 0.4 09/19/2021   ALKPHOS 60 11/20/2019   AST 13 09/19/2021   ALT 13  09/19/2021   PROT 6.7 09/19/2021   ALBUMIN 4.0 11/20/2019   CALCIUM 9.2 09/19/2021   ANIONGAP 10 11/20/2019   EGFR 86 09/19/2021   Lab Results  Component Value Date   CHOL 171 07/11/2021   Lab Results  Component Value Date   HDL 38 (L) 07/11/2021   Lab Results  Component Value Date   LDLCALC 110 (H) 07/11/2021   Lab Results  Component Value Date   TRIG 120 07/11/2021   Lab Results  Component Value Date   CHOLHDL 4.5 07/11/2021   Lab Results  Component Value Date   HGBA1C 5.4 08/31/2020       Assessment & Plan:   1. Acute non-recurrent pansinusitis Recommend waiting for a few more days to ensure this is not viral sinusitis. Given severity of symptoms, will go ahead and send in Augmentin. (Adding diflucan for possibility of developing yeast infection). Recommend adding Mucinex and Flonase to regimen. Continue supportive measures including rest, hydration, humidifier use, steam showers, warm compresses to sinuses, warm liquids with lemon and honey, and over-the-counter cough, cold, and analgesics as needed.  Patient aware of signs/symptoms requiring further/urgent evaluation.   - amoxicillin-clavulanate (AUGMENTIN) 875-125 MG tablet; Take 1 tablet by mouth 2 (two) times daily for 7 days.  Dispense: 14 tablet; Refill: 0 - fluconazole (DIFLUCAN) 150 MG tablet; Take 1 tablet (150 mg total) by mouth once for 1 dose.  Dispense: 1 tablet; Refill: 1   Follow-up  if symptoms worsen or fail to improve.    Terrilyn Saver, NP

## 2021-12-19 NOTE — Patient Instructions (Signed)
Continue supportive measures including rest, hydration, humidifier use, steam showers, warm compresses to sinuses, warm liquids with lemon and honey, and over-the-counter cough, cold, and analgesics as needed.

## 2021-12-25 ENCOUNTER — Other Ambulatory Visit: Payer: Self-pay | Admitting: Medical-Surgical

## 2021-12-25 DIAGNOSIS — I10 Essential (primary) hypertension: Secondary | ICD-10-CM

## 2022-02-05 ENCOUNTER — Other Ambulatory Visit: Payer: Self-pay | Admitting: Medical-Surgical

## 2022-02-08 ENCOUNTER — Other Ambulatory Visit: Payer: Self-pay

## 2022-02-08 ENCOUNTER — Encounter: Payer: Self-pay | Admitting: Medical-Surgical

## 2022-02-08 ENCOUNTER — Ambulatory Visit: Payer: 59 | Admitting: Medical-Surgical

## 2022-02-08 VITALS — BP 160/102 | HR 108 | Resp 20 | Ht 66.0 in | Wt 204.1 lb

## 2022-02-08 DIAGNOSIS — I10 Essential (primary) hypertension: Secondary | ICD-10-CM | POA: Diagnosis not present

## 2022-02-08 DIAGNOSIS — E611 Iron deficiency: Secondary | ICD-10-CM | POA: Diagnosis not present

## 2022-02-08 DIAGNOSIS — R61 Generalized hyperhidrosis: Secondary | ICD-10-CM | POA: Diagnosis not present

## 2022-02-08 DIAGNOSIS — T753XXA Motion sickness, initial encounter: Secondary | ICD-10-CM | POA: Diagnosis not present

## 2022-02-08 MED ORDER — SCOPOLAMINE 1 MG/3DAYS TD PT72: 1.0000 | MEDICATED_PATCH | TRANSDERMAL | 1 refills | Status: DC

## 2022-02-08 MED ORDER — VALSARTAN-HYDROCHLOROTHIAZIDE 320-25 MG PO TABS: 1.0000 | ORAL_TABLET | Freq: Every day | ORAL | 3 refills | Status: DC

## 2022-02-08 NOTE — Progress Notes (Signed)
°  HPI with pertinent ROS:   CC: HTN  HPI: Pleasant 46 year old female presenting today with concerns of uncontrolled hypertension.  She is taking valsartan 320 mg daily along with clonidine 0.1 mg twice daily.  Is consistent with taking her medications although she does note missing a dose of clonidine a week and a half ago.  Checking her blood pressure periodically with readings running in the 150s-160s over 100s in the past couple of weeks.  She does have a cheeseburger without bread and [place] fries for breakfast every morning.  Notes that if she does not have that, she ends up not eating at all for she goes home and goes to bed.  Has started having night sweats in the last month.  Notes that she only has 1 ovary and she has had periods of night sweats before that lasted for several weeks then spontaneously resolved.  Last iron panel showed mildly low iron levels.  Would like to have this rechecked today.  Is going on a cruise and would like to have a prescription for scopolamine patches for motion sickness.  This will be her first cruise and she is not sure whether she has motion sickness or not but would like to be prepared.  I reviewed the past medical history, family history, social history, surgical history, and allergies today and no changes were needed.  Please see the problem list section below in epic for further details.   Physical exam:   General: Well Developed, well nourished, and in no acute distress.  Neuro: Alert and oriented x3.  HEENT: Normocephalic, atraumatic.  Skin: Warm and dry. Cardiac: Regular rate and rhythm.  Respiratory: Not using accessory muscles, speaking in full sentences.  Impression and Recommendations:    1. Primary hypertension Blood pressure not at goal on arrival or recheck.  Checking labs as below.  Continue clonidine 0.1 mg twice daily.  Changing to valsartan-HCTZ 320-25mg  daily.  New prescription sent to pharmacy.  Recommend monitoring blood  pressure at home with goal of 130/80 or less. - CBC with Differential/Platelet - COMPLETE METABOLIC PANEL WITH GFR - TSH  2. Night sweats Checking TSH. - TSH  3. Iron deficiency Checking iron panel. - Fe+TIBC+Fer  4.  Motion sickness Sending in scopolamine patches every 72 hours, alternating sites while on her upcoming cruise.  Return in about 2 weeks (around 02/22/2022) for nurse visit for BP check. ___________________________________________ Clearnce Sorrel, DNP, APRN, FNP-BC [facility]

## 2022-02-08 NOTE — Patient Instructions (Addendum)

## 2022-02-09 LAB — CBC WITH DIFFERENTIAL/PLATELET
Absolute Monocytes: 799 cells/uL (ref 200–950)
Basophils Absolute: 52 cells/uL (ref 0–200)
Basophils Relative: 0.7 %
Eosinophils Absolute: 118 cells/uL (ref 15–500)
Eosinophils Relative: 1.6 %
HCT: 42.4 % (ref 35.0–45.0)
Hemoglobin: 14 g/dL (ref 11.7–15.5)
Lymphs Abs: 3108 cells/uL (ref 850–3900)
MCH: 26.7 pg — ABNORMAL LOW (ref 27.0–33.0)
MCHC: 33 g/dL (ref 32.0–36.0)
MCV: 80.9 fL (ref 80.0–100.0)
MPV: 11.2 fL (ref 7.5–12.5)
Monocytes Relative: 10.8 %
Neutro Abs: 3323 cells/uL (ref 1500–7800)
Neutrophils Relative %: 44.9 %
Platelets: 401 10*3/uL — ABNORMAL HIGH (ref 140–400)
RBC: 5.24 10*6/uL — ABNORMAL HIGH (ref 3.80–5.10)
RDW: 15.9 % — ABNORMAL HIGH (ref 11.0–15.0)
Total Lymphocyte: 42 %
WBC: 7.4 10*3/uL (ref 3.8–10.8)

## 2022-02-09 LAB — COMPLETE METABOLIC PANEL WITH GFR
AG Ratio: 1.6 (calc) (ref 1.0–2.5)
ALT: 21 U/L (ref 6–29)
AST: 16 U/L (ref 10–35)
Albumin: 4.3 g/dL (ref 3.6–5.1)
Alkaline phosphatase (APISO): 58 U/L (ref 31–125)
BUN: 8 mg/dL (ref 7–25)
CO2: 27 mmol/L (ref 20–32)
Calcium: 9.6 mg/dL (ref 8.6–10.2)
Chloride: 103 mmol/L (ref 98–110)
Creat: 0.96 mg/dL (ref 0.50–0.99)
Globulin: 2.7 g/dL (calc) (ref 1.9–3.7)
Glucose, Bld: 68 mg/dL (ref 65–139)
Potassium: 3.9 mmol/L (ref 3.5–5.3)
Sodium: 139 mmol/L (ref 135–146)
Total Bilirubin: 0.4 mg/dL (ref 0.2–1.2)
Total Protein: 7 g/dL (ref 6.1–8.1)
eGFR: 74 mL/min/{1.73_m2} (ref >=60)

## 2022-02-09 LAB — IRON,TIBC AND FERRITIN PANEL
%SAT: 16 % (calc) (ref 16–45)
Ferritin: 18 ng/mL (ref 16–232)
Iron: 58 ug/dL (ref 40–190)
TIBC: 366 mcg/dL (calc) (ref 250–450)

## 2022-02-09 LAB — TSH: TSH: 3.89 mIU/L

## 2022-02-22 ENCOUNTER — Other Ambulatory Visit: Payer: Self-pay

## 2022-02-22 ENCOUNTER — Ambulatory Visit (INDEPENDENT_AMBULATORY_CARE_PROVIDER_SITE_OTHER): Payer: 59 | Admitting: Medical-Surgical

## 2022-02-22 VITALS — BP 136/78 | HR 101 | Wt 204.0 lb

## 2022-02-22 DIAGNOSIS — I10 Essential (primary) hypertension: Secondary | ICD-10-CM

## 2022-02-22 NOTE — Progress Notes (Signed)
Agree with documentation as above.  ? ?___________________________________________ ?[name], DNP, APRN, FNP-BC ?Primary Care and Sports Medicine ?[facility] ? ?

## 2022-02-22 NOTE — Progress Notes (Signed)
? ?Established Patient Office Visit ? ?Subjective:  ?Patient ID: Mary Robles, female    DOB: [birth year]  Age: 46 y.o. MRN: [id number] ? ?CC:  ?Chief Complaint  ?Patient presents with  ? Hypertension  ? ? ?HPI ?Romell Cavanah Hadaway presents for blood pressure check. Denies trouble sleeping, palpitations, chest pain or shortness of breath ? ?Past Medical History:  ?Diagnosis Date  ? Allergy   ? Angio-edema   ? Anxiety   ? Asthma   ? no symptoms in "years", no inhaler  ? Depression   ? Fibromyalgia 2021  ? GERD (gastroesophageal reflux disease)   ? Herpes simplex   ? Hiatal hernia   ? Hyperlipidemia due to dietary fat intake   ? Hyperlysinemia ([place])   ? Hypertension   ? Hypokalemia   ? Sickle cell trait ([place])   ? Vitamin D deficiency   ? ? ?Past Surgical History:  ?Procedure Laterality Date  ? 30 HOUR [place] STUDY N/A 07/08/2019  ? Procedure: [place] STUDY;  Surgeon: Thornton Park, MD;  Location: [facility];  Service: Gastroenterology;  Laterality: N/A;  ? ABDOMINAL HYSTERECTOMY N/A 05/04/2014  ? Procedure: HYSTERECTOMY ABDOMINAL with BILATERAL SALPINGECTOMY ;  Surgeon: Everardo All Amundson de Berton Lan, MD;  Location: [facility];  Service: Gynecology;  Laterality: N/A;  ? BREAST BIOPSY Right 12/2014  ? benign fibroadenoma  ? ESOPHAGEAL MANOMETRY N/A 07/08/2019  ? Procedure: ESOPHAGEAL MANOMETRY (EM);  Surgeon: Thornton Park, MD;  Location: [facility];  Service: Gastroenterology;  Laterality: N/A;  ? FOOT SURGERY    ? LAPAROTOMY N/A 05/04/2014  ? Procedure: REMOVAL OF ABD WALL MASS ;  Surgeon: Adin Hector, MD;  Location: [facility];  Service: General;  Laterality: N/A;  ? LIPOMA EXCISION N/A 05/04/2014  ? Procedure: EXCISION LIPOMA of abdominal wall;  Surgeon: Jamey Reas de Berton Lan, MD;  Location: [facility];  Service: Gynecology;  Laterality: N/A;  ? NASAL SINUS SURGERY  2019  ? PELVIC LAPAROSCOPY Right 2018  ? oophorectomy  ? TUBAL LIGATION    ? UPPER GASTROINTESTINAL ENDOSCOPY    ? ? ?Family History   ?Problem Relation Age of Onset  ? Diabetes Mother   ? Thyroid disease Mother   ? Colon polyps Mother   ? Stroke Father   ? Mental retardation Maternal Aunt   ? Diabetes Maternal Grandmother   ? Colon cancer Neg Hx   ? Esophageal cancer Neg Hx   ? Stomach cancer Neg Hx   ? Rectal cancer Neg Hx   ? ? ?Social History  ? ?Socioeconomic History  ? Marital status: Divorced  ?  Spouse name: Not on file  ? Number of children: 2  ? Years of education: Not on file  ? Highest education level: Bachelor's degree (e.g., BA, AB, BS)  ?Occupational History  ? Occupation: Education officer, museum  ?  Employer: [employer]  ?Tobacco Use  ? Smoking status: Never  ? Smokeless tobacco: Never  ?Vaping Use  ? Vaping Use: Never used  ?Substance and Sexual Activity  ? Alcohol use: Not Currently  ?  Alcohol/week: 0.0 standard drinks  ? Drug use: Never  ? Sexual activity: Yes  ?  Partners: Male  ?  Birth control/protection: Surgical  ?  Comment: BTSP 2005/TAH/BSO  ?Other Topics Concern  ? Not on file  ?Social History Narrative  ? Divorced mother of 2.  Lives with her son.  Works in Science writer work.  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  ? ?  Social Determinants of Health  ? ?Financial Resource Strain: Not on file  ?Food Insecurity: Not on file  ?Transportation Needs: Not on file  ?Physical Activity: Not on file  ?Stress: Not on file  ?Social Connections: Not on file  ?Intimate Partner Violence: Not on file  ? ? ?Outpatient Medications Prior to Visit  ?Medication Sig Dispense Refill  ? cetirizine (ZYRTEC) 10 MG tablet Take 1 tablet (10 mg total) by mouth daily. 90 tablet 3  ? cholecalciferol (VITAMIN D3) 25 MCG (1000 UNIT) tablet Take 1,000 Units by mouth daily.    ? cloNIDine (CATAPRES) 0.1 MG tablet Take 1 tablet (0.1 mg total) by mouth 2 (two) times daily. NO REFILLS NEEDS HTN CHECK AT OFFICE 180 tablet 0  ? DULoxetine (CYMBALTA) 30 MG capsule Take 1 capsule (30 mg total) by mouth daily. 90 capsule 3  ? EPINEPHrine (EPIPEN 2-PAK) 0.3 mg/0.3 mL IJ SOAJ  injection Inject 0.3 mg into the muscle once as needed (for an anaphylactic reaction). 2 each 0  ? levocetirizine (XYZAL) 5 MG tablet Take 1 tablet (5 mg total) by mouth every evening. 90 tablet 1  ? montelukast (SINGULAIR) 10 MG tablet Take 1 tablet (10 mg total) by mouth at bedtime. 90 tablet 3  ? pantoprazole (PROTONIX) 20 MG tablet Take 1 tablet (20 mg total) by mouth daily. NO REFILLS NEEDS HTN CHECK AT OFFICE 90 tablet 0  ? Probiotic Product (PROBIOTIC PO) Take by mouth daily.    ? scopolamine (TRANSDERM-SCOP) 1 MG/3DAYS Place 1 patch (1.5 mg total) onto the skin every 3 (three) days. 10 patch 1  ? valsartan-hydrochlorothiazide (DIOVAN-HCT) 320-25 MG tablet Take 1 tablet by mouth daily. 90 tablet 3  ? ?Facility-Administered Medications Prior to Visit  ?Medication Dose Route Frequency Provider Last Rate Last Admin  ? 0.9 %  sodium chloride infusion  500 mL Intravenous Once Thornton Park, MD      ? ? ?Allergies  ?Allergen Reactions  ? Aldactone [Spironolactone] Swelling  ? Benazepril Swelling  ?  Lips and face became swollen  ? Betadine [Povidone Iodine] Other (See Comments)  ?  Blisters ?  ? Calcitonin ([place]) Itching  ? Other Hives, Itching and Other (See Comments)  ?  Brazilian nuts  ?Brazilian nuts   ? Vicodin [Hydrocodone-Acetaminophen] Other (See Comments)  ?  Hallucinations ?  ? Shellfish Allergy Hives and Rash  ?  Crab legs=Hives  ? ? ?ROS ?Review of Systems ? ?  ?Objective:  ?  ?Physical Exam ? ?BP 136/78   Pulse (!) 101   Wt 204 lb (92.5 kg)   LMP 04/17/2014 Comment: hysterectomy w/ single oophorectomy//a.c.  SpO2 100%   BMI 32.93 kg/m?  ?Wt Readings from Last 3 Encounters:  ?02/22/22 204 lb (92.5 kg)  ?02/08/22 204 lb 1.6 oz (92.6 kg)  ?12/19/21 204 lb 1.3 oz (92.6 kg)  ? ? ? ?There are no preventive care reminders to display for this patient. ? ?There are no preventive care reminders to display for this patient. ? ?Lab Results  ?Component Value Date  ? TSH 3.89 02/08/2022  ? ?Lab Results   ?Component Value Date  ? WBC 7.4 02/08/2022  ? HGB 14.0 02/08/2022  ? HCT 42.4 02/08/2022  ? MCV 80.9 02/08/2022  ? PLT 401 (H) 02/08/2022  ? ?Lab Results  ?Component Value Date  ? NA 139 02/08/2022  ? K 3.9 02/08/2022  ? CO2 27 02/08/2022  ? GLUCOSE 68 02/08/2022  ? BUN 8 02/08/2022  ? CREATININE 0.96 02/08/2022  ?  BILITOT 0.4 02/08/2022  ? ALKPHOS 60 11/20/2019  ? AST 16 02/08/2022  ? ALT 21 02/08/2022  ? PROT 7.0 02/08/2022  ? ALBUMIN 4.0 11/20/2019  ? CALCIUM 9.6 02/08/2022  ? ANIONGAP 10 11/20/2019  ? EGFR 74 02/08/2022  ? ?Lab Results  ?Component Value Date  ? CHOL 171 07/11/2021  ? ?Lab Results  ?Component Value Date  ? HDL 38 (L) 07/11/2021  ? ?Lab Results  ?Component Value Date  ? LDLCALC 110 (H) 07/11/2021  ? ?Lab Results  ?Component Value Date  ? TRIG 120 07/11/2021  ? ?Lab Results  ?Component Value Date  ? CHOLHDL 4.5 07/11/2021  ? ?Lab Results  ?Component Value Date  ? HGBA1C 5.4 08/31/2020  ? ? ?  ?Assessment & Plan:  ? ?Patient arrived for blood pressure check. It was within normal range. Her pulse is slightly elevated at 101. Consulted provider and advised patient to track this at home and follow up in 3 months. ? ?Problem List Items Addressed This Visit   ?None ? ? ?No orders of the defined types were placed in this encounter. ? ? ?Follow-up: In 3 months with primary care provider. ? ? ?Alesia  Bennett, CMA ?

## 2022-03-07 ENCOUNTER — Other Ambulatory Visit: Payer: Self-pay | Admitting: Medical-Surgical

## 2022-03-07 DIAGNOSIS — J4520 Mild intermittent asthma, uncomplicated: Secondary | ICD-10-CM

## 2022-03-23 ENCOUNTER — Telehealth: Payer: 59 | Admitting: Physician Assistant

## 2022-03-23 DIAGNOSIS — U071 COVID-19: Secondary | ICD-10-CM | POA: Diagnosis not present

## 2022-03-23 MED ORDER — PROMETHAZINE-DM 6.25-15 MG/5ML PO SYRP: 5.0000 mL | ORAL_SOLUTION | Freq: Four times a day (QID) | ORAL | 0 refills | Status: DC | PRN

## 2022-03-23 NOTE — Progress Notes (Signed)
?Virtual Visit Consent  ? ?Mary Robles, you are scheduled for a virtual visit with a [facility] provider today.   ?  ?Just as with appointments in the office, your consent must be obtained to participate.  Your consent will be active for this visit and any virtual visit you may have with one of our providers in the next 365 days.   ?  ?If you have a MyChart account, a copy of this consent can be sent to you electronically.  All virtual visits are billed to your insurance company just like a traditional visit in the office.   ? ?As this is a virtual visit, video technology does not allow for your provider to perform a traditional examination.  This may limit your provider's ability to fully assess your condition.  If your provider identifies any concerns that need to be evaluated in person or the need to arrange testing (such as labs, EKG, etc.), we will make arrangements to do so.   ?  ?Although advances in technology are sophisticated, we cannot ensure that it will always work on either your end or our end.  If the connection with a video visit is poor, the visit may have to be switched to a telephone visit.  With either a video or telephone visit, we are not always able to ensure that we have a secure connection.    ? ?I need to obtain your verbal consent now.   Are you willing to proceed with your visit today?  ?  ?Mary Robles has provided verbal consent on 03/23/2022 for a virtual visit (video or telephone). ?  ?Mar Daring, PA-C  ? ?Date: 03/23/2022 4:02 PM ? ? ?Virtual Visit via Video Note  ? ?Mary Robles, connected with  Mary Robles  ([id number], 01/20/1976) on 03/23/22 at  3:45 PM EDT by a video-enabled telemedicine application and verified that I am speaking with the correct person using two identifiers. ? ?Location: ?Patient: Virtual Visit Location Patient: Home ?Provider: Virtual Visit Location Provider: Home Office ?  ?I discussed the limitations of evaluation and management by  telemedicine and the availability of in person appointments. The patient expressed understanding and agreed to proceed.   ? ?History of Present Illness: ?Mary Robles is a 46 y.o. who identifies as a female who was assigned female at birth, and is being seen today for Covid 7. ? ?HPI: URI  ?This is a new problem. The current episode started today. The problem has been gradually worsening. There has been no fever. Associated symptoms include congestion, coughing, headaches, rhinorrhea and a sore throat (scratchy). Pertinent negatives include no diarrhea, ear pain, nausea, plugged ear sensation, sinus pain or vomiting. Associated symptoms comments: Chills, myalgias. Treatments tried: flonase. The treatment provided no relief.   ?Faint positive line on At Home test ? ?Problems:  ?Patient Active Problem List  ? Diagnosis Date Noted  ? Fibromyalgia 02/16/2020  ? Cough   ? Gastroesophageal reflux disease   ? Heartburn   ? Chest pain with low risk for cardiac etiology 09/17/2018  ? DOE (dyspnea on exertion) 09/17/2018  ? Obesity (BMI 30.0-34.9) 07/02/2017  ? MDD (major depressive disorder) 07/16/2016  ? Asthma, mild 04/11/2015  ? GAD (generalized anxiety disorder) 02/08/2015  ? Hyperlipidemia 07/09/2014  ? Insomnia 07/09/2014  ? Lipoma of abdominal wall s/p excision 05/04/2014 05/19/2014  ? Status post total abdominal hysterectomy 05/04/2014  ? Hypertension   ? Vitamin D deficiency   ?  ?  Allergies:  ?Allergies  ?Allergen Reactions  ? Aldactone [Spironolactone] Swelling  ? Benazepril Swelling  ?  Lips and face became swollen  ? Betadine [Povidone Iodine] Other (See Comments)  ?  Blisters ?  ? Calcitonin ([place]) Itching  ? Other Hives, Itching and Other (See Comments)  ?  Brazilian nuts  ?Brazilian nuts   ? Vicodin [Hydrocodone-Acetaminophen] Other (See Comments)  ?  Hallucinations ?  ? Shellfish Allergy Hives and Rash  ?  Crab legs=Hives  ? ?Medications:  ?Current Outpatient Medications:  ?  cetirizine (ZYRTEC) 10 MG  tablet, Take 1 tablet by mouth once daily, Disp: 30 tablet, Rfl: 0 ?  DULoxetine (CYMBALTA) 30 MG capsule, Take 1 capsule by mouth once daily, Disp: 30 capsule, Rfl: 0 ?  montelukast (SINGULAIR) 10 MG tablet, TAKE 1 TABLET BY MOUTH AT BEDTIME, Disp: 30 tablet, Rfl: 0 ?  promethazine-dextromethorphan (PROMETHAZINE-DM) 6.25-15 MG/5ML syrup, Take 5 mLs by mouth 4 (four) times daily as needed., Disp: 118 mL, Rfl: 0 ?  cholecalciferol (VITAMIN D3) 25 MCG (1000 UNIT) tablet, Take 1,000 Units by mouth daily., Disp: , Rfl:  ?  cloNIDine (CATAPRES) 0.1 MG tablet, Take 1 tablet (0.1 mg total) by mouth 2 (two) times daily. NO REFILLS NEEDS HTN CHECK AT OFFICE, Disp: 180 tablet, Rfl: 0 ?  EPINEPHrine (EPIPEN 2-PAK) 0.3 mg/0.3 mL IJ SOAJ injection, Inject 0.3 mg into the muscle once as needed (for an anaphylactic reaction)., Disp: 2 each, Rfl: 0 ?  levocetirizine (XYZAL) 5 MG tablet, Take 1 tablet (5 mg total) by mouth every evening., Disp: 90 tablet, Rfl: 1 ?  pantoprazole (PROTONIX) 20 MG tablet, Take 1 tablet (20 mg total) by mouth daily. NO REFILLS NEEDS HTN CHECK AT OFFICE, Disp: 90 tablet, Rfl: 0 ?  Probiotic Product (PROBIOTIC PO), Take by mouth daily., Disp: , Rfl:  ?  scopolamine (TRANSDERM-SCOP) 1 MG/3DAYS, Place 1 patch (1.5 mg total) onto the skin every 3 (three) days., Disp: 10 patch, Rfl: 1 ?  valsartan-hydrochlorothiazide (DIOVAN-HCT) 320-25 MG tablet, Take 1 tablet by mouth daily., Disp: 90 tablet, Rfl: 3 ? ?Current Facility-Administered Medications:  ?  0.9 %  sodium chloride infusion, 500 mL, Intravenous, Once, Thornton Park, MD ? ?Observations/Objective: ?Patient is well-developed, well-nourished in no acute distress.  ?Resting comfortably at home.  ?Head is normocephalic, atraumatic.  ?No labored breathing.  ?Speech is clear and coherent with logical content.  ?Patient is alert and oriented at baseline.  ? ? ?Assessment and Plan: ?1. COVID-19 ?- promethazine-dextromethorphan (PROMETHAZINE-DM) 6.25-15  MG/5ML syrup; Take 5 mLs by mouth 4 (four) times daily as needed.  Dispense: 118 mL; Refill: 0 ? ?- Continue OTC symptomatic management of choice ?- Will send OTC vitamins and supplement information through AVS ?- Promethazine DM prescribed ?- Patient enrolled in MyChart symptom monitoring ?- Push fluids ?- Rest as needed ?- Discussed return precautions and when to seek in-person evaluation, sent via AVS as well ? ?Follow Up Instructions: ?I discussed the assessment and treatment plan with the patient. The patient was provided an opportunity to ask questions and all were answered. The patient agreed with the plan and demonstrated an understanding of the instructions.  A copy of instructions were sent to the patient via MyChart unless otherwise noted below.  ? ? ?The patient was advised to call back or seek an in-person evaluation if the symptoms worsen or if the condition fails to improve as anticipated. ? ?Time:  ?I spent 14 minutes with the patient via telehealth technology discussing  the above problems/concerns.   ? ?Mar Daring, PA-C ?

## 2022-03-23 NOTE — Patient Instructions (Signed)
?Mary Robles, thank you for joining Mar Daring, PA-C for today's virtual visit.  While this provider is not your primary care provider (PCP), if your PCP is located in our provider database this encounter information will be shared with them immediately following your visit. ? ?Consent: ?(Patient) Mary Robles provided verbal consent for this virtual visit at the beginning of the encounter. ? ?Current Medications: ? ?Current Outpatient Medications:  ?  cetirizine (ZYRTEC) 10 MG tablet, Take 1 tablet by mouth once daily, Disp: 30 tablet, Rfl: 0 ?  DULoxetine (CYMBALTA) 30 MG capsule, Take 1 capsule by mouth once daily, Disp: 30 capsule, Rfl: 0 ?  montelukast (SINGULAIR) 10 MG tablet, TAKE 1 TABLET BY MOUTH AT BEDTIME, Disp: 30 tablet, Rfl: 0 ?  promethazine-dextromethorphan (PROMETHAZINE-DM) 6.25-15 MG/5ML syrup, Take 5 mLs by mouth 4 (four) times daily as needed., Disp: 118 mL, Rfl: 0 ?  cholecalciferol (VITAMIN D3) 25 MCG (1000 UNIT) tablet, Take 1,000 Units by mouth daily., Disp: , Rfl:  ?  cloNIDine (CATAPRES) 0.1 MG tablet, Take 1 tablet (0.1 mg total) by mouth 2 (two) times daily. NO REFILLS NEEDS HTN CHECK AT OFFICE, Disp: 180 tablet, Rfl: 0 ?  EPINEPHrine (EPIPEN 2-PAK) 0.3 mg/0.3 mL IJ SOAJ injection, Inject 0.3 mg into the muscle once as needed (for an anaphylactic reaction)., Disp: 2 each, Rfl: 0 ?  levocetirizine (XYZAL) 5 MG tablet, Take 1 tablet (5 mg total) by mouth every evening., Disp: 90 tablet, Rfl: 1 ?  pantoprazole (PROTONIX) 20 MG tablet, Take 1 tablet (20 mg total) by mouth daily. NO REFILLS NEEDS HTN CHECK AT OFFICE, Disp: 90 tablet, Rfl: 0 ?  Probiotic Product (PROBIOTIC PO), Take by mouth daily., Disp: , Rfl:  ?  scopolamine (TRANSDERM-SCOP) 1 MG/3DAYS, Place 1 patch (1.5 mg total) onto the skin every 3 (three) days., Disp: 10 patch, Rfl: 1 ?  valsartan-hydrochlorothiazide (DIOVAN-HCT) 320-25 MG tablet, Take 1 tablet by mouth daily., Disp: 90 tablet, Rfl: 3 ? ?Current  Facility-Administered Medications:  ?  0.9 %  sodium chloride infusion, 500 mL, Intravenous, Once, Thornton Park, MD  ? ?Medications ordered in this encounter:  ?Meds ordered this encounter  ?Medications  ? promethazine-dextromethorphan (PROMETHAZINE-DM) 6.25-15 MG/5ML syrup  ?  Sig: Take 5 mLs by mouth 4 (four) times daily as needed.  ?  Dispense:  118 mL  ?  Refill:  0  ?  Order Specific Question:   Supervising Provider  ?  Answer:   Noemi Chapel [[id number]]  ?  ? ?*If you need refills on other medications prior to your next appointment, please contact your pharmacy* ? ?Follow-Up: ?Call back or seek an in-person evaluation if the symptoms worsen or if the condition fails to improve as anticipated. ? ?Other Instructions ? ?10 Things You Can Do to Manage Your COVID-19 Symptoms at Home ?If you have possible or confirmed COVID-19 ?Stay home except to get medical care. ?Monitor your symptoms carefully. If your symptoms get worse, call your healthcare provider immediately. ?Get rest and stay hydrated. ?If you have a medical appointment, call the healthcare provider ahead of time and tell them that you have or may have COVID-19. ?For medical emergencies, call 911 and notify the dispatch personnel that you have or may have COVID-19. ?Cover your cough and sneezes with a tissue or use the inside of your elbow. ?Wash your hands often with soap and water for at least 20 seconds or clean your hands with an alcohol-based hand sanitizer that contains at  least 60% alcohol. ?As much as possible, stay in a specific room and away from other people in your home. Also, you should use a separate bathroom, if available. If you need to be around other people in or outside of the home, wear a mask. ?Avoid sharing personal items with other people in your household, like dishes, towels, and bedding. ?Clean all surfaces that are touched often, like counters, tabletops, and doorknobs. Use household cleaning sprays or wipes according to the  label instructions. ?[email or web address] ?07/01/2020 ?This information is not intended to replace advice given to you by your health care provider. Make sure you discuss any questions you have with your health care provider. ?Document Revised: 08/25/2021 Document Reviewed: 08/25/2021 ?Elsevier Patient Education ? [place]. ? ?Can take to lessen severity: ?Vit C '500mg'$  twice daily ?Quercertin 250-'500mg'$  twice daily ?Zinc 75-'100mg'$  daily ?Melatonin 3-6 mg at bedtime ?Vit D3 1000-2000 IU daily ?Aspirin 81 mg daily with food ?Optional: Famotidine '20mg'$  daily ?Also can add tylenol/ibuprofen as needed for fevers and body aches ?May add Mucinex or Mucinex DM as needed for cough/congestion ? ? ?If you have been instructed to have an in-person evaluation today at a local Urgent Care facility, please use the link below. It will take you to a list of all of our available [facility], including address, phone number and hours of operation. Please do not delay care.  ?[facility] ? ?If you or a family member do not have a primary care provider, use the link below to schedule a visit and establish care. When you choose a [facility] primary care physician or advanced practice provider, you gain a long-term partner in health. ?Find a Primary Care Provider ? ?Learn more about [facility]'s in-office and virtual care options: ?[facility] ?

## 2022-04-04 ENCOUNTER — Other Ambulatory Visit: Payer: Self-pay | Admitting: Medical-Surgical

## 2022-04-04 DIAGNOSIS — J4520 Mild intermittent asthma, uncomplicated: Secondary | ICD-10-CM

## 2022-04-05 ENCOUNTER — Ambulatory Visit: Payer: 59 | Admitting: Family Medicine

## 2022-04-05 ENCOUNTER — Encounter: Payer: Self-pay | Admitting: Family Medicine

## 2022-04-05 VITALS — BP 128/92 | Ht 66.0 in | Wt 195.0 lb

## 2022-04-05 DIAGNOSIS — M722 Plantar fascial fibromatosis: Secondary | ICD-10-CM

## 2022-04-05 NOTE — Progress Notes (Signed)
?  Mary Robles - 46 y.o. female MRN [id number]  Date of birth: 22-Jan-1976 ? ?SUBJECTIVE:  Including CC & ROS.  ?No chief complaint on file. ? ? ?Mary Robles is a 46 y.o. female that is presenting with acute on chronic bilateral [place] fasciitis.  She has tried injections and different braces and splints.  Pain is intermittent in nature.  She has burning sensation. ? ? ? ?Review of Systems ?See HPI  ? ?HISTORY: Past Medical, Surgical, Social, and Family History Reviewed & Updated per EMR.   ?Pertinent Historical Findings include: ? ?Past Medical History:  ?Diagnosis Date  ? Allergy   ? Angio-edema   ? Anxiety   ? Asthma   ? no symptoms in "years", no inhaler  ? Depression   ? Fibromyalgia 2021  ? GERD (gastroesophageal reflux disease)   ? Herpes simplex   ? Hiatal hernia   ? Hyperlipidemia due to dietary fat intake   ? Hyperlysinemia ([place])   ? Hypertension   ? Hypokalemia   ? Sickle cell trait ([place])   ? Vitamin D deficiency   ? ? ?Past Surgical History:  ?Procedure Laterality Date  ? 59 HOUR [place] STUDY N/A 07/08/2019  ? Procedure: [place] STUDY;  Surgeon: Thornton Park, MD;  Location: [facility];  Service: Gastroenterology;  Laterality: N/A;  ? ABDOMINAL HYSTERECTOMY N/A 05/04/2014  ? Procedure: HYSTERECTOMY ABDOMINAL with BILATERAL SALPINGECTOMY ;  Surgeon: Everardo All Amundson de Berton Lan, MD;  Location: [facility];  Service: Gynecology;  Laterality: N/A;  ? BREAST BIOPSY Right 12/2014  ? benign fibroadenoma  ? ESOPHAGEAL MANOMETRY N/A 07/08/2019  ? Procedure: ESOPHAGEAL MANOMETRY (EM);  Surgeon: Thornton Park, MD;  Location: [facility];  Service: Gastroenterology;  Laterality: N/A;  ? FOOT SURGERY    ? LAPAROTOMY N/A 05/04/2014  ? Procedure: REMOVAL OF ABD WALL MASS ;  Surgeon: Adin Hector, MD;  Location: [facility];  Service: General;  Laterality: N/A;  ? LIPOMA EXCISION N/A 05/04/2014  ? Procedure: EXCISION LIPOMA of abdominal wall;  Surgeon: Jamey Reas de Berton Lan, MD;  Location:  [facility];  Service: Gynecology;  Laterality: N/A;  ? NASAL SINUS SURGERY  2019  ? PELVIC LAPAROSCOPY Right 2018  ? oophorectomy  ? TUBAL LIGATION    ? UPPER GASTROINTESTINAL ENDOSCOPY    ? ? ? ?PHYSICAL EXAM:  ?VS: BP (!) 128/92 (BP Location: Left Arm, Patient Position: Sitting)   Ht '5\' 6"'$  (1.676 m)   Wt 195 lb (88.5 kg)   LMP 04/17/2014 Comment: hysterectomy w/ single oophorectomy//a.c.  BMI 31.47 kg/m?  ?Physical Exam ?Gen: NAD, alert, cooperative with exam, well-appearing ?MSK:  ?Neurovascularly intact   ? ?Patient was fitted for a standard, cushioned, semi-rigid orthotic. ?The orthotic was heated and afterward the patient stood on the orthotic blank positioned on the orthotic stand. ?The patient was positioned in subtalar neutral position and 10 degrees of ankle dorsiflexion in a weight bearing stance. ?After completion of molding, a stable base was applied to the orthotic blank. ?The blank was ground to a stable position for weight bearing. ?Size: 8 ?Pairs: 2 ?Base: [facility] EVA ?Additional Posting and Padding: None ?The patient ambulated these, and they were very comfortable. ? ? ? ?ASSESSMENT & PLAN:  ? ?Plantar fasciitis, bilateral ?Acute on chronic in nature.  Has tried several modalities previously. ?-Counseled on home exercise therapy and supportive care. ?-Orthotics. ?-Could consider shockwave therapy, physical therapy or injections. ? ? ? ? ?

## 2022-04-05 NOTE — Patient Instructions (Signed)
Nice to meet you ?Please try the orthotics.  ?Please use ice as needed  ?Please try the exercises   ?Please send me a message in MyChart with any questions or updates.  ?Please see me back in 4 weeks.  ? ?--Dr. Raeford Razor ? ?

## 2022-04-05 NOTE — Assessment & Plan Note (Signed)
Acute on chronic in nature.  Has tried several modalities previously. ?-Counseled on home exercise therapy and supportive care. ?-Orthotics. ?-Could consider shockwave therapy, physical therapy or injections. ?

## 2022-05-07 ENCOUNTER — Other Ambulatory Visit: Payer: Self-pay | Admitting: Medical-Surgical

## 2022-05-09 ENCOUNTER — Other Ambulatory Visit: Payer: Self-pay | Admitting: Medical-Surgical

## 2022-05-09 DIAGNOSIS — J4520 Mild intermittent asthma, uncomplicated: Secondary | ICD-10-CM

## 2022-05-10 ENCOUNTER — Ambulatory Visit: Payer: 59 | Admitting: Family Medicine

## 2022-05-10 ENCOUNTER — Other Ambulatory Visit: Payer: Self-pay | Admitting: Medical-Surgical

## 2022-05-11 ENCOUNTER — Other Ambulatory Visit: Payer: Self-pay | Admitting: Medical-Surgical

## 2022-05-11 DIAGNOSIS — J0100 Acute maxillary sinusitis, unspecified: Secondary | ICD-10-CM

## 2022-05-11 MED ORDER — AMOXICILLIN-POT CLAVULANATE 500-125 MG PO TABS: 1.0000 | ORAL_TABLET | Freq: Two times a day (BID) | ORAL | 0 refills | Status: DC

## 2022-05-11 MED ORDER — FLUCONAZOLE 150 MG PO TABS: 150.0000 mg | ORAL_TABLET | Freq: Once | ORAL | 0 refills | Status: AC

## 2022-05-11 NOTE — Progress Notes (Signed)
Pleasant 46 year old female presenting today with her significant other.  She notes that she has had several days of allergy symptoms that were uncontrolled when she ran out of her medication and did not realize it.  Unfortunately, she has now developed facial pain in the maxillary and frontal areas and she has also developed some dental pain with bending over.  She is having sinus headaches as well as significant congestion.  She does have frequent sinus infections and is requesting treatment for this.  Sending in Augmentin twice daily x7 days with Diflucan for vulvovaginal candidiasis prophylaxis.  ___________________________________________ Clearnce Sorrel, DNP, APRN, FNP-BC Primary Care and [facility]

## 2022-05-25 ENCOUNTER — Ambulatory Visit: Payer: 59 | Admitting: Medical-Surgical

## 2022-05-25 ENCOUNTER — Encounter: Payer: Self-pay | Admitting: Medical-Surgical

## 2022-05-25 VITALS — BP 125/85 | HR 87 | Resp 20 | Ht 66.0 in | Wt 201.6 lb

## 2022-05-25 DIAGNOSIS — J329 Chronic sinusitis, unspecified: Secondary | ICD-10-CM | POA: Diagnosis not present

## 2022-05-25 DIAGNOSIS — I10 Essential (primary) hypertension: Secondary | ICD-10-CM

## 2022-05-25 NOTE — Progress Notes (Signed)
Established Patient Office Visit  Subjective   Patient ID: Mary Robles, female   DOB: [birth year] Age: 46 y.o. MRN: [id number]   Chief Complaint  Patient presents with   Hypertension    HPI Pleasant 46 year old female presenting today for hypertension follow-up. Medication: valsartan-HCTZ 320-'25mg'$  daily  Compliant: yes Side effects: none  Checking BP at home: yes sometimes, readings similar to today Low sodium diet: Yes Exercise: gardening Concerning symptoms: None  Still having sinus congestion with facial pressure.  Completed Augmentin as prescribed but feels that the medication was not helpful and seeing her overall symptoms.  She does have this with this and had sinus surgery in 2019.  Previously saw an ENT provider/surgeon in [place] but would like to have a referral to someplace different for further evaluation.  Continues to have issues with intermittent hot flashes and night sweats.  She does use a sitting fan and a table fan at night that blows on her.  Notes that her symptoms are annoying but manageable at this time.   Objective:    Vitals:   05/25/22 0844  BP: 125/85  Pulse: 87  Resp: 20  Height: '5\' 6"'$  (1.676 m)  Weight: 201 lb 9.6 oz (91.4 kg)  SpO2: 99%  BMI (Calculated): 32.55   Physical Exam Vitals and nursing note reviewed.  Constitutional:      General: She is not in acute distress.    Appearance: Normal appearance. She is not ill-appearing.  HENT:     Head: Normocephalic and atraumatic.     Right Ear: Tympanic membrane, ear canal and external ear normal. There is no impacted cerumen.     Left Ear: Tympanic membrane, ear canal and external ear normal. There is no impacted cerumen.     Nose:     Right Sinus: Maxillary sinus tenderness present.     Left Sinus: Maxillary sinus tenderness present.  Eyes:     General:        Right eye: No discharge.        Left eye: No discharge.     Extraocular Movements: Extraocular movements intact.      Conjunctiva/sclera: Conjunctivae normal.     Pupils: Pupils are equal, round, and reactive to light.  Cardiovascular:     Rate and Rhythm: Normal rate and regular rhythm.     Pulses: Normal pulses.     Heart sounds: Normal heart sounds.  Pulmonary:     Effort: Pulmonary effort is normal. No respiratory distress.     Breath sounds: Normal breath sounds. No wheezing, rhonchi or rales.  Skin:    General: Skin is warm and dry.  Neurological:     Mental Status: She is alert and oriented to person, place, and time.  Psychiatric:        Mood and Affect: Mood normal.        Behavior: Behavior normal.        Thought Content: Thought content normal.        Judgment: Judgment normal.    No results found for this or any previous visit (from the past 24 hour(s)).     The 10-year ASCVD risk score (Arnett DK, et al., 2019) is: 3.7%   Values used to calculate the score:     Age: 23 years     Sex: Female     Is Non-Hispanic African American: Yes     Diabetic: No     Tobacco smoker: No     Systolic Blood Pressure:  125 mmHg     Is BP treated: Yes     HDL Cholesterol: 38 mg/dL     Total Cholesterol: 171 mg/dL   Assessment & Plan:   Hypertension Blood pressure at goal today.  Continue valsartan-HCTZ 320-25 mg daily.  Continue monitoring blood pressure at home with goal of 130/80 or less.  Low-sodium diet recommended.  Regular intentional exercise and weight loss recommended.  Chronic sinusitis Referring to ENT for further evaluation.  Return in about 6 months (around 11/24/2022) for HTN follow up.  ___________________________________________ Clearnce Sorrel, DNP, APRN, FNP-BC Primary Care and [facility]

## 2022-05-25 NOTE — Assessment & Plan Note (Signed)
Blood pressure at goal today.  Continue valsartan-HCTZ 320-25 mg daily.  Continue monitoring blood pressure at home with goal of 130/80 or less.  Low-sodium diet recommended.  Regular intentional exercise and weight loss recommended.

## 2022-05-25 NOTE — Assessment & Plan Note (Signed)
Referring to ENT for further evaluation.

## 2022-05-31 ENCOUNTER — Other Ambulatory Visit: Payer: Self-pay | Admitting: Medical-Surgical

## 2022-06-12 ENCOUNTER — Other Ambulatory Visit: Payer: Self-pay | Admitting: Medical-Surgical

## 2022-06-17 ENCOUNTER — Other Ambulatory Visit: Payer: Self-pay | Admitting: Medical-Surgical

## 2022-07-06 ENCOUNTER — Other Ambulatory Visit: Payer: Self-pay | Admitting: Medical-Surgical

## 2022-08-03 ENCOUNTER — Encounter: Payer: Self-pay | Admitting: Emergency Medicine

## 2022-08-03 ENCOUNTER — Ambulatory Visit
Admission: EM | Admit: 2022-08-03 | Discharge: 2022-08-03 | Disposition: A | Discharge status: Home / Self Care | Payer: 59 | Attending: Family Medicine | Admitting: Family Medicine

## 2022-08-03 DIAGNOSIS — B3731 Acute candidiasis of vulva and vagina: Secondary | ICD-10-CM | POA: Diagnosis not present

## 2022-08-03 DIAGNOSIS — B9689 Other specified bacterial agents as the cause of diseases classified elsewhere: Secondary | ICD-10-CM | POA: Insufficient documentation

## 2022-08-03 DIAGNOSIS — N760 Acute vaginitis: Secondary | ICD-10-CM | POA: Insufficient documentation

## 2022-08-03 DIAGNOSIS — J0101 Acute recurrent maxillary sinusitis: Secondary | ICD-10-CM | POA: Diagnosis not present

## 2022-08-03 DIAGNOSIS — K644 Residual hemorrhoidal skin tags: Secondary | ICD-10-CM | POA: Diagnosis not present

## 2022-08-03 DIAGNOSIS — N898 Other specified noninflammatory disorders of vagina: Secondary | ICD-10-CM

## 2022-08-03 MED ORDER — METHYLPREDNISOLONE ACETATE 80 MG/ML IJ SUSP
80.0000 mg | Freq: Once | INTRAMUSCULAR | Status: AC
  Administered 2022-08-03: 80 mg via INTRAMUSCULAR

## 2022-08-03 MED ORDER — FLUCONAZOLE 150 MG PO TABS: ORAL_TABLET | ORAL | 0 refills | Status: DC

## 2022-08-03 MED ORDER — HYDROCORTISONE ACETATE 25 MG RE SUPP: RECTAL | 0 refills | Status: DC

## 2022-08-03 MED ORDER — AMOXICILLIN-POT CLAVULANATE 875-125 MG PO TABS: 1.0000 | ORAL_TABLET | Freq: Two times a day (BID) | ORAL | 0 refills | Status: DC

## 2022-08-03 NOTE — ED Notes (Signed)
Pt to review AVS via My Chart. RN also called pt to review instructions for meds prescribed - no other questions at this time

## 2022-08-03 NOTE — ED Triage Notes (Addendum)
Sinus pressure x  2 weeks  Has an appt w/ ENT in 2 weeks  OTC saline rinses, allergy meds & flonase No COVID vaccines Also has a painful hemorrhoid x 1 month  OTC Preparation H  - no relief  Here w/ s/o

## 2022-08-03 NOTE — Discharge Instructions (Addendum)
Continue all allergy medications as prescribed. May use Tuck's pads while anal irritation is present. Eat foods that have a lot of fiber in them, such as whole grains, beans, nuts, fruits, and vegetables.

## 2022-08-03 NOTE — ED Provider Notes (Signed)
[facility]    CSN: [id number] Arrival date & time: 08/03/22  1403      History   Chief Complaint Chief Complaint  Patient presents with   Facial Pain    HPI Mary Robles is a 46 y.o. female.   Patient presents with several complaints: 1)  She has had persistent sinus congestion for about two weeks, with gradually developing facial pressure and pain.  She denies URI symptoms such as sore throat and cough.  Her symptoms have not improved despite saline rinses, Singulair, and antihistamines.  She has a history of sinus surgery and recurrent sinusitis. 2)  She complains of a painful hemorrhoid for about a month, without much improvement from Preparation H. 3)  She complains of a vaginal discharge without vaginal pain, abdominal/pelvic pain.  The history is provided by the patient.    Past Medical History:  Diagnosis Date   Allergy    Angio-edema    Anxiety    Asthma    no symptoms in "years", no inhaler   Depression    Fibromyalgia 2021   GERD (gastroesophageal reflux disease)    Herpes simplex    Hiatal hernia    Hyperlipidemia due to dietary fat intake    Hyperlysinemia (HCC)    Hypertension    Hypokalemia    Sickle cell trait (HCC)    Vitamin D deficiency     Patient Active Problem List   Diagnosis Date Noted   Chronic sinusitis 05/25/2022   Plantar fasciitis, bilateral 04/05/2022   Fibromyalgia 02/16/2020   Cough    Gastroesophageal reflux disease    Heartburn    Chest pain with low risk for cardiac etiology 09/17/2018   DOE (dyspnea on exertion) 09/17/2018   Obesity (BMI 30.0-34.9) 07/02/2017   MDD (major depressive disorder) 07/16/2016   Asthma, mild 04/11/2015   GAD (generalized anxiety disorder) 02/08/2015   Hyperlipidemia 07/09/2014   Insomnia 07/09/2014   Lipoma of abdominal wall s/p excision 05/04/2014 05/19/2014   Status post total abdominal hysterectomy 05/04/2014   Hypertension    Vitamin D deficiency     Past Surgical  History:  Procedure Laterality Date   53 HOUR [place] STUDY N/A 07/08/2019   Procedure: 24 HOUR [place] STUDY;  Surgeon: Thornton Park, MD;  Location: [facility];  Service: Gastroenterology;  Laterality: N/A;   ABDOMINAL HYSTERECTOMY N/A 05/04/2014   Procedure: HYSTERECTOMY ABDOMINAL with BILATERAL SALPINGECTOMY ;  Surgeon: Jamey Reas de Berton Lan, MD;  Location: [facility];  Service: Gynecology;  Laterality: N/A;   BREAST BIOPSY Right 12/2014   benign fibroadenoma   ESOPHAGEAL MANOMETRY N/A 07/08/2019   Procedure: ESOPHAGEAL MANOMETRY (EM);  Surgeon: Thornton Park, MD;  Location: [facility];  Service: Gastroenterology;  Laterality: N/A;   FOOT SURGERY     LAPAROTOMY N/A 05/04/2014   Procedure: REMOVAL OF ABD WALL MASS ;  Surgeon: Adin Hector, MD;  Location: [facility];  Service: General;  Laterality: N/A;   LIPOMA EXCISION N/A 05/04/2014   Procedure: EXCISION LIPOMA of abdominal wall;  Surgeon: Jamey Reas de Berton Lan, MD;  Location: [facility];  Service: Gynecology;  Laterality: N/A;   NASAL SINUS SURGERY  2019   PELVIC LAPAROSCOPY Right 2018   oophorectomy   TUBAL LIGATION     UPPER GASTROINTESTINAL ENDOSCOPY      OB History     Gravida  4   Para  2   Term      Preterm  AB  1   Living  2      SAB      IAB  1   Ectopic      Multiple      Live Births               Home Medications    Prior to Admission medications   Medication Sig Start Date End Date Taking? Authorizing Provider  amoxicillin-clavulanate (AUGMENTIN) 875-125 MG tablet Take 1 tablet by mouth 2 (two) times daily. Take with food. 08/03/22  Yes Kandra Nicolas, MD  docusate sodium (COLACE) 100 MG capsule Take 100 mg by mouth daily as needed for mild constipation.   Yes [provider]  fluconazole (DIFLUCAN) 150 MG tablet Take one tab PO once.  May repeat in 72 hours. 08/03/22  Yes Kandra Nicolas, MD  hydrocortisone (ANUSOL-HC) 25 MG suppository Place one  suppository PR Q12hr 08/03/22  Yes Kandra Nicolas, MD  cetirizine (ZYRTEC) 10 MG tablet Take 1 tablet by mouth once daily 05/10/22   Samuel Bouche, NP  cholecalciferol (VITAMIN D3) 25 MCG (1000 UNIT) tablet Take 1,000 Units by mouth daily.    [provider]  cloNIDine (CATAPRES) 0.1 MG tablet TAKE 1 TABLET BY MOUTH TWICE DAILY (NEED  HTN  CHECK  AT  OFFICE) 06/20/22   Samuel Bouche, NP  DULoxetine (CYMBALTA) 30 MG capsule Take 1 capsule by mouth once daily 07/09/22   Samuel Bouche, NP  EPINEPHrine (EPIPEN 2-PAK) 0.3 mg/0.3 mL IJ SOAJ injection Inject 0.3 mg into the muscle once as needed (for an anaphylactic reaction). 07/11/21   Samuel Bouche, NP  levocetirizine (XYZAL) 5 MG tablet Take 1 tablet (5 mg total) by mouth every evening. 10/03/21   Samuel Bouche, NP  montelukast (SINGULAIR) 10 MG tablet TAKE 1 TABLET BY MOUTH AT BEDTIME 05/10/22   Samuel Bouche, NP  pantoprazole (PROTONIX) 20 MG tablet Take 1 tablet (20 mg total) by mouth daily. 05/31/22   Samuel Bouche, NP  Probiotic Product (PROBIOTIC PO) Take by mouth daily.    [provider]  promethazine-dextromethorphan (PROMETHAZINE-DM) 6.25-15 MG/5ML syrup Take 5 mLs by mouth 4 (four) times daily as needed. Patient not taking: Reported on 08/03/2022 03/23/22   Mar Daring, PA-C  scopolamine (TRANSDERM-SCOP) 1 MG/3DAYS Place 1 patch (1.5 mg total) onto the skin every 3 (three) days. Patient not taking: Reported on 08/03/2022 02/08/22   Samuel Bouche, NP  valsartan-hydrochlorothiazide (DIOVAN-HCT) 320-25 MG tablet Take 1 tablet by mouth daily. 02/08/22   Samuel Bouche, NP    Family History Family History  Problem Relation Age of Onset   Diabetes Mother    Thyroid disease Mother    Colon polyps Mother    Stroke Father    Mental retardation Maternal Aunt    Diabetes Maternal Grandmother    Colon cancer Neg Hx    Esophageal cancer Neg Hx    Stomach cancer Neg Hx    Rectal cancer Neg Hx     Social History Social History   Tobacco Use    Smoking status: Never   Smokeless tobacco: Never  Vaping Use   Vaping Use: Never used  Substance Use Topics   Alcohol use: Not Currently    Alcohol/week: 0.0 standard drinks of alcohol   Drug use: Never     Allergies   Aldactone [spironolactone], Benazepril, Betadine [povidone iodine], Calcitonin (salmon), Other, Vicodin [hydrocodone-acetaminophen], and Shellfish allergy   Review of Systems Review of Systems  Constitutional:  Negative for  appetite change, chills, diaphoresis, fatigue and fever.  HENT:  Positive for congestion, postnasal drip, rhinorrhea, sinus pressure and sinus pain. Negative for ear discharge, ear pain, facial swelling and sore throat.   Eyes: Negative.   Respiratory: Negative.    Cardiovascular: Negative.   Gastrointestinal:  Positive for rectal pain. Negative for abdominal pain and nausea.       Positive hemorroid  Genitourinary:  Positive for vaginal discharge. Negative for dysuria, genital sores, pelvic pain, urgency, vaginal bleeding and vaginal pain.  Musculoskeletal: Negative.   Neurological: Negative.   Hematological:  Negative for adenopathy.     Physical Exam Triage Vital Signs ED Triage Vitals  Enc Vitals Group     BP 08/03/22 1431 (!) 142/92     Pulse Rate 08/03/22 1431 76     Resp 08/03/22 1431 16     Temp 08/03/22 1431 99.1 F (37.3 C)     Temp Source 08/03/22 1431 Oral     SpO2 08/03/22 1431 100 %     Weight 08/03/22 1433 202 lb (91.6 kg)     Height 08/03/22 1433 '5\' 6"'$  (1.676 m)     Head Circumference --      Peak Flow --      Pain Score 08/03/22 1432 8     Pain Loc --      Pain Edu? --      Excl. in [place]? --    No data found.  Updated Vital Signs BP (!) 142/92 (BP Location: Right Arm)   Pulse 76   Temp 99.1 F (37.3 C) (Oral)   Resp 16   Ht '5\' 6"'$  (1.676 m)   Wt 91.6 kg   LMP 04/17/2014 Comment: hysterectomy w/ single oophorectomy//a.c.  SpO2 100%   BMI 32.60 kg/m   Visual Acuity Right Eye Distance:   Left Eye  Distance:   Bilateral Distance:    Right Eye Near:   Left Eye Near:    Bilateral Near:     Physical Exam Vitals and nursing note reviewed. Exam conducted with a chaperone present.  Constitutional:      General: She is not in acute distress. HENT:     Head: Normocephalic.     Right Ear: Tympanic membrane and ear canal normal.     Left Ear: Tympanic membrane and ear canal normal.     Nose: Congestion present.     Comments: Bilateral maxillary sinus tenderness present    Mouth/Throat:     Mouth: Mucous membranes are moist.     Pharynx: Oropharynx is clear.  Eyes:     Conjunctiva/sclera: Conjunctivae normal.     Pupils: Pupils are equal, round, and reactive to light.  Cardiovascular:     Rate and Rhythm: Normal rate.  Pulmonary:     Effort: Pulmonary effort is normal.  Genitourinary:      Comments: Anus reveals a 1.5cm by 19m non-thrombosed hemorrhoid. Introitus reveals presence of white discharge. Musculoskeletal:     Cervical back: Neck supple.     Right lower leg: No edema.     Left lower leg: No edema.  Lymphadenopathy:     Cervical: No cervical adenopathy.  Skin:    General: Skin is warm and dry.     Findings: No rash.  Neurological:     Mental Status: She is alert and oriented to person, place, and time.      UC Treatments / Results  Labs (all labs ordered are listed, but only abnormal results are displayed) Labs  Reviewed  CERVICOVAGINAL ANCILLARY ONLY    EKG   Radiology No results found.  Procedures Procedures (including critical care time)  Medications Ordered in UC Medications  methylPREDNISolone acetate (DEPO-MEDROL) injection 80 mg (80 mg Intramuscular Given 08/03/22 1508)    Initial Impression / Assessment and Plan / UC Course  I have reviewed the triage vital signs and the nursing notes.  Pertinent labs & imaging results that were available during my care of the patient were reviewed by me and considered in my medical decision making (see  chart for details).    Administered Depo Medrol '80mg'$  IM.  Begin Augmentin. Rx for Diflucan at patient's request. Rx for Anusol [facility] suppositories. Cervicovaginal ancillary pending. Followup with Family Doctor if not improved in one week.   Final Clinical Impressions(s) / UC Diagnoses   Final diagnoses:  Acute recurrent maxillary sinusitis  External hemorrhoid  Vaginal discharge     Discharge Instructions      Continue all allergy medications as prescribed. May use Tuck's pads while anal irritation is present. Eat foods that have a lot of fiber in them, such as whole grains, beans, nuts, fruits, and vegetables.     ED Prescriptions     Medication Sig Dispense Auth. Provider   amoxicillin-clavulanate (AUGMENTIN) 875-125 MG tablet Take 1 tablet by mouth 2 (two) times daily. Take with food. 14 tablet Kandra Nicolas, MD   fluconazole (DIFLUCAN) 150 MG tablet Take one tab PO once.  May repeat in 72 hours. 2 tablet Kandra Nicolas, MD   hydrocortisone (ANUSOL-HC) 25 MG suppository Place one suppository PR Q12hr 24 suppository Assunta Found, Ishmael Holter, MD         Kandra Nicolas, MD 08/03/22 [phone]

## 2022-08-04 ENCOUNTER — Telehealth: Payer: Self-pay | Admitting: Emergency Medicine

## 2022-08-04 NOTE — Telephone Encounter (Signed)
Spoke with patient states that she is doing ok.  Will follow up as needed.

## 2022-08-06 LAB — CERVICOVAGINAL ANCILLARY ONLY
Bacterial Vaginitis (gardnerella): POSITIVE — AB
Candida Glabrata: NEGATIVE
Candida Vaginitis: POSITIVE — AB
Chlamydia: NEGATIVE
Comment: NEGATIVE
Comment: NEGATIVE
Comment: NEGATIVE
Comment: NEGATIVE
Comment: NEGATIVE
Comment: NORMAL
Neisseria Gonorrhea: NEGATIVE
Trichomonas: NEGATIVE

## 2022-08-07 ENCOUNTER — Telehealth (HOSPITAL_COMMUNITY): Payer: Self-pay | Admitting: Emergency Medicine

## 2022-08-07 MED ORDER — METRONIDAZOLE 500 MG PO TABS: 500.0000 mg | ORAL_TABLET | Freq: Two times a day (BID) | ORAL | 0 refills | Status: DC

## 2022-08-16 ENCOUNTER — Encounter (HOSPITAL_BASED_OUTPATIENT_CLINIC_OR_DEPARTMENT_OTHER): Payer: Self-pay | Admitting: Emergency Medicine

## 2022-08-16 ENCOUNTER — Other Ambulatory Visit: Payer: Self-pay

## 2022-08-16 ENCOUNTER — Emergency Department (HOSPITAL_BASED_OUTPATIENT_CLINIC_OR_DEPARTMENT_OTHER)
Admission: EM | Admit: 2022-08-16 | Discharge: 2022-08-16 | Disposition: A | Discharge status: Home / Self Care | Payer: 59 | Attending: Emergency Medicine | Admitting: Emergency Medicine

## 2022-08-16 ENCOUNTER — Emergency Department (HOSPITAL_BASED_OUTPATIENT_CLINIC_OR_DEPARTMENT_OTHER): Payer: 59

## 2022-08-16 DIAGNOSIS — W01198A Fall on same level from slipping, tripping and stumbling with subsequent striking against other object, initial encounter: Secondary | ICD-10-CM | POA: Diagnosis not present

## 2022-08-16 DIAGNOSIS — S80211A Abrasion, right knee, initial encounter: Secondary | ICD-10-CM | POA: Diagnosis not present

## 2022-08-16 DIAGNOSIS — Z23 Encounter for immunization: Secondary | ICD-10-CM | POA: Diagnosis not present

## 2022-08-16 DIAGNOSIS — W19XXXA Unspecified fall, initial encounter: Secondary | ICD-10-CM

## 2022-08-16 DIAGNOSIS — S80911A Unspecified superficial injury of right knee, initial encounter: Secondary | ICD-10-CM | POA: Diagnosis present

## 2022-08-16 MED ORDER — TETANUS-DIPHTH-ACELL PERTUSSIS 5-2.5-18.5 LF-MCG/0.5 IM SUSY
0.5000 mL | PREFILLED_SYRINGE | Freq: Once | INTRAMUSCULAR | Status: AC
Start: 2022-08-16 — End: 2022-08-16
  Administered 2022-08-16: 0.5 mL via INTRAMUSCULAR
  Filled 2022-08-16: qty 0.5

## 2022-08-16 NOTE — ED Triage Notes (Signed)
Pt tripped and fell on concrete, c/o right knee and thigh pain. Abrasion to right knee

## 2022-08-16 NOTE — Discharge Instructions (Signed)
Rinse your wound out as best you can when you get home.  You can apply an ointment to it a couple times a day if you like.  Please return for rapid spreading redness or if you develop fever.

## 2022-08-16 NOTE — ED Notes (Signed)
Patient has a small abrasion on her right knee. All hemorrhage is controlled.

## 2022-08-16 NOTE — ED Provider Notes (Signed)
[place] EMERGENCY DEPARTMENT Provider Note   CSN: [id number] Arrival date & time: 08/16/22  1943     History  Chief Complaint  Patient presents with   Mary Robles is a 46 y.o. female.  45 yo F with a chief complaint of a fall.  The patient was walking on the sidewalk and she tripped and landed on the knee.  She denies any other injury.  And suffered a wound to the outside of the knee.  Has some pain but is able to bear weight.  Feels like it radiates up to the thigh.   Fall       Home Medications Prior to Admission medications   Medication Sig Start Date End Date Taking? Authorizing Provider  amoxicillin-clavulanate (AUGMENTIN) 875-125 MG tablet Take 1 tablet by mouth 2 (two) times daily. Take with food. 08/03/22   Kandra Nicolas, MD  cetirizine (ZYRTEC) 10 MG tablet Take 1 tablet by mouth once daily 05/10/22   Samuel Bouche, NP  cholecalciferol (VITAMIN D3) 25 MCG (1000 UNIT) tablet Take 1,000 Units by mouth daily.    [provider]  cloNIDine (CATAPRES) 0.1 MG tablet TAKE 1 TABLET BY MOUTH TWICE DAILY (NEED  HTN  CHECK  AT  OFFICE) 06/20/22   Samuel Bouche, NP  docusate sodium (COLACE) 100 MG capsule Take 100 mg by mouth daily as needed for mild constipation.    [provider]  DULoxetine (CYMBALTA) 30 MG capsule Take 1 capsule by mouth once daily 07/09/22   Samuel Bouche, NP  EPINEPHrine (EPIPEN 2-PAK) 0.3 mg/0.3 mL IJ SOAJ injection Inject 0.3 mg into the muscle once as needed (for an anaphylactic reaction). 07/11/21   Samuel Bouche, NP  fluconazole (DIFLUCAN) 150 MG tablet Take one tab PO once.  May repeat in 72 hours. 08/03/22   Kandra Nicolas, MD  hydrocortisone (ANUSOL-HC) 25 MG suppository Place one suppository PR Q12hr 08/03/22   Kandra Nicolas, MD  levocetirizine (XYZAL) 5 MG tablet Take 1 tablet (5 mg total) by mouth every evening. 10/03/21   Samuel Bouche, NP  metroNIDAZOLE (FLAGYL) 500 MG tablet Take 1 tablet (500 mg total) by mouth  2 (two) times daily. 08/07/22   Chase Picket, MD  montelukast (SINGULAIR) 10 MG tablet TAKE 1 TABLET BY MOUTH AT BEDTIME 05/10/22   Samuel Bouche, NP  pantoprazole (PROTONIX) 20 MG tablet Take 1 tablet (20 mg total) by mouth daily. 05/31/22   Samuel Bouche, NP  Probiotic Product (PROBIOTIC PO) Take by mouth daily.    [provider]  promethazine-dextromethorphan (PROMETHAZINE-DM) 6.25-15 MG/5ML syrup Take 5 mLs by mouth 4 (four) times daily as needed. Patient not taking: Reported on 08/03/2022 03/23/22   Mar Daring, PA-C  scopolamine (TRANSDERM-SCOP) 1 MG/3DAYS Place 1 patch (1.5 mg total) onto the skin every 3 (three) days. Patient not taking: Reported on 08/03/2022 02/08/22   Samuel Bouche, NP  valsartan-hydrochlorothiazide (DIOVAN-HCT) 320-25 MG tablet Take 1 tablet by mouth daily. 02/08/22   Samuel Bouche, NP      Allergies    Aldactone [spironolactone], Benazepril, Betadine [povidone iodine], Calcitonin (salmon), Other, Vicodin [hydrocodone-acetaminophen], and Shellfish allergy    Review of Systems   Review of Systems  Physical Exam Updated Vital Signs BP (!) 150/80 (BP Location: Right Arm)   Pulse 80   Temp 98.1 F (36.7 C) (Oral)   Resp 16   LMP 04/17/2014 Comment: hysterectomy w/ single oophorectomy//a.c.  SpO2 100%  Physical Exam Vitals  and nursing note reviewed.  Constitutional:      General: She is not in acute distress.    Appearance: She is well-developed. She is not diaphoretic.  HENT:     Head: Normocephalic and atraumatic.  Eyes:     Pupils: Pupils are equal, round, and reactive to light.  Cardiovascular:     Rate and Rhythm: Normal rate and regular rhythm.     Heart sounds: No murmur heard.    No friction rub. No gallop.  Pulmonary:     Effort: Pulmonary effort is normal.     Breath sounds: No wheezing or rales.  Abdominal:     General: There is no distension.     Palpations: Abdomen is soft.     Tenderness: There is no abdominal tenderness.   Musculoskeletal:        General: No tenderness.     Cervical back: Normal range of motion and neck supple.     Comments: Abrasion to the right knee.  No obvious bony tenderness over the kneecap.  Able to range the knee without significant tenderness.  Pulse motor and sensation intact distally.  Skin:    General: Skin is warm and dry.  Neurological:     Mental Status: She is alert and oriented to person, place, and time.  Psychiatric:        Behavior: Behavior normal.     ED Results / Procedures / Treatments   Labs (all labs ordered are listed, but only abnormal results are displayed) Labs Reviewed - No data to display  EKG None  Radiology DG Knee Complete 4 Views Right  Result Date: 08/16/2022 CLINICAL DATA:  Status post fall. EXAM: RIGHT KNEE - COMPLETE 4+ VIEW COMPARISON:  None Available. FINDINGS: No evidence of fracture, dislocation, or joint effusion. No evidence of arthropathy or other focal bone abnormality. Mild anterior suprapatellar and infrapatellar soft tissue swelling is seen. IMPRESSION: Mild anterior soft tissue swelling without an acute osseous injury. Electronically Signed   By: Virgina Norfolk M.D.   On: 08/16/2022 20:28    Procedures Procedures    Medications Ordered in ED Medications  Tdap (BOOSTRIX) injection 0.5 mL (0.5 mLs Intramuscular Given 08/16/22 2225)    ED Course/ Medical Decision Making/ A&P                           Medical Decision Making Amount and/or Complexity of Data Reviewed Radiology: ordered.  Risk Prescription drug management.   46 yo F with a chief complaints of fall.  Nonsyncopal by history.  Complaining of pain to the right knee.  She has an abrasion to the knee clinically.  No obvious bony tenderness.  Plain film independently interpreted by me without fracture.  I reviewed the patient's immunization records last tetanus shot was just about 10 years ago.  Electing to reupdate today.  PCP follow-up.  12:00 AM:  I have  discussed the diagnosis/risks/treatment options with the patient and family.  Evaluation and diagnostic testing in the emergency department does not suggest an emergent condition requiring admission or immediate intervention beyond what has been performed at this time.  They will follow up with  PCP. We also discussed returning to the ED immediately if new or worsening sx occur. We discussed the sx which are most concerning (e.g., sudden worsening pain, fever, inability to tolerate by mouth, redness, drainage) that necessitate immediate return. Medications administered to the patient during their visit and any new prescriptions  provided to the patient are listed below.  Medications given during this visit Medications  Tdap (BOOSTRIX) injection 0.5 mL (0.5 mLs Intramuscular Given 08/16/22 2225)     The patient appears reasonably screen and/or stabilized for discharge and I doubt any other medical condition or other [facility] requiring further screening, evaluation, or treatment in the ED at this time prior to discharge.          Final Clinical Impression(s) / ED Diagnoses Final diagnoses:  Fall, initial encounter  Knee abrasion, right, initial encounter    Rx / DC Orders ED Discharge Orders     None         Deno Etienne, DO 08/17/22 0000

## 2022-08-17 ENCOUNTER — Telehealth: Payer: Self-pay | Admitting: General Practice

## 2022-08-17 NOTE — Telephone Encounter (Signed)
Transition Care Management Unsuccessful Follow-up Telephone Call  Date of discharge and from where:  08/16/22 from [facility]  Attempts:  1st Attempt  Reason for unsuccessful TCM follow-up call:  Left voice message

## 2022-08-22 NOTE — Telephone Encounter (Signed)
Transition Care Management Unsuccessful Follow-up Telephone Call  Date of discharge and from where:  08/16/22 from [facility]  Attempts:  2nd Attempt  Reason for unsuccessful TCM follow-up call:  Left voice message

## 2022-08-24 NOTE — Telephone Encounter (Signed)
Transition Care Management Unsuccessful Follow-up Telephone Call  Date of discharge and from where:  08/16/22 from [facility]  Attempts:  3rd Attempt  Reason for unsuccessful TCM follow-up call:  Left voice message

## 2022-08-30 NOTE — Progress Notes (Deleted)
46 y.o. [id number] Divorced [place] American female here for annual exam.    PCP: Samuel Bouche, NP    Patient's last menstrual period was 04/17/2014.           Sexually active: {yes no:314532}  The current method of family planning is status post hysterectomy.    Exercising: {yes no:314532}  {types:19826} Smoker:  no  Health Maintenance: Pap:   01-20-14 Neg:Neg HR HPV History of abnormal Pap:  Yes,   hx of dysplasia and cryotherapy to cervix in 2000 MMG:  11-15-21 Neg/BiRads1 Colonoscopy:  07-14-21 2 polyps removed;5 years BMD:   ***  Result  *** TDaP:  08-16-22 Gardasil:   no HIV: 2017 Neg Hep C: 2017 Neg Screening Labs:  Hb today: ***, Urine today: ***   reports that she has never smoked. She has never used smokeless tobacco. She reports that she does not currently use alcohol. She reports that she does not use drugs.  Past Medical History:  Diagnosis Date   Allergy    Angio-edema    Anxiety    Asthma    no symptoms in "years", no inhaler   Depression    Fibromyalgia 2021   GERD (gastroesophageal reflux disease)    Herpes simplex    Hiatal hernia    Hyperlipidemia due to dietary fat intake    Hyperlysinemia (HCC)    Hypertension    Hypokalemia    Sickle cell trait (HCC)    Vitamin D deficiency     Past Surgical History:  Procedure Laterality Date   13 HOUR [place] STUDY N/A 07/08/2019   Procedure: 24 HOUR [place] STUDY;  Surgeon: Thornton Park, MD;  Location: [facility];  Service: Gastroenterology;  Laterality: N/A;   ABDOMINAL HYSTERECTOMY N/A 05/04/2014   Procedure: HYSTERECTOMY ABDOMINAL with BILATERAL SALPINGECTOMY ;  Surgeon: Jamey Reas de Berton Lan, MD;  Location: [facility];  Service: Gynecology;  Laterality: N/A;   BREAST BIOPSY Right 12/2014   benign fibroadenoma   ESOPHAGEAL MANOMETRY N/A 07/08/2019   Procedure: ESOPHAGEAL MANOMETRY (EM);  Surgeon: Thornton Park, MD;  [facility]: [facility];  Service: Gastroenterology;  Laterality: N/A;   FOOT  SURGERY     LAPAROTOMY N/A 05/04/2014   Procedure: REMOVAL OF ABD WALL MASS ;  Surgeon: Adin Hector, MD;  Location: [facility];  Service: General;  Laterality: N/A;   LIPOMA EXCISION N/A 05/04/2014   Procedure: EXCISION LIPOMA of abdominal wall;  Surgeon: Jamey Reas de Berton Lan, MD;  Location: [facility];  Service: Gynecology;  Laterality: N/A;   NASAL SINUS SURGERY  2019   PELVIC LAPAROSCOPY Right 2018   oophorectomy   TUBAL LIGATION     UPPER GASTROINTESTINAL ENDOSCOPY      Current Outpatient Medications  Medication Sig Dispense Refill   amoxicillin-clavulanate (AUGMENTIN) 875-125 MG tablet Take 1 tablet by mouth 2 (two) times daily. Take with food. 14 tablet 0   cetirizine (ZYRTEC) 10 MG tablet Take 1 tablet by mouth once daily 90 tablet 1   cholecalciferol (VITAMIN D3) 25 MCG (1000 UNIT) tablet Take 1,000 Units by mouth daily.     cloNIDine (CATAPRES) 0.1 MG tablet TAKE 1 TABLET BY MOUTH TWICE DAILY (NEED  HTN  CHECK  AT  OFFICE) 180 tablet 1   docusate sodium (COLACE) 100 MG capsule Take 100 mg by mouth daily as needed for mild constipation.     DULoxetine (CYMBALTA) 30 MG capsule Take 1 capsule by mouth once daily 90 capsule 1  EPINEPHrine (EPIPEN 2-PAK) 0.3 mg/0.3 mL IJ SOAJ injection Inject 0.3 mg into the muscle once as needed (for an anaphylactic reaction). 2 each 0   fluconazole (DIFLUCAN) 150 MG tablet Take one tab PO once.  May repeat in 72 hours. 2 tablet 0   hydrocortisone (ANUSOL-HC) 25 MG suppository Place one suppository PR Q12hr 24 suppository 0   levocetirizine (XYZAL) 5 MG tablet Take 1 tablet (5 mg total) by mouth every evening. 90 tablet 1   metroNIDAZOLE (FLAGYL) 500 MG tablet Take 1 tablet (500 mg total) by mouth 2 (two) times daily. 14 tablet 0   montelukast (SINGULAIR) 10 MG tablet TAKE 1 TABLET BY MOUTH AT BEDTIME 90 tablet 1   pantoprazole (PROTONIX) 20 MG tablet Take 1 tablet (20 mg total) by mouth daily. 90 tablet 1   Probiotic Product (PROBIOTIC  PO) Take by mouth daily.     promethazine-dextromethorphan (PROMETHAZINE-DM) 6.25-15 MG/5ML syrup Take 5 mLs by mouth 4 (four) times daily as needed. (Patient not taking: Reported on 08/03/2022) 118 mL 0   scopolamine (TRANSDERM-SCOP) 1 MG/3DAYS Place 1 patch (1.5 mg total) onto the skin every 3 (three) days. (Patient not taking: Reported on 08/03/2022) 10 patch 1   valsartan-hydrochlorothiazide (DIOVAN-HCT) 320-25 MG tablet Take 1 tablet by mouth daily. 90 tablet 3   Current Facility-Administered Medications  Medication Dose Route Frequency Provider Last Rate Last Admin   0.9 %  sodium chloride infusion  500 mL Intravenous Once Thornton Park, MD        Family History  Problem Relation Age of Onset   Diabetes Mother    Thyroid disease Mother    Colon polyps Mother    Stroke Father    Mental retardation Maternal Aunt    Diabetes Maternal Grandmother    Colon cancer Neg Hx    Esophageal cancer Neg Hx    Stomach cancer Neg Hx    Rectal cancer Neg Hx     Review of Systems  Exam:   LMP 04/17/2014 Comment: hysterectomy w/ single oophorectomy//a.c.    General appearance: alert, cooperative and appears stated age Head: normocephalic, without obvious abnormality, atraumatic Neck: no adenopathy, supple, symmetrical, trachea midline and thyroid normal to inspection and palpation Lungs: clear to auscultation bilaterally Breasts: normal appearance, no masses or tenderness, No nipple retraction or dimpling, No nipple discharge or bleeding, No axillary adenopathy Heart: regular rate and rhythm Abdomen: soft, non-tender; no masses, no organomegaly Extremities: extremities normal, atraumatic, no cyanosis or edema Skin: skin color, texture, turgor normal. No rashes or lesions Lymph nodes: cervical, supraclavicular, and axillary nodes normal. Neurologic: grossly normal  Pelvic: External genitalia:  no lesions              No abnormal inguinal nodes palpated.              Urethra:  normal  appearing urethra with no masses, tenderness or lesions              Bartholins and Skenes: normal                 Vagina: normal appearing vagina with normal color and discharge, no lesions              Cervix: no lesions              Pap taken: {yes no:314532} Bimanual Exam:  Uterus:  normal size, contour, position, consistency, mobility, non-tender              Adnexa: no  mass, fullness, tenderness              Rectal exam: {yes no:314532}.  Confirms.              Anus:  normal sphincter tone, no lesions  Chaperone was present for exam:  ***  Assessment:   Well woman visit with gynecologic exam.   Plan: Mammogram screening discussed. Self breast awareness reviewed. Pap and HR HPV as above. Guidelines for Calcium, Vitamin D, regular exercise program including cardiovascular and weight bearing exercise.   Follow up annually and prn.   Additional counseling given.  {yes [id number]. _______ minutes face to face time of which over 50% was spent in counseling.    After visit summary provided.

## 2022-09-03 ENCOUNTER — Ambulatory Visit: Payer: 59 | Admitting: Obstetrics and Gynecology

## 2022-09-13 NOTE — Progress Notes (Signed)
46 y.o. [id number] Divorced [place] American female here for annual exam.    Hot flashes.  Declines treatment.   No partner change.  Declines STD screening.   Hx HSV.  No outbreaks.  Took Valtrex in the past.  Declines Rx today.  Some vaginal itching.   Will do allergy testing in February due to chronic sinusitis symptoms.  PCP:  Samuel Bouche   Patient's last menstrual period was 04/17/2014.           Sexually active: Yes.    The current method of family planning is status post hysterectomy.    Exercising: Yes.     walking Smoker:  no  Health Maintenance: Pap:  01-21-14 neg HPV HR neg History of abnormal Pap:  hx of dysplasia & cryotherapy to cervix 2000 MMG:  11-15-21 category c density birads 1:neg Colonoscopy:  7/22 polyp, f/u 25yr BMD:   n/a  Result  n/a TDaP:  2023 Gardasil:   no HIV: neg 2022 Hep C: neg 2022 Screening Labs: PCP Declines flu vaccine and Covid booster.  Shingrix:  not completed.   reports that she has never smoked. She has never used smokeless tobacco. She reports that she does not currently use alcohol. She reports that she does not use drugs.  Past Medical History:  Diagnosis Date   Allergy    Angio-edema    Anxiety    Asthma    no symptoms in "years", no inhaler   Depression    Fibromyalgia 2021   GERD (gastroesophageal reflux disease)    Herpes simplex    Hiatal hernia    Hyperlipidemia due to dietary fat intake    Hyperlysinemia (HCC)    Hypertension    Hypokalemia    Sickle cell trait (HCC)    Vitamin D deficiency     Past Surgical History:  Procedure [place]ality Date   [place] 07[place]08/2019   Procedure: [place];  Surgeon: BThornton Park MD;  [facility];  Service: Gastroenterology;  Laterality: N/A;   ABDOMINAL HYSTERECTOMY N/A 05/04/2014   Procedure: HYSTERECTOMY ABDOMINAL with BILATERAL SALPINGECTOMY ;  Surgeon: BJamey Reasde CBerton Lan MD;  [facility];  Service: Gynecology;   Laterality: N/A;   BREAST BIOPSY Right 12/2014   benign fibroadenoma   ESOPHAGEAL MANOMETRY N/A 07/08/2019   Procedure: ESOPHAGEAL MANOMETRY (EM);  Surgeon: BThornton Park MD;  [facility];  Service: Gastroenterology;  Laterality: N/A;   FOOT SURGERY     LAPAROTOMY N/A 05/04/2014   Procedure: REMOVAL OF ABD WALL MASS ;  Surgeon: SAdin Hector MD;  [facility];  Service: General;  Laterality: N/A;   LIPOMA EXCISION N/A 05/04/2014   Procedure: EXCISION LIPOMA of abdominal wall;  Surgeon: BJamey Reasde CBerton Lan MD;  [facility];  Service: Gynecology;  Laterality: N/A;   NASAL SINUS SURGERY  2019   PELVIC LAPAROSCOPY Right 2018   oophorectomy   TUBAL LIGATION     UPPER GASTROINTESTINAL ENDOSCOPY      Current Outpatient Medications  Medication Sig Dispense Refill   cetirizine (ZYRTEC) 10 MG tablet Take 1 tablet by mouth once daily 90 tablet 1   cholecalciferol (VITAMIN D3) 25 MCG (1000 UNIT) tablet Take 1,000 Units by mouth daily.     cloNIDine (CATAPRES) 0.1 MG tablet TAKE 1 TABLET BY MOUTH TWICE DAILY (NEED  HTN  CHECK  AT  OFFICE) 180 tablet 1   DULoxetine (CYMBALTA) 30 MG capsule  Take 1 capsule by mouth once daily 90 capsule 1   EPINEPHrine (EPIPEN 2-PAK) 0.3 mg/0.3 mL IJ SOAJ injection Inject 0.3 mg into the muscle once as needed (for an anaphylactic reaction). 2 each 0   fluticasone (FLONASE) 50 MCG/ACT nasal spray Place into both nostrils.     levocetirizine (XYZAL) 5 MG tablet Take 1 tablet (5 mg total) by mouth every evening. 90 tablet 1   montelukast (SINGULAIR) 10 MG tablet TAKE 1 TABLET BY MOUTH AT BEDTIME 90 tablet 1   pantoprazole (PROTONIX) 20 MG tablet Take 1 tablet (20 mg total) by mouth daily. 90 tablet 1   Probiotic Product (PROBIOTIC PO) Take by mouth daily.     valsartan-hydrochlorothiazide (DIOVAN-HCT) 320-25 MG tablet Take 1 tablet by mouth daily. 90 tablet 3   No current facility-administered medications for this visit.     Family History  Problem Relation Age of Onset   Diabetes Mother    Thyroid disease Mother    Colon polyps Mother    Stroke Father    Mental retardation Maternal Aunt    Diabetes Maternal Grandmother    Colon cancer Neg Hx    Esophageal cancer Neg Hx    Stomach cancer Neg Hx    Rectal cancer Neg Hx     Review of Systems  Constitutional: Negative.   HENT: Negative.    Eyes: Negative.   Respiratory: Negative.    Cardiovascular: Negative.   Gastrointestinal: Negative.   Endocrine: Negative.   Genitourinary: Negative.   Musculoskeletal: Negative.   Skin: Negative.   Allergic/Immunologic: Negative.   Neurological: Negative.   Hematological: Negative.   Psychiatric/Behavioral: Negative.      Exam:   BP 124/68   Pulse 98   Ht 5' 5.75" (1.67 m) Comment: estimated due to hairstyle  Wt 203 lb (92.1 kg)   LMP 04/17/2014 Comment: hysterectomy w/ single oophorectomy//a.c.  SpO2 97%   BMI 33.01 kg/m     General appearance: alert, cooperative and appears stated age Head: normocephalic, without obvious abnormality, atraumatic Neck: no adenopathy, supple, symmetrical, trachea midline and thyroid normal to inspection and palpation Lungs: clear to auscultation bilaterally Breasts: normal appearance, no masses or tenderness, No nipple retraction or dimpling, No nipple discharge or bleeding, No axillary adenopathy Heart: regular rate and rhythm Abdomen: soft, non-tender; no masses, no organomegaly Extremities: extremities normal, atraumatic, no cyanosis or edema Skin: skin color, texture, turgor normal. No rashes or lesions Lymph nodes: cervical, supraclavicular, and axillary nodes normal. Neurologic: grossly normal  Pelvic: External genitalia:  no lesions              No abnormal inguinal nodes palpated.              Urethra:  normal appearing urethra with no masses, tenderness or lesions              Bartholins and Skenes: normal                 Vagina: normal appearing  vagina with normal color and clumpy white discharge, no lesions              Cervix: absent              Pap taken: no Bimanual Exam:  Uterus:  absent              Adnexa: no mass, fullness, tenderness              Rectal exam: yes.  Confirms.  Anus:  normal sphincter tone, no lesions  Chaperone was present for exam:  Joy, CMA  Assessment:   Well woman visit with gynecologic exam. Status post TAH/bilateral salpingectomy/excision of abdominal wall lipoma. Status post laparoscopic right oophorectomy/excision of epiploic mass. Left ovary remains.  Hx HSV.  Genital.  Fibromyalgia.  On Cymbalta. Vaginal discharge.   Plan: Mammogram screening discussed. Self breast awareness reviewed. Pap and HR HPV as above. Guidelines for Calcium, Vitamin D, regular exercise program including cardiovascular and weight bearing exercise. Wet prep.  Will contact with results.  She accepts Diflucan if needed. Follow up annually and prn.  After visit summary provided.   Addendum:  wet prep showed yeast, no clue cells, no trichomonas.

## 2022-09-19 ENCOUNTER — Encounter: Payer: Self-pay | Admitting: Obstetrics and Gynecology

## 2022-09-19 ENCOUNTER — Ambulatory Visit (INDEPENDENT_AMBULATORY_CARE_PROVIDER_SITE_OTHER): Payer: 59 | Admitting: Obstetrics and Gynecology

## 2022-09-19 VITALS — BP 124/68 | HR 98 | Ht 65.75 in | Wt 203.0 lb

## 2022-09-19 DIAGNOSIS — Z01419 Encounter for gynecological examination (general) (routine) without abnormal findings: Secondary | ICD-10-CM

## 2022-09-19 DIAGNOSIS — N898 Other specified noninflammatory disorders of vagina: Secondary | ICD-10-CM

## 2022-09-19 LAB — WET PREP FOR TRICH, YEAST, CLUE

## 2022-09-19 NOTE — Patient Instructions (Signed)

## 2022-09-21 MED ORDER — FLUCONAZOLE 150 MG PO TABS: 150.0000 mg | ORAL_TABLET | Freq: Once | ORAL | 0 refills | Status: AC

## 2022-11-11 ENCOUNTER — Other Ambulatory Visit: Payer: Self-pay | Admitting: Medical-Surgical

## 2022-11-11 DIAGNOSIS — J4520 Mild intermittent asthma, uncomplicated: Secondary | ICD-10-CM

## 2022-11-26 ENCOUNTER — Encounter: Payer: Self-pay | Admitting: Medical-Surgical

## 2022-11-26 NOTE — Progress Notes (Unsigned)
   Established Patient Office Visit  Subjective   Patient ID: Mary Robles, female   DOB: January 30, 1976 Age: 46 y.o. MRN: [id number]   No chief complaint on file.   HPI Pleasant 46 year old female presenting today for the following:  Hypertension:  Mood/fibromyalgia:  GERD:  Vitamin D deficiency:   Objective:    There were no vitals filed for this visit.  Physical Exam   No results found for this or any previous visit (from the past 24 hour(s)).   {Labs (Optional):23779}  The 10-year ASCVD risk score (Arnett DK, et al., 2019) is: 3.8%   Values used to calculate the score:     Age: 66 years     Sex: Female     Is Non-Hispanic African American: Yes     Diabetic: No     Tobacco smoker: No     Systolic Blood Pressure: 916 mmHg     Is BP treated: Yes     HDL Cholesterol: 38 mg/dL     Total Cholesterol: 171 mg/dL   Assessment & Plan:   No problem-specific Assessment & Plan notes found for this encounter.   No follow-ups on file.  ___________________________________________ Clearnce Sorrel, DNP, APRN, FNP-BC Primary Care and [place]

## 2022-11-27 ENCOUNTER — Encounter: Payer: Self-pay | Admitting: Medical-Surgical

## 2022-11-27 ENCOUNTER — Ambulatory Visit: Payer: 59 | Admitting: Medical-Surgical

## 2022-11-27 VITALS — BP 132/85 | HR 74 | Resp 20 | Ht 65.75 in | Wt 208.7 lb

## 2022-11-27 DIAGNOSIS — I10 Essential (primary) hypertension: Secondary | ICD-10-CM

## 2022-11-27 DIAGNOSIS — E559 Vitamin D deficiency, unspecified: Secondary | ICD-10-CM

## 2022-11-27 DIAGNOSIS — N898 Other specified noninflammatory disorders of vagina: Secondary | ICD-10-CM | POA: Diagnosis not present

## 2022-11-27 DIAGNOSIS — M797 Fibromyalgia: Secondary | ICD-10-CM | POA: Diagnosis not present

## 2022-11-27 DIAGNOSIS — F322 Major depressive disorder, single episode, severe without psychotic features: Secondary | ICD-10-CM | POA: Diagnosis not present

## 2022-11-27 DIAGNOSIS — K219 Gastro-esophageal reflux disease without esophagitis: Secondary | ICD-10-CM

## 2022-11-27 DIAGNOSIS — F411 Generalized anxiety disorder: Secondary | ICD-10-CM

## 2022-11-27 LAB — WET PREP FOR TRICH, YEAST, CLUE
MICRO NUMBER:: 14302911
Specimen Quality: ADEQUATE

## 2022-11-27 LAB — POCT URINALYSIS DIP (CLINITEK)
Bilirubin, UA: NEGATIVE
Glucose, UA: NEGATIVE mg/dL
Ketones, POC UA: NEGATIVE mg/dL
Leukocytes, UA: NEGATIVE
Nitrite, UA: NEGATIVE
Spec Grav, UA: >=1.03 — AB (ref 1.010–1.025)
Urobilinogen, UA: 0.2 E.U./dL
pH, UA: 6 (ref 5.0–8.0)

## 2022-11-27 MED ORDER — DULOXETINE HCL 40 MG PO CPEP: 40.0000 mg | ORAL_CAPSULE | Freq: Every day | ORAL | 1 refills | Status: DC

## 2022-11-27 MED ORDER — VALSARTAN-HYDROCHLOROTHIAZIDE 320-25 MG PO TABS: 1.0000 | ORAL_TABLET | Freq: Every day | ORAL | 3 refills | Status: DC

## 2022-11-28 LAB — LIPID PANEL
Cholesterol: 177 mg/dL (ref <200)
HDL: 40 mg/dL — ABNORMAL LOW (ref >=50)
LDL Cholesterol (Calc): 114 mg/dL (calc) — ABNORMAL HIGH
Non-HDL Cholesterol (Calc): 137 mg/dL (calc) — ABNORMAL HIGH (ref <130)
Total CHOL/HDL Ratio: 4.4 (calc) (ref <5.0)
Triglycerides: 121 mg/dL (ref <150)

## 2022-11-28 LAB — URINE CULTURE
MICRO NUMBER:: 14304934
Result:: NO GROWTH
SPECIMEN QUALITY:: ADEQUATE

## 2022-11-28 LAB — CBC WITH DIFFERENTIAL/PLATELET
Absolute Monocytes: 672 cells/uL (ref 200–950)
Basophils Absolute: 57 cells/uL (ref 0–200)
Basophils Relative: 0.7 %
Eosinophils Absolute: 146 cells/uL (ref 15–500)
Eosinophils Relative: 1.8 %
HCT: 40.6 % (ref 35.0–45.0)
Hemoglobin: 13.5 g/dL (ref 11.7–15.5)
Lymphs Abs: 3426 cells/uL (ref 850–3900)
MCH: 27.3 pg (ref 27.0–33.0)
MCHC: 33.3 g/dL (ref 32.0–36.0)
MCV: 82 fL (ref 80.0–100.0)
MPV: 11 fL (ref 7.5–12.5)
Monocytes Relative: 8.3 %
Neutro Abs: 3799 cells/uL (ref 1500–7800)
Neutrophils Relative %: 46.9 %
Platelets: 344 10*3/uL (ref 140–400)
RBC: 4.95 10*6/uL (ref 3.80–5.10)
RDW: 15.6 % — ABNORMAL HIGH (ref 11.0–15.0)
Total Lymphocyte: 42.3 %
WBC: 8.1 10*3/uL (ref 3.8–10.8)

## 2022-11-28 LAB — COMPLETE METABOLIC PANEL WITH GFR
AG Ratio: 1.6 (calc) (ref 1.0–2.5)
ALT: 15 U/L (ref 6–29)
AST: 13 U/L (ref 10–35)
Albumin: 4 g/dL (ref 3.6–5.1)
Alkaline phosphatase (APISO): 39 U/L (ref 31–125)
BUN: 11 mg/dL (ref 7–25)
CO2: 28 mmol/L (ref 20–32)
Calcium: 9.1 mg/dL (ref 8.6–10.2)
Chloride: 103 mmol/L (ref 98–110)
Creat: 0.96 mg/dL (ref 0.50–0.99)
Globulin: 2.5 g/dL (calc) (ref 1.9–3.7)
Glucose, Bld: 99 mg/dL (ref 65–99)
Potassium: 4 mmol/L (ref 3.5–5.3)
Sodium: 140 mmol/L (ref 135–146)
Total Bilirubin: 0.6 mg/dL (ref 0.2–1.2)
Total Protein: 6.5 g/dL (ref 6.1–8.1)
eGFR: 73 mL/min/{1.73_m2} (ref >=60)

## 2022-11-28 LAB — VITAMIN D 25 HYDROXY (VIT D DEFICIENCY, FRACTURES): Vit D, 25-Hydroxy: 43 ng/mL (ref 30–100)

## 2022-12-14 ENCOUNTER — Telehealth: Payer: Self-pay

## 2022-12-14 NOTE — Telephone Encounter (Signed)
Initiated Prior authorization VNR:[id number] HCl '40MG'$  dr capsules Via: Covermymeds Case/Key:[id number] Status: Pending as of 12/14/22 Reason: Notified Pt via: Mychart

## 2022-12-20 ENCOUNTER — Other Ambulatory Visit: Payer: Self-pay | Admitting: Medical-Surgical

## 2022-12-20 DIAGNOSIS — Z1231 Encounter for screening mammogram for malignant neoplasm of breast: Secondary | ICD-10-CM

## 2022-12-23 ENCOUNTER — Other Ambulatory Visit: Payer: Self-pay | Admitting: Medical-Surgical

## 2022-12-26 ENCOUNTER — Ambulatory Visit (INDEPENDENT_AMBULATORY_CARE_PROVIDER_SITE_OTHER): Payer: 59

## 2022-12-26 DIAGNOSIS — Z1231 Encounter for screening mammogram for malignant neoplasm of breast: Secondary | ICD-10-CM | POA: Diagnosis not present

## 2022-12-28 ENCOUNTER — Other Ambulatory Visit: Payer: Self-pay | Admitting: Medical-Surgical

## 2022-12-28 DIAGNOSIS — R928 Other abnormal and inconclusive findings on diagnostic imaging of breast: Secondary | ICD-10-CM

## 2023-01-01 ENCOUNTER — Ambulatory Visit: Payer: 59 | Admitting: Medical-Surgical

## 2023-01-07 ENCOUNTER — Ambulatory Visit
Admission: RE | Admit: 2023-01-07 | Discharge: 2023-01-07 | Disposition: A | Discharge status: Home / Self Care | Payer: 59 | Source: Ambulatory Visit | Attending: Medical-Surgical | Admitting: Medical-Surgical

## 2023-01-07 DIAGNOSIS — R928 Other abnormal and inconclusive findings on diagnostic imaging of breast: Secondary | ICD-10-CM

## 2023-01-10 ENCOUNTER — Other Ambulatory Visit: Payer: Self-pay | Admitting: Medical-Surgical

## 2023-01-22 ENCOUNTER — Other Ambulatory Visit: Payer: Self-pay | Admitting: Medical-Surgical

## 2023-01-25 ENCOUNTER — Encounter: Payer: Self-pay | Admitting: Medical-Surgical

## 2023-01-25 MED ORDER — DULOXETINE HCL 30 MG PO CPEP: 30.0000 mg | ORAL_CAPSULE | Freq: Every day | ORAL | 3 refills | Status: DC

## 2023-02-10 ENCOUNTER — Other Ambulatory Visit: Payer: Self-pay | Admitting: Medical-Surgical

## 2023-02-10 DIAGNOSIS — J4520 Mild intermittent asthma, uncomplicated: Secondary | ICD-10-CM

## 2023-02-19 ENCOUNTER — Ambulatory Visit: Payer: 59 | Admitting: Sports Medicine

## 2023-02-19 ENCOUNTER — Ambulatory Visit (INDEPENDENT_AMBULATORY_CARE_PROVIDER_SITE_OTHER): Payer: 59

## 2023-02-19 DIAGNOSIS — M5412 Radiculopathy, cervical region: Secondary | ICD-10-CM

## 2023-02-19 MED ORDER — PREDNISONE 50 MG PO TABS: ORAL_TABLET | ORAL | 0 refills | Status: DC

## 2023-02-19 MED ORDER — MELOXICAM 15 MG PO TABS: ORAL_TABLET | ORAL | 3 refills | Status: DC

## 2023-02-19 NOTE — Progress Notes (Signed)
    Procedures performed today:    None.  Independent interpretation of notes and tests performed by another provider:   None.  Brief History, Exam, Impression, and Recommendations:    Radiculitis of right cervical region Pleasant 47 year old female, she had a few weeks of increasing pain right trapezius, periscapular with radiation down the right arm, axilla, to the fourth and fifth fingers. Exam is unrevealing. Suspect cervical radiculitis, C8 distribution. Adding x-rays, prednisone, meloxicam, formal PT, return to see me in 6 weeks, MR for interventional planning if no better.    ____________________________________________ Gwen Her. Dianah Field, M.D., ABFM., CAQSM., AME. Primary Care and Sports Medicine [facility]  Adjunct Professor of [employer]  Risk manager

## 2023-02-19 NOTE — Assessment & Plan Note (Signed)
Pleasant 47 year old female, she had a few weeks of increasing pain right trapezius, periscapular with radiation down the right arm, axilla, to the fourth and fifth fingers. Exam is unrevealing. Suspect cervical radiculitis, C8 distribution. Adding x-rays, prednisone, meloxicam, formal PT, return to see me in 6 weeks, MR for interventional planning if no better.

## 2023-02-22 ENCOUNTER — Ambulatory Visit: Payer: 59 | Admitting: Rehabilitative and Restorative Service Providers"

## 2023-02-27 ENCOUNTER — Encounter: Payer: Self-pay | Admitting: Physical Therapy

## 2023-02-27 ENCOUNTER — Ambulatory Visit: Payer: 59 | Attending: Sports Medicine | Admitting: Physical Therapy

## 2023-02-27 ENCOUNTER — Other Ambulatory Visit: Payer: Self-pay

## 2023-02-27 DIAGNOSIS — M6281 Muscle weakness (generalized): Secondary | ICD-10-CM | POA: Diagnosis not present

## 2023-02-27 DIAGNOSIS — M5412 Radiculopathy, cervical region: Secondary | ICD-10-CM | POA: Insufficient documentation

## 2023-02-27 DIAGNOSIS — R293 Abnormal posture: Secondary | ICD-10-CM

## 2023-02-27 NOTE — Therapy (Signed)
OUTPATIENT PHYSICAL THERAPY CERVICAL EVALUATION   Patient Name: Mary Robles MRN: [id number] DOB:[birth year], 47 y.o., female Today's Date: 02/27/2023  END OF SESSION:  PT End of Session - 02/27/23 1359     Visit Number 1    Number of Visits 12    Date for PT Re-Evaluation 04/10/23    PT Start Time 1100    PT Stop Time 1140    PT Time Calculation (min) 40 min    Activity Tolerance Patient tolerated treatment well    Behavior During Therapy [facility] for tasks assessed/performed             Past Medical History:  Diagnosis Date   Allergy    Angio-edema    Anxiety    Asthma    no symptoms in "years", no inhaler   Depression    Fibromyalgia 2021   GERD (gastroesophageal reflux disease)    Herpes simplex    Hiatal hernia    Hyperlipidemia due to dietary fat intake    Hyperlysinemia (HCC)    Hypertension    Hypokalemia    Lipoma of abdominal wall s/p excision 05/04/2014 05/19/2014   Sickle cell trait ([place])    Status post total abdominal hysterectomy 05/04/2014   Vitamin D deficiency    Past Surgical History:  Procedure Laterality Date   28 HOUR [place] STUDY N/A 07/08/2019   Procedure: 24 HOUR [place] STUDY;  Surgeon: Thornton Park, MD;  Location: [facility];  Service: Gastroenterology;  Laterality: N/A;   ABDOMINAL HYSTERECTOMY N/A 05/04/2014   Procedure: HYSTERECTOMY ABDOMINAL with BILATERAL SALPINGECTOMY ;  Surgeon: Jamey Reas de Berton Lan, MD;  Location: [place] [facility];  Service: Gynecology;  Laterality: N/A;   BREAST BIOPSY Right 12/2014   benign fibroadenoma   ESOPHAGEAL MANOMETRY N/A 07/08/2019   Procedure: ESOPHAGEAL MANOMETRY (EM);  Surgeon: Thornton Park, MD;  Location: [facility];  Service: Gastroenterology;  Laterality: N/A;   FOOT SURGERY     LAPAROTOMY N/A 05/04/2014   Procedure: REMOVAL OF ABD WALL MASS ;  Surgeon: Adin Hector, MD;  Location: [facility];  Service: General;  Laterality: N/A;   LIPOMA EXCISION N/A 05/04/2014   Procedure: EXCISION  LIPOMA of abdominal wall;  Surgeon: Jamey Reas de Berton Lan, MD;  Location: [facility];  Service: Gynecology;  Laterality: N/A;   NASAL SINUS SURGERY  2019   PELVIC LAPAROSCOPY Right 2018   oophorectomy   TUBAL LIGATION     UPPER GASTROINTESTINAL ENDOSCOPY     Patient Active Problem List   Diagnosis Date Noted   Radiculitis of right cervical region 02/19/2023   Chronic sinusitis 05/25/2022   Plantar fasciitis, bilateral 04/05/2022   Fibromyalgia 02/16/2020   Cough    Gastroesophageal reflux disease    Heartburn    Chest pain with low risk for cardiac etiology 09/17/2018   DOE (dyspnea on exertion) 09/17/2018   Obesity (BMI 30.0-34.9) 07/02/2017   MDD (major depressive disorder) 07/16/2016   Asthma, mild 04/11/2015   GAD (generalized anxiety disorder) 02/08/2015   Hyperlipidemia 07/09/2014   Insomnia 07/09/2014   Hypertension    Vitamin D deficiency     PCP: Charna Archer  REFERRING PROVIDER: Thekkekandam  REFERRING DIAG: cervical radiculitis  THERAPY DIAG:  Radiculopathy, cervical region  Muscle weakness (generalized)  Abnormal posture  Rationale for Evaluation and Treatment: Rehabilitation  ONSET DATE: 02/06/23  SUBJECTIVE:  SUBJECTIVE STATEMENT: Pt fell asleep in a funny position on a long car ride and since then has had Rt shoulder and neck pain, tingling into Rt fingers on ulnar side. She states the pain is there "all the time", tingling is intermittent. Pain decreases with use of heat  PERTINENT HISTORY:  None reported  PAIN:  Are you having pain? Yes: NPRS scale: 6/10 Pain location: Rt UE Pain description: numb, hurts Aggravating factors: constant pain Relieving factors: heat  PRECAUTIONS: None  WEIGHT BEARING RESTRICTIONS: No  FALLS:  Has patient  fallen in last 6 months? 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  PLOF: Independent  PATIENT GOALS: decrease pain  NEXT MD VISIT: April 2024  OBJECTIVE:   DIAGNOSTIC FINDINGS:  X Ray: normal cerrvical spine  PATIENT SURVEYS:  FOTO 59   SENSATION: Intermittent "tingling" Rt 4th and 5th fingers  POSTURE: rounded shoulders and forward head  PALPATION: TTP and increased mm spasticity Rt paraspinals C3-T4, Rt upper trap Cervical jt mobility WFL Increased pain with manual cervical distraction   CERVICAL ROM:   Active ROM A/PROM (deg) eval  Flexion 40  Extension 50  Right lateral flexion 48  Left lateral flexion 25  Right rotation 55  Left rotation 55   (Blank rows = not tested)   UPPER EXTREMITY MMT:  MMT Right eval Left eval  Shoulder flexion 3 pain 4+  Shoulder extension    Shoulder abduction 4 4+  Shoulder adduction    Shoulder extension    Shoulder internal rotation    Shoulder external rotation    Middle trapezius    Lower trapezius    Elbow flexion    Elbow extension    Wrist flexion    Wrist extension    Wrist ulnar deviation    Wrist radial deviation    Wrist pronation    Wrist supination    Grip strength     (Blank rows = not tested)   TODAY'S TREATMENT:                                                                                                                              [facility] Adult PT Treatment:                                                DATE: 02/27/23 Therapeutic Exercise: See HEP Manual Therapy: STM thoracic and cervical paraspinals Trigger Point Dry-Needling  Treatment instructions: Expect mild to moderate muscle soreness. S/S of pneumothorax if dry needled over a lung field, and to seek immediate medical attention should they occur. Patient verbalized understanding of these instructions and education.  Patient Consent Given: Yes Education handout provided: Yes Muscles treated: Rt cervical and thoracic  paraspinals Electrical stimulation performed: No Parameters: N/A Treatment response/outcome: palpable increase in muscle length   PATIENT EDUCATION:  Education details: PT POC and goals, HEP, dry needling Person educated: Patient Education method: Explanation, Demonstration, and Handouts Education comprehension: verbalized understanding and returned demonstration  HOME EXERCISE PROGRAM: Access Code: [email or web address] URL: [email or web address] Date: 02/27/2023 Prepared by: Isabelle Course  Exercises - Seated Cervical Retraction  - 1 x daily - 7 x weekly - 2 sets - 10 reps - 2-3 seconds hold - Sidelying Open Book Thoracic Rotation with Knee on Foam Roll  - 1 x daily - 7 x weekly - 1 sets - 10 reps - 10 seconds hold - Doorway Pec Stretch at 90 Degrees Abduction  - 1 x daily - 7 x weekly - 1 sets - 10 reps - 10 seconds hold - Ulnar Nerve Flossing  - 1 x daily - 7 x weekly - 2 sets - 10 reps  ASSESSMENT:  CLINICAL IMPRESSION: Patient is a 47 y.o. female who was seen today for physical therapy evaluation and treatment for cervical radiculitis. Pt presents with increased pain, decreased strength, impaired posture, decreased activity tolerance and functional mobility. Pt will benefit from skilled PT to address deficits and improve pain free functional mobility.   OBJECTIVE IMPAIRMENTS: decreased activity tolerance, decreased ROM, decreased strength, and pain.   ACTIVITY LIMITATIONS: sleeping  PARTICIPATION LIMITATIONS: community activity and yard work  PERSONAL FACTORS: Past/current experiences and Time since onset of injury/illness/exacerbation are also affecting patient's functional outcome.   REHAB POTENTIAL: Good  CLINICAL DECISION MAKING: Evolving/moderate complexity  EVALUATION COMPLEXITY: Moderate   GOALS: Goals reviewed with patient? Yes  SHORT TERM GOALS: Target date: 03/13/2023    Pt will be independent with initial HEP Baseline:  Goal status:  INITIAL   LONG TERM GOALS: Target date: 04/10/2023    Pt will be independent with advanced HEP Baseline:  Goal status: INITIAL  2.  Pt will improve UE strength to 4+/5 to improve tolerance to IADLS Baseline:  Goal status: INITIAL  3.  Pt will improve FOTO to >= 71 to demo improved functional mobility Baseline:  Goal status: INITIAL  4.  Pt will report pain in Rt shoulder/neck to <= 3/10 on 5/7 days Baseline:  Goal status: INITIAL  5.  Pt will report "tingling" in Rt fingers <= 1/7 days Baseline:  Goal status: INITIAL   PLAN:  PT FREQUENCY: 1-2x/week  PT DURATION: 6 weeks  PLANNED INTERVENTIONS: Therapeutic exercises, Therapeutic activity, Neuromuscular re-education, Balance training, Gait training, Patient/Family education, Self Care, Joint mobilization, Aquatic Therapy, Dry Needling, Electrical stimulation, Cryotherapy, Moist heat, Taping, Traction, Ultrasound, Manual therapy, and Re-evaluation  PLAN FOR NEXT SESSION: how was needling? Postural strength, thoracic mobility, cervical stretching   Jaasiel [place], PT 02/27/2023, 2:00 PM

## 2023-03-04 ENCOUNTER — Ambulatory Visit: Payer: 59 | Admitting: Physical Therapy

## 2023-03-04 ENCOUNTER — Encounter: Payer: Self-pay | Admitting: Physical Therapy

## 2023-03-04 DIAGNOSIS — M5412 Radiculopathy, cervical region: Secondary | ICD-10-CM

## 2023-03-04 DIAGNOSIS — M6281 Muscle weakness (generalized): Secondary | ICD-10-CM

## 2023-03-04 DIAGNOSIS — R293 Abnormal posture: Secondary | ICD-10-CM

## 2023-03-04 NOTE — Therapy (Signed)
OUTPATIENT PHYSICAL THERAPY    Patient Name: Mary Robles MRN: [id number] DOB:[birth year], 47 y.o., female Today's Date: 03/04/2023  END OF SESSION:  PT End of Session - 03/04/23 1006     Visit Number 2    Number of Visits 12    Date for PT Re-Evaluation 04/10/23    PT Start Time 0928    PT Stop Time 1006    PT Time Calculation (min) 38 min    Activity Tolerance Patient tolerated treatment well    Behavior During Therapy [facility] for tasks assessed/performed              Past Medical History:  Diagnosis Date   Allergy    Angio-edema    Anxiety    Asthma    no symptoms in "years", no inhaler   Depression    Fibromyalgia 2021   GERD (gastroesophageal reflux disease)    Herpes simplex    Hiatal hernia    Hyperlipidemia due to dietary fat intake    Hyperlysinemia (HCC)    Hypertension    Hypokalemia    Lipoma of abdominal wall s/p excision 05/04/2014 05/19/2014   Sickle cell trait ([place])    Status post total abdominal hysterectomy 05/04/2014   Vitamin D deficiency    Past Surgical History:  Procedure Laterality Date   15 HOUR [place] STUDY N/A 07/08/2019   Procedure: 24 HOUR [place] STUDY;  Surgeon: Thornton Park, MD;  [facility];  Service: Gastroenterology;  Laterality: N/A;   ABDOMINAL HYSTERECTOMY N/A 05/04/2014   Procedure: HYSTERECTOMY ABDOMINAL with BILATERAL SALPINGECTOMY ;  Surgeon: Jamey Reas de Berton Lan, MD;  Location: [place] [facility];  Service: Gynecology;  Laterality: N/A;   BREAST BIOPSY Right 12/2014   benign fibroadenoma   ESOPHAGEAL MANOMETRY N/A 07/08/2019   Procedure: ESOPHAGEAL MANOMETRY (EM);  Surgeon: Thornton Park, MD;  [facility];  Service: Gastroenterology;  Laterality: N/A;   FOOT SURGERY     LAPAROTOMY N/A 05/04/2014   Procedure: REMOVAL OF ABD WALL MASS ;  Surgeon: Adin Hector, MD;  Location: [facility];  Service: General;  Laterality: N/A;   LIPOMA EXCISION N/A 05/04/2014   Procedure: EXCISION LIPOMA of  abdominal wall;  Surgeon: Jamey Reas de Berton Lan, MD;  Location: [facility];  Service: Gynecology;  Laterality: N/A;   NASAL SINUS SURGERY  2019   PELVIC LAPAROSCOPY Right 2018   oophorectomy   TUBAL LIGATION     UPPER GASTROINTESTINAL ENDOSCOPY     Patient Active Problem List   Diagnosis Date Noted   Radiculitis of right cervical region 02/19/2023   Chronic sinusitis 05/25/2022   Plantar fasciitis, bilateral 04/05/2022   Fibromyalgia 02/16/2020   Cough    Gastroesophageal reflux disease    Heartburn    Chest pain with low risk for cardiac etiology 09/17/2018   DOE (dyspnea on exertion) 09/17/2018   Obesity (BMI 30.0-34.9) 07/02/2017   MDD (major depressive disorder) 07/16/2016   Asthma, mild 04/11/2015   GAD (generalized anxiety disorder) 02/08/2015   Hyperlipidemia 07/09/2014   Insomnia 07/09/2014   Hypertension    Vitamin D deficiency     PCP: Charna Archer  REFERRING PROVIDER: Thekkekandam  REFERRING DIAG: cervical radiculitis  THERAPY DIAG:  Radiculopathy, cervical region  Muscle weakness (generalized)  Abnormal posture  Rationale for Evaluation and Treatment: Rehabilitation  ONSET DATE: 02/06/23  SUBJECTIVE:  SUBJECTIVE STATEMENT: Pt states she felt good for about 1 day after needling, then pain returned. She feels more today because she cooked and cleaned yesterday  PERTINENT HISTORY:  Pt fell asleep in a funny position on a long car ride and since then has had Rt shoulder and neck pain, tingling into Rt fingers on ulnar side. She states the pain is there "all the time", tingling is intermittent. Pain decreases with use of heat  PAIN:  Are you having pain? Yes: NPRS scale: 5/10 Pain location: Rt UE Pain description: numb, hurts Aggravating factors:  constant pain Relieving factors: heat  PRECAUTIONS: None  WEIGHT BEARING RESTRICTIONS: No  FALLS:  Has patient fallen in last 6 months? 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  PLOF: Independent  PATIENT GOALS: decrease pain  NEXT MD VISIT: April 2024  OBJECTIVE:    PATIENT SURVEYS:  FOTO 59   SENSATION: Intermittent "tingling" Rt 4th and 5th fingers  POSTURE: rounded shoulders and forward head  PALPATION: TTP and increased mm spasticity Rt paraspinals C3-T4, Rt upper trap Cervical jt mobility WFL Increased pain with manual cervical distraction   CERVICAL ROM:   Active ROM A/PROM (deg) eval  Flexion 40  Extension 50  Right lateral flexion 48  Left lateral flexion 25  Right rotation 55  Left rotation 55   (Blank rows = not tested)   UPPER EXTREMITY MMT:  MMT Right eval Left eval  Shoulder flexion 3 pain 4+  Shoulder extension    Shoulder abduction 4 4+  Shoulder adduction    Shoulder extension    Shoulder internal rotation    Shoulder external rotation    Middle trapezius    Lower trapezius    Elbow flexion    Elbow extension    Wrist flexion    Wrist extension    Wrist ulnar deviation    Wrist radial deviation    Wrist pronation    Wrist supination    Grip strength     (Blank rows = not tested)   TODAY'S TREATMENT:                                                                                                                              [facility] Adult PT Treatment:                                                DATE: 03/04/23 Therapeutic Exercise: UBE L3 x 4 min alt fwd/bkwd Doorway stretch 2 x 30 sec T, I green TB 2 x 10 Bow and arrow green TB 2 x 10 Lower trap setting at wall x 10 Open book x 10 Ulnar nerve flossing x 10 Thread the needle at wall x 10 Cervical retraction 10 x 3 sec Bilat ER 10 x 3 sec hold Manual Therapy: STM cervical  and upper thoracic paraspinals Grade 2-3 CPAs T2-4    [facility] Adult PT Treatment:                                                 DATE: 02/27/23 Therapeutic Exercise: See HEP Manual Therapy: STM thoracic and cervical paraspinals Trigger Point Dry-Needling  Treatment instructions: Expect mild to moderate muscle soreness. S/S of pneumothorax if dry needled over a lung field, and to seek immediate medical attention should they occur. Patient verbalized understanding of these instructions and education.  Patient Consent Given: Yes Education handout provided: Yes Muscles treated: Rt cervical and thoracic paraspinals Electrical stimulation performed: No Parameters: N/A Treatment response/outcome: palpable increase in muscle length   PATIENT EDUCATION:  Education details: PT POC and goals, HEP, dry needling Person educated: Patient Education method: Explanation, Demonstration, and Handouts Education comprehension: verbalized understanding and returned demonstration  HOME EXERCISE PROGRAM: Access Code: [email or web address] URL: [email or web address] Date: 02/27/2023 Prepared by: Isabelle Course  Exercises - Seated Cervical Retraction  - 1 x daily - 7 x weekly - 2 sets - 10 reps - 2-3 seconds hold - Sidelying Open Book Thoracic Rotation with Knee on Foam Roll  - 1 x daily - 7 x weekly - 1 sets - 10 reps - 10 seconds hold - Doorway Pec Stretch at 90 Degrees Abduction  - 1 x daily - 7 x weekly - 1 sets - 10 reps - 10 seconds hold - Ulnar Nerve Flossing  - 1 x daily - 7 x weekly - 2 sets - 10 reps  ASSESSMENT:  CLINICAL IMPRESSION: Pt with good tolerance to addition of strengthening exercises. She responds well to manual therapy and has decreased pain and tingling after treatment  OBJECTIVE IMPAIRMENTS: decreased activity tolerance, decreased ROM, decreased strength, and pain.     GOALS: Goals reviewed with patient? Yes  SHORT TERM GOALS: Target date: 03/13/2023    Pt will be independent with initial HEP Baseline:  Goal status: INITIAL   LONG TERM GOALS: Target  date: 04/10/2023    Pt will be independent with advanced HEP Baseline:  Goal status: INITIAL  2.  Pt will improve UE strength to 4+/5 to improve tolerance to IADLS Baseline:  Goal status: INITIAL  3.  Pt will improve FOTO to >= 71 to demo improved functional mobility Baseline:  Goal status: INITIAL  4.  Pt will report pain in Rt shoulder/neck to <= 3/10 on 5/7 days Baseline:  Goal status: INITIAL  5.  Pt will report "tingling" in Rt fingers <= 1/7 days Baseline:  Goal status: INITIAL   PLAN:  PT FREQUENCY: 1-2x/week  PT DURATION: 6 weeks  PLANNED INTERVENTIONS: Therapeutic exercises, Therapeutic activity, Neuromuscular re-education, Balance training, Gait training, Patient/Family education, Self Care, Joint mobilization, Aquatic Therapy, Dry Needling, Electrical stimulation, Cryotherapy, Moist heat, Taping, Traction, Ultrasound, Manual therapy, and Re-evaluation  PLAN FOR NEXT SESSION: Add strength to HEP! Postural strength, thoracic mobility, cervical stretching   Maleia Weems, PT 03/04/2023, 10:06 AM

## 2023-03-05 ENCOUNTER — Encounter: Payer: Self-pay | Admitting: Medical-Surgical

## 2023-03-06 ENCOUNTER — Ambulatory Visit: Payer: 59

## 2023-03-06 DIAGNOSIS — R293 Abnormal posture: Secondary | ICD-10-CM

## 2023-03-06 DIAGNOSIS — M6281 Muscle weakness (generalized): Secondary | ICD-10-CM

## 2023-03-06 DIAGNOSIS — M5412 Radiculopathy, cervical region: Secondary | ICD-10-CM

## 2023-03-06 NOTE — Therapy (Signed)
OUTPATIENT PHYSICAL THERAPY    Patient Name: Mary Robles MRN: [id number] DOB:09/19/1976, 47 y.o., female Today's Date: 03/06/2023  END OF SESSION:  PT End of Session - 03/06/23 1100     Visit Number 3    Number of Visits 12    Date for PT Re-Evaluation 04/10/23    PT Start Time 1100    PT Stop Time 1143    PT Time Calculation (min) 43 min    Activity Tolerance Patient tolerated treatment well    Behavior During Therapy [facility] for tasks assessed/performed              Past Medical History:  Diagnosis Date   Allergy    Angio-edema    Anxiety    Asthma    no symptoms in "years", no inhaler   Depression    Fibromyalgia 2021   GERD (gastroesophageal reflux disease)    Herpes simplex    Hiatal hernia    Hyperlipidemia due to dietary fat intake    Hyperlysinemia (HCC)    Hypertension    Hypokalemia    Lipoma of abdominal wall s/p excision 05/04/2014 05/19/2014   Sickle cell trait ([place])    Status post total abdominal hysterectomy 05/04/2014   Vitamin D deficiency    Past Surgical History:  Procedure Laterality Date   65 HOUR [place] STUDY N/A 07/08/2019   Procedure: 24 HOUR [place] STUDY;  Surgeon: Thornton Park, MD;  Location: [facility];  Service: Gastroenterology;  Laterality: N/A;   ABDOMINAL HYSTERECTOMY N/A 05/04/2014   Procedure: HYSTERECTOMY ABDOMINAL with BILATERAL SALPINGECTOMY ;  Surgeon: Jamey Reas de Berton Lan, MD;  Location: [place] [facility];  Service: Gynecology;  Laterality: N/A;   BREAST BIOPSY Right 12/2014   benign fibroadenoma   ESOPHAGEAL MANOMETRY N/A 07/08/2019   Procedure: ESOPHAGEAL MANOMETRY (EM);  Surgeon: Thornton Park, MD;  Location: [facility];  Service: Gastroenterology;  Laterality: N/A;   FOOT SURGERY     LAPAROTOMY N/A 05/04/2014   Procedure: REMOVAL OF ABD WALL MASS ;  Surgeon: Adin Hector, MD;  Location: [place] [facility];  Service: General;  Laterality: N/A;   LIPOMA EXCISION N/A 05/04/2014   Procedure: EXCISION LIPOMA of  abdominal wall;  Surgeon: Jamey Reas de Berton Lan, MD;  Location: [place] [facility];  Service: Gynecology;  Laterality: N/A;   NASAL SINUS SURGERY  2019   PELVIC LAPAROSCOPY Right 2018   oophorectomy   TUBAL LIGATION     UPPER GASTROINTESTINAL ENDOSCOPY     Patient Active Problem List   Diagnosis Date Noted   Radiculitis of right cervical region 02/19/2023   Chronic sinusitis 05/25/2022   Plantar fasciitis, bilateral 04/05/2022   Fibromyalgia 02/16/2020   Cough    Gastroesophageal reflux disease    Heartburn    Chest pain with low risk for cardiac etiology 09/17/2018   DOE (dyspnea on exertion) 09/17/2018   Obesity (BMI 30.0-34.9) 07/02/2017   MDD (major depressive disorder) 07/16/2016   Asthma, mild 04/11/2015   GAD (generalized anxiety disorder) 02/08/2015   Hyperlipidemia 07/09/2014   Insomnia 07/09/2014   Hypertension    Vitamin D deficiency     PCP: Charna Archer  REFERRING PROVIDER: Thekkekandam  REFERRING DIAG: cervical radiculitis  THERAPY DIAG:  Radiculopathy, cervical region  Muscle weakness (generalized)  Abnormal posture  Rationale for Evaluation and Treatment: Rehabilitation  ONSET DATE: 02/06/23  SUBJECTIVE:  SUBJECTIVE STATEMENT: Patient reports 4/10 pain in posterior R shoulder today,states it radiates up into neck. Patient states she does not have any tingling in her fingers.  PERTINENT HISTORY:  Pt fell asleep in a funny position on a long car ride and since then has had Rt shoulder and neck pain, tingling into Rt fingers on ulnar side. She states the pain is there "all the time", tingling is intermittent. Pain decreases with use of heat  PAIN:  Are you having pain? Yes: NPRS scale: 5/10 Pain location: Rt UE Pain description: numb, hurts Aggravating  factors: constant pain Relieving factors: heat  PRECAUTIONS: None  WEIGHT BEARING RESTRICTIONS: No  FALLS:  Has patient fallen in last 6 months? 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  PLOF: Independent  PATIENT GOALS: decrease pain  NEXT MD VISIT: April 2024  OBJECTIVE:    PATIENT SURVEYS:  FOTO 59   SENSATION: Intermittent "tingling" Rt 4th and 5th fingers  POSTURE: rounded shoulders and forward head  PALPATION: TTP and increased mm spasticity Rt paraspinals C3-T4, Rt upper trap Cervical jt mobility WFL Increased pain with manual cervical distraction   CERVICAL ROM:   Active ROM A/PROM (deg) eval  Flexion 40  Extension 50  Right lateral flexion 48  Left lateral flexion 25  Right rotation 55  Left rotation 55   (Blank rows = not tested)   UPPER EXTREMITY MMT:  MMT Right eval Left eval  Shoulder flexion 3 pain 4+  Shoulder extension    Shoulder abduction 4 4+  Shoulder adduction    Shoulder extension    Shoulder internal rotation    Shoulder external rotation    Middle trapezius    Lower trapezius    Elbow flexion    Elbow extension    Wrist flexion    Wrist extension    Wrist ulnar deviation    Wrist radial deviation    Wrist pronation    Wrist supination    Grip strength     (Blank rows = not tested)   TODAY'S TREATMENT:     [facility] Adult PT Treatment:                                                DATE: 03/06/2023 Therapeutic Exercise: UBE L5 x 79min Fwd/45min Bkwd Seated active ulnar nerve glide --> supine passive ulnar nerve glide Supine on foam roller (head/pelvis on): angel arms, hug/pec stretch, swimming, arm circles Supine cactus arms +overhead arm slide --> standing variation against wall Shoulder ER isometric step out BTB x10 B Wall: Bent arm shoulder flexion with light green loop band SA wall slides with light green loop band SA "V" wall slides with foam roller Chest expansion walk-out Manual Therapy: STM cervical  paraspinals, UT/LS, cervical upglides STM/TPR posterior shoulder girdle  [facility] Adult PT Treatment:                                                DATE: 03/04/23 Therapeutic Exercise: UBE L3 x 4 min alt fwd/bkwd Doorway stretch 2 x 30 sec T, I green TB 2 x 10 Bow and arrow green TB 2 x 10 Lower trap setting at wall x 10 Open book x 10 Ulnar nerve flossing x 10 Thread the needle at wall x 10 Cervical retraction 10 x 3 sec Bilat ER 10 x 3 sec hold Manual Therapy: STM cervical and upper thoracic paraspinals Grade 2-3 CPAs T2-4    [facility] Adult PT Treatment:                                                DATE: 02/27/23 Therapeutic Exercise: See HEP Manual Therapy: STM thoracic and cervical paraspinals Trigger Point Dry-Needling  Treatment instructions: Expect mild to moderate muscle soreness. S/S of pneumothorax if dry needled over a lung field, and to seek immediate medical attention should they occur. Patient verbalized understanding of these instructions and education.  Patient Consent Given: Yes Education handout provided: Yes Muscles treated: Rt cervical and thoracic paraspinals Electrical stimulation performed: No Parameters: N/A Treatment response/outcome: palpable increase in muscle length   PATIENT EDUCATION:  Education details: PT POC and goals, HEP, dry needling Person educated: Patient Education method: Explanation, Demonstration, and Handouts Education comprehension: verbalized understanding and returned demonstration  HOME EXERCISE PROGRAM: Access Code: [email or web address] URL: [email or web address] Date: 02/27/2023 Prepared by: Isabelle Course  Exercises - Seated Cervical Retraction  - 1 x daily - 7 x weekly - 2 sets - 10 reps - 2-3 seconds hold - Sidelying Open Book Thoracic Rotation with Knee on Foam Roll  - 1 x daily - 7 x weekly -  1 sets - 10 reps - 10 seconds hold - Doorway Pec Stretch at 90 Degrees Abduction  - 1 x daily - 7 x weekly - 1 sets - 10 reps - 10 seconds hold - Ulnar Nerve Flossing  - 1 x daily - 7 x weekly - 2 sets - 10 reps  ASSESSMENT:  CLINICAL IMPRESSION: Initial increase in ulnar nerve symptoms during passive ulnar nerve glide,however symptoms decreased and pain-free ROM increased significantly. Postural strengthening continued with focus on posterior shoulder stability. Tactile cues provided to decrease upper trapezius compensation with overhead arm slides/raises.  OBJECTIVE IMPAIRMENTS: decreased activity tolerance, decreased ROM, decreased strength, and pain.     GOALS: Goals reviewed with patient? Yes  SHORT TERM GOALS: Target date: 03/13/2023  Pt will be independent with initial HEP Baseline:  Goal status: INITIAL   LONG TERM GOALS: Target date: 04/10/2023  Pt will be independent with advanced HEP Baseline:  Goal status: INITIAL  2.  Pt will improve UE strength to 4+/5 to improve tolerance to IADLS Baseline:  Goal status: INITIAL  3.  Pt will improve FOTO to >= 71 to demo improved functional mobility Baseline:  Goal status: INITIAL  4.  Pt will report pain in Rt shoulder/neck to <= 3/10 on 5/7 days Baseline:  Goal status: INITIAL  5.  Pt will report "tingling" in Rt fingers <= 1/7 days Baseline:  Goal status: INITIAL  PLAN:  PT FREQUENCY: 1-2x/week  PT DURATION: 6 weeks  PLANNED INTERVENTIONS: Therapeutic exercises, Therapeutic activity, Neuromuscular re-education, Balance training, Gait training, Patient/Family education, Self Care, Joint mobilization, Aquatic Therapy, Dry Needling, Electrical stimulation, Cryotherapy, Moist heat, Taping, Traction, Ultrasound, Manual therapy, and Re-evaluation  PLAN FOR NEXT SESSION: DN next visit. Add strength to HEP. Continue postural strength, thoracic mobility, cervical stretching   Hardin Negus, PTA 03/06/2023, 11:46  AM

## 2023-03-09 ENCOUNTER — Other Ambulatory Visit: Payer: Self-pay | Admitting: Medical-Surgical

## 2023-03-13 ENCOUNTER — Ambulatory Visit: Payer: 59

## 2023-03-13 DIAGNOSIS — M5412 Radiculopathy, cervical region: Secondary | ICD-10-CM

## 2023-03-13 DIAGNOSIS — M6281 Muscle weakness (generalized): Secondary | ICD-10-CM

## 2023-03-13 DIAGNOSIS — R293 Abnormal posture: Secondary | ICD-10-CM

## 2023-03-13 NOTE — Therapy (Addendum)
OUTPATIENT PHYSICAL THERAPY    Patient Name: Mary Robles MRN: [id number] DOB:03/23/1976, 47 y.o., female Today's Date: 03/13/2023  END OF SESSION:  PT End of Session - 03/13/23 1147     Visit Number 4    Number of Visits 12    Date for PT Re-Evaluation 04/10/23    PT Start Time 1105    PT Stop Time 1145    PT Time Calculation (min) 40 min    Activity Tolerance Patient tolerated treatment well    Behavior During Therapy [facility] for tasks assessed/performed              Past Medical History:  Diagnosis Date   Allergy    Angio-edema    Anxiety    Asthma    no symptoms in "years", no inhaler   Depression    Fibromyalgia 2021   GERD (gastroesophageal reflux disease)    Herpes simplex    Hiatal hernia    Hyperlipidemia due to dietary fat intake    Hyperlysinemia (HCC)    Hypertension    Hypokalemia    Lipoma of abdominal wall s/p excision 05/04/2014 05/19/2014   Sickle cell trait ([place])    Status post total abdominal hysterectomy 05/04/2014   Vitamin D deficiency    Past Surgical History:  Procedure Laterality Date   59 HOUR [place] STUDY N/A 07/08/2019   Procedure: 24 HOUR [place] STUDY;  Surgeon: Thornton Park, MD;  Location: [facility];  Service: Gastroenterology;  Laterality: N/A;   ABDOMINAL HYSTERECTOMY N/A 05/04/2014   Procedure: HYSTERECTOMY ABDOMINAL with BILATERAL SALPINGECTOMY ;  Surgeon: Jamey Reas de Berton Lan, MD;  Location: [place] [facility];  Service: Gynecology;  Laterality: N/A;   BREAST BIOPSY Right 12/2014   benign fibroadenoma   ESOPHAGEAL MANOMETRY N/A 07/08/2019   Procedure: ESOPHAGEAL MANOMETRY (EM);  Surgeon: Thornton Park, MD;  Location: [facility];  Service: Gastroenterology;  Laterality: N/A;   FOOT SURGERY     LAPAROTOMY N/A 05/04/2014   Procedure: REMOVAL OF ABD WALL MASS ;  Surgeon: Adin Hector, MD;  Location: [place] [facility];  Service: General;  Laterality: N/A;   LIPOMA EXCISION N/A 05/04/2014   Procedure: EXCISION LIPOMA of  abdominal wall;  Surgeon: Jamey Reas de Berton Lan, MD;  Location: [place] [facility];  Service: Gynecology;  Laterality: N/A;   NASAL SINUS SURGERY  2019   PELVIC LAPAROSCOPY Right 2018   oophorectomy   TUBAL LIGATION     UPPER GASTROINTESTINAL ENDOSCOPY     Patient Active Problem List   Diagnosis Date Noted   Radiculitis of right cervical region 02/19/2023   Chronic sinusitis 05/25/2022   Plantar fasciitis, bilateral 04/05/2022   Fibromyalgia 02/16/2020   Cough    Gastroesophageal reflux disease    Heartburn    Chest pain with low risk for cardiac etiology 09/17/2018   DOE (dyspnea on exertion) 09/17/2018   Obesity (BMI 30.0-34.9) 07/02/2017   MDD (major depressive disorder) 07/16/2016   Asthma, mild 04/11/2015   GAD (generalized anxiety disorder) 02/08/2015   Hyperlipidemia 07/09/2014   Insomnia 07/09/2014   Hypertension    Vitamin D deficiency     PCP: Charna Archer  REFERRING PROVIDER: Thekkekandam  REFERRING DIAG: cervical radiculitis  THERAPY DIAG:  Radiculopathy, cervical region  Muscle weakness (generalized)  Abnormal posture  Rationale for Evaluation and Treatment: Rehabilitation  ONSET DATE: 02/06/23  SUBJECTIVE:  SUBJECTIVE STATEMENT: Patient reports pain 4/10 pain with tingling down into her outer 2 fingers today. Patient states she has been very busy with work and has not free time to review exercises at home. Patient states she has been sleeping fine.   PERTINENT HISTORY:  Pt fell asleep in a funny position on a long car ride and since then has had Rt shoulder and neck pain, tingling into Rt fingers on ulnar side. She states the pain is there "all the time", tingling is intermittent. Pain decreases with use of heat  PAIN:  Are you having pain? Yes: NPRS scale:  5/10 Pain location: Rt UE Pain description: numb, hurts Aggravating factors: constant pain Relieving factors: heat  PRECAUTIONS: None  WEIGHT BEARING RESTRICTIONS: No  FALLS:  Has patient fallen in last 6 months? 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  PLOF: Independent  PATIENT GOALS: decrease pain  NEXT MD VISIT: April 2024  OBJECTIVE:    PATIENT SURVEYS:  FOTO 59   SENSATION: Intermittent "tingling" Rt 4th and 5th fingers  POSTURE: rounded shoulders and forward head  PALPATION: TTP and increased mm spasticity Rt paraspinals C3-T4, Rt upper trap Cervical jt mobility WFL Increased pain with manual cervical distraction   CERVICAL ROM:   Active ROM A/PROM (deg) eval  Flexion 40  Extension 50  Right lateral flexion 48  Left lateral flexion 25  Right rotation 55  Left rotation 55   (Blank rows = not tested)   UPPER EXTREMITY MMT:  MMT Right eval Left eval  Shoulder flexion 3 pain 4+  Shoulder extension    Shoulder abduction 4 4+  Shoulder adduction    Shoulder extension    Shoulder internal rotation    Shoulder external rotation    Middle trapezius    Lower trapezius    Elbow flexion    Elbow extension    Wrist flexion    Wrist extension    Wrist ulnar deviation    Wrist radial deviation    Wrist pronation    Wrist supination    Grip strength     (Blank rows = not tested)   TODAY'S TREATMENT:     [facility] Adult PT Treatment:                                                DATE: 03/13/2023 Therapeutic Exercise: UBE L5 83min fwd/2 min bkwd + subjective intake Active ulnar nerve glide x15 UT/LS stretches (R) 2x30" each Cervical rotation SNAGs 5x3" Supine over deflated coregeous ball: shoulder flexion -->cactus arms Prone high I/Y/T/low I 2#DB x10 each Prone shoulder extension 2#DB 2x10 Standing rows GTB x10 Standing shoulder extension GTB x10 S/L shoulder ER 2#DB 2x10 S/L shoulder abd + alt IR/ER 2#DB x10 Manual Therapy: Manual  Therapy:  Trigger Point Dry-Needling  Treatment instructions: Expect mild to moderate muscle soreness. S/S of pneumothorax if dry needled over a lung field, and to seek immediate medical attention should they occur. Patient verbalized understanding of these instructions and education.  Patient Consent Given: Yes Education handout provided: Yes Muscles treated: upper trap, thoracic paraspinals Electrical stimulation performed: No Parameters: N/A Treatment response/outcome: palpable increase in muscle length    [facility] Adult PT Treatment:  DATE: 03/06/2023 Therapeutic Exercise: UBE L5 x 104min Fwd/3min Bkwd Seated active ulnar nerve glide --> supine passive ulnar nerve glide Supine on foam roller (head/pelvis on): angel arms, hug/pec stretch, swimming, arm circles Supine cactus arms +overhead arm slide --> standing variation against wall Shoulder ER isometric step out BTB x10 B Wall: Bent arm shoulder flexion with light green loop band SA wall slides with light green loop band SA "V" wall slides with foam roller Chest expansion walk-out Manual Therapy: STM cervical paraspinals, UT/LS, cervical upglides STM/TPR posterior shoulder girdle                                                                                                                     PATIENT EDUCATION:  Education details: PT POC and goals, HEP, dry needling Person educated: Patient Education method: Explanation, Demonstration, and Handouts Education comprehension: verbalized understanding and returned demonstration  HOME EXERCISE PROGRAM: Access Code: [email or web address] URL: [email or web address] Date: 03/13/2023 Prepared by: Helane Gunther  Exercises - Seated Cervical Retraction  - 1 x daily - 7 x weekly - 2 sets - 10 reps - 2-3 seconds hold - Sidelying Open Book Thoracic Rotation with Knee on Foam Roll  - 1 x daily - 7 x weekly - 1 sets - 10 reps - 10 seconds  hold - Doorway Pec Stretch at 90 Degrees Abduction  - 1 x daily - 7 x weekly - 1 sets - 10 reps - 10 seconds hold - Ulnar Nerve Flossing  - 1 x daily - 7 x weekly - 2 sets - 10 reps - Upper Trapezius Stretch  - 1 x daily - 7 x weekly - 3 sets - 10 reps - Seated Levator Scapulae Stretch  - 1 x daily - 7 x weekly - 3 sets - 10 reps - Seated Scapular Retraction  - 1 x daily - 7 x weekly - 3 sets - 10 reps - Supine Scapular Retraction in External Rotation  - 1 x daily - 7 x weekly - 3 sets - 10 reps - Prone Single Arm Shoulder Y  - 1 x daily - 7 x weekly - 3 sets - 10 reps - Prone Shoulder Horizontal Abduction  - 1 x daily - 7 x weekly - 3 sets - 10 reps - Prone Shoulder Extension - Single Arm  - 1 x daily - 7 x weekly - 3 sets - 10 reps  ASSESSMENT:  CLINICAL IMPRESSION:  Postural strengthening progressed with prone and standing exercises; tactile cues provided to improve scapula retraction without upper trapezius compensation. Cervical AROM progressed with added rotational SNAG stretches. Patient able to complete all exercises with no exacerbation of symptoms.   OBJECTIVE IMPAIRMENTS: decreased activity tolerance, decreased ROM, decreased strength, and pain.     GOALS: Goals reviewed with patient? Yes  SHORT TERM GOALS: Target date: 03/13/2023  Pt will be independent with initial HEP Baseline:  Goal status: INITIAL   LONG TERM GOALS: Target date: 04/10/2023  Pt will be independent with advanced HEP  Baseline:  Goal status: INITIAL  2.  Pt will improve UE strength to 4+/5 to improve tolerance to IADLS Baseline:  Goal status: INITIAL  3.  Pt will improve FOTO to >= 71 to demo improved functional mobility Baseline:  Goal status: INITIAL  4.  Pt will report pain in Rt shoulder/neck to <= 3/10 on 5/7 days Baseline:  Goal status: INITIAL  5.  Pt will report "tingling" in Rt fingers <= 1/7 days Baseline:  Goal status: INITIAL   PLAN:  PT FREQUENCY: 1-2x/week  PT  DURATION: 6 weeks  PLANNED INTERVENTIONS: Therapeutic exercises, Therapeutic activity, Neuromuscular re-education, Balance training, Gait training, Patient/Family education, Self Care, Joint mobilization, Aquatic Therapy, Dry Needling, Electrical stimulation, Cryotherapy, Moist heat, Taping, Traction, Ultrasound, Manual therapy, and Re-evaluation  PLAN FOR NEXT SESSION: Continue postural strength, thoracic mobility, cervical stretching   Hardin Negus, PTA 03/13/2023, 12:08 PM  Isabelle Course, PT,DPT03/27/2412:44 PM

## 2023-03-18 ENCOUNTER — Ambulatory Visit: Payer: 59 | Attending: Sports Medicine

## 2023-03-18 DIAGNOSIS — R293 Abnormal posture: Secondary | ICD-10-CM | POA: Diagnosis present

## 2023-03-18 DIAGNOSIS — M5412 Radiculopathy, cervical region: Secondary | ICD-10-CM | POA: Insufficient documentation

## 2023-03-18 DIAGNOSIS — M6281 Muscle weakness (generalized): Secondary | ICD-10-CM | POA: Insufficient documentation

## 2023-03-18 NOTE — Therapy (Signed)
OUTPATIENT PHYSICAL THERAPY    Patient Name: Mary Robles MRN: [id number] DOB:09-19-1976, 47 y.o., female Today's Date: 03/18/2023  END OF SESSION:  PT End of Session - 03/18/23 1146     Visit Number 5    Number of Visits 12    Date for PT Re-Evaluation 04/10/23    PT Start Time [id number]    PT Stop Time 1225    PT Time Calculation (min) 40 min    Activity Tolerance Patient tolerated treatment well    Behavior During Therapy [facility] for tasks assessed/performed              Past Medical History:  Diagnosis Date   Allergy    Angio-edema    Anxiety    Asthma    no symptoms in "years", no inhaler   Depression    Fibromyalgia 2021   GERD (gastroesophageal reflux disease)    Herpes simplex    Hiatal hernia    Hyperlipidemia due to dietary fat intake    Hyperlysinemia    Hypertension    Hypokalemia    Lipoma of abdominal wall s/p excision 05/04/2014 05/19/2014   Sickle cell trait    Status post total abdominal hysterectomy 05/04/2014   Vitamin D deficiency    Past Surgical History:  Procedure Laterality Date   75 HOUR [place] STUDY N/A 07/08/2019   Procedure: 24 HOUR [place] STUDY;  Surgeon: Thornton Park, MD;  Location: [facility];  Service: Gastroenterology;  Laterality: N/A;   ABDOMINAL HYSTERECTOMY N/A 05/04/2014   Procedure: HYSTERECTOMY ABDOMINAL with BILATERAL SALPINGECTOMY ;  Surgeon: Jamey Reas de Berton Lan, MD;  Location: [facility];  Service: Gynecology;  Laterality: N/A;   BREAST BIOPSY Right 12/2014   benign fibroadenoma   ESOPHAGEAL MANOMETRY N/A 07/08/2019   Procedure: ESOPHAGEAL MANOMETRY (EM);  Surgeon: Thornton Park, MD;  Location: [facility];  Service: Gastroenterology;  Laterality: N/A;   FOOT SURGERY     LAPAROTOMY N/A 05/04/2014   Procedure: REMOVAL OF ABD WALL MASS ;  Surgeon: Adin Hector, MD;  Location: [facility];  Service: General;  Laterality: N/A;   LIPOMA EXCISION N/A 05/04/2014   Procedure: EXCISION LIPOMA of abdominal wall;   Surgeon: Jamey Reas de Berton Lan, MD;  Location: [facility];  Service: Gynecology;  Laterality: N/A;   NASAL SINUS SURGERY  2019   PELVIC LAPAROSCOPY Right 2018   oophorectomy   TUBAL LIGATION     UPPER GASTROINTESTINAL ENDOSCOPY     Patient Active Problem List   Diagnosis Date Noted   Radiculitis of right cervical region 02/19/2023   Chronic sinusitis 05/25/2022   Plantar fasciitis, bilateral 04/05/2022   Fibromyalgia 02/16/2020   Cough    Gastroesophageal reflux disease    Heartburn    Chest pain with low risk for cardiac etiology 09/17/2018   DOE (dyspnea on exertion) 09/17/2018   Obesity (BMI 30.0-34.9) 07/02/2017   MDD (major depressive disorder) 07/16/2016   Asthma, mild 04/11/2015   GAD (generalized anxiety disorder) 02/08/2015   Hyperlipidemia 07/09/2014   Insomnia 07/09/2014   Hypertension    Vitamin D deficiency     PCP: Charna Archer  REFERRING PROVIDER: Thekkekandam  REFERRING DIAG: cervical radiculitis  THERAPY DIAG:  Radiculopathy, cervical region  Muscle weakness (generalized)  Abnormal posture  Rationale for Evaluation and Treatment: Rehabilitation  ONSET DATE: 02/06/23  SUBJECTIVE:  SUBJECTIVE STATEMENT: Patient reports 2/10 pain level today, states she is having less tingling in fingers.   PERTINENT HISTORY:  Pt fell asleep in a funny position on a long car ride and since then has had Rt shoulder and neck pain, tingling into Rt fingers on ulnar side. She states the pain is there "all the time", tingling is intermittent. Pain decreases with use of heat  PAIN:  Are you having pain? Yes: NPRS scale: 5/10 Pain location: Rt UE Pain description: numb, hurts Aggravating factors: constant pain Relieving factors: heat  PRECAUTIONS: None  WEIGHT  BEARING RESTRICTIONS: No  FALLS:  Has patient fallen in last 6 months? 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  PLOF: Independent  PATIENT GOALS: decrease pain  NEXT MD VISIT: April 2024  OBJECTIVE:    PATIENT SURVEYS:  FOTO 59 03/18/23: 96   SENSATION: Intermittent "tingling" Rt 4th and 5th fingers  POSTURE: rounded shoulders and forward head  PALPATION: TTP and increased mm spasticity Rt paraspinals C3-T4, Rt upper trap Cervical jt mobility WFL Increased pain with manual cervical distraction   CERVICAL ROM:   Active ROM A/PROM (deg) eval  Flexion 40  Extension 50  Right lateral flexion 48  Left lateral flexion 25  Right rotation 55  Left rotation 55   (Blank rows = not tested)   UPPER EXTREMITY MMT:  MMT Right eval Left eval Right   Shoulder flexion 3 pain 4+ 4+ No pain  Shoulder extension     Shoulder abduction 4 4+ 4+  Shoulder adduction     Shoulder extension     Shoulder internal rotation     Shoulder external rotation     Middle trapezius     Lower trapezius     Elbow flexion     Elbow extension     Wrist flexion     Wrist extension     Wrist ulnar deviation     Wrist radial deviation     Wrist pronation     Wrist supination     Grip strength      (Blank rows = not tested)   TODAY'S TREATMENT:    [facility] Adult PT Treatment:                                                DATE: 03/18/2023 Therapeutic Exercise: UBE L5 Fwd/Bkwd x2 min each UT/LS stretches x30" each B Self pin& stretch SCM Supine thoracic extension (towel roll at mid back): cactus arms, arm circles 1/2 roll down + passive arm hang with 3#DB Shoulder extension BTB x10 --> added cervical rotation x 5 Multifidi pulses GTB x20 Cat/cow x10 Thred the needle stretch x5 B Shoulder MMT (see above) Overhead press 4# - 6# MB --> walking + static overhead press 6#MB 4#MB toss/catch on rebounder  Overhead quick ball dribble orange SB 2x30" Small ball wall arm ABCs  (B) Thoracic stretch at wall (hands behind neck, elbow slide up wall)    [facility] Adult PT Treatment:                                                DATE: 03/13/2023 Therapeutic Exercise: UBE L5 36min fwd/2 min bkwd + subjective intake Active ulnar nerve  glide x15 UT/LS stretches (R) 2x30" each Cervical rotation SNAGs 5x3" Supine over deflated coregeous ball: shoulder flexion -->cactus arms Prone high I/Y/T/low I 2#DB x10 each Prone shoulder extension 2#DB 2x10 Standing rows GTB x10 Standing shoulder extension GTB x10 S/L shoulder ER 2#DB 2x10 S/L shoulder abd + alt IR/ER 2#DB x10 Manual Therapy: Manual Therapy:  Trigger Point Dry-Needling  Treatment instructions: Expect mild to moderate muscle soreness. S/S of pneumothorax if dry needled over a lung field, and to seek immediate medical attention should they occur. Patient verbalized understanding of these instructions and education.  Patient Consent Given: Yes Education handout provided: Yes Muscles treated: upper trap, thoracic paraspinals Electrical stimulation performed: No Parameters: N/A Treatment response/outcome: palpable increase in muscle length                                                                                                                      PATIENT EDUCATION:  Education details: PT POC and goals, HEP, dry needling Person educated: Patient Education method: Explanation, Demonstration, and Handouts Education comprehension: verbalized understanding and returned demonstration  HOME EXERCISE PROGRAM: Access Code: [email or web address] URL: [email or web address] Date: 03/13/2023 Prepared by: Helane Gunther  Exercises - Seated Cervical Retraction  - 1 x daily - 7 x weekly - 2 sets - 10 reps - 2-3 seconds hold - Sidelying Open Book Thoracic Rotation with Knee on Foam Roll  - 1 x daily - 7 x weekly - 1 sets - 10 reps - 10 seconds hold - Doorway Pec Stretch at 90 Degrees Abduction  - 1 x daily - 7 x weekly  - 1 sets - 10 reps - 10 seconds hold - Ulnar Nerve Flossing  - 1 x daily - 7 x weekly - 2 sets - 10 reps - Upper Trapezius Stretch  - 1 x daily - 7 x weekly - 3 sets - 10 reps - Seated Levator Scapulae Stretch  - 1 x daily - 7 x weekly - 3 sets - 10 reps - Seated Scapular Retraction  - 1 x daily - 7 x weekly - 3 sets - 10 reps - Supine Scapular Retraction in External Rotation  - 1 x daily - 7 x weekly - 3 sets - 10 reps - Prone Single Arm Shoulder Y  - 1 x daily - 7 x weekly - 3 sets - 10 reps - Prone Shoulder Horizontal Abduction  - 1 x daily - 7 x weekly - 3 sets - 10 reps - Prone Shoulder Extension - Single Arm  - 1 x daily - 7 x weekly - 3 sets - 10 reps  ASSESSMENT:  CLINICAL IMPRESSION:  Patient demonstrated good progression with R shoulder strength and improved pain-free ROM. Perturbation exercises incorporated to challenge shoulder stability and postural strength. Patient able to complete all exercises with no exacerbation of symptoms.  OBJECTIVE IMPAIRMENTS: decreased activity tolerance, decreased ROM, decreased strength, and pain.     GOALS: Goals reviewed with patient? Yes  SHORT TERM GOALS: Target date: 03/13/2023  Pt will be independent with initial HEP Baseline:  Goal status: INITIAL   LONG TERM GOALS: Target date: 04/10/2023  Pt will be independent with advanced HEP Baseline:  Goal status: INITIAL  2.  Pt will improve UE strength to 4+/5 to improve tolerance to IADLS Baseline:  Goal status: INITIAL  3.  Pt will improve FOTO to >= 71 to demo improved functional mobility Baseline:  Goal status: INITIAL  4.  Pt will report pain in Rt shoulder/neck to <= 3/10 on 5/7 days Baseline:  Goal status: INITIAL  5.  Pt will report "tingling" in Rt fingers <= 1/7 days Baseline:  Goal status: INITIAL   PLAN:  PT FREQUENCY: 1-2x/week  PT DURATION: 6 weeks  PLANNED INTERVENTIONS: Therapeutic exercises, Therapeutic activity, Neuromuscular re-education, Balance  training, Gait training, Patient/Family education, Self Care, Joint mobilization, Aquatic Therapy, Dry Needling, Electrical stimulation, Cryotherapy, Moist heat, Taping, Traction, Ultrasound, Manual therapy, and Re-evaluation  PLAN FOR NEXT SESSION: DN next visit. Challenge postural strength, thoracic mobility, cervical stretching   Hardin Negus, PTA 03/18/2023, 12:25 PM

## 2023-03-20 ENCOUNTER — Ambulatory Visit: Payer: 59

## 2023-03-20 DIAGNOSIS — M5412 Radiculopathy, cervical region: Secondary | ICD-10-CM | POA: Diagnosis not present

## 2023-03-20 DIAGNOSIS — R293 Abnormal posture: Secondary | ICD-10-CM

## 2023-03-20 DIAGNOSIS — M6281 Muscle weakness (generalized): Secondary | ICD-10-CM

## 2023-03-20 NOTE — Therapy (Addendum)
OUTPATIENT PHYSICAL THERAPY TREATMENT AND DISCHARGE   Patient Name: Mary Robles MRN: [id number] DOB:September 05, 1976, 47 y.o., female Today's Date: 03/20/2023  END OF SESSION:  PT End of Session - 03/20/23 1145     Visit Number 6    Number of Visits 12    Date for PT Re-Evaluation 04/10/23    PT Start Time 1145    PT Stop Time 1225    PT Time Calculation (min) 40 min    Activity Tolerance Patient tolerated treatment well    Behavior During Therapy [facility] for tasks assessed/performed              Past Medical History:  Diagnosis Date   Allergy    Angio-edema    Anxiety    Asthma    no symptoms in "years", no inhaler   Depression    Fibromyalgia 2021   GERD (gastroesophageal reflux disease)    Herpes simplex    Hiatal hernia    Hyperlipidemia due to dietary fat intake    Hyperlysinemia    Hypertension    Hypokalemia    Lipoma of abdominal wall s/p excision 05/04/2014 05/19/2014   Sickle cell trait    Status post total abdominal hysterectomy 05/04/2014   Vitamin D deficiency    Past Surgical History:  Procedure Laterality Date   62 HOUR [place] STUDY N/A 07/08/2019   Procedure: 24 HOUR [place] STUDY;  Surgeon: Tressia Danas, MD;  Location: [facility];  Service: Gastroenterology;  Laterality: N/A;   ABDOMINAL HYSTERECTOMY N/A 05/04/2014   Procedure: HYSTERECTOMY ABDOMINAL with BILATERAL SALPINGECTOMY ;  Surgeon: Jacqualin Combes de Gwenevere Ghazi, MD;  Location: [facility];  Service: Gynecology;  Laterality: N/A;   BREAST BIOPSY Right 12/2014   benign fibroadenoma   ESOPHAGEAL MANOMETRY N/A 07/08/2019   Procedure: ESOPHAGEAL MANOMETRY (EM);  Surgeon: Tressia Danas, MD;  Location: [facility];  Service: Gastroenterology;  Laterality: N/A;   FOOT SURGERY     LAPAROTOMY N/A 05/04/2014   Procedure: REMOVAL OF ABD WALL MASS ;  Surgeon: Ardeth Sportsman, MD;  Location: [facility];  Service: General;  Laterality: N/A;   LIPOMA EXCISION N/A 05/04/2014   Procedure: EXCISION LIPOMA  of abdominal wall;  Surgeon: Jacqualin Combes de Gwenevere Ghazi, MD;  Location: [facility];  Service: Gynecology;  Laterality: N/A;   NASAL SINUS SURGERY  2019   PELVIC LAPAROSCOPY Right 2018   oophorectomy   TUBAL LIGATION     UPPER GASTROINTESTINAL ENDOSCOPY     Patient Active Problem List   Diagnosis Date Noted   Radiculitis of right cervical region 02/19/2023   Chronic sinusitis 05/25/2022   Plantar fasciitis, bilateral 04/05/2022   Fibromyalgia 02/16/2020   Cough    Gastroesophageal reflux disease    Heartburn    Chest pain with low risk for cardiac etiology 09/17/2018   DOE (dyspnea on exertion) 09/17/2018   Obesity (BMI 30.0-34.9) 07/02/2017   MDD (major depressive disorder) 07/16/2016   Asthma, mild 04/11/2015   GAD (generalized anxiety disorder) 02/08/2015   Hyperlipidemia 07/09/2014   Insomnia 07/09/2014   Hypertension    Vitamin D deficiency     PCP: Larinda Buttery  REFERRING PROVIDER: Thekkekandam  REFERRING DIAG: cervical radiculitis  THERAPY DIAG:  Radiculopathy, cervical region  Abnormal posture  Muscle weakness (generalized)  Rationale for Evaluation and Treatment: Rehabilitation  ONSET DATE: 02/06/23  SUBJECTIVE:  SUBJECTIVE STATEMENT: Patient reports no pain level today, states she has mild tingling in fingers due to sleeping on her side last night.   PERTINENT HISTORY:  Pt fell asleep in a funny position on a long car ride and since then has had Rt shoulder and neck pain, tingling into Rt fingers on ulnar side. She states the pain is there "all the time", tingling is intermittent. Pain decreases with use of heat  PAIN:  Are you having pain? Yes: NPRS scale: 5/10 Pain location: Rt UE Pain description: numb, hurts Aggravating factors: constant pain Relieving  factors: heat  PRECAUTIONS: None  WEIGHT BEARING RESTRICTIONS: No  FALLS:  Has patient fallen in last 6 months? 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  PLOF: Independent  PATIENT GOALS: decrease pain  NEXT MD VISIT: 04/02/23  OBJECTIVE:    PATIENT SURVEYS:  FOTO 59 03/18/23: 96   SENSATION: Intermittent "tingling" Rt 4th and 5th fingers  POSTURE: rounded shoulders and forward head  PALPATION: TTP and increased mm spasticity Rt paraspinals C3-T4, Rt upper trap Cervical jt mobility WFL Increased pain with manual cervical distraction   CERVICAL ROM:   Active ROM A/PROM (deg) eval  Flexion 40  Extension 50  Right lateral flexion 48  Left lateral flexion 25  Right rotation 55  Left rotation 55   (Blank rows = not tested)   UPPER EXTREMITY MMT:  MMT Right eval Left eval Right  03/18/23  Shoulder flexion 3 pain 4+ 4+ No pain  Shoulder extension     Shoulder abduction 4 4+ 4+  Shoulder adduction     Shoulder extension     Shoulder internal rotation     Shoulder external rotation     Middle trapezius     Lower trapezius     Elbow flexion     Elbow extension     Wrist flexion     Wrist extension     Wrist ulnar deviation     Wrist radial deviation     Wrist pronation     Wrist supination     Grip strength      (Blank rows = not tested)   TODAY'S TREATMENT:    [facility] Adult PT Treatment:                                                DATE: 03/20/2023 Therapeutic Exercise: UBE L5 Fwd/Bkwd x2 min each Shoulder extension: low BTB, mid & high GTB 10x3" each Cat/cow x5 Thred the needle stretch (hand behind head) x5 B  Self-massage with theracane Ulnar nerve flossing (prayer hands) Median nerve glide B Wall push-ups (triangle hands) x10 Incline plank on mat table --> added slow mountain climbers Quadruped scaption raises YTB --> added opposite straight leg hip ext Thoracic stretch at wall (hands behind neck, elbow slide up wall) T-arms stretch with  back against wall --> added palm flip Manual Therapy: Manual Therapy:  Trigger Point Dry-Needling  Treatment instructions: Expect mild to moderate muscle soreness. S/S of pneumothorax if dry needled over a lung field, and to seek immediate medical attention should they occur. Patient verbalized understanding of these instructions and education.  Patient Consent Given: Yes Education handout provided: Yes Muscles treated: upper trap, thoracic paraspinals Electrical stimulation performed: No Parameters: N/A Treatment response/outcome: palpable increase in muscle length    [facility] Adult PT Treatment:  DATE: 03/18/2023 Therapeutic Exercise: UBE L5 Fwd/Bkwd x2 min each UT/LS stretches x30" each B Self pin& stretch SCM Supine thoracic extension (towel roll at mid back): cactus arms, arm circles 1/2 roll down + passive arm hang with 3#DB Shoulder extension BTB x10 --> added cervical rotation x 5 Multifidi pulses GTB x20 Cat/cow x10 Thred the needle stretch x5 B Shoulder MMT (see above) Overhead press 4# - 6# MB --> walking + static overhead press 6#MB 4#MB toss/catch on rebounder  Overhead quick ball dribble orange SB 2x30" Small ball wall arm ABCs (B) Thoracic stretch at wall (hands behind neck, elbow slide up wall)                                                                                                                      PATIENT EDUCATION:  Education details: Monitoring tingling/pain symptoms on return to work Person educated: Patient Education method: Programmer, multimedia, Facilities manager, and Handouts Education comprehension: verbalized understanding and returned demonstration  HOME EXERCISE PROGRAM: Access Code: [email or web address] URL: [email or web address] Date: 03/13/2023 Prepared by: Carlynn Herald  Exercises - Seated Cervical Retraction  - 1 x daily - 7 x weekly - 2 sets - 10 reps - 2-3 seconds hold - Sidelying Open Book  Thoracic Rotation with Knee on Foam Roll  - 1 x daily - 7 x weekly - 1 sets - 10 reps - 10 seconds hold - Doorway Pec Stretch at 90 Degrees Abduction  - 1 x daily - 7 x weekly - 1 sets - 10 reps - 10 seconds hold - Ulnar Nerve Flossing  - 1 x daily - 7 x weekly - 2 sets - 10 reps - Upper Trapezius Stretch  - 1 x daily - 7 x weekly - 3 sets - 10 reps - Seated Levator Scapulae Stretch  - 1 x daily - 7 x weekly - 3 sets - 10 reps - Seated Scapular Retraction  - 1 x daily - 7 x weekly - 3 sets - 10 reps - Supine Scapular Retraction in External Rotation  - 1 x daily - 7 x weekly - 3 sets - 10 reps - Prone Single Arm Shoulder Y  - 1 x daily - 7 x weekly - 3 sets - 10 reps - Prone Shoulder Horizontal Abduction  - 1 x daily - 7 x weekly - 3 sets - 10 reps - Prone Shoulder Extension - Single Arm  - 1 x daily - 7 x weekly - 3 sets - 10 reps  ASSESSMENT:  CLINICAL IMPRESSION:  Patient able to complete all exercises with no exacerbation of symptoms. Core strengthening component added in exercises to challenge postural stability.     OBJECTIVE IMPAIRMENTS: decreased activity tolerance, decreased ROM, decreased strength, and pain.     GOALS: Goals reviewed with patient? Yes  SHORT TERM GOALS: Target date: 03/13/2023  Pt will be independent with initial HEP Baseline:  Goal status: MET   LONG TERM GOALS: Target date: 04/10/2023  Pt will be independent with advanced  HEP Baseline:  Goal status: MET  2.  Pt will improve UE strength to 4+/5 to improve tolerance to IADLS Baseline:  Goal status: MET  3.  Pt will improve FOTO to >= 71 to demo improved functional mobility Baseline:  Goal status: MET  4.  Pt will report pain in Rt shoulder/neck to <= 3/10 on 5/7 days Baseline:  Goal status: IN PROGRESS (4/10 pain)  5.  Pt will report "tingling" in Rt fingers <= 1/7 days Baseline:  Goal status: IN PROGRESS   PLAN:  PT FREQUENCY: 1-2x/week  PT DURATION: 6 weeks  PLANNED INTERVENTIONS:  Therapeutic exercises, Therapeutic activity, Neuromuscular re-education, Balance training, Gait training, Patient/Family education, Self Care, Joint mobilization, Aquatic Therapy, Dry Needling, Electrical stimulation, Cryotherapy, Moist heat, Taping, Traction, Ultrasound, Manual therapy, and Re-evaluation  PLAN FOR NEXT SESSION: Patient will follow up after MD visit on 4/16 if proceeding with PT. Possible D/C? PHYSICAL THERAPY DISCHARGE SUMMARY  Visits from Start of Care: 6  Current functional level related to goals / functional outcomes: Decreased pain, improved strength   Remaining deficits: Mild tingling   Education / Equipment: HEP   Patient agrees to discharge. Patient goals were partially met. Patient is being discharged due to being pleased with the current functional level.  Reggy Eye, PT,DPT04/24/242:37 PM    Sanjuana Mae, PTA 03/20/2023, 12:27 PM Reggy Eye, PT,DPT04/05/248:14 AM

## 2023-04-01 ENCOUNTER — Encounter: Payer: Self-pay | Admitting: *Deleted

## 2023-04-02 ENCOUNTER — Ambulatory Visit: Payer: 59 | Admitting: Sports Medicine

## 2023-04-02 ENCOUNTER — Telehealth: Payer: Self-pay | Admitting: Sports Medicine

## 2023-04-02 NOTE — Telephone Encounter (Signed)
I spoke to Mary Robles, she was feeling better so we canceled her appointment.

## 2023-04-07 ENCOUNTER — Other Ambulatory Visit: Payer: Self-pay | Admitting: Medical-Surgical

## 2023-04-08 ENCOUNTER — Other Ambulatory Visit: Payer: Self-pay | Admitting: Medical-Surgical

## 2023-04-10 ENCOUNTER — Encounter: Payer: Self-pay | Admitting: Obstetrics and Gynecology

## 2023-04-10 ENCOUNTER — Other Ambulatory Visit: Payer: Self-pay | Admitting: Obstetrics and Gynecology

## 2023-04-10 MED ORDER — VALACYCLOVIR HCL 500 MG PO TABS: 500.0000 mg | ORAL_TABLET | Freq: Two times a day (BID) | ORAL | 0 refills | Status: DC

## 2023-04-10 NOTE — Progress Notes (Signed)
Rx for Valtrex per patient request.

## 2023-04-10 NOTE — Telephone Encounter (Signed)
Dr. Edward Jolly -please advise on valtrex RX.  AEX 09/19/22 Hx HSV Valtrex last prescribed 06/23/2019

## 2023-04-18 ENCOUNTER — Encounter: Payer: 59 | Admitting: Medical-Surgical

## 2023-04-18 NOTE — Progress Notes (Signed)
Erroneous encounter. Please disregard.

## 2023-04-23 ENCOUNTER — Ambulatory Visit (INDEPENDENT_AMBULATORY_CARE_PROVIDER_SITE_OTHER): Payer: 59 | Admitting: Medical-Surgical

## 2023-04-23 DIAGNOSIS — Z91199 Patient's noncompliance with other medical treatment and regimen due to unspecified reason: Secondary | ICD-10-CM

## 2023-04-23 NOTE — Progress Notes (Unsigned)
        Established patient visit  History, exam, impression, and plan:  Primary female hypogonadism Up-to-date testosterone labs.  Due for his next testosterone injection.  Doing well on his current regimen with stable vitals.  Testosterone cypionate 100 mg IM given x 1 in the office.  Due for next shot in 2 weeks.  Lump of skin of lower extremity, left Noted a lump on the left posterior calf approximately 1 month ago.  Initially, had redness, tenderness, swelling at the area however this has fully resolved.  No longer has any residual symptoms but the lump has remained.  His wife noticed it and urged him to be seen to evaluate it.  History notable for varicose veins, compliant with recommendations for compression socks daily.  No recent injury or trauma to the area.  Unclear etiology.  The lump is approximately 1 cm x 1.5 cm and slightly discolored.  See clinical photo.  Plan to get vascular ultrasound as it does seem to be connected to one of the varicose veins that runs through the area.  Suspect superficial thrombophlebitis initially.  Discussed conservative measures with Tylenol, heat, ice, and compression socks.    Procedures performed this visit: None.  Return in about 2 weeks (around 03/27/2023) for nurse visit for testosterone shot.  __________________________________ Trayton Szabo L. Josphine Laffey, DNP, APRN, FNP-BC Primary Care and Sports Medicine [facility]  

## 2023-05-12 ENCOUNTER — Other Ambulatory Visit: Payer: Self-pay | Admitting: Medical-Surgical

## 2023-05-13 ENCOUNTER — Other Ambulatory Visit: Payer: Self-pay | Admitting: Medical-Surgical

## 2023-05-13 DIAGNOSIS — J4520 Mild intermittent asthma, uncomplicated: Secondary | ICD-10-CM

## 2023-05-23 ENCOUNTER — Other Ambulatory Visit: Payer: Self-pay | Admitting: Medical-Surgical

## 2023-05-23 ENCOUNTER — Encounter: Payer: Self-pay | Admitting: Medical-Surgical

## 2023-05-27 ENCOUNTER — Ambulatory Visit (INDEPENDENT_AMBULATORY_CARE_PROVIDER_SITE_OTHER): Payer: 59 | Admitting: Medical-Surgical

## 2023-05-27 VITALS — BP 166/107 | HR 96 | Ht 65.0 in | Wt 209.0 lb

## 2023-05-27 DIAGNOSIS — R0683 Snoring: Secondary | ICD-10-CM | POA: Diagnosis not present

## 2023-05-27 DIAGNOSIS — J4520 Mild intermittent asthma, uncomplicated: Secondary | ICD-10-CM | POA: Diagnosis not present

## 2023-05-27 DIAGNOSIS — J0141 Acute recurrent pansinusitis: Secondary | ICD-10-CM

## 2023-05-27 DIAGNOSIS — I10 Essential (primary) hypertension: Secondary | ICD-10-CM | POA: Diagnosis not present

## 2023-05-27 DIAGNOSIS — Z131 Encounter for screening for diabetes mellitus: Secondary | ICD-10-CM

## 2023-05-27 DIAGNOSIS — E559 Vitamin D deficiency, unspecified: Secondary | ICD-10-CM

## 2023-05-27 LAB — CBC WITH DIFFERENTIAL/PLATELET
HCT: 44.9 % (ref 35.0–45.0)
Hemoglobin: 14.6 g/dL (ref 11.7–15.5)
Lymphs Abs: 2965 cells/uL (ref 850–3900)
MCHC: 32.5 g/dL (ref 32.0–36.0)
Neutro Abs: 4082 cells/uL (ref 1500–7800)
RBC: 5.49 10*6/uL — ABNORMAL HIGH (ref 3.80–5.10)
RDW: 15.2 % — ABNORMAL HIGH (ref 11.0–15.0)
WBC: 8.1 10*3/uL (ref 3.8–10.8)

## 2023-05-27 MED ORDER — PANTOPRAZOLE SODIUM 20 MG PO TBEC: 20.0000 mg | DELAYED_RELEASE_TABLET | Freq: Every day | ORAL | 11 refills | Status: DC

## 2023-05-27 MED ORDER — DULOXETINE HCL 30 MG PO CPEP: 30.0000 mg | ORAL_CAPSULE | Freq: Every day | ORAL | 11 refills | Status: DC

## 2023-05-27 MED ORDER — MONTELUKAST SODIUM 10 MG PO TABS
ORAL_TABLET | ORAL | 11 refills | Status: DC
Start: 2023-05-27 — End: 2024-05-19

## 2023-05-27 MED ORDER — AMOXICILLIN-POT CLAVULANATE 875-125 MG PO TABS: 1.0000 | ORAL_TABLET | Freq: Two times a day (BID) | ORAL | 0 refills | Status: DC

## 2023-05-27 MED ORDER — EPINEPHRINE 0.3 MG/0.3ML IJ SOAJ: 0.3000 mg | Freq: Once | INTRAMUSCULAR | 0 refills | Status: DC | PRN

## 2023-05-27 MED ORDER — CLONIDINE HCL 0.1 MG PO TABS: ORAL_TABLET | ORAL | 11 refills | Status: DC

## 2023-05-27 MED ORDER — FLUCONAZOLE 150 MG PO TABS: 150.0000 mg | ORAL_TABLET | Freq: Once | ORAL | 0 refills | Status: AC

## 2023-05-27 MED ORDER — CETIRIZINE HCL 10 MG PO TABS: ORAL_TABLET | ORAL | 11 refills | Status: DC

## 2023-05-27 NOTE — Progress Notes (Addendum)
Established patient visit  History, exam, impression, and plan:  1. Primary hypertension Mary Robles 47 year old female presenting to follow-up on hypertension.  She is currently taking valsartan-HCTZ 320-25 mg daily and clonidine 0.1 mg twice daily.  Ran out of of clonidine about 4 days ago as she was overdue for follow-up and did not get refills.  Does not check blood pressure at home.  Working to follow a low-sodium diet.  Activity as tolerated.  Has been having headaches and mild transient chest pain over the last few days that she equates to elevated blood pressure.  See below for physical exam.  Checking labs as below.  Continue valsartan-HCTZ as prescribed.  Restart clonidine 0.1 mg twice daily.  If possible, monitor blood pressure at home with a goal of 130/80 or less. - CBC with Differential/Platelet - COMPLETE METABOLIC PANEL WITH GFR - Lipid panel  2. Loud snoring Endorses loud snoring and often wakes herself from snoring so loud.  Has not been told that she stops breathing or sleep but does have history of hypertension as well as daytime fatigue and nonrestorative sleep.  Ordering home sleep study for further evaluation. - Home sleep test  3. Vitamin D deficiency Taking vitamin D. - VITAMIN D 25 Hydroxy (Vit-D Deficiency, Fractures)  4. Mild intermittent asthma without complication Using Singulair 10 mg nightly along with Zyrtec 10 mg daily.  Has albuterol inhaler but does not have to use this frequently.  Feels that symptoms are well-controlled at this time. - montelukast (SINGULAIR) 10 MG tablet; TAKE 1 TABLET BY MOUTH AT BEDTIME . APPOINTMENT REQUIRED FOR FUTURE REFILLS  Dispense: 30 tablet; Refill: 11  5. Diabetes mellitus screening Checking hemoglobin A1c. - Hemoglobin A1c  6. Acute recurrent pansinusitis History of chronic sinusitis with frequent flares of symptoms.  She has been to ENT as well as had surgery in the past.  Unfortunately, even with the measures that  they recommended, she still has issues with flares of sinusitis.  Notes that she has had 3 weeks of worsened sinus discomfort and is now very tender over the bilateral frontal and maxillary sinuses.  See below for physical exam.  Continue Flonase, cetirizine, Singulair, and saline rinses.  Treating with Augmentin.  Diflucan for VVC prophylaxis.  Physical Exam Vitals reviewed.  Constitutional:      General: She is not in acute distress.    Appearance: Normal appearance. She is obese. She is not ill-appearing.  HENT:     Head: Normocephalic and atraumatic.     Nose: Congestion present. No rhinorrhea.     Right Sinus: Maxillary sinus tenderness and frontal sinus tenderness present.     Left Sinus: Maxillary sinus tenderness and frontal sinus tenderness present.  Eyes:     General: No scleral icterus.       Right eye: No discharge.        Left eye: No discharge.     Extraocular Movements: Extraocular movements intact.     Conjunctiva/sclera: Conjunctivae normal.     Pupils: Pupils are equal, round, and reactive to light.  Cardiovascular:     Rate and Rhythm: Normal rate and regular rhythm.     Pulses: Normal pulses.     Heart sounds: Normal heart sounds. No murmur heard.    No friction rub. No gallop.  Pulmonary:     Effort: Pulmonary effort is normal. No respiratory distress.     Breath sounds: Normal breath sounds. No wheezing.  Skin:  General: Skin is warm and dry.  Neurological:     Mental Status: She is alert and oriented to person, place, and time.  Psychiatric:        Mood and Affect: Mood normal.        Behavior: Behavior normal.        Thought Content: Thought content normal.        Judgment: Judgment normal.      Procedures performed this visit: None.  Return in about 2 weeks (around 06/10/2023) for nurse visit for BP check.  __________________________________ Thayer Ohm, DNP, APRN, FNP-BC Primary Care and Sports Medicine [facility]

## 2023-05-28 LAB — CBC WITH DIFFERENTIAL/PLATELET
Absolute Monocytes: 859 cells/uL (ref 200–950)
Basophils Absolute: 57 cells/uL (ref 0–200)
Basophils Relative: 0.7 %
Eosinophils Absolute: 138 cells/uL (ref 15–500)
Eosinophils Relative: 1.7 %
MCH: 26.6 pg — ABNORMAL LOW (ref 27.0–33.0)
MCV: 81.8 fL (ref 80.0–100.0)
MPV: 10.6 fL (ref 7.5–12.5)
Monocytes Relative: 10.6 %
Neutrophils Relative %: 50.4 %
Platelets: 400 10*3/uL (ref 140–400)
Total Lymphocyte: 36.6 %

## 2023-05-28 LAB — COMPLETE METABOLIC PANEL WITH GFR
AG Ratio: 1.3 (calc) (ref 1.0–2.5)
ALT: 18 U/L (ref 6–29)
AST: 16 U/L (ref 10–35)
Albumin: 4.4 g/dL (ref 3.6–5.1)
Alkaline phosphatase (APISO): 48 U/L (ref 31–125)
BUN: 9 mg/dL (ref 7–25)
CO2: 26 mmol/L (ref 20–32)
Calcium: 10.2 mg/dL (ref 8.6–10.2)
Chloride: 99 mmol/L (ref 98–110)
Creat: 0.9 mg/dL (ref 0.50–0.99)
Globulin: 3.4 g/dL (calc) (ref 1.9–3.7)
Glucose, Bld: 94 mg/dL (ref 65–99)
Potassium: 3.4 mmol/L — ABNORMAL LOW (ref 3.5–5.3)
Sodium: 139 mmol/L (ref 135–146)
Total Bilirubin: 0.4 mg/dL (ref 0.2–1.2)
Total Protein: 7.8 g/dL (ref 6.1–8.1)
eGFR: 79 mL/min/{1.73_m2} (ref >=60)

## 2023-05-28 LAB — VITAMIN D 25 HYDROXY (VIT D DEFICIENCY, FRACTURES): Vit D, 25-Hydroxy: 32 ng/mL (ref 30–100)

## 2023-05-28 LAB — HEMOGLOBIN A1C
Hgb A1c MFr Bld: 5.8 % of total Hgb — ABNORMAL HIGH (ref <5.7)
Mean Plasma Glucose: 120 mg/dL
eAG (mmol/L): 6.6 mmol/L

## 2023-05-28 LAB — LIPID PANEL
Cholesterol: 206 mg/dL — ABNORMAL HIGH (ref <200)
HDL: 43 mg/dL — ABNORMAL LOW (ref >=50)
LDL Cholesterol (Calc): 124 mg/dL (calc) — ABNORMAL HIGH
Non-HDL Cholesterol (Calc): 163 mg/dL (calc) — ABNORMAL HIGH (ref <130)
Total CHOL/HDL Ratio: 4.8 (calc) (ref <5.0)
Triglycerides: 241 mg/dL — ABNORMAL HIGH (ref <150)

## 2023-05-30 MED ORDER — ATORVASTATIN CALCIUM 10 MG PO TABS: 10.0000 mg | ORAL_TABLET | Freq: Every day | ORAL | 3 refills | Status: DC

## 2023-05-30 NOTE — Addendum Note (Signed)
Addended byChristen Butter on: 05/30/2023 07:27 AM   Modules accepted: Orders

## 2023-05-31 ENCOUNTER — Other Ambulatory Visit: Payer: Self-pay

## 2023-05-31 DIAGNOSIS — E782 Mixed hyperlipidemia: Secondary | ICD-10-CM

## 2023-05-31 DIAGNOSIS — I10 Essential (primary) hypertension: Secondary | ICD-10-CM

## 2023-06-10 ENCOUNTER — Ambulatory Visit (INDEPENDENT_AMBULATORY_CARE_PROVIDER_SITE_OTHER): Payer: 59 | Admitting: Medical-Surgical

## 2023-06-10 VITALS — BP 114/75 | HR 81 | Ht 65.0 in | Wt 207.0 lb

## 2023-06-10 DIAGNOSIS — I10 Essential (primary) hypertension: Secondary | ICD-10-CM | POA: Diagnosis not present

## 2023-06-10 NOTE — Progress Notes (Signed)
Agree with documentation as below.  ___________________________________________ Pearly Bartosik L. Eliyah Bazzi, DNP, APRN, FNP-BC Primary Care and Sports Medicine [facility]  

## 2023-06-10 NOTE — Progress Notes (Signed)
Patient is here for blood pressure check.   Previous BP was 166/107. Pt to restart Clonidine 0.1mg  tab.   1st BP today: 114/75  Denies chest pain, dizziness, shortness of breath, severe headache, or nosebleeds.  Taking medication as prescribed. Denies missed doses.   Pt has lab appt in a few weeks and will schedule follow-up appt with [place] then.

## 2023-06-14 ENCOUNTER — Other Ambulatory Visit: Payer: Self-pay | Admitting: Medical-Surgical

## 2023-06-14 DIAGNOSIS — J4520 Mild intermittent asthma, uncomplicated: Secondary | ICD-10-CM

## 2023-06-25 ENCOUNTER — Other Ambulatory Visit: Payer: Self-pay

## 2023-06-25 ENCOUNTER — Encounter: Payer: Self-pay | Admitting: Medical-Surgical

## 2023-06-25 ENCOUNTER — Ambulatory Visit: Payer: 59 | Admitting: Medical-Surgical

## 2023-06-25 VITALS — BP 114/75 | HR 87 | Resp 20 | Ht 65.0 in | Wt 203.0 lb

## 2023-06-25 DIAGNOSIS — J329 Chronic sinusitis, unspecified: Secondary | ICD-10-CM

## 2023-06-25 DIAGNOSIS — L989 Disorder of the skin and subcutaneous tissue, unspecified: Secondary | ICD-10-CM | POA: Diagnosis not present

## 2023-06-25 NOTE — Progress Notes (Signed)
        Established patient visit  History, exam, impression, and plan:  1. Skin lesion Pleasant 47 year old female presenting today for skin lesion removal.  Has had the dark brown-colored firm lesion on the right upper arm for approximately 2 years.  See below for procedure note.  2. Chronic sinusitis, unspecified location History of chronic sinusitis and has been evaluated by ENT as well as allergy.  She is following instructions with her current medications however her symptoms are not well-managed.  We treated her for acute pansinusitis several weeks ago with Augmentin however she did not notice much of an improvement.  Over the last week, her symptoms have gotten much worse.  Briefly discussed recommendations for a steroid burst but she would like to avoid taking prednisone.  She has not been back to follow-up with the allergist so I recommended she touch base with them regarding possible further steps with allergy shots.  Patient verbalized understanding.   Procedures performed this visit: After informed consent was obtained, using chlorhexidine for cleansing and 1% Lidocaine with epinephrine for anesthetic, with sterile technique, shave excision and electrocautery was performed. Antibiotic dressing is applied, and wound care instructions provided.  Be alert for any signs of cutaneous infection. The procedure was well tolerated without complications. Follow up: the specimen is labeled and sent to pathology for evaluation, the patient may return prn.   Return if symptoms worsen or fail to improve.  __________________________________ Thayer Ohm, DNP, APRN, FNP-BC Primary Care and Sports Medicine [facility] [place]

## 2023-07-01 ENCOUNTER — Ambulatory Visit
Admission: EM | Admit: 2023-07-01 | Discharge: 2023-07-01 | Disposition: A | Discharge status: Home / Self Care | Payer: 59 | Attending: Family Medicine | Admitting: Family Medicine

## 2023-07-01 ENCOUNTER — Other Ambulatory Visit: Payer: Self-pay

## 2023-07-01 ENCOUNTER — Encounter: Payer: Self-pay | Admitting: Emergency Medicine

## 2023-07-01 DIAGNOSIS — H1033 Unspecified acute conjunctivitis, bilateral: Secondary | ICD-10-CM

## 2023-07-01 MED ORDER — POLYMYXIN B-TRIMETHOPRIM 10000-0.1 UNIT/ML-% OP SOLN: 1.0000 [drp] | OPHTHALMIC | 0 refills | Status: DC

## 2023-07-01 NOTE — Discharge Instructions (Signed)
Use eye drops every 4 h while awake until symptoms clear.  5-7 days

## 2023-07-01 NOTE — ED Provider Notes (Signed)
[facility]    CSN: [id number] Arrival date & time: 07/01/23  1436      History   Chief Complaint Chief Complaint  Patient presents with   Eye Problem    HPI Mary Robles is a 47 y.o. female.   HPI Eye irritation for 2 days.  Started L and now is in both.  Eye itching and pain, thick yellow discharge in the morning Vision normal Wears no contac lens  Past Medical History:  Diagnosis Date   Allergy    Angio-edema    Anxiety    Asthma    no symptoms in "years", no inhaler   Depression    Fibromyalgia 2021   GERD (gastroesophageal reflux disease)    Herpes simplex    Hiatal hernia    Hyperlipidemia due to dietary fat intake    Hyperlysinemia (HCC)    Hypertension    Hypokalemia    Lipoma of abdominal wall s/p excision 05/04/2014 05/19/2014   Sickle cell trait (HCC)    Status post total abdominal hysterectomy 05/04/2014   Vitamin D deficiency     Patient Active Problem List   Diagnosis Date Noted   Radiculitis of right cervical region 02/19/2023   Chronic sinusitis 05/25/2022   Plantar fasciitis, bilateral 04/05/2022   Fibromyalgia 02/16/2020   Cough    Gastroesophageal reflux disease    Heartburn    Acute recurrent pansinusitis 04/29/2019   Chest pain with low risk for cardiac etiology 09/17/2018   DOE (dyspnea on exertion) 09/17/2018   Seasonal allergic rhinitis 03/14/2018   Obesity (BMI 30.0-34.9) 07/02/2017   Deviated septum 01/22/2017   Nasal turbinate hypertrophy 01/22/2017   MDD (major depressive disorder) 07/16/2016   Asthma, mild 04/11/2015   GAD (generalized anxiety disorder) 02/08/2015   Hyperlipidemia 07/09/2014   Insomnia 07/09/2014   Hypertension    Vitamin D deficiency     Past Surgical History:  Procedure Laterality Date   34 HOUR PH STUDY N/A 07/08/2019   Procedure: 24 HOUR PH STUDY;  Surgeon: Tressia Danas, MD;  Location: [facility];  Service: Gastroenterology;  Laterality: N/A;   ABDOMINAL HYSTERECTOMY N/A  05/04/2014   Procedure: HYSTERECTOMY ABDOMINAL with BILATERAL SALPINGECTOMY ;  Surgeon: Jacqualin Combes de Gwenevere Ghazi, MD;  Location: [facility];  Service: Gynecology;  Laterality: N/A;   BREAST BIOPSY Right 12/2014   benign fibroadenoma   ESOPHAGEAL MANOMETRY N/A 07/08/2019   Procedure: ESOPHAGEAL MANOMETRY (EM);  Surgeon: Tressia Danas, MD;  Location: [facility];  Service: Gastroenterology;  Laterality: N/A;   FOOT SURGERY     LAPAROTOMY N/A 05/04/2014   Procedure: REMOVAL OF ABD WALL MASS ;  Surgeon: Ardeth Sportsman, MD;  Location: [facility];  Service: General;  Laterality: N/A;   LIPOMA EXCISION N/A 05/04/2014   Procedure: EXCISION LIPOMA of abdominal wall;  Surgeon: Jacqualin Combes de Gwenevere Ghazi, MD;  Location: [facility];  Service: Gynecology;  Laterality: N/A;   NASAL SINUS SURGERY  2019   PELVIC LAPAROSCOPY Right 2018   oophorectomy   TUBAL LIGATION     UPPER GASTROINTESTINAL ENDOSCOPY      OB History     Gravida  4   Para  2   Term  2   Preterm      AB  2   Living  2      SAB  1   IAB  1   Ectopic      Multiple      Live  Births               Home Medications    Prior to Admission medications   Medication Sig Start Date End Date Taking? Authorizing Provider  trimethoprim-polymyxin b (POLYTRIM) ophthalmic solution Place 1 drop into both eyes every 4 (four) hours. 07/01/23  Yes Eustace Moore, MD  atorvastatin (LIPITOR) 10 MG tablet Take 1 tablet (10 mg total) by mouth daily. 05/30/23   Christen Butter, NP  cetirizine (EQ ALLERGY RELIEF, CETIRIZINE,) 10 MG tablet TAKE 1 TABLET BY MOUTH ONCE DAILY . 05/27/23   Christen Butter, NP  cholecalciferol (VITAMIN D3) 25 MCG (1000 UNIT) tablet Take 1,000 Units by mouth daily.    [provider]  cloNIDine (CATAPRES) 0.1 MG tablet TAKE 1 TABLET BY MOUTH TWICE DAILY . 05/27/23   Christen Butter, NP  DULoxetine (CYMBALTA) 30 MG capsule Take 1 capsule (30 mg total) by mouth daily. 05/27/23   Christen Butter, NP   EPINEPHrine (EPIPEN 2-PAK) 0.3 mg/0.3 mL IJ SOAJ injection Inject 0.3 mg into the muscle once as needed (for an anaphylactic reaction). 05/27/23   Christen Butter, NP  fluticasone (FLONASE) 50 MCG/ACT nasal spray Place into both nostrils. 08/08/22   [provider]  meloxicam (MOBIC) 15 MG tablet One tab PO every 24 hours with a meal for 2 weeks, then once every 24 hours prn pain. 02/19/23   Monica Becton, MD  montelukast (SINGULAIR) 10 MG tablet TAKE 1 TABLET BY MOUTH AT BEDTIME . APPOINTMENT REQUIRED FOR FUTURE REFILLS 05/27/23   Christen Butter, NP  pantoprazole (PROTONIX) 20 MG tablet Take 1 tablet (20 mg total) by mouth daily. 05/27/23   Christen Butter, NP  valACYclovir (VALTREX) 500 MG tablet Take 1 tablet (500 mg total) by mouth 2 (two) times daily. Take 1 tablet twice a day for 3 days for an outbreak. 04/10/23   Patton Salles, MD  valsartan-hydrochlorothiazide (DIOVAN-HCT) 320-25 MG tablet Take 1 tablet by mouth daily. 11/27/22   Christen Butter, NP    Family History Family History  Problem Relation Age of Onset   Diabetes Mother    Thyroid disease Mother    Colon polyps Mother    Stroke Father    Mental retardation Maternal Aunt    Diabetes Maternal Grandmother    Colon cancer Neg Hx    Esophageal cancer Neg Hx    Stomach cancer Neg Hx    Rectal cancer Neg Hx     Social History Social History   Tobacco Use   Smoking status: Never   Smokeless tobacco: Never  Vaping Use   Vaping status: Never Used  Substance Use Topics   Alcohol use: Not Currently    Alcohol/week: 0.0 standard drinks of alcohol   Drug use: Never     Allergies   Aldactone [spironolactone], Benazepril, Betadine [povidone iodine], Calcitonin (salmon), Other, Vicodin [hydrocodone-acetaminophen], and Shellfish allergy   Review of Systems Review of Systems See HPI  Physical Exam Triage Vital Signs ED Triage Vitals  Encounter Vitals Group     BP 07/01/23 1459 130/82     Systolic BP  Percentile --      Diastolic BP Percentile --      Pulse Rate 07/01/23 1459 87     Resp 07/01/23 1459 18     Temp 07/01/23 1459 98.3 F (36.8 C)     Temp Source 07/01/23 1459 Oral     SpO2 07/01/23 1459 98 %     Weight --  Height --      Head Circumference --      Peak Flow --      Pain Score 07/01/23 1455 8     Pain Loc --      Pain Education --      Exclude from Growth Chart --    No data found.  Updated Vital Signs BP 130/82 (BP Location: Left Arm) Comment (BP Location): large cuff  Pulse 87   Temp 98.3 F (36.8 C) (Oral)   Resp 18   LMP 04/17/2014 Comment: hysterectomy w/ single oophorectomy//a.c.  SpO2 98%      Physical Exam Constitutional:      General: She is not in acute distress.    Appearance: She is well-developed.  HENT:     Head: Normocephalic and atraumatic.  Eyes:     General:        Right eye: Discharge present.        Left eye: Discharge present.    Pupils: Pupils are equal, round, and reactive to light.     Comments: Mild injection with yellow dc  Cardiovascular:     Rate and Rhythm: Normal rate.  Pulmonary:     Effort: Pulmonary effort is normal. No respiratory distress.  Abdominal:     General: There is no distension.     Palpations: Abdomen is soft.  Musculoskeletal:        General: Normal range of motion.     Cervical back: Normal range of motion.  Skin:    General: Skin is warm and dry.  Neurological:     Mental Status: She is alert.      UC Treatments / Results  Labs (all labs ordered are listed, but only abnormal results are displayed) Labs Reviewed - No data to display  EKG   Radiology No results found.  Procedures Procedures (including critical care time)  Medications Ordered in UC Medications - No data to display  Initial Impression / Assessment and Plan / UC Course  I have reviewed the triage vital signs and the nursing notes.  Pertinent labs & imaging results that were available during my care of the  patient were reviewed by me and considered in my medical decision making (see chart for details).    Final Clinical Impressions(s) / UC Diagnoses   Final diagnoses:  Acute bacterial conjunctivitis of both eyes     Discharge Instructions      Use eye drops every 4 h while awake until symptoms clear.  5-7 days     ED Prescriptions     Medication Sig Dispense Auth. Provider   trimethoprim-polymyxin b (POLYTRIM) ophthalmic solution Place 1 drop into both eyes every 4 (four) hours. 10 mL Eustace Moore, MD      PDMP not reviewed this encounter.   Eustace Moore, MD 07/01/23 1520

## 2023-07-01 NOTE — ED Triage Notes (Addendum)
Right eye red last night.  This morning, eye lids were stuck together, watery, itching.  Left eye is starting to have same symptoms as right eye.  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

## 2023-08-15 ENCOUNTER — Other Ambulatory Visit: Payer: Self-pay

## 2023-08-15 DIAGNOSIS — I10 Essential (primary) hypertension: Secondary | ICD-10-CM

## 2023-08-15 DIAGNOSIS — E782 Mixed hyperlipidemia: Secondary | ICD-10-CM

## 2023-08-16 LAB — CMP14+EGFR
ALT: 18 IU/L (ref 0–32)
AST: 13 IU/L (ref 0–40)
Albumin: 4.4 g/dL (ref 3.9–4.9)
Alkaline Phosphatase: 61 IU/L (ref 44–121)
BUN/Creatinine Ratio: 11 (ref 9–23)
BUN: 11 mg/dL (ref 6–24)
Bilirubin Total: 0.6 mg/dL (ref 0.0–1.2)
CO2: 23 mmol/L (ref 20–29)
Calcium: 9.4 mg/dL (ref 8.7–10.2)
Chloride: 100 mmol/L (ref 96–106)
Creatinine, Ser: 1.04 mg/dL — ABNORMAL HIGH (ref 0.57–1.00)
Globulin, Total: 2.8 g/dL (ref 1.5–4.5)
Glucose: 88 mg/dL (ref 70–99)
Potassium: 3.6 mmol/L (ref 3.5–5.2)
Sodium: 138 mmol/L (ref 134–144)
Total Protein: 7.2 g/dL (ref 6.0–8.5)
eGFR: 67 mL/min/{1.73_m2} (ref >59)

## 2023-08-16 LAB — LIPID PANEL
Chol/HDL Ratio: 3.9 ratio (ref 0.0–4.4)
Cholesterol, Total: 130 mg/dL (ref 100–199)
HDL: 33 mg/dL — ABNORMAL LOW
LDL Chol Calc (NIH): 79 mg/dL (ref 0–99)
Triglycerides: 97 mg/dL (ref 0–149)
VLDL Cholesterol Cal: 18 mg/dL (ref 5–40)

## 2023-08-28 ENCOUNTER — Encounter (HOSPITAL_BASED_OUTPATIENT_CLINIC_OR_DEPARTMENT_OTHER): Payer: Self-pay

## 2023-08-28 ENCOUNTER — Emergency Department (HOSPITAL_BASED_OUTPATIENT_CLINIC_OR_DEPARTMENT_OTHER)
Admission: EM | Admit: 2023-08-28 | Discharge: 2023-08-28 | Disposition: A | Discharge status: Home / Self Care | Payer: 59 | Attending: Emergency Medicine | Admitting: Emergency Medicine

## 2023-08-28 ENCOUNTER — Emergency Department (HOSPITAL_BASED_OUTPATIENT_CLINIC_OR_DEPARTMENT_OTHER): Payer: 59

## 2023-08-28 ENCOUNTER — Other Ambulatory Visit: Payer: Self-pay

## 2023-08-28 DIAGNOSIS — M25512 Pain in left shoulder: Secondary | ICD-10-CM | POA: Insufficient documentation

## 2023-08-28 DIAGNOSIS — W11XXXA Fall on and from ladder, initial encounter: Secondary | ICD-10-CM | POA: Diagnosis not present

## 2023-08-28 DIAGNOSIS — M79672 Pain in left foot: Secondary | ICD-10-CM | POA: Insufficient documentation

## 2023-08-28 DIAGNOSIS — I10 Essential (primary) hypertension: Secondary | ICD-10-CM | POA: Insufficient documentation

## 2023-08-28 DIAGNOSIS — S20211A Contusion of right front wall of thorax, initial encounter: Secondary | ICD-10-CM | POA: Diagnosis not present

## 2023-08-28 DIAGNOSIS — S20219A Contusion of unspecified front wall of thorax, initial encounter: Secondary | ICD-10-CM

## 2023-08-28 DIAGNOSIS — M79671 Pain in right foot: Secondary | ICD-10-CM | POA: Insufficient documentation

## 2023-08-28 DIAGNOSIS — W19XXXA Unspecified fall, initial encounter: Secondary | ICD-10-CM

## 2023-08-28 DIAGNOSIS — S299XXA Unspecified injury of thorax, initial encounter: Secondary | ICD-10-CM | POA: Diagnosis present

## 2023-08-28 MED ORDER — KETOROLAC TROMETHAMINE 10 MG PO TABS
10.0000 mg | ORAL_TABLET | Freq: Once | ORAL | Status: AC
  Administered 2023-08-28: 10 mg via ORAL
  Filled 2023-08-28: qty 1

## 2023-08-28 NOTE — ED Provider Notes (Signed)
[facility] Provider Note   CSN: [id number] Arrival date & time: 08/28/23  1604     History  Chief Complaint  Patient presents with   Mary Robles is a 47 y.o. female, history of hypertension, sickle cell trait, fibromyalgia, who presents to the ED secondary to a fall off a ladder, that occurred yesterday.  She states she fell off the ladder, and rolled to the ground.  Denies hitting her head, or neck pain.  Denies any heavy, nausea, vomiting.  Is not any blood thinners.  Reports left armpit pain, right sided chest pain, as well as bilateral foot pain.  Denies any wounds.  Denies any dizziness, chest pain, shortness of breath prior to the fall.  States the ladder just tilted, and she fell that way.  Has tried Tylenol last night without any relief.  Home Medications    Allergies    Aldactone [spironolactone], Benazepril, Betadine [povidone iodine], Calcitonin (salmon), Other, Vicodin [hydrocodone-acetaminophen], and Shellfish allergy    Review of Systems   Review of Systems  Musculoskeletal:  Positive for back pain. Negative for neck pain.    Physical Exam Updated Vital Signs BP (!) 141/102 (BP Location: Right Arm)   Pulse 75   Temp 97.7 F (36.5 C) (Oral)   Resp 18   Ht 5\' 5"  (1.651 m)   Wt 89.4 kg   LMP 04/17/2014 Comment: hysterectomy w/ single oophorectomy//a.c.  SpO2 100%   BMI 32.78 kg/m  Physical Exam Vitals and nursing note reviewed.  Constitutional:      General: She is not in acute distress.    Appearance: She is well-developed.  HENT:     Head: Normocephalic and atraumatic.  Eyes:     Conjunctiva/sclera: Conjunctivae normal.  Cardiovascular:     Rate and Rhythm: Normal rate and regular rhythm.     Heart sounds: No murmur heard. Pulmonary:     Effort: Pulmonary effort is normal. No respiratory distress.     Breath sounds: Normal breath sounds.  Abdominal:     Palpations: Abdomen is soft.      Tenderness: There is no abdominal tenderness.  Musculoskeletal:        General: No swelling.     Cervical back: Neck supple.     Comments: Tenderness to palpation of left axilla, as well as superior and lateral aspect of the scapula on the left side.  No cervical midline tenderness, or thoracic, lumbar tenderness.  Diffuse paraspinal muscle, tenderness of the back.  As well as tenderness to palpation of the right lower rib cage.  No step-offs, crepitus, edema, erythema, wounds noted.  Additionally has lateral fifth metatarsal pain, on bilateral feet.  Positive dorsalis pedis pulse, no neurodeficits.  Range of motion intact.  Skin:    General: Skin is warm and dry.     Capillary Refill: Capillary refill takes less than 2 seconds.  Neurological:     Mental Status: She is alert.  Psychiatric:        Mood and Affect: Mood normal.     ED Results / Procedures / Treatments   Labs (all labs ordered are listed, but only abnormal results are displayed) Labs Reviewed - No data to display  EKG None  Radiology DG Ribs Bilateral W/Chest  Result Date: 08/28/2023 CLINICAL DATA:  Fall and bilateral chest wall pain. EXAM: BILATERAL RIBS AND CHEST - 4+ VIEW COMPARISON:  None Available. FINDINGS: No fracture or other bone lesions  are seen involving the ribs. There is no evidence of pneumothorax or pleural effusion. Both lungs are clear. Heart size and mediastinal contours are within normal limits. IMPRESSION: Negative. Electronically Signed   By: Elgie Collard M.D.   On: 08/28/2023 19:21   DG Shoulder Left  Result Date: 08/28/2023 CLINICAL DATA:  Pain fell off ladder EXAM: LEFT SHOULDER - 2+ VIEW COMPARISON:  None Available. FINDINGS: There is no evidence of fracture or dislocation. There is no evidence of arthropathy or other focal bone abnormality. Soft tissues are unremarkable. IMPRESSION: Negative. Electronically Signed   By: Jasmine Pang M.D.   On: 08/28/2023 19:19   DG Foot Complete  Left  Result Date: 08/28/2023 CLINICAL DATA:  Pain fell off ladder EXAM: LEFT FOOT - COMPLETE 3+ VIEW COMPARISON:  None Available. FINDINGS: No malalignment. Horizontal lucency through the lateral sesamoid bone at the head of first metatarsal. Joint spaces are within normal limits. Riko Lumsden plantar calcaneal spur. IMPRESSION: Horizontal lucency through the lateral sesamoid bone at the head of first metatarsal, this could represent bipartite sesamoid bone versus sesamoid fracture, correlate for point tenderness. Electronically Signed   By: Jasmine Pang M.D.   On: 08/28/2023 19:18   DG Foot Complete Right  Result Date: 08/28/2023 CLINICAL DATA:  Status post fall. EXAM: RIGHT FOOT COMPLETE - 3+ VIEW COMPARISON:  None Available. FINDINGS: There is no evidence of fracture or dislocation. There is a moderate-sized plantar calcaneal spur. There is no evidence of arthropathy or other focal bone abnormality. Soft tissues are unremarkable. IMPRESSION: No acute osseous abnormality. Electronically Signed   By: Aram Candela M.D.   On: 08/28/2023 19:16    Procedures Procedures    Medications Ordered in ED Medications  ketorolac (TORADOL) tablet 10 mg (10 mg Oral Given 08/28/23 1744)    ED Course/ Medical Decision Making/ A&P                                 Medical Decision Making Patient is a 47 year old female, who fell off a ladder about 3 feet yesterday, did not hit head or neck.  Complains of allover back pain, but there is no midline tenderness, as well as bilateral rib cage pain, left axillary and shoulder pain.  We will obtain rib cage x-rays, foot x-ray secondary to fifth metatarsal tenderness, shoulder x-ray for further evaluation.  Toradol for pain control  Amount and/or Complexity of Data Reviewed Radiology: ordered.    Details: Unremarkable except for possible sesamoid fracture of the first metatarsal Discussion of management or test interpretation with external provider(s): Reevaluated  patient, has no tenderness to palpation of the first metatarsal, I think this is likely just some unique anatomy, she has no fractures noted, on x-rays, is overall well-appearing.  Improved after the Toradol, will have her follow-up with her PCP, return if symptoms worsen.  Discharged home in no acute distress.  No evidence of crepitus, edema, or bruising noted or any open wounds  Risk Prescription drug management.    Final Clinical Impression(s) / ED Diagnoses Final diagnoses:  Fall, initial encounter  Contusion of rib, unspecified laterality, initial encounter  Bilateral foot pain  Acute pain of left shoulder    Rx / DC Orders ED Discharge Orders     None         Tollie Canada, [place], PA 08/28/23 1930    Glyn Ade, MD 08/29/23 1601

## 2023-08-28 NOTE — ED Triage Notes (Signed)
Pt presents to ED from home after fall off step ladder yesterday. Pt states she fell approximately 3 feet. Denies hitting head. C/O bilateral ankle pain, L side pain mostly but is sore all over.

## 2023-08-28 NOTE — Discharge Instructions (Addendum)
Your x-rays are reassuring today there is no evidence of fracture, there was possible fracture, on the left big toe, however you have no pain there, thus I think it is likely secondary to unique anatomy.  Follow-up with your PCP, and return to the ER if you feel like symptoms are worsening.  If you have blood in your urine that is new, and extreme abdominal pain, or difficulty breathing, return to the ER.  Take Tylenol and ibuprofen for your pain

## 2023-09-11 ENCOUNTER — Other Ambulatory Visit: Payer: Self-pay

## 2023-09-11 ENCOUNTER — Ambulatory Visit
Admission: EM | Admit: 2023-09-11 | Discharge: 2023-09-11 | Disposition: A | Discharge status: Home / Self Care | Payer: 59 | Attending: Family Medicine | Admitting: Family Medicine

## 2023-09-11 ENCOUNTER — Encounter: Payer: Self-pay | Admitting: Emergency Medicine

## 2023-09-11 DIAGNOSIS — H00021 Hordeolum internum right upper eyelid: Secondary | ICD-10-CM | POA: Diagnosis not present

## 2023-09-11 DIAGNOSIS — H02843 Edema of right eye, unspecified eyelid: Secondary | ICD-10-CM

## 2023-09-11 MED ORDER — FLUCONAZOLE 200 MG PO TABS: ORAL_TABLET | ORAL | 0 refills | Status: DC

## 2023-09-11 MED ORDER — CEPHALEXIN 500 MG PO CAPS: 500.0000 mg | ORAL_CAPSULE | Freq: Two times a day (BID) | ORAL | 0 refills | Status: AC

## 2023-09-11 NOTE — ED Provider Notes (Signed)
[facility]    CSN: [id number] Arrival date & time: 09/11/23  1502      History   Chief Complaint Chief Complaint  Patient presents with   swollen eyelid    HPI Mary Robles is a 47 y.o. female.   HPI Very pleasant 47 year old female presents with left eyelid swelling x 4 days.  PMH significant for fibromyalgia, HTN, and obesity.  Past Medical History:  Diagnosis Date   Allergy    Angio-edema    Anxiety    Asthma    no symptoms in "years", no inhaler   Depression    Fibromyalgia 2021   GERD (gastroesophageal reflux disease)    Herpes simplex    Hiatal hernia    Hyperlipidemia due to dietary fat intake    Hyperlysinemia (HCC)    Hypertension    Hypokalemia    Lipoma of abdominal wall s/p excision 05/04/2014 05/19/2014   Sickle cell trait (HCC)    Status post total abdominal hysterectomy 05/04/2014   Vitamin D deficiency     Patient Active Problem List   Diagnosis Date Noted   Radiculitis of right cervical region 02/19/2023   Chronic sinusitis 05/25/2022   Plantar fasciitis, bilateral 04/05/2022   Fibromyalgia 02/16/2020   Cough    Gastroesophageal reflux disease    Heartburn    Acute recurrent pansinusitis 04/29/2019   Chest pain with low risk for cardiac etiology 09/17/2018   DOE (dyspnea on exertion) 09/17/2018   Seasonal allergic rhinitis 03/14/2018   Obesity (BMI 30.0-34.9) 07/02/2017   Deviated septum 01/22/2017   Nasal turbinate hypertrophy 01/22/2017   MDD (major depressive disorder) 07/16/2016   Asthma, mild 04/11/2015   GAD (generalized anxiety disorder) 02/08/2015   Hyperlipidemia 07/09/2014   Insomnia 07/09/2014   Hypertension    Vitamin D deficiency     Past Surgical History:  Procedure Laterality Date   63 HOUR PH STUDY N/A 07/08/2019   Procedure: 24 HOUR PH STUDY;  Surgeon: Tressia Danas, MD;  Location: [facility];  Service: Gastroenterology;  Laterality: N/A;   ABDOMINAL HYSTERECTOMY N/A 05/04/2014   Procedure:  HYSTERECTOMY ABDOMINAL with BILATERAL SALPINGECTOMY ;  Surgeon: Jacqualin Combes de Gwenevere Ghazi, MD;  Location: [facility];  Service: Gynecology;  Laterality: N/A;   BREAST BIOPSY Right 12/2014   benign fibroadenoma   ESOPHAGEAL MANOMETRY N/A 07/08/2019   Procedure: ESOPHAGEAL MANOMETRY (EM);  Surgeon: Tressia Danas, MD;  Location: [facility];  Service: Gastroenterology;  Laterality: N/A;   FOOT SURGERY     LAPAROTOMY N/A 05/04/2014   Procedure: REMOVAL OF ABD WALL MASS ;  Surgeon: Ardeth Sportsman, MD;  Location: [facility];  Service: General;  Laterality: N/A;   LIPOMA EXCISION N/A 05/04/2014   Procedure: EXCISION LIPOMA of abdominal wall;  Surgeon: Jacqualin Combes de Gwenevere Ghazi, MD;  Location: [facility];  Service: Gynecology;  Laterality: N/A;   NASAL SINUS SURGERY  2019   PELVIC LAPAROSCOPY Right 2018   oophorectomy   TUBAL LIGATION     UPPER GASTROINTESTINAL ENDOSCOPY      OB History     Gravida  4   Para  2   Term  2   Preterm      AB  2   Living  2      SAB  1   IAB  1   Ectopic      Multiple      Live Births  Home Medications    Prior to Admission medications   Medication Sig Start Date End Date Taking? Authorizing Provider  cephALEXin (KEFLEX) 500 MG capsule Take 1 capsule (500 mg total) by mouth 2 (two) times daily for 7 days. 09/11/23 09/18/23 Yes Trevor Iha, FNP  fluconazole (DIFLUCAN) 200 MG tablet Take 1 tab p.o. now, may repeat 1 tab p.o. in 3 days if symptoms are not resolved. 09/11/23  Yes Trevor Iha, FNP  atorvastatin (LIPITOR) 10 MG tablet Take 1 tablet (10 mg total) by mouth daily. 05/30/23   Christen Butter, NP  cetirizine (EQ ALLERGY RELIEF, CETIRIZINE,) 10 MG tablet TAKE 1 TABLET BY MOUTH ONCE DAILY . 05/27/23   Christen Butter, NP  cholecalciferol (VITAMIN D3) 25 MCG (1000 UNIT) tablet Take 1,000 Units by mouth daily.    [provider]  cloNIDine (CATAPRES) 0.1 MG tablet TAKE 1 TABLET BY MOUTH TWICE DAILY .  05/27/23   Christen Butter, NP  DULoxetine (CYMBALTA) 30 MG capsule Take 1 capsule (30 mg total) by mouth daily. 05/27/23   Christen Butter, NP  EPINEPHrine (EPIPEN 2-PAK) 0.3 mg/0.3 mL IJ SOAJ injection Inject 0.3 mg into the muscle once as needed (for an anaphylactic reaction). 05/27/23   Christen Butter, NP  fluticasone (FLONASE) 50 MCG/ACT nasal spray Place into both nostrils. 08/08/22   [provider]  meloxicam (MOBIC) 15 MG tablet One tab PO every 24 hours with a meal for 2 weeks, then once every 24 hours prn pain. 02/19/23   Monica Becton, MD  montelukast (SINGULAIR) 10 MG tablet TAKE 1 TABLET BY MOUTH AT BEDTIME . APPOINTMENT REQUIRED FOR FUTURE REFILLS 05/27/23   Christen Butter, NP  pantoprazole (PROTONIX) 20 MG tablet Take 1 tablet (20 mg total) by mouth daily. 05/27/23   Christen Butter, NP  trimethoprim-polymyxin b (POLYTRIM) ophthalmic solution Place 1 drop into both eyes every 4 (four) hours. Patient not taking: Reported on 09/11/2023 07/01/23   Eustace Moore, MD  valACYclovir (VALTREX) 500 MG tablet Take 1 tablet (500 mg total) by mouth 2 (two) times daily. Take 1 tablet twice a day for 3 days for an outbreak. 04/10/23   Patton Salles, MD  valsartan-hydrochlorothiazide (DIOVAN-HCT) 320-25 MG tablet Take 1 tablet by mouth daily. 11/27/22   Christen Butter, NP    Family History Family History  Problem Relation Age of Onset   Diabetes Mother    Thyroid disease Mother    Colon polyps Mother    Stroke Father    Mental retardation Maternal Aunt    Diabetes Maternal Grandmother    Colon cancer Neg Hx    Esophageal cancer Neg Hx    Stomach cancer Neg Hx    Rectal cancer Neg Hx     Social History Social History   Tobacco Use   Smoking status: Never   Smokeless tobacco: Never  Vaping Use   Vaping status: Never Used  Substance Use Topics   Alcohol use: Not Currently    Alcohol/week: 0.0 standard drinks of alcohol   Drug use: Never     Allergies   Aldactone  [spironolactone], Benazepril, Betadine [povidone iodine], Calcitonin (salmon), Other, Vicodin [hydrocodone-acetaminophen], and Shellfish allergy   Review of Systems Review of Systems   Physical Exam Triage Vital Signs ED Triage Vitals  Encounter Vitals Group     BP      Systolic BP Percentile      Diastolic BP Percentile      Pulse  Resp      Temp      Temp src      SpO2      Weight      Height      Head Circumference      Peak Flow      Pain Score      Pain Loc      Pain Education      Exclude from Growth Chart    No data found.  Updated Vital Signs BP (!) 157/97 (BP Location: Right Arm)   Pulse 87   Temp 98.5 F (36.9 C) (Oral)   Resp 18   LMP 04/17/2014 Comment: hysterectomy w/ single oophorectomy//a.c.  SpO2 100%    Physical Exam Vitals and nursing note reviewed.  Constitutional:      General: She is not in acute distress.    Appearance: Normal appearance. She is obese. She is not ill-appearing.  HENT:     Head: Normocephalic and atraumatic.     Mouth/Throat:     Mouth: Mucous membranes are moist.     Pharynx: Oropharynx is clear.  Eyes:     Extraocular Movements: Extraocular movements intact.     Conjunctiva/sclera: Conjunctivae normal.     Pupils: Pupils are equal, round, and reactive to light.     Comments: Left upper eyelid: Hordeolum internum noted please see image below  Cardiovascular:     Rate and Rhythm: Normal rate and regular rhythm.     Pulses: Normal pulses.     Heart sounds: Normal heart sounds.  Pulmonary:     Effort: Pulmonary effort is normal.     Breath sounds: Normal breath sounds.  Musculoskeletal:        General: Normal range of motion.     Cervical back: Normal range of motion and neck supple.  Skin:    General: Skin is warm and dry.  Neurological:     General: No focal deficit present.     Mental Status: She is alert and oriented to person, place, and time. Mental status is at baseline.  Psychiatric:        Mood and  Affect: Mood normal.        Behavior: Behavior normal.        Thought Content: Thought content normal.      UC Treatments / Results  Labs (all labs ordered are listed, but only abnormal results are displayed) Labs Reviewed - No data to display  EKG   Radiology No results found.  Procedures Procedures (including critical care time)  Medications Ordered in UC Medications - No data to display  Initial Impression / Assessment and Plan / UC Course  I have reviewed the triage vital signs and the nursing notes.  Pertinent labs & imaging results that were available during my care of the patient were reviewed by me and considered in my medical decision making (see chart for details).     MDM: 1.  Hordeolum internum of right upper eyelid-Rx'd Keflex 500 mg capsule: Take 1 capsule twice daily x 7 days; 2.  Eyelid gland swelling, right-Rx'd Keflex 500 mg capsule: Take 1 capsule twice daily x 7 days. Patient to take medication as directed with food to completion.  Encouraged to increase daily water intake to 64 ounces per day while taking this medication.  Advised patient may use artificial tears, blink tears or contact lens solution for dry eyes.  Advised if symptoms worsen and/or unresolved please follow-up with PCP optometry/ophthalmology or here for  further evaluation.  Patient discharged home, hemodynamically stable. Final Clinical Impressions(s) / UC Diagnoses   Final diagnoses:  Hordeolum internum of right upper eyelid  Eyelid gland swelling, right     Discharge Instructions      Patient to take medication as directed with food to completion.  Encouraged to increase daily water intake to 64 ounces per day while taking this medication.  Advised patient may use artificial tears, blink tears or contact lens solution for dry eyes.  Advised if symptoms worsen and/or unresolved please follow-up with PCP optometry/ophthalmology or here for further evaluation.     ED Prescriptions      Medication Sig Dispense Auth. Provider   cephALEXin (KEFLEX) 500 MG capsule Take 1 capsule (500 mg total) by mouth 2 (two) times daily for 7 days. 14 capsule Trevor Iha, FNP   fluconazole (DIFLUCAN) 200 MG tablet Take 1 tab p.o. now, may repeat 1 tab p.o. in 3 days if symptoms are not resolved. 7 tablet Trevor Iha, FNP      PDMP not reviewed this encounter.   Trevor Iha, FNP 09/11/23 1715

## 2023-09-11 NOTE — ED Triage Notes (Signed)
Left upper eyelid swollen and itching.  Noticed Monday the itching.  No known injury.  Has been sitting on porch with flying insects, not sure if related.    Has used allergy eye drops, eye wash, allergy medicines and cold cloths

## 2023-09-11 NOTE — Discharge Instructions (Addendum)
Patient to take medication as directed with food to completion.  Encouraged to increase daily water intake to 64 ounces per day while taking this medication.  Advised patient may use artificial tears, blink tears or contact lens solution for dry eyes.  Advised if symptoms worsen and/or unresolved please follow-up with PCP optometry/ophthalmology or here for further evaluation.

## 2023-09-12 NOTE — Progress Notes (Signed)
47 y.o. Mary Robles Divorced [place] American female here for annual exam.    Having some hot flashes.  Not pursuing tx.   Has some muscle stiffness.   Taking Cymbalta for fibromyalgia.   No partner change.  Declines STD screening.   PCP:  Christen Butter, NP  Patient's last menstrual period was 04/17/2014.           Sexually active: Yes.    The current method of family planning is status post hysterectomy.    Exercising: Yes.     walking Smoker:  no  Health Maintenance: Pap:  01/21/14 neg: HR HPV neg History of abnormal Pap:  yes, hx of dysplasia & cryotherapy to cervix 2000  MMG:  01/07/23 Breast Density Cat C, BI-RADS CAT 2 benign Colonoscopy:  07/14/21 polyp due in 2027.  BMD:   n/a  Result  n/a TDaP:  2023 Gardasil:   no HIV: neg 2022 Hep C: neg 2022 Screening Labs:  PCP Flu vaccine and Covid vaccines discussed.    reports that she has never smoked. She has never used smokeless tobacco. She reports that she does not currently use alcohol. She reports that she does not use drugs.  Past Medical History:  Diagnosis Date   Allergy    Angio-edema    Anxiety    Asthma    no symptoms in "years", no inhaler   Depression    Fibromyalgia 2021   GERD (gastroesophageal reflux disease)    Herpes simplex    Hiatal hernia    Hyperlipidemia due to dietary fat intake    Hyperlysinemia (HCC)    Hypertension    Hypokalemia    Lipoma of abdominal wall s/p excision 05/04/2014 05/19/2014   Sickle cell trait (HCC)    Status post total abdominal hysterectomy 05/04/2014   Vitamin D deficiency     Past Surgical History:  Procedure Laterality Date   50 HOUR PH STUDY N/A 07/08/2019   Procedure: 24 HOUR PH STUDY;  Surgeon: Tressia Danas, MD;  Location: [facility];  Service: Gastroenterology;  Laterality: N/A;   ABDOMINAL HYSTERECTOMY N/A 05/04/2014   Procedure: HYSTERECTOMY ABDOMINAL with BILATERAL SALPINGECTOMY ;  Surgeon: Jacqualin Combes de Gwenevere Ghazi, MD;  Location: [facility];   Service: Gynecology;  Laterality: N/A;   BREAST BIOPSY Right 12/2014   benign fibroadenoma   ESOPHAGEAL MANOMETRY N/A 07/08/2019   Procedure: ESOPHAGEAL MANOMETRY (EM);  Surgeon: Tressia Danas, MD;  Location: [facility];  Service: Gastroenterology;  Laterality: N/A;   FOOT SURGERY     LAPAROTOMY N/A 05/04/2014   Procedure: REMOVAL OF ABD WALL MASS ;  Surgeon: Ardeth Sportsman, MD;  Location: [facility];  Service: General;  Laterality: N/A;   LIPOMA EXCISION N/A 05/04/2014   Procedure: EXCISION LIPOMA of abdominal wall;  Surgeon: Jacqualin Combes de Gwenevere Ghazi, MD;  Location: [facility];  Service: Gynecology;  Laterality: N/A;   NASAL SINUS SURGERY  2019   PELVIC LAPAROSCOPY Right 2018   oophorectomy   TUBAL LIGATION     UPPER GASTROINTESTINAL ENDOSCOPY      Current Outpatient Medications  Medication Sig Dispense Refill   atorvastatin (LIPITOR) 10 MG tablet Take 1 tablet (10 mg total) by mouth daily. 90 tablet 3   cetirizine (EQ ALLERGY RELIEF, CETIRIZINE,) 10 MG tablet TAKE 1 TABLET BY MOUTH ONCE DAILY . 30 tablet 11   cholecalciferol (VITAMIN D3) 25 MCG (1000 UNIT) tablet Take 1,000 Units by mouth daily.     cloNIDine (CATAPRES) 0.1 MG  tablet TAKE 1 TABLET BY MOUTH TWICE DAILY . 60 tablet 11   DULoxetine (CYMBALTA) 30 MG capsule Take 1 capsule (30 mg total) by mouth daily. 30 capsule 11   EPINEPHrine (EPIPEN 2-PAK) 0.3 mg/0.3 mL IJ SOAJ injection Inject 0.3 mg into the muscle once as needed (for an anaphylactic reaction). 2 each 0   fluticasone (FLONASE) 50 MCG/ACT nasal spray Place into both nostrils.     montelukast (SINGULAIR) 10 MG tablet TAKE 1 TABLET BY MOUTH AT BEDTIME . APPOINTMENT REQUIRED FOR FUTURE REFILLS 30 tablet 11   pantoprazole (PROTONIX) 20 MG tablet Take 1 tablet (20 mg total) by mouth daily. 30 tablet 11   trimethoprim-polymyxin b (POLYTRIM) ophthalmic solution Place 1 drop into both eyes every 4 (four) hours. 10 mL 0   valACYclovir (VALTREX) 500 MG tablet Take 1  tablet (500 mg total) by mouth 2 (two) times daily. Take 1 tablet twice a day for 3 days for an outbreak. 30 tablet 0   valsartan-hydrochlorothiazide (DIOVAN-HCT) 320-25 MG tablet Take 1 tablet by mouth daily. 90 tablet 3   meloxicam (MOBIC) 15 MG tablet One tab PO every 24 hours with a meal for 2 weeks, then once every 24 hours prn pain. (Patient not taking: Reported on 09/23/2023) 30 tablet 3   No current facility-administered medications for this visit.    Family History  Problem Relation Age of Onset   Diabetes Mother    Thyroid disease Mother    Colon polyps Mother    Stroke Father    Mental retardation Maternal Aunt    Diabetes Maternal Grandmother    Colon cancer Neg Hx    Esophageal cancer Neg Hx    Stomach cancer Neg Hx    Rectal cancer Neg Hx     Review of Systems  All other systems reviewed and are negative.   Exam:   BP 126/76 (BP Location: Left Arm, Patient Position: Sitting, Cuff Size: Normal)   Pulse 77   Ht 5\' 6"  (1.676 m)   Wt 202 lb (91.6 kg)   LMP 04/17/2014 Comment: hysterectomy w/ single oophorectomy//a.c.  SpO2 99%   BMI 32.60 kg/m     General appearance: alert, cooperative and appears stated age Head: normocephalic, without obvious abnormality, atraumatic Neck: no adenopathy, supple, symmetrical, trachea midline and thyroid normal to inspection and palpation Lungs: clear to auscultation bilaterally Breasts: normal appearance, no masses or tenderness, No nipple retraction or dimpling, No nipple discharge or bleeding, No axillary adenopathy Heart: regular rate and rhythm Abdomen: soft, non-tender; no masses, no organomegaly Extremities: extremities normal, atraumatic, no cyanosis or edema Skin: skin color, texture, turgor normal. No rashes or lesions Lymph nodes: cervical, supraclavicular, and axillary nodes normal. Neurologic: grossly normal  Pelvic: External genitalia:  no lesions              No abnormal inguinal nodes palpated.               Urethra:  normal appearing urethra with no masses, tenderness or lesions              Bartholins and Skenes: normal                 Vagina: normal appearing vagina with normal color and discharge, no lesions              Cervix:  absent              Pap taken: no Bimanual Exam:  Uterus:  absent  Adnexa: no mass, fullness, tenderness              Rectal exam: yes.  Confirms.              Anus:  normal sphincter tone, no lesions  Chaperone was present for exam:  Warren Lacy, CMA  Assessment:   Well woman visit with gynecologic exam. Status post TAH/bilateral salpingectomy/excision of abdominal wall lipoma. Status post laparoscopic right oophorectomy/excision of epiploic mass. Left ovary remains.  Hx HSV.  Genital.  Fibromyalgia.  On Cymbalta.  Plan: Mammogram screening discussed. Self breast awareness reviewed. Pap and HR HPV as above. Guidelines for Calcium, Vitamin D, regular exercise program including cardiovascular and weight bearing exercise. Rx for Valtrex.    Follow up annually and prn.

## 2023-09-23 ENCOUNTER — Encounter: Payer: Self-pay | Admitting: Obstetrics and Gynecology

## 2023-09-23 ENCOUNTER — Ambulatory Visit (INDEPENDENT_AMBULATORY_CARE_PROVIDER_SITE_OTHER): Payer: 59 | Admitting: Obstetrics and Gynecology

## 2023-09-23 VITALS — BP 126/76 | HR 77 | Ht 66.0 in | Wt 202.0 lb

## 2023-09-23 DIAGNOSIS — Z01419 Encounter for gynecological examination (general) (routine) without abnormal findings: Secondary | ICD-10-CM

## 2023-09-23 MED ORDER — VALACYCLOVIR HCL 500 MG PO TABS: 500.0000 mg | ORAL_TABLET | Freq: Two times a day (BID) | ORAL | 3 refills | Status: DC

## 2023-09-23 NOTE — Patient Instructions (Signed)

## 2023-11-19 ENCOUNTER — Ambulatory Visit
Admission: EM | Admit: 2023-11-19 | Discharge: 2023-11-19 | Disposition: A | Discharge status: Home / Self Care | Payer: 59 | Attending: Family Medicine | Admitting: Family Medicine

## 2023-11-19 ENCOUNTER — Other Ambulatory Visit: Payer: Self-pay

## 2023-11-19 ENCOUNTER — Encounter: Payer: Self-pay | Admitting: Emergency Medicine

## 2023-11-19 DIAGNOSIS — T7840XA Allergy, unspecified, initial encounter: Secondary | ICD-10-CM

## 2023-11-19 DIAGNOSIS — L234 Allergic contact dermatitis due to dyes: Secondary | ICD-10-CM

## 2023-11-19 DIAGNOSIS — L299 Pruritus, unspecified: Secondary | ICD-10-CM | POA: Diagnosis not present

## 2023-11-19 MED ORDER — METHYLPREDNISOLONE ACETATE 80 MG/ML IJ SUSP
80.0000 mg | Freq: Once | INTRAMUSCULAR | Status: AC
  Administered 2023-11-19: 80 mg via INTRAMUSCULAR

## 2023-11-19 MED ORDER — FLUOCINONIDE EMULSIFIED BASE 0.05 % EX CREA: 1.0000 | TOPICAL_CREAM | Freq: Two times a day (BID) | CUTANEOUS | 1 refills | Status: DC

## 2023-11-19 NOTE — Discharge Instructions (Signed)
May use antihistamines for the itching as discussed Apply Lidex 2 times a day until itching and discomfort go away.  Tattoo may always be slightly raised

## 2023-11-19 NOTE — ED Provider Notes (Signed)
[facility]    CSN: [id number] Arrival date & time: 11/19/23  1606      History   Chief Complaint Chief Complaint  Patient presents with   Rash    HPI Mary Robles is a 47 y.o. female.   Mary Robles had a tattoo placed on her right shoulder blade area about 5 weeks ago.  Over time it has gotten more, itchy, and bothersome for her.  Now she states she is itching all over.  She thinks it is from the tattoo.  She has had many tattoos and this is the first 1 that has bothered her.  It was an old tattoo that she had redone and darkened    Past Medical History:  Diagnosis Date   Allergy    Angio-edema    Anxiety    Asthma    no symptoms in "years", no inhaler   Depression    Fibromyalgia 2021   GERD (gastroesophageal reflux disease)    Herpes simplex    Hiatal hernia    Hyperlipidemia due to dietary fat intake    Hyperlysinemia (HCC)    Hypertension    Hypokalemia    Lipoma of abdominal wall s/p excision 05/04/2014 05/19/2014   Sickle cell trait (HCC)    Status post total abdominal hysterectomy 05/04/2014   Vitamin D deficiency     Patient Active Problem List   Diagnosis Date Noted   Radiculitis of right cervical region 02/19/2023   Chronic sinusitis 05/25/2022   Plantar fasciitis, bilateral 04/05/2022   Fibromyalgia 02/16/2020   Cough    Gastroesophageal reflux disease    Heartburn    Acute recurrent pansinusitis 04/29/2019   Chest pain with low risk for cardiac etiology 09/17/2018   DOE (dyspnea on exertion) 09/17/2018   Seasonal allergic rhinitis 03/14/2018   Obesity (BMI 30.0-34.9) 07/02/2017   Deviated septum 01/22/2017   Nasal turbinate hypertrophy 01/22/2017   MDD (major depressive disorder) 07/16/2016   Asthma, mild 04/11/2015   GAD (generalized anxiety disorder) 02/08/2015   Hyperlipidemia 07/09/2014   Insomnia 07/09/2014   Hypertension    Vitamin D deficiency     Past Surgical History:  Procedure Laterality Date   72 HOUR PH STUDY N/A  07/08/2019   Procedure: 24 HOUR PH STUDY;  Surgeon: Tressia Danas, MD;  Location: [facility];  Service: Gastroenterology;  Laterality: N/A;   ABDOMINAL HYSTERECTOMY N/A 05/04/2014   Procedure: HYSTERECTOMY ABDOMINAL with BILATERAL SALPINGECTOMY ;  Surgeon: Jacqualin Combes de Gwenevere Ghazi, MD;  Location: [facility];  Service: Gynecology;  Laterality: N/A;   BREAST BIOPSY Right 12/2014   benign fibroadenoma   ESOPHAGEAL MANOMETRY N/A 07/08/2019   Procedure: ESOPHAGEAL MANOMETRY (EM);  Surgeon: Tressia Danas, MD;  Location: [facility];  Service: Gastroenterology;  Laterality: N/A;   FOOT SURGERY     LAPAROTOMY N/A 05/04/2014   Procedure: REMOVAL OF ABD WALL MASS ;  Surgeon: Ardeth Sportsman, MD;  Location: [facility];  Service: General;  Laterality: N/A;   LIPOMA EXCISION N/A 05/04/2014   Procedure: EXCISION LIPOMA of abdominal wall;  Surgeon: Jacqualin Combes de Gwenevere Ghazi, MD;  Location: [facility];  Service: Gynecology;  Laterality: N/A;   NASAL SINUS SURGERY  2019   PELVIC LAPAROSCOPY Right 2018   oophorectomy   TUBAL LIGATION     UPPER GASTROINTESTINAL ENDOSCOPY      OB History     Gravida  4   Para  2   Term  2  Preterm      AB  2   Living  2      SAB  1   IAB  1   Ectopic      Multiple      Live Births               Home Medications    Prior to Admission medications   Medication Sig Start Date End Date Taking? Authorizing Provider  atorvastatin (LIPITOR) 10 MG tablet Take 1 tablet (10 mg total) by mouth daily. 05/30/23  Yes Christen Butter, NP  cetirizine (EQ ALLERGY RELIEF, CETIRIZINE,) 10 MG tablet TAKE 1 TABLET BY MOUTH ONCE DAILY . 05/27/23  Yes Christen Butter, NP  cholecalciferol (VITAMIN D3) 25 MCG (1000 UNIT) tablet Take 1,000 Units by mouth daily.   Yes [provider]  cloNIDine (CATAPRES) 0.1 MG tablet TAKE 1 TABLET BY MOUTH TWICE DAILY . 05/27/23  Yes Christen Butter, NP  DULoxetine (CYMBALTA) 30 MG capsule Take 1 capsule (30 mg total)  by mouth daily. 05/27/23  Yes Christen Butter, NP  EPINEPHrine (EPIPEN 2-PAK) 0.3 mg/0.3 mL IJ SOAJ injection Inject 0.3 mg into the muscle once as needed (for an anaphylactic reaction). 05/27/23  Yes Christen Butter, NP  fluocinonide-emollient (LIDEX-E) 0.05 % cream Apply 1 Application topically 2 (two) times daily. 11/19/23  Yes Eustace Moore, MD  fluticasone [facility]) 50 MCG/ACT nasal spray Place into both nostrils. 08/08/22  Yes [provider]  montelukast (SINGULAIR) 10 MG tablet TAKE 1 TABLET BY MOUTH AT BEDTIME . APPOINTMENT REQUIRED FOR FUTURE REFILLS 05/27/23  Yes Christen Butter, NP  pantoprazole (PROTONIX) 20 MG tablet Take 1 tablet (20 mg total) by mouth daily. 05/27/23  Yes Christen Butter, NP  valsartan-hydrochlorothiazide (DIOVAN-HCT) 320-25 MG tablet Take 1 tablet by mouth daily. 11/27/22  Yes Christen Butter, NP  meloxicam (MOBIC) 15 MG tablet One tab PO every 24 hours with a meal for 2 weeks, then once every 24 hours prn pain. Patient not taking: Reported on 09/23/2023 02/19/23   Monica Becton, MD    Family History Family History  Problem Relation Age of Onset   Diabetes Mother    Thyroid disease Mother    Colon polyps Mother    Stroke Father    Mental retardation Maternal Aunt    Diabetes Maternal Grandmother    Colon cancer Neg Hx    Esophageal cancer Neg Hx    Stomach cancer Neg Hx    Rectal cancer Neg Hx     Social History Social History   Tobacco Use   Smoking status: Never   Smokeless tobacco: Never  Vaping Use   Vaping status: Never Used  Substance Use Topics   Alcohol use: Not Currently    Alcohol/week: 0.0 standard drinks of alcohol   Drug use: Never     Allergies   Aldactone [spironolactone], Benazepril, Betadine [povidone iodine], Calcitonin (salmon), Other, Vicodin [hydrocodone-acetaminophen], and Shellfish allergy   Review of Systems Review of Systems See HPI  Physical Exam Triage Vital Signs ED Triage Vitals  Encounter Vitals Group      BP 11/19/23 1616 (!) 146/95     Systolic BP Percentile --      Diastolic BP Percentile --      Pulse Rate 11/19/23 1616 77     Resp 11/19/23 1616 18     Temp 11/19/23 1616 98.7 F (37.1 C)     Temp Source 11/19/23 1616 Oral     SpO2 11/19/23 1616 99 %  Weight 11/19/23 1618 210 lb (95.3 kg)     Height 11/19/23 1618 5\' 6"  (1.676 m)     Head Circumference --      Peak Flow --      Pain Score 11/19/23 1618 10     Pain Loc --      Pain Education --      Exclude from Growth Chart --    No data found.  Updated Vital Signs BP (!) 146/95 (BP Location: Right Arm)   Pulse 77   Temp 98.7 F (37.1 C) (Oral)   Resp 18   Ht 5\' 6"  (1.676 m)   Wt 95.3 kg   LMP 04/17/2014 Comment: hysterectomy w/ single oophorectomy//a.c.  SpO2 99%   BMI 33.89 kg/m      Physical Exam Constitutional:      General: She is not in acute distress.    Appearance: She is well-developed.  HENT:     Head: Normocephalic and atraumatic.  Eyes:     Conjunctiva/sclera: Conjunctivae normal.     Pupils: Pupils are equal, round, and reactive to light.  Cardiovascular:     Rate and Rhythm: Normal rate.  Pulmonary:     Effort: Pulmonary effort is normal. No respiratory distress.  Abdominal:     General: There is no distension.     Palpations: Abdomen is soft.  Musculoskeletal:        General: Normal range of motion.     Cervical back: Normal range of motion.  Skin:    General: Skin is warm and dry.     Findings: Rash present.     Comments: There is a tattoo on the right shoulder blade.  The ink markings are all raised and there is slight erythema to the surrounding skin.  Neurological:     Mental Status: She is alert.      UC Treatments / Results  Labs (all labs ordered are listed, but only abnormal results are displayed) Labs Reviewed - No data to display  EKG   Radiology No results found.  Procedures Procedures (including critical care time)  Medications Ordered in UC Medications   methylPREDNISolone acetate (DEPO-MEDROL) injection 80 mg (80 mg Intramuscular Given 11/19/23 1633)    Initial Impression / Assessment and Plan / UC Course  I have reviewed the triage vital signs and the nursing notes.  Pertinent labs & imaging results that were available during my care of the patient were reviewed by me and considered in my medical decision making (see chart for details).     Skin looks largely clear but the patient states she has severe itching.  Advised antihistamine for the itching.  Will give Lidex to use on the area.  Because of her frustration with the symptoms, we will give her a shot of Depo-Medrol to get rid of some of the itching and discomfort.  Follow-up here as needed Final Clinical Impressions(s) / UC Diagnoses   Final diagnoses:  Allergic reaction, initial encounter  Allergic contact dermatitis due to dyes     Discharge Instructions      May use antihistamines for the itching as discussed Apply Lidex 2 times a day until itching and discomfort go away.  Tattoo may always be slightly raised    ED Prescriptions     Medication Sig Dispense Auth. Provider   fluocinonide-emollient (LIDEX-E) 0.05 % cream Apply 1 Application topically 2 (two) times daily. 30 g Eustace Moore, MD      PDMP not reviewed this  encounter.   Eustace Moore, MD 11/19/23 [phone]

## 2023-11-19 NOTE — ED Triage Notes (Signed)
Patient states that she got a tattoo a month ago on her upper right back, now she is having a lot itching.  The area is red and a developed a rash.  Patient did take a Benadryl last night.

## 2023-11-26 ENCOUNTER — Other Ambulatory Visit: Payer: Self-pay | Admitting: Medical-Surgical

## 2023-12-26 ENCOUNTER — Other Ambulatory Visit: Payer: Self-pay | Admitting: Medical-Surgical

## 2024-01-10 ENCOUNTER — Ambulatory Visit: Payer: 59 | Admitting: Medical-Surgical

## 2024-01-10 VITALS — BP 127/83 | HR 79 | Resp 20 | Ht 66.0 in | Wt 204.1 lb

## 2024-01-10 DIAGNOSIS — M5412 Radiculopathy, cervical region: Secondary | ICD-10-CM

## 2024-01-10 DIAGNOSIS — J329 Chronic sinusitis, unspecified: Secondary | ICD-10-CM

## 2024-01-10 DIAGNOSIS — R0683 Snoring: Secondary | ICD-10-CM

## 2024-01-10 DIAGNOSIS — R7303 Prediabetes: Secondary | ICD-10-CM | POA: Diagnosis not present

## 2024-01-10 DIAGNOSIS — I10 Essential (primary) hypertension: Secondary | ICD-10-CM

## 2024-01-10 LAB — POCT GLYCOSYLATED HEMOGLOBIN (HGB A1C)
HbA1c, POC (controlled diabetic range): 5.6 % (ref 0.0–7.0)
Hemoglobin A1C: 5.6 % (ref 4.0–5.6)

## 2024-01-10 MED ORDER — VALSARTAN-HYDROCHLOROTHIAZIDE 320-25 MG PO TABS: 1.0000 | ORAL_TABLET | Freq: Every day | ORAL | 11 refills | Status: DC

## 2024-01-10 MED ORDER — MELOXICAM 15 MG PO TABS
ORAL_TABLET | ORAL | 3 refills | Status: DC
Start: 2024-01-10 — End: 2024-07-10

## 2024-01-10 MED ORDER — AMOXICILLIN-POT CLAVULANATE 875-125 MG PO TABS: 1.0000 | ORAL_TABLET | Freq: Two times a day (BID) | ORAL | 0 refills | Status: DC

## 2024-01-10 MED ORDER — METHYLPREDNISOLONE ACETATE 80 MG/ML IJ SUSP
80.0000 mg | Freq: Once | INTRAMUSCULAR | Status: AC
Start: 2024-01-10 — End: 2024-01-10
  Administered 2024-01-10: 80 mg via INTRAMUSCULAR

## 2024-01-10 NOTE — Progress Notes (Unsigned)
        Established patient visit  History, exam, impression, and plan:  1. Viral URI with cough (Primary) Mary Robles 48 year old female presenting today with complaints of 2 days of sinus congestion with nonproductive cough, post tussive nausea, ear pressure, headache and generalized malaise. Feels like she can't take a deep breath.  Has been using Atrovent nasal spray which is helpful. Taking Ibuprofen 400mg  every 4 hours as needed. Lives in an assisted living facility with her husband who had the flu last week. POCT flu and covid negative today. Refilling Atrovent. Adding tessalon perls for daytime cough. Start promethazine-DM for night-time cough management. Getting chest x-ray today. Adding Tamiflu due to exposure and current symptoms.  - POCT Influenza A/B - POC COVID-19 - ipratropium (ATROVENT) 0.03 % nasal spray; Place 2 sprays into both nostrils every 12 (twelve) hours.  Dispense: 30 mL; Refill: 0 - benzonatate (TESSALON) 100 MG capsule; Take 1 capsule (100 mg total) by mouth 3 (three) times daily as needed for up to 7 days for cough.  Dispense: 21 capsule; Refill: 0 - promethazine-dextromethorphan (PROMETHAZINE-DM) 6.25-15 MG/5ML syrup; Take 5 mLs by mouth 4 (four) times daily as needed for cough.  Dispense: 118 mL; Refill: 0 - DG Chest 2 View; Future  Physical Exam Vitals reviewed.  Constitutional:      General: She is not in acute distress.    Appearance: Normal appearance. She is not ill-appearing.  HENT:     Head: Normocephalic and atraumatic.     Right Ear: Ear canal and external ear normal. There is no impacted cerumen.     Left Ear: Ear canal and external ear normal. There is no impacted cerumen.     Nose: Congestion and rhinorrhea present.     Mouth/Throat:     Mouth: Mucous membranes are moist.     Pharynx: Oropharyngeal exudate (clear) and posterior oropharyngeal erythema (mild) present.  Eyes:     General: No scleral icterus.       Right eye: No discharge.        Left  eye: No discharge.     Extraocular Movements: Extraocular movements intact.     Conjunctiva/sclera: Conjunctivae normal.     Pupils: Pupils are equal, round, and reactive to light.  Cardiovascular:     Rate and Rhythm: Normal rate and regular rhythm.     Pulses: Normal pulses.     Heart sounds: Normal heart sounds. No murmur heard.    No friction rub. No gallop.  Pulmonary:     Effort: Pulmonary effort is normal. No respiratory distress.     Breath sounds: Normal breath sounds. No wheezing.  Musculoskeletal:     Cervical back: Neck supple.  Lymphadenopathy:     Cervical: No cervical adenopathy.  Skin:    General: Skin is warm and dry.  Neurological:     Mental Status: She is alert and oriented to person, place, and time.  Psychiatric:        Mood and Affect: Mood normal.        Behavior: Behavior normal.        Thought Content: Thought content normal.        Judgment: Judgment normal.   Procedures performed this visit: None.  Return if symptoms worsen or fail to improve.  __________________________________ Thayer Ohm, DNP, APRN, FNP-BC Primary Care and Sports Medicine [facility] [place]

## 2024-02-17 ENCOUNTER — Encounter: Payer: Self-pay | Admitting: Medical-Surgical

## 2024-02-17 ENCOUNTER — Encounter

## 2024-03-13 ENCOUNTER — Other Ambulatory Visit (HOSPITAL_BASED_OUTPATIENT_CLINIC_OR_DEPARTMENT_OTHER): Payer: Self-pay | Admitting: Medical-Surgical

## 2024-03-13 DIAGNOSIS — Z1231 Encounter for screening mammogram for malignant neoplasm of breast: Secondary | ICD-10-CM

## 2024-03-18 ENCOUNTER — Ambulatory Visit: Payer: 59

## 2024-03-18 DIAGNOSIS — Z1231 Encounter for screening mammogram for malignant neoplasm of breast: Secondary | ICD-10-CM

## 2024-03-20 ENCOUNTER — Encounter: Payer: Self-pay | Admitting: Medical-Surgical

## 2024-05-01 ENCOUNTER — Other Ambulatory Visit: Payer: Self-pay | Admitting: Medical-Surgical

## 2024-05-17 ENCOUNTER — Other Ambulatory Visit: Payer: Self-pay | Admitting: Medical-Surgical

## 2024-05-17 DIAGNOSIS — J4520 Mild intermittent asthma, uncomplicated: Secondary | ICD-10-CM

## 2024-05-18 ENCOUNTER — Other Ambulatory Visit: Payer: Self-pay

## 2024-05-18 MED ORDER — CETIRIZINE HCL 10 MG PO TABS: ORAL_TABLET | ORAL | 0 refills | Status: DC

## 2024-06-04 ENCOUNTER — Other Ambulatory Visit: Payer: Self-pay | Admitting: Medical-Surgical

## 2024-06-14 ENCOUNTER — Other Ambulatory Visit: Payer: Self-pay | Admitting: Medical-Surgical

## 2024-06-14 DIAGNOSIS — J4520 Mild intermittent asthma, uncomplicated: Secondary | ICD-10-CM

## 2024-06-15 ENCOUNTER — Other Ambulatory Visit: Payer: Self-pay | Admitting: Medical-Surgical

## 2024-06-16 ENCOUNTER — Ambulatory Visit

## 2024-06-16 ENCOUNTER — Ambulatory Visit: Admitting: Sports Medicine

## 2024-06-16 DIAGNOSIS — M5416 Radiculopathy, lumbar region: Secondary | ICD-10-CM

## 2024-06-16 MED ORDER — PREDNISONE 50 MG PO TABS
ORAL_TABLET | ORAL | 0 refills | Status: DC
Start: 2024-06-16 — End: 2024-06-23

## 2024-06-16 NOTE — Progress Notes (Signed)
    Procedures performed today:    None.  Independent interpretation of notes and tests performed by another provider:   I did personally review a CT scan of the abdomen and pelvis stone protocol from about 2 to 3 years ago, I do see some disc protrusions L4-L5 and L5-S1.  Brief History, Exam, Impression, and Recommendations:    Left lumbar radiculitis This is a very pleasant 48 year old female, she has had about 3 months of increasing pain axial low back worse with sitting, flexion, Valsalva, radiation down the left leg in an S1 distribution. No bowel or bladder dysfunction, saddle numbness, constitutional symptoms. On exam she has normal strength, sensation, she does have a positive straight leg raise with reproduction of left S1 radicular symptoms. We discussed anatomy and pathophysiology of lumbar radiculitis, adding 5 days of prednisone , x-rays, home physical therapy. Return to see me in 6 weeks, MR for interventional planning if not better.    ____________________________________________ Debby PARAS. Curtis, M.D., ABFM., CAQSM., AME. Primary Care and Sports Medicine [facility]  Adjunct Professor of [facility] Medicine  [employer]  Restaurant manager, fast food

## 2024-06-16 NOTE — Assessment & Plan Note (Signed)
 This is a very pleasant 48 year old female, she has had about 3 months of increasing pain axial low back worse with sitting, flexion, Valsalva, radiation down the left leg in an S1 distribution. No bowel or bladder dysfunction, saddle numbness, constitutional symptoms. On exam she has normal strength, sensation, she does have a positive straight leg raise with reproduction of left S1 radicular symptoms. We discussed anatomy and pathophysiology of lumbar radiculitis, adding 5 days of prednisone , x-rays, home physical therapy. Return to see me in 6 weeks, MR for interventional planning if not better.

## 2024-06-20 ENCOUNTER — Other Ambulatory Visit: Payer: Self-pay | Admitting: Medical-Surgical

## 2024-06-23 ENCOUNTER — Ambulatory Visit
Admission: EM | Admit: 2024-06-23 | Discharge: 2024-06-23 | Disposition: A | Discharge status: Home / Self Care | Source: Ambulatory Visit | Attending: Family Medicine | Admitting: Family Medicine

## 2024-06-23 DIAGNOSIS — J329 Chronic sinusitis, unspecified: Secondary | ICD-10-CM

## 2024-06-23 DIAGNOSIS — J3489 Other specified disorders of nose and nasal sinuses: Secondary | ICD-10-CM

## 2024-06-23 MED ORDER — AMOXICILLIN-POT CLAVULANATE 875-125 MG PO TABS: 1.0000 | ORAL_TABLET | Freq: Two times a day (BID) | ORAL | 0 refills | Status: DC

## 2024-06-23 MED ORDER — METHYLPREDNISOLONE ACETATE 80 MG/ML IJ SUSP: 80.0000 mg | Freq: Once | INTRAMUSCULAR | Status: DC

## 2024-06-23 NOTE — ED Triage Notes (Signed)
 Pt presents to [facility] with co sinus pressure, otalgia since Saturday. Pt concerned for sinus infection.

## 2024-06-23 NOTE — ED Provider Notes (Signed)
 [name]    CSN: [id number] Arrival date & time: 06/23/24  1512      History   Chief Complaint Chief Complaint  Patient presents with   Facial Pain   Otalgia    HPI Mary Robles is a 48 y.o. female.   HPI 48 year old female presents with sinus pressure and otalgia for 4 days.  PMH significant for fibromyalgia, acute recurrent pansinusitis, and HTN.  Past Medical History:  Diagnosis Date   Allergy    Angio-edema    Anxiety    Asthma    no symptoms in years, no inhaler   Depression    Fibromyalgia 2021   GERD (gastroesophageal reflux disease)    Herpes simplex    Hiatal hernia    Hyperlipidemia due to dietary fat intake    Hyperlysinemia (HCC)    Hypertension    Hypokalemia    Lipoma of abdominal wall s/p excision 05/04/2014 05/19/2014   Sickle cell trait (HCC)    Status post total abdominal hysterectomy 05/04/2014   Vitamin D  deficiency     Patient Active Problem List   Diagnosis Date Noted   Left lumbar radiculitis 06/16/2024   Prediabetes 01/10/2024   Radiculitis of right cervical region 02/19/2023   Chronic sinusitis 05/25/2022   Plantar fasciitis, bilateral 04/05/2022   Fibromyalgia 02/16/2020   Cough    Gastroesophageal reflux disease    Heartburn    Acute recurrent pansinusitis 04/29/2019   Chest pain with low risk for cardiac etiology 09/17/2018   DOE (dyspnea on exertion) 09/17/2018   Seasonal allergic rhinitis 03/14/2018   Obesity (BMI 30.0-34.9) 07/02/2017   Deviated septum 01/22/2017   Nasal turbinate hypertrophy 01/22/2017   MDD (major depressive disorder) 07/16/2016   Asthma, mild 04/11/2015   GAD (generalized anxiety disorder) 02/08/2015   Hyperlipidemia 07/09/2014   Insomnia 07/09/2014   Hypertension    Vitamin D  deficiency     Past Surgical History:  Procedure Laterality Date   60 HOUR PH STUDY N/A 07/08/2019   Procedure: 24 HOUR PH STUDY;  Surgeon: Eda Iha, MD;  Location: [facility];  Service:  Gastroenterology;  Laterality: N/A;   ABDOMINAL HYSTERECTOMY N/A 05/04/2014   Procedure: HYSTERECTOMY ABDOMINAL with BILATERAL SALPINGECTOMY ;  Surgeon: Bobie FORBES Crown de Charlynn FORBES Cary, MD;  Location: [facility];  Service: Gynecology;  Laterality: N/A;   BREAST BIOPSY Right 12/2014   benign fibroadenoma   ESOPHAGEAL MANOMETRY N/A 07/08/2019   Procedure: ESOPHAGEAL MANOMETRY (EM);  Surgeon: Eda Iha, MD;  Location: [facility];  Service: Gastroenterology;  Laterality: N/A;   FOOT SURGERY     LAPAROTOMY N/A 05/04/2014   Procedure: REMOVAL OF ABD WALL MASS ;  Surgeon: Elspeth KYM Schultze, MD;  Location: [facility];  Service: General;  Laterality: N/A;   LIPOMA EXCISION N/A 05/04/2014   Procedure: EXCISION LIPOMA of abdominal wall;  Surgeon: Bobie FORBES Crown de Charlynn FORBES Cary, MD;  Location: [facility];  Service: Gynecology;  Laterality: N/A;   NASAL SINUS SURGERY  2019   PELVIC LAPAROSCOPY Right 2018   oophorectomy   TUBAL LIGATION     UPPER GASTROINTESTINAL ENDOSCOPY      OB History     Gravida  4   Para  2   Term  2   Preterm      AB  2   Living  2      SAB  1   IAB  1   Ectopic      Multiple  Live Births               Home Medications    Prior to Admission medications   Medication Sig Start Date End Date Taking? Authorizing Provider  amoxicillin -clavulanate (AUGMENTIN ) 875-125 MG tablet Take 1 tablet by mouth every 12 (twelve) hours. 06/23/24  Yes Teddy Sharper, FNP  atorvastatin  (LIPITOR) 10 MG tablet Take 1 tablet by mouth once daily 05/01/24   Jessup, Joy, NP  cetirizine  (ZYRTEC ) 10 MG tablet Take 1 tablet by mouth once daily 06/16/24   Jessup, Joy, NP  cholecalciferol (VITAMIN D3) 25 MCG (1000 UNIT) tablet Take 1,000 Units by mouth daily.    [provider]  cloNIDine  (CATAPRES ) 0.1 MG tablet Take 1 tablet by mouth twice daily 06/04/24   Willo Mini, NP  DULoxetine  (CYMBALTA ) 30 MG capsule Take 1 capsule by mouth once daily 06/22/24   Jessup,  Joy, NP  EPINEPHrine  (EPIPEN  2-PAK) 0.3 mg/0.3 mL IJ SOAJ injection Inject 0.3 mg into the muscle once as needed (for an anaphylactic reaction). 05/27/23   Willo Mini, NP  meloxicam  (MOBIC ) 15 MG tablet One tab PO every 24 hours with a meal for 2 weeks, then once every 24 hours prn pain. 01/10/24   Willo Mini, NP  montelukast  (SINGULAIR ) 10 MG tablet TAKE 1 TABLET BY MOUTH ONCE DAILY AT BEDTIME 06/16/24   Willo Mini, NP  pantoprazole  (PROTONIX ) 20 MG tablet Take 1 tablet by mouth once daily 06/16/24   Jessup, Joy, NP  valsartan -hydrochlorothiazide  (DIOVAN -HCT) 320-25 MG tablet Take 1 tablet by mouth daily. 01/10/24   Willo Mini, NP    Family History Family History  Problem Relation Age of Onset   Diabetes Mother    Thyroid disease Mother    Colon polyps Mother    Stroke Father    Mental retardation Maternal Aunt    Diabetes Maternal Grandmother    Colon cancer Neg Hx    Esophageal cancer Neg Hx    Stomach cancer Neg Hx    Rectal cancer Neg Hx     Social History Social History   Tobacco Use   Smoking status: Never   Smokeless tobacco: Never  Vaping Use   Vaping status: Never Used  Substance Use Topics   Alcohol use: Not Currently    Alcohol/week: 0.0 standard drinks of alcohol   Drug use: Never     Allergies   Aldactone  [spironolactone ], Benazepril , Betadine [povidone iodine], Calcitonin (salmon), Other, Vicodin [hydrocodone -acetaminophen ], and Shellfish allergy   Review of Systems Review of Systems  HENT:  Positive for sinus pressure and sinus pain.      Physical Exam Triage Vital Signs ED Triage Vitals  Encounter Vitals Group     BP      Girls Systolic BP Percentile      Girls Diastolic BP Percentile      Boys Systolic BP Percentile      Boys Diastolic BP Percentile      Pulse      Resp      Temp      Temp src      SpO2      Weight      Height      Head Circumference      Peak Flow      Pain Score      Pain Loc      Pain Education      Exclude from  Growth Chart    No data found.  Updated Vital Signs BP ROLLEN)  139/94   Pulse 85   Temp 99.1 F (37.3 C)   Resp 19   LMP 04/17/2014 Comment: hysterectomy w/ single oophorectomy//a.c.  SpO2 98%    Physical Exam Vitals and nursing note reviewed.  Constitutional:      Appearance: Normal appearance. She is obese.  HENT:     Head: Normocephalic and atraumatic.     Right Ear: Tympanic membrane and external ear normal.     Left Ear: Tympanic membrane and external ear normal.     Ears:     Comments: Significant eustachian tube dysfunction noted bilaterally    Nose:     Right Sinus: Maxillary sinus tenderness present.     Left Sinus: Maxillary sinus tenderness present.     Comments: Turbinates are erythematous/edematous    Mouth/Throat:     Mouth: Mucous membranes are moist.     Pharynx: Oropharynx is clear. Uvula midline.  Eyes:     Extraocular Movements: Extraocular movements intact.     Conjunctiva/sclera: Conjunctivae normal.     Pupils: Pupils are equal, round, and reactive to light.  Cardiovascular:     Rate and Rhythm: Normal rate and regular rhythm.     Pulses: Normal pulses.     Heart sounds: Normal heart sounds.  Pulmonary:     Effort: Pulmonary effort is normal.     Breath sounds: Normal breath sounds. No wheezing, rhonchi or rales.  Musculoskeletal:        General: Normal range of motion.     Cervical back: Normal range of motion and neck supple.  Skin:    General: Skin is warm and dry.  Neurological:     General: No focal deficit present.     Mental Status: She is alert and oriented to person, place, and time. Mental status is at baseline.  Psychiatric:        Mood and Affect: Mood normal.        Behavior: Behavior normal.      UC Treatments / Results  Labs (all labs ordered are listed, but only abnormal results are displayed) Labs Reviewed - No data to display  EKG   Radiology No results found.  Procedures Procedures (including critical care  time)  Medications Ordered in UC Medications  methylPREDNISolone  acetate (DEPO-MEDROL ) injection 80 mg (has no administration in time range)    Initial Impression / Assessment and Plan / UC Course  I have reviewed the triage vital signs and the nursing notes.  Pertinent labs & imaging results that were available during my care of the patient were reviewed by me and considered in my medical decision making (see chart for details).     MDM: 1.  Chronic sinusitis, unspecified location-Rx'd Augmentin  875/125 mg tablet: Take 1 tablet twice daily x 7 days; 2.  Sinus pressure-IM Depo-Medrol  80 mg given once in clinic and prior to discharge. Advised patient to take medication as directed with food to completion.  Encouraged to increase daily water intake to 64 ounces per day while taking his medications.  Advised if symptoms worsen and/or unresolved please follow-up with your PCP or here for further evaluation.  Patient discharged home, hemodynamically stable. Final Clinical Impressions(s) / UC Diagnoses   Final diagnoses:  Chronic sinusitis, unspecified location  Sinus pressure     Discharge Instructions      Advised patient to take medication as directed with food to completion.  Encouraged to increase daily water intake to 64 ounces per day while taking his medications.  Advised  if symptoms worsen and/or unresolved please follow-up with your PCP or here for further evaluation.     ED Prescriptions     Medication Sig Dispense Auth. Provider   amoxicillin -clavulanate (AUGMENTIN ) 875-125 MG tablet Take 1 tablet by mouth every 12 (twelve) hours. 14 tablet Mayda Shippee, FNP      PDMP not reviewed this encounter.   Teddy Sharper, FNP 06/23/24 [phone]

## 2024-06-23 NOTE — Discharge Instructions (Addendum)
 Advised patient to take medication as directed with food to completion.  Encouraged to increase daily water intake to 64 ounces per day while taking his medications.  Advised if symptoms worsen and/or unresolved please follow-up with your PCP or here for further evaluation.

## 2024-06-30 ENCOUNTER — Other Ambulatory Visit: Payer: Self-pay | Admitting: Medical-Surgical

## 2024-07-10 ENCOUNTER — Ambulatory Visit: Payer: 59 | Admitting: Medical-Surgical

## 2024-07-10 ENCOUNTER — Other Ambulatory Visit: Payer: Self-pay | Admitting: Medical-Surgical

## 2024-07-10 ENCOUNTER — Encounter: Payer: Self-pay | Admitting: Medical-Surgical

## 2024-07-10 VITALS — BP 148/92 | HR 87 | Resp 20 | Ht 66.0 in | Wt 201.0 lb

## 2024-07-10 DIAGNOSIS — R632 Polyphagia: Secondary | ICD-10-CM

## 2024-07-10 DIAGNOSIS — J4520 Mild intermittent asthma, uncomplicated: Secondary | ICD-10-CM

## 2024-07-10 DIAGNOSIS — E782 Mixed hyperlipidemia: Secondary | ICD-10-CM | POA: Diagnosis not present

## 2024-07-10 DIAGNOSIS — E559 Vitamin D deficiency, unspecified: Secondary | ICD-10-CM

## 2024-07-10 DIAGNOSIS — I10 Essential (primary) hypertension: Secondary | ICD-10-CM

## 2024-07-10 DIAGNOSIS — M5412 Radiculopathy, cervical region: Secondary | ICD-10-CM

## 2024-07-10 DIAGNOSIS — R7303 Prediabetes: Secondary | ICD-10-CM

## 2024-07-10 DIAGNOSIS — E66811 Obesity, class 1: Secondary | ICD-10-CM

## 2024-07-10 DIAGNOSIS — F411 Generalized anxiety disorder: Secondary | ICD-10-CM

## 2024-07-10 DIAGNOSIS — Z91013 Allergy to seafood: Secondary | ICD-10-CM

## 2024-07-10 DIAGNOSIS — F322 Major depressive disorder, single episode, severe without psychotic features: Secondary | ICD-10-CM

## 2024-07-10 DIAGNOSIS — J302 Other seasonal allergic rhinitis: Secondary | ICD-10-CM

## 2024-07-10 LAB — POCT GLYCOSYLATED HEMOGLOBIN (HGB A1C): Hemoglobin A1C: 5.7 % — AB (ref 4.0–5.6)

## 2024-07-10 MED ORDER — MELOXICAM 15 MG PO TABS
ORAL_TABLET | ORAL | 3 refills | Status: DC
Start: 2024-07-10 — End: 2024-07-28

## 2024-07-10 MED ORDER — MONTELUKAST SODIUM 10 MG PO TABS
10.0000 mg | ORAL_TABLET | Freq: Every day | ORAL | 0 refills | Status: DC
Start: 2024-07-10 — End: 2024-09-14

## 2024-07-10 MED ORDER — ALBUTEROL SULFATE HFA 108 (90 BASE) MCG/ACT IN AERS: 2.0000 | INHALATION_SPRAY | Freq: Four times a day (QID) | RESPIRATORY_TRACT | 0 refills | Status: AC | PRN

## 2024-07-10 MED ORDER — ATORVASTATIN CALCIUM 10 MG PO TABS
10.0000 mg | ORAL_TABLET | Freq: Every day | ORAL | 0 refills | Status: DC
Start: 2024-07-10 — End: 2024-11-04

## 2024-07-10 MED ORDER — DULOXETINE HCL 30 MG PO CPEP
30.0000 mg | ORAL_CAPSULE | Freq: Every day | ORAL | 0 refills | Status: DC
Start: 2024-07-10 — End: 2024-08-19

## 2024-07-10 MED ORDER — CETIRIZINE HCL 10 MG PO TABS: 10.0000 mg | ORAL_TABLET | Freq: Every day | ORAL | 0 refills | Status: DC

## 2024-07-10 MED ORDER — CLONIDINE HCL 0.1 MG PO TABS: 0.1000 mg | ORAL_TABLET | Freq: Two times a day (BID) | ORAL | 0 refills | Status: DC

## 2024-07-10 MED ORDER — PANTOPRAZOLE SODIUM 20 MG PO TBEC: 20.0000 mg | DELAYED_RELEASE_TABLET | Freq: Every day | ORAL | 0 refills | Status: DC

## 2024-07-10 MED ORDER — VALSARTAN-HYDROCHLOROTHIAZIDE 320-25 MG PO TABS: 1.0000 | ORAL_TABLET | Freq: Every day | ORAL | 11 refills | Status: AC

## 2024-07-10 MED ORDER — EPINEPHRINE 0.3 MG/0.3ML IJ SOAJ: 0.3000 mg | Freq: Once | INTRAMUSCULAR | 0 refills | Status: AC | PRN

## 2024-07-10 NOTE — Progress Notes (Signed)
 Established patient visit  Discussed the use of AI scribe software for clinical note transcription with the patient, who gave verbal consent to proceed.  History of Present Illness   Mary Robles is a 48 year old female who presents for follow-up regarding her blood sugar levels and medication management.  Glycemic control and hypoglycemic symptoms - Hemoglobin A1c 5.6% (January) . - Experiences excessive hunger approximately two hours after eating. - Occasional tremors if she does not eat. - Possibly not consuming enough protein throughout the day.  Dyslipidemia and statin therapy - Currently taking Lipitor. - Inquires about discontinuing Lipitor.  Respiratory symptoms and asthma management - Wheezing present. - Albuterol  inhaler expired in 2023.  Sinus congestion - Sinus issues ongoing for the past four days. - Chronic sinusitis, daily meloxicam  helps.  Sleep-disordered breathing - Partner observes episodes of apnea during sleep. - Awaiting response from sleep study referral.  Peripheral edema - Swelling in ankles after recent trip to [facility]. - Wore compression hose on the plane.      Physical Exam Vitals reviewed.  Constitutional:      General: She is not in acute distress.    Appearance: Normal appearance.  HENT:     Head: Normocephalic and atraumatic.  Cardiovascular:     Rate and Rhythm: Normal rate and regular rhythm.     Pulses: Normal pulses.     Heart sounds: Normal heart sounds. No murmur heard.    No friction rub. No gallop.  Pulmonary:     Effort: Pulmonary effort is normal. No respiratory distress.     Breath sounds: Normal breath sounds. No wheezing.  Skin:    General: Skin is warm and dry.  Neurological:     Mental Status: She is alert and oriented to person, place, and time.  Psychiatric:        Mood and Affect: Mood normal.        Behavior: Behavior normal.        Thought Content: Thought content normal.        Judgment: Judgment  normal.    Assessment and Plan    Prediabetes A1c increased to 5.7, indicating well-managed prediabetes. Episodes of excessive hunger and shakiness likely due to low protein intake. - Increase dietary protein intake. - Order blood work to monitor A1c and other parameters.  Hyperlipidemia Continued Lipitor due to elevated cardiovascular risk and significant cholesterol increase upon discontinuation. - Continue Lipitor. - Order blood work to monitor cholesterol levels.  Hypertension On antihypertensive medication with home monitoring. - Recheck blood pressure during visit.  Obesity BMI 32.44. Discussed weight loss options and insurance coverage for medications. - Consider Wegovy or Zepbound if insurance covers. - Encourage whole-body exercise.  Asthma Reports wheezing with expired albuterol  inhaler. - Prescribe new albuterol  inhaler.  Chronic Sinusitis Sinus issues with ear pain and throat discomfort possibly related to chronic sinusitis. - Exam benign.  - Continue meloxicam  and Zyrtec .  Sleep Apnea Awaiting follow-up for sleep study referral. - Follow up with [facility] for sleep study referral.   Depression/anxiety Stable on Cymbalta  without SI/HI. - Continue Cymbalta .   Shellfish allergy Has an epi-pen but has not had to use it. - Refilling epi-pen.   Vitamin D  deficiency History of vitamin D  deficiency, not currently taking a supplement.  - Checking vitamin D  today.   Polyphagia Unclear etiology. Consider dietary modification or possible thyroid dysfunction. - Checking TSH and free T4  General Health Maintenance Due  for routine blood work. Inconsistent vitamin intake. - Order routine blood work including cholesterol and vitamin D . - Encourage consistent vitamin supplementation.     Return in about 6 months (around 01/10/2025) for chronic disease follow up.  __________________________________ Zada FREDRIK Palin, DNP, APRN, FNP-BC Primary Care and  Sports Medicine [facility] [place]

## 2024-07-11 ENCOUNTER — Other Ambulatory Visit: Payer: Self-pay | Admitting: Medical-Surgical

## 2024-07-11 ENCOUNTER — Ambulatory Visit: Payer: Self-pay | Admitting: Medical-Surgical

## 2024-07-11 ENCOUNTER — Encounter: Payer: Self-pay | Admitting: Medical-Surgical

## 2024-07-11 LAB — LIPID PANEL
Chol/HDL Ratio: 3.2 ratio (ref 0.0–4.4)
Cholesterol, Total: 133 mg/dL (ref 100–199)
HDL: 41 mg/dL (ref >39)
LDL Chol Calc (NIH): 77 mg/dL (ref 0–99)
Triglycerides: 73 mg/dL (ref 0–149)
VLDL Cholesterol Cal: 15 mg/dL (ref 5–40)

## 2024-07-11 LAB — CMP14+EGFR
ALT: 13 IU/L (ref 0–32)
AST: 21 IU/L (ref 0–40)
Albumin: 4.1 g/dL (ref 3.9–4.9)
Alkaline Phosphatase: 62 IU/L (ref 44–121)
BUN/Creatinine Ratio: 12 (ref 9–23)
BUN: 11 mg/dL (ref 6–24)
Bilirubin Total: 0.5 mg/dL (ref 0.0–1.2)
CO2: 19 mmol/L — ABNORMAL LOW (ref 20–29)
Calcium: 9.1 mg/dL (ref 8.7–10.2)
Chloride: 100 mmol/L (ref 96–106)
Creatinine, Ser: 0.94 mg/dL (ref 0.57–1.00)
Globulin, Total: 2.6 g/dL (ref 1.5–4.5)
Glucose: 80 mg/dL (ref 70–99)
Potassium: 3.7 mmol/L (ref 3.5–5.2)
Sodium: 139 mmol/L (ref 134–144)
Total Protein: 6.7 g/dL (ref 6.0–8.5)
eGFR: 75 mL/min/1.73 (ref >59)

## 2024-07-11 LAB — CBC
Hematocrit: 42.4 % (ref 34.0–46.6)
Hemoglobin: 13.4 g/dL (ref 11.1–15.9)
MCH: 26.6 pg (ref 26.6–33.0)
MCHC: 31.6 g/dL (ref 31.5–35.7)
MCV: 84 fL (ref 79–97)
Platelets: 371 x10E3/uL (ref 150–450)
RBC: 5.03 x10E6/uL (ref 3.77–5.28)
RDW: 15.6 % — ABNORMAL HIGH (ref 11.7–15.4)
WBC: 8.6 x10E3/uL (ref 3.4–10.8)

## 2024-07-11 LAB — VITAMIN D 25 HYDROXY (VIT D DEFICIENCY, FRACTURES): Vit D, 25-Hydroxy: 26.8 ng/mL — ABNORMAL LOW (ref 30.0–100.0)

## 2024-07-11 LAB — TSH: TSH: 2.83 u[IU]/mL (ref 0.450–4.500)

## 2024-07-11 LAB — T4, FREE: Free T4: 1 ng/dL (ref 0.82–1.77)

## 2024-07-13 NOTE — Progress Notes (Signed)
 It has been faxed.

## 2024-07-28 ENCOUNTER — Ambulatory Visit: Admitting: Sports Medicine

## 2024-07-28 DIAGNOSIS — M5416 Radiculopathy, lumbar region: Secondary | ICD-10-CM | POA: Diagnosis not present

## 2024-07-28 MED ORDER — GABAPENTIN 300 MG PO CAPS
ORAL_CAPSULE | ORAL | 3 refills | Status: DC
Start: 2024-07-28 — End: 2024-09-28

## 2024-07-28 NOTE — Progress Notes (Signed)
    Procedures performed today:    None.  Independent interpretation of notes and tests performed by another provider:   None.  Brief History, Exam, Impression, and Recommendations:    Left lumbar radiculitis Very pleasant 48 year old female, several months of increasing pain axial low back worse with sitting, flexion, Valsalva radiating down left leg in an S1 distribution. At this point she has failed over 6 weeks of physical therapy, medications. X-rays show degenerative processes.   Gabapentin . Proceeding with MRI for epidural planning, likely left S1 selective. Return to go over MRI results.    ____________________________________________ Mary Robles, M.D., ABFM., CAQSM., AME. Primary Care and Sports Medicine [facility]  Adjunct Professor of [facility] Medicine  [employer]  Restaurant manager, fast food

## 2024-07-28 NOTE — Assessment & Plan Note (Signed)
 Very pleasant 48 year old female, several months of increasing pain axial low back worse with sitting, flexion, Valsalva radiating down left leg in an S1 distribution. At this point she has failed over 6 weeks of physical therapy, medications. X-rays show degenerative processes.   Gabapentin . Proceeding with MRI for epidural planning, likely left S1 selective. Return to go over MRI results.

## 2024-08-01 ENCOUNTER — Ambulatory Visit

## 2024-08-01 DIAGNOSIS — M5416 Radiculopathy, lumbar region: Secondary | ICD-10-CM | POA: Diagnosis not present

## 2024-08-04 ENCOUNTER — Other Ambulatory Visit: Payer: Self-pay | Admitting: Medical-Surgical

## 2024-08-15 ENCOUNTER — Other Ambulatory Visit: Payer: Self-pay | Admitting: Medical-Surgical

## 2024-08-15 DIAGNOSIS — F322 Major depressive disorder, single episode, severe without psychotic features: Secondary | ICD-10-CM

## 2024-08-15 DIAGNOSIS — F411 Generalized anxiety disorder: Secondary | ICD-10-CM

## 2024-08-18 ENCOUNTER — Encounter: Payer: Self-pay | Admitting: Sports Medicine

## 2024-08-22 ENCOUNTER — Other Ambulatory Visit: Payer: Self-pay | Admitting: Medical-Surgical

## 2024-08-31 ENCOUNTER — Other Ambulatory Visit: Payer: Self-pay | Admitting: Medical-Surgical

## 2024-09-11 ENCOUNTER — Ambulatory Visit
Admission: EM | Admit: 2024-09-11 | Discharge: 2024-09-11 | Disposition: A | Discharge status: Home / Self Care | Attending: Family Medicine | Admitting: Family Medicine

## 2024-09-11 ENCOUNTER — Encounter: Payer: Self-pay | Admitting: Emergency Medicine

## 2024-09-11 DIAGNOSIS — J0111 Acute recurrent frontal sinusitis: Secondary | ICD-10-CM

## 2024-09-11 DIAGNOSIS — J0101 Acute recurrent maxillary sinusitis: Secondary | ICD-10-CM

## 2024-09-11 MED ORDER — METHYLPREDNISOLONE SODIUM SUCC 125 MG IJ SOLR
80.0000 mg | Freq: Once | INTRAMUSCULAR | Status: AC
  Administered 2024-09-11: 80 mg via INTRAMUSCULAR

## 2024-09-11 MED ORDER — PREDNISONE 20 MG PO TABS
ORAL_TABLET | ORAL | 0 refills | Status: DC
Start: 2024-09-11 — End: 2024-09-18

## 2024-09-11 MED ORDER — AMOXICILLIN-POT CLAVULANATE 875-125 MG PO TABS: ORAL_TABLET | ORAL | 0 refills | Status: DC

## 2024-09-11 NOTE — Discharge Instructions (Addendum)
 Begin prednisone  Saturday 09/12/24. Continue saline nasal irrigation. Try warm salt water gargles for sore throat.

## 2024-09-11 NOTE — ED Triage Notes (Signed)
 Patient c/o sinus HA x 3 days, gunk in eyes in the mornings.  Sinus problems for several weeks.  Afebrile.  Patient has been taken Ibuprofen , Breathe Easy and drinking a herbal tea.

## 2024-09-11 NOTE — ED Provider Notes (Signed)
 Mary Robles    CSN: [id number] Arrival date & time: 09/11/24  1312      History   Chief Complaint Chief Complaint  Patient presents with   Sinus Headache    HPI Mary Robles is a 48 y.o. female.   Patient complains of sinus congestion for several weeks.  During the past 3 days she has had facial pressure, frontal headache, and drainage from her eyes each morning. She denies cough and fever.  Her symptoms improve somewhat with herbal tea and ibuprofen .  The history is provided by the patient.    Past Medical History:  Diagnosis Date   Allergy    Angio-edema    Anxiety    Asthma    no symptoms in years, no inhaler   Depression    Fibromyalgia 2021   GERD (gastroesophageal reflux disease)    Herpes simplex    Hiatal hernia    Hyperlipidemia due to dietary fat intake    Hyperlysinemia    Hypertension    Hypokalemia    Lipoma of abdominal wall s/p excision 05/04/2014 05/19/2014   Sickle cell trait    Status post total abdominal hysterectomy 05/04/2014   Vitamin D  deficiency     Patient Active Problem List   Diagnosis Date Noted   Shellfish allergy 07/10/2024   Left lumbar radiculitis 06/16/2024   Prediabetes 01/10/2024   Radiculitis of right cervical region 02/19/2023   Plantar fasciitis, bilateral 04/05/2022   Fibromyalgia 02/16/2020   Gastroesophageal reflux disease    Chronic sinusitis 04/29/2019   Seasonal allergic rhinitis 03/14/2018   Obesity (BMI 30.0-34.9) 07/02/2017   Deviated septum 01/22/2017   Nasal turbinate hypertrophy 01/22/2017   MDD (major depressive disorder) 07/16/2016   Asthma, mild 04/11/2015   GAD (generalized anxiety disorder) 02/08/2015   Hyperlipidemia 07/09/2014   Insomnia 07/09/2014   Hypertension    Vitamin D  deficiency     Past Surgical History:  Procedure Laterality Date   28 HOUR PH STUDY N/A 07/08/2019   Procedure: 24 HOUR PH STUDY;  Surgeon: Eda Iha, MD;  Location: [facility];  Service:  Gastroenterology;  Laterality: N/A;   ABDOMINAL HYSTERECTOMY N/A 05/04/2014   Procedure: HYSTERECTOMY ABDOMINAL with BILATERAL SALPINGECTOMY ;  Surgeon: Bobie FORBES Crown de Charlynn FORBES Cary, MD;  Location: [facility];  Service: Gynecology;  Laterality: N/A;   BREAST BIOPSY Right 12/2014   benign fibroadenoma   ESOPHAGEAL MANOMETRY N/A 07/08/2019   Procedure: ESOPHAGEAL MANOMETRY (EM);  Surgeon: Eda Iha, MD;  Location: [facility];  Service: Gastroenterology;  Laterality: N/A;   FOOT SURGERY     LAPAROTOMY N/A 05/04/2014   Procedure: REMOVAL OF ABD WALL MASS ;  Surgeon: Elspeth KYM Schultze, MD;  Location: [facility];  Service: General;  Laterality: N/A;   LIPOMA EXCISION N/A 05/04/2014   Procedure: EXCISION LIPOMA of abdominal wall;  Surgeon: Bobie FORBES Crown de Charlynn FORBES Cary, MD;  Location: [facility];  Service: Gynecology;  Laterality: N/A;   NASAL SINUS SURGERY  2019   PELVIC LAPAROSCOPY Right 2018   oophorectomy   TUBAL LIGATION     UPPER GASTROINTESTINAL ENDOSCOPY      OB History     Gravida  4   Para  2   Term  2   Preterm      AB  2   Living  2      SAB  1   IAB  1   Ectopic      Multiple  Live Births               Home Medications    Prior to Admission medications   Medication Sig Start Date End Date Taking? Authorizing Provider  albuterol  (VENTOLIN  HFA) 108 (90 Base) MCG/ACT inhaler Inhale 2 puffs into the lungs every 6 (six) hours as needed for wheezing or shortness of breath. 07/10/24  Yes Willo Mini, NP  amoxicillin -clavulanate (AUGMENTIN ) 875-125 MG tablet Take one tab PO Q12hr with food 09/11/24  Yes Konya Fauble, Garnette LABOR, MD  atorvastatin  (LIPITOR) 10 MG tablet Take 1 tablet (10 mg total) by mouth daily. 07/10/24  Yes Willo Mini, NP  cetirizine  (ZYRTEC ) 10 MG tablet Take 1 tablet by mouth once daily 08/19/24  Yes Jessup, Joy, NP  cloNIDine  (CATAPRES ) 0.1 MG tablet Take 1 tablet by mouth twice daily 08/24/24  Yes Willo Mini, NP  DULoxetine  (CYMBALTA )  30 MG capsule Take 1 capsule by mouth once daily 08/19/24  Yes Willo Mini, NP  EPINEPHrine  (EPIPEN  2-PAK) 0.3 mg/0.3 mL IJ SOAJ injection Inject 0.3 mg into the muscle once as needed (for an anaphylactic reaction). 07/10/24  Yes Willo Mini, NP  montelukast  (SINGULAIR ) 10 MG tablet Take 1 tablet (10 mg total) by mouth at bedtime. 07/10/24  Yes Willo Mini, NP  pantoprazole  (PROTONIX ) 20 MG tablet Take 1 tablet by mouth once daily 09/02/24  Yes Jessup, Joy, NP  predniSONE  (DELTASONE ) 20 MG tablet Take one tab by mouth twice daily for 4 days, then one daily for 3 days. Take with food. 09/11/24  Yes Pauline Garnette LABOR, MD  valsartan -hydrochlorothiazide  (DIOVAN -HCT) 320-25 MG tablet Take 1 tablet by mouth daily. 07/10/24  Yes Willo Mini, NP  gabapentin  (NEURONTIN ) 300 MG capsule One tab PO qHS for a week, then BID for a week, then TID. May double weekly to a max of 3,600mg /day 07/28/24   Curtis Debby PARAS, MD    Family History Family History  Problem Relation Age of Onset   Diabetes Mother    Thyroid disease Mother    Colon polyps Mother    Stroke Father    Mental retardation Maternal Aunt    Diabetes Maternal Grandmother    Colon cancer Neg Hx    Esophageal cancer Neg Hx    Stomach cancer Neg Hx    Rectal cancer Neg Hx     Social History Social History   Tobacco Use   Smoking status: Never   Smokeless tobacco: Never  Vaping Use   Vaping status: Never Used  Substance Use Topics   Alcohol use: Not Currently    Alcohol/week: 0.0 standard drinks of alcohol   Drug use: Never     Allergies   Aldactone  [spironolactone ], Benazepril , Betadine [povidone iodine], Calcitonin (salmon), Other, Vicodin [hydrocodone -acetaminophen ], and Shellfish allergy   Review of Systems Review of Systems No sore throat No cough No pleuritic pain No wheezing + nasal congestion + post-nasal drainage + sinus pain/pressure No itchy/red eyes No earache No hemoptysis No SOB No fever/chills No  nausea No vomiting No abdominal pain No diarrhea No urinary symptoms No skin rash No fatigue No myalgias No headache   Physical Exam Triage Vital Signs ED Triage Vitals  Encounter Vitals Group     BP 09/11/24 1327 (!) 144/98     Girls Systolic BP Percentile --      Girls Diastolic BP Percentile --      Boys Systolic BP Percentile --      Boys Diastolic BP Percentile --  Pulse Rate 09/11/24 1327 81     Resp 09/11/24 1327 18     Temp 09/11/24 1327 98.9 F (37.2 C)     Temp Source 09/11/24 1327 Oral     SpO2 09/11/24 1327 98 %     Weight 09/11/24 1328 200 lb (90.7 kg)     Height 09/11/24 1328 5' 6 (1.676 m)     Head Circumference --      Peak Flow --      Pain Score 09/11/24 1328 8     Pain Loc --      Pain Education --      Exclude from Growth Chart --    No data found.  Updated Vital Signs BP (!) 144/98 (BP Location: Right Arm)   Pulse 81   Temp 98.9 F (37.2 C) (Oral)   Resp 18   Ht 5' 6 (1.676 m)   Wt 90.7 kg   LMP 04/17/2014 Comment: hysterectomy w/ single oophorectomy//a.c.  SpO2 98%   BMI 32.28 kg/m   Visual Acuity Right Eye Distance:   Left Eye Distance:   Bilateral Distance:    Right Eye Near:   Left Eye Near:    Bilateral Near:     Physical Exam Nursing notes and Vital Signs reviewed. Appearance:  Patient appears stated age, and in no acute distress Eyes:  Pupils are equal, round, and reactive to light and accomodation.  Extraocular movement is intact.  Conjunctivae are not inflamed  Ears:  Canals normal.  Tympanic membranes normal.  Nose:  Congested turbinates.  Maxillary and frontal sinus tenderness is present.  Pharynx:  Normal Neck:  Supple.  No adenopathy.  Lungs:  Clear to auscultation.  Breath sounds are equal.  Moving air well. Heart:  Regular rate and rhythm without murmurs, rubs, or gallops.  Abdomen:  Nontender without masses or hepatosplenomegaly.  Bowel sounds are present.  No CVA or flank tenderness.  Extremities:  No  edema.  Skin:  No rash present.   UC Treatments / Results  Labs (all labs ordered are listed, but only abnormal results are displayed) Labs Reviewed - No data to display  EKG   Radiology No results found.  Procedures Procedures (including critical Robles time)  Medications Ordered in UC Medications  methylPREDNISolone  sodium succinate (SOLU-MEDROL ) 125 mg/2 mL injection 80 mg (80 mg Intramuscular Given 09/11/24 1401)    Initial Impression / Assessment and Plan / UC Course  I have reviewed the triage vital signs and the nursing notes.  Pertinent labs & imaging results that were available during my Robles of the patient were reviewed by me and considered in my medical decision making (see chart for details).    Administered Solumedrol 80mg  IM; begin prednisone  tomorrow. Begin Augmentin  875 BID for one week. Followup with Family Doctor if not improved in one week.   Final Clinical Impressions(s) / UC Diagnoses   Final diagnoses:  Acute recurrent maxillary sinusitis  Acute recurrent frontal sinusitis     Discharge Instructions      Begin prednisone  Saturday 09/12/24. Continue saline nasal irrigation. Try warm salt water gargles for sore throat.         ED Prescriptions     Medication Sig Dispense Auth. Provider   amoxicillin -clavulanate (AUGMENTIN ) 875-125 MG tablet Take one tab PO Q12hr with food 14 tablet Pauline Garnette LABOR, MD   predniSONE  (DELTASONE ) 20 MG tablet Take one tab by mouth twice daily for 4 days, then one daily for 3 days. Take with  food. 11 tablet Pauline Garnette LABOR, MD         Pauline Garnette LABOR, MD 09/12/24 [phone]

## 2024-09-13 ENCOUNTER — Other Ambulatory Visit: Payer: Self-pay | Admitting: Medical-Surgical

## 2024-09-13 DIAGNOSIS — J4520 Mild intermittent asthma, uncomplicated: Secondary | ICD-10-CM

## 2024-09-14 ENCOUNTER — Other Ambulatory Visit: Payer: Self-pay | Admitting: Medical-Surgical

## 2024-09-14 DIAGNOSIS — F322 Major depressive disorder, single episode, severe without psychotic features: Secondary | ICD-10-CM

## 2024-09-14 DIAGNOSIS — F411 Generalized anxiety disorder: Secondary | ICD-10-CM

## 2024-09-17 ENCOUNTER — Ambulatory Visit: Payer: Self-pay | Admitting: *Deleted

## 2024-09-17 NOTE — Telephone Encounter (Signed)
 FYI - Recommended UC /ED. No available appt with PCP or other providers.

## 2024-09-17 NOTE — Telephone Encounter (Signed)
 FYI Only or Action Required?: FYI only for provider.  Patient was last seen in primary care on 07/28/2024 by Curtis Debby PARAS, MD.  Called Nurse Triage reporting Facial Pain.  Symptoms began a week ago.  Interventions attempted: Rest, hydration, or home remedies.  Symptoms are: gradually worsening.  Triage Disposition: See HCP Within 4 Hours (Or PCP Triage)  Patient/caregiver understands and will follow disposition?: Yes           Copied from CRM #[id number]. Topic: Clinical - Red Word Triage >> Sep 17, 2024 10:25 AM Shanda MATSU wrote: Red Word that prompted transfer to Nurse Triage: Patient is calling in stating that she is has a sinus infection, however, the right side of her face hurts more than usual and she has a very painful headache. Reason for Disposition  [1] SEVERE pain (e.g., excruciating) AND [2] not improved after 2 hours of pain medicine  Answer Assessment - Initial Assessment Questions Recommended UC /ED . No available appt with PCP or other providers.      1. ONSET: When did the pain start? (e.g., minutes, hours, days)     Prior to 09/11/24 2. ONSET: Does the pain come and go, or has it been constant since it started? (e.g., constant, intermittent, fleeting)     Constant when awake  3. SEVERITY: How bad is the pain? (Scale 1-10; mild, moderate or severe)     severe 4. LOCATION: Where does it hurt?      Right side of face  5. RASH: Is there any redness, rash, or swelling of your face?     Puffy right side of face  6. FEVER: Do you have a fever? If Yes, ask: What is it, how was it measured, and when did it start?      unknown 7. OTHER SYMPTOMS: Do you have any other symptoms? (e.g., fever, toothache, nasal discharge, nasal congestion, clicking sensation in jaw joint)     Right side of face pain , headache. Denies blurred vision now no weakness either side of body. No chest pain or difficulty breathing now .  8. PREGNANCY: Is there any  chance you are pregnant? When was your last menstrual period?     na  Protocols used: Face Pain-A-AH

## 2024-09-18 ENCOUNTER — Ambulatory Visit
Admission: EM | Admit: 2024-09-18 | Discharge: 2024-09-18 | Disposition: A | Discharge status: Home / Self Care | Attending: Family Medicine | Admitting: Family Medicine

## 2024-09-18 DIAGNOSIS — R0989 Other specified symptoms and signs involving the circulatory and respiratory systems: Secondary | ICD-10-CM

## 2024-09-18 MED ORDER — PREDNISONE 10 MG (21) PO TBPK: ORAL_TABLET | Freq: Every day | ORAL | 0 refills | Status: DC

## 2024-09-18 NOTE — ED Triage Notes (Signed)
 Pt presents to uc with co recent sinus infection which she was treated with Augmentin  and finished yesterday however over the last few days she has started to have chest congestion and chest burning. Pt has been taking tyelnol otc

## 2024-09-18 NOTE — Discharge Instructions (Addendum)
 Instructed patient to complete Augmentin  as initially prescribed.  Advised patient take medications as directed with food to completion.  Encouraged to increase daily water intake to 64 ounces per day while taking this medication.  Advised if symptoms worsen and/or unresolved please follow-up with your PCP or here for further evaluation.

## 2024-09-18 NOTE — ED Provider Notes (Signed)
 [facility]    CSN: [id number] Arrival date & time: 09/18/24  1337      History   Chief Complaint Chief Complaint  Patient presents with   chest congestion    HPI Mary Robles is a 48 y.o. female.   HPI 48 year old female presents with chest congestion x 2-3 days.  Patient was evaluated here on 09/11/2024 for acute recurrent maxillary sinusitis please see epic for that encounter note.  PMH significant for fibromyalgia, obesity, and hypertension.  Past Medical History:  Diagnosis Date   Allergy    Angio-edema    Anxiety    Asthma    no symptoms in years, no inhaler   Depression    Fibromyalgia 2021   GERD (gastroesophageal reflux disease)    Herpes simplex    Hiatal hernia    Hyperlipidemia due to dietary fat intake    Hyperlysinemia    Hypertension    Hypokalemia    Lipoma of abdominal wall s/p excision 05/04/2014 05/19/2014   Sickle cell trait    Status post total abdominal hysterectomy 05/04/2014   Vitamin D  deficiency     Patient Active Problem List   Diagnosis Date Noted   Shellfish allergy 07/10/2024   Left lumbar radiculitis 06/16/2024   Prediabetes 01/10/2024   Radiculitis of right cervical region 02/19/2023   Plantar fasciitis, bilateral 04/05/2022   Fibromyalgia 02/16/2020   Gastroesophageal reflux disease    Chronic sinusitis 04/29/2019   Seasonal allergic rhinitis 03/14/2018   Obesity (BMI 30.0-34.9) 07/02/2017   Deviated septum 01/22/2017   Nasal turbinate hypertrophy 01/22/2017   MDD (major depressive disorder) 07/16/2016   Asthma, mild 04/11/2015   GAD (generalized anxiety disorder) 02/08/2015   Hyperlipidemia 07/09/2014   Insomnia 07/09/2014   Hypertension    Vitamin D  deficiency     Past Surgical History:  Procedure Laterality Date   51 HOUR PH STUDY N/A 07/08/2019   Procedure: 24 HOUR PH STUDY;  Surgeon: Eda Iha, MD;  Location: [facility];  Service: Gastroenterology;  Laterality: N/A;   ABDOMINAL  HYSTERECTOMY N/A 05/04/2014   Procedure: HYSTERECTOMY ABDOMINAL with BILATERAL SALPINGECTOMY ;  Surgeon: Bobie FORBES Crown de Charlynn FORBES Cary, MD;  Location: [facility];  Service: Gynecology;  Laterality: N/A;   BREAST BIOPSY Right 12/2014   benign fibroadenoma   ESOPHAGEAL MANOMETRY N/A 07/08/2019   Procedure: ESOPHAGEAL MANOMETRY (EM);  Surgeon: Eda Iha, MD;  Location: [facility];  Service: Gastroenterology;  Laterality: N/A;   FOOT SURGERY     LAPAROTOMY N/A 05/04/2014   Procedure: REMOVAL OF ABD WALL MASS ;  Surgeon: Elspeth KYM Schultze, MD;  Location: [facility];  Service: General;  Laterality: N/A;   LIPOMA EXCISION N/A 05/04/2014   Procedure: EXCISION LIPOMA of abdominal wall;  Surgeon: Bobie FORBES Crown de Charlynn FORBES Cary, MD;  Location: [facility];  Service: Gynecology;  Laterality: N/A;   NASAL SINUS SURGERY  2019   PELVIC LAPAROSCOPY Right 2018   oophorectomy   TUBAL LIGATION     UPPER GASTROINTESTINAL ENDOSCOPY      OB History     Gravida  4   Para  2   Term  2   Preterm      AB  2   Living  2      SAB  1   IAB  1   Ectopic      Multiple      Live Births  Home Medications    Prior to Admission medications   Medication Sig Start Date End Date Taking? Authorizing Provider  predniSONE  (STERAPRED UNI-PAK 21 TAB) 10 MG (21) TBPK tablet Take by mouth daily. Take 6 tabs by mouth daily  for 2 days, then 5 tabs for 2 days, then 4 tabs for 2 days, then 3 tabs for 2 days, 2 tabs for 2 days, then 1 tab by mouth daily for 2 days 09/18/24  Yes Teddy Sharper, FNP  albuterol  (VENTOLIN  HFA) 108 (90 Base) MCG/ACT inhaler Inhale 2 puffs into the lungs every 6 (six) hours as needed for wheezing or shortness of breath. 07/10/24   Willo Mini, NP  amoxicillin -clavulanate (AUGMENTIN ) 875-125 MG tablet Take one tab PO Q12hr with food 09/11/24   Pauline Garnette LABOR, MD  atorvastatin  (LIPITOR) 10 MG tablet Take 1 tablet (10 mg total) by mouth daily. 07/10/24   Willo Mini, NP  cetirizine  (ZYRTEC ) 10 MG tablet Take 1 tablet by mouth once daily 09/14/24   Jessup, Joy, NP  cloNIDine  (CATAPRES ) 0.1 MG tablet Take 1 tablet by mouth twice daily 08/24/24   Willo Mini, NP  DULoxetine  (CYMBALTA ) 30 MG capsule Take 1 capsule by mouth once daily 09/14/24   Jessup, Joy, NP  EPINEPHrine  (EPIPEN  2-PAK) 0.3 mg/0.3 mL IJ SOAJ injection Inject 0.3 mg into the muscle once as needed (for an anaphylactic reaction). 07/10/24   Willo Mini, NP  gabapentin  (NEURONTIN ) 300 MG capsule One tab PO qHS for a week, then BID for a week, then TID. May double weekly to a max of 3,600mg /day 07/28/24   Curtis Debby PARAS, MD  montelukast  (SINGULAIR ) 10 MG tablet TAKE 1 TABLET BY MOUTH AT BEDTIME 09/14/24   Willo Mini, NP  pantoprazole  (PROTONIX ) 20 MG tablet Take 1 tablet by mouth once daily 09/02/24   Jessup, Joy, NP  valsartan -hydrochlorothiazide  (DIOVAN -HCT) 320-25 MG tablet Take 1 tablet by mouth daily. 07/10/24   Willo Mini, NP    Family History Family History  Problem Relation Age of Onset   Diabetes Mother    Thyroid disease Mother    Colon polyps Mother    Stroke Father    Mental retardation Maternal Aunt    Diabetes Maternal Grandmother    Colon cancer Neg Hx    Esophageal cancer Neg Hx    Stomach cancer Neg Hx    Rectal cancer Neg Hx     Social History Social History   Tobacco Use   Smoking status: Never   Smokeless tobacco: Never  Vaping Use   Vaping status: Never Used  Substance Use Topics   Alcohol use: Not Currently    Alcohol/week: 0.0 standard drinks of alcohol   Drug use: Never     Allergies   Aldactone  [spironolactone ], Benazepril , Betadine [povidone iodine], Calcitonin (salmon), Other, Vicodin [hydrocodone -acetaminophen ], and Shellfish allergy   Review of Systems Review of Systems  Respiratory:         Chest congestion for 2 to 3 days  All other systems reviewed and are negative.    Physical Exam Triage Vital Signs ED Triage Vitals   Encounter Vitals Group     BP      Girls Systolic BP Percentile      Girls Diastolic BP Percentile      Boys Systolic BP Percentile      Boys Diastolic BP Percentile      Pulse      Resp      Temp      Temp  src      SpO2      Weight      Height      Head Circumference      Peak Flow      Pain Score      Pain Loc      Pain Education      Exclude from Growth Chart    No data found.  Updated Vital Signs BP (!) 155/112   Pulse 89   Temp 99 F (37.2 C)   Resp 19   LMP 04/17/2014 Comment: hysterectomy w/ single oophorectomy//a.c.  SpO2 98%    Physical Exam Vitals and nursing note reviewed.  Constitutional:      General: She is not in acute distress.    Appearance: Normal appearance. She is obese. She is not ill-appearing.  HENT:     Head: Normocephalic and atraumatic.     Right Ear: Tympanic membrane, ear canal and external ear normal.     Left Ear: Tympanic membrane, ear canal and external ear normal.     Mouth/Throat:     Mouth: Mucous membranes are moist.     Pharynx: Oropharynx is clear.  Eyes:     Extraocular Movements: Extraocular movements intact.     Conjunctiva/sclera: Conjunctivae normal.     Pupils: Pupils are equal, round, and reactive to light.  Cardiovascular:     Rate and Rhythm: Normal rate and regular rhythm.     Pulses: Normal pulses.     Heart sounds: Normal heart sounds.  Pulmonary:     Effort: Pulmonary effort is normal.     Breath sounds: Normal breath sounds. No wheezing, rhonchi or rales.     Comments: Infrequent nonproductive cough on exam Musculoskeletal:        General: Normal range of motion.  Skin:    General: Skin is warm and dry.  Neurological:     General: No focal deficit present.     Mental Status: She is alert and oriented to person, place, and time. Mental status is at baseline.  Psychiatric:        Mood and Affect: Mood normal.        Behavior: Behavior normal.      UC Treatments / Results  Labs (all labs ordered  are listed, but only abnormal results are displayed) Labs Reviewed - No data to display  EKG   Radiology No results found.  Procedures Procedures (including critical care time)  Medications Ordered in UC Medications - No data to display  Initial Impression / Assessment and Plan / UC Course  I have reviewed the triage vital signs and the nursing notes.  Pertinent labs & imaging results that were available during my care of the patient were reviewed by me and considered in my medical decision making (see chart for details).     MDM: 1.  Chest congestion-Rx'd Sterapred Unipak (42 tab 10 mg taper). Instructed patient to complete Augmentin  as initially prescribed.  Advised patient take medications as directed with food to completion.  Encouraged to increase daily water intake to 64 ounces per day while taking this medication.  Advised if symptoms worsen and/or unresolved please follow-up with your PCP or here for further evaluation.  Patient discharged home, hemodynamically stable.  Final Clinical Impressions(s) / UC Diagnoses   Final diagnoses:  Chest congestion     Discharge Instructions      Instructed patient to complete Augmentin  as initially prescribed.  Advised patient take medications as directed with food  to completion.  Encouraged to increase daily water intake to 64 ounces per day while taking this medication.  Advised if symptoms worsen and/or unresolved please follow-up with your PCP or here for further evaluation.     ED Prescriptions     Medication Sig Dispense Auth. Provider   predniSONE  (STERAPRED UNI-PAK 21 TAB) 10 MG (21) TBPK tablet Take by mouth daily. Take 6 tabs by mouth daily  for 2 days, then 5 tabs for 2 days, then 4 tabs for 2 days, then 3 tabs for 2 days, 2 tabs for 2 days, then 1 tab by mouth daily for 2 days 42 tablet Teddy Sharper, FNP      PDMP not reviewed this encounter.   Teddy Sharper, FNP 09/18/24 1439

## 2024-09-19 ENCOUNTER — Telehealth: Payer: Self-pay | Admitting: Emergency Medicine

## 2024-09-19 ENCOUNTER — Other Ambulatory Visit: Payer: Self-pay | Admitting: Medical-Surgical

## 2024-09-19 NOTE — Telephone Encounter (Signed)
 Spoke with patient states that she is doing better.  Will follow up as needed.

## 2024-09-28 ENCOUNTER — Encounter: Payer: Self-pay | Admitting: Obstetrics and Gynecology

## 2024-09-28 ENCOUNTER — Ambulatory Visit (INDEPENDENT_AMBULATORY_CARE_PROVIDER_SITE_OTHER): Payer: 59 | Admitting: Obstetrics and Gynecology

## 2024-09-28 VITALS — BP 122/82 | HR 79 | Ht 66.5 in | Wt 199.0 lb

## 2024-09-28 DIAGNOSIS — Z01419 Encounter for gynecological examination (general) (routine) without abnormal findings: Secondary | ICD-10-CM | POA: Diagnosis not present

## 2024-09-28 DIAGNOSIS — N951 Menopausal and female climacteric states: Secondary | ICD-10-CM | POA: Diagnosis not present

## 2024-09-28 DIAGNOSIS — Z1331 Encounter for screening for depression: Secondary | ICD-10-CM | POA: Diagnosis not present

## 2024-09-28 MED ORDER — VALACYCLOVIR HCL 500 MG PO TABS
500.0000 mg | ORAL_TABLET | Freq: Two times a day (BID) | ORAL | 1 refills | Status: AC
Start: 2024-09-28 — End: ?

## 2024-09-28 NOTE — Progress Notes (Signed)
 48 y.o. [id number] Divorced [place] American female here for annual exam. Would like to discuss blood work to see if she is in menopause.   Some hot flashes and sweating at night.  Feels bloated.  Declines tx.   Some shortness of breath recently. Treated for sinusitis.  Taking Prednisone  round number two.   Has asthma.    No partner change.   Declines STD screening.   PCP: Willo Mini, NP   Patient's last menstrual period was 04/17/2014.           Sexually active: Yes.    The current method of family planning is TAH/BSO.    Menopausal hormone therapy:  n/a Exercising: Yes.    Yard work and house work  Smoker:  no  OB History  Gravida Para Term Preterm AB Living  4 2 2  2 2   SAB IAB Ectopic Multiple Live Births  1 1       # Outcome Date GA Lbr Len/2nd Weight Sex Type Anes PTL Lv  4 IAB           3 Term           2 Term           1 SAB              HEALTH MAINTENANCE: Last 2 paps:  01/21/14 neg HR HPV neg History of abnormal Pap or positive HPV:  no Mammogram:   03/18/24 Breast Density Cat B, BIRADS Cat 1 neg  Colonoscopy:  07/14/21 - due in 2027.    Bone Density:  n/a  Result  n/a   Immunization History  Administered Date(s) Administered   Hepatitis B 11/13/2006, 12/20/2006, 07/03/2007   Influenza,inj,Quad PF,6+ Mos 01/03/2018   Influenza-Unspecified 09/17/2011, 09/18/2012, 09/23/2014   Pneumococcal Polysaccharide-23 04/11/2015   Td 12/18/2003, 09/03/2012   Tdap 09/03/2012, 08/16/2022      reports that she has never smoked. She has never used smokeless tobacco. She reports that she does not currently use alcohol. She reports that she does not use drugs.  Past Medical History:  Diagnosis Date   Allergy    Angio-edema    Anxiety    Asthma    no symptoms in years, no inhaler   Depression    Fibromyalgia 2021   GERD (gastroesophageal reflux disease)    Herpes simplex    Hiatal hernia    Hyperlipidemia due to dietary fat intake    Hyperlysinemia     Hypertension    Hypokalemia    Lipoma of abdominal wall s/p excision 05/04/2014 05/19/2014   Sickle cell trait    Status post total abdominal hysterectomy 05/04/2014   Vitamin D  deficiency     Past Surgical History:  Procedure Laterality Date   44 HOUR PH STUDY N/A 07/08/2019   Procedure: 24 HOUR PH STUDY;  Surgeon: Eda Iha, MD;  Location: [facility];  Service: Gastroenterology;  Laterality: N/A;   ABDOMINAL HYSTERECTOMY N/A 05/04/2014   Procedure: HYSTERECTOMY ABDOMINAL with BILATERAL SALPINGECTOMY ;  Surgeon: Bobie FORBES Crown de Charlynn FORBES Cary, MD;  Location: [facility];  Service: Gynecology;  Laterality: N/A;   BREAST BIOPSY Right 12/2014   benign fibroadenoma   ESOPHAGEAL MANOMETRY N/A 07/08/2019   Procedure: ESOPHAGEAL MANOMETRY (EM);  Surgeon: Eda Iha, MD;  Location: [facility];  Service: Gastroenterology;  Laterality: N/A;   FOOT SURGERY     LAPAROTOMY N/A 05/04/2014   Procedure: REMOVAL OF ABD WALL MASS ;  Surgeon: Elspeth KYM Schultze,  MD;  Location: [facility];  Service: General;  Laterality: N/A;   LIPOMA EXCISION N/A 05/04/2014   Procedure: EXCISION LIPOMA of abdominal wall;  Surgeon: Bobie FORBES Crown de Charlynn FORBES Cary, MD;  Location: [facility];  Service: Gynecology;  Laterality: N/A;   NASAL SINUS SURGERY  2019   PELVIC LAPAROSCOPY Right 2018   oophorectomy   TUBAL LIGATION     UPPER GASTROINTESTINAL ENDOSCOPY      Current Outpatient Medications  Medication Sig Dispense Refill   albuterol  (VENTOLIN  HFA) 108 (90 Base) MCG/ACT inhaler Inhale 2 puffs into the lungs every 6 (six) hours as needed for wheezing or shortness of breath. 8 g 0   atorvastatin  (LIPITOR) 10 MG tablet Take 1 tablet (10 mg total) by mouth daily. 90 tablet 0   cetirizine  (ZYRTEC ) 10 MG tablet Take 1 tablet by mouth once daily 30 tablet 0   cloNIDine  (CATAPRES ) 0.1 MG tablet Take 1 tablet by mouth twice daily 60 tablet 0   DULoxetine  (CYMBALTA ) 30 MG capsule Take 1 capsule by mouth once  daily 30 capsule 0   EPINEPHrine  (EPIPEN  2-PAK) 0.3 mg/0.3 mL IJ SOAJ injection Inject 0.3 mg into the muscle once as needed (for an anaphylactic reaction). 2 each 0   montelukast  (SINGULAIR ) 10 MG tablet TAKE 1 TABLET BY MOUTH AT BEDTIME 30 tablet 0   pantoprazole  (PROTONIX ) 20 MG tablet Take 1 tablet by mouth once daily 30 tablet 0   valsartan -hydrochlorothiazide  (DIOVAN -HCT) 320-25 MG tablet Take 1 tablet by mouth daily. 30 tablet 11   No current facility-administered medications for this visit.    ALLERGIES: Aldactone  [spironolactone ], Benazepril , Betadine [povidone iodine], Calcitonin (salmon), Other, Vicodin [hydrocodone -acetaminophen ], and Shellfish allergy  Family History  Problem Relation Age of Onset   Diabetes Mother    Thyroid disease Mother    Colon polyps Mother    Stroke Father    Mental retardation Maternal Aunt    Diabetes Maternal Grandmother    Colon cancer Neg Hx    Esophageal cancer Neg Hx    Stomach cancer Neg Hx    Rectal cancer Neg Hx     Review of Systems  All other systems reviewed and are negative.   PHYSICAL EXAM:  BP 122/82 (BP Location: Left Arm, Patient Position: Sitting)   Pulse 79   Ht 5' 6.5 (1.689 m)   Wt 199 lb (90.3 kg)   LMP 04/17/2014 Comment: hysterectomy w/ single oophorectomy//a.c.  SpO2 99%   BMI 31.64 kg/m     General appearance: alert, cooperative and appears stated age Head: normocephalic, without obvious abnormality, atraumatic Neck: no adenopathy, supple, symmetrical, trachea midline and thyroid normal to inspection and palpation Lungs: clear to auscultation bilaterally Breasts: normal appearance, no masses or tenderness, No nipple retraction or dimpling, No nipple discharge or bleeding, No axillary adenopathy Heart: regular rate and rhythm Abdomen: soft, non-tender; no masses, no organomegaly Extremities: extremities normal, atraumatic, no cyanosis or edema Skin: skin color, texture, turgor normal. No rashes or  lesions Lymph nodes: cervical, supraclavicular, and axillary nodes normal. Neurologic: grossly normal  Pelvic: External genitalia:  no lesions              No abnormal inguinal nodes palpated.              Urethra:  normal appearing urethra with no masses, tenderness or lesions              Bartholins and Skenes: normal  Vagina: normal appearing vagina with normal color and discharge, no lesions              Cervix: absent              Pap taken: no Bimanual Exam:  Uterus:  absent              Adnexa: no mass, fullness, tenderness              Rectal exam: yes.  Confirms.              Anus:  normal sphincter tone, no lesions  Chaperone was present for exam:  Kari HERO, CMA  ASSESSMENT: Well woman visit with gynecologic exam. Status post TAH for fibroids/bilateral salpingectomy/excision of abdominal wall lipoma. Status post laparoscopic right oophorectomy/excision of epiploic mass. Left ovary remains.  Menopausal symptoms.  Hx HSV.  Genital.  Fibromyalgia.  On Cymbalta . PHQ-2-9: 0  PLAN: Mammogram screening discussed. Self breast awareness reviewed. Pap and HRV collected:  no.  Not indicated.  Guidelines for Calcium , Vitamin D , regular exercise program including cardiovascular and weight bearing exercise. Medication refills:  Valtrex  500 mg po bid x 3 days prn.  #30, RF 1. FSH and estradiol. Follow up:  yearly and prn.

## 2024-09-28 NOTE — Patient Instructions (Signed)

## 2024-09-29 ENCOUNTER — Ambulatory Visit: Payer: Self-pay | Admitting: Obstetrics and Gynecology

## 2024-09-29 LAB — FOLLICLE STIMULATING HORMONE: FSH: 4.6 m[IU]/mL

## 2024-09-29 LAB — ESTRADIOL: Estradiol: 118 pg/mL

## 2024-10-03 ENCOUNTER — Other Ambulatory Visit: Payer: Self-pay | Admitting: Medical-Surgical

## 2024-10-11 ENCOUNTER — Other Ambulatory Visit: Payer: Self-pay | Admitting: Medical-Surgical

## 2024-10-11 DIAGNOSIS — J4520 Mild intermittent asthma, uncomplicated: Secondary | ICD-10-CM

## 2024-10-12 ENCOUNTER — Other Ambulatory Visit: Payer: Self-pay | Admitting: Medical-Surgical

## 2024-10-12 DIAGNOSIS — F322 Major depressive disorder, single episode, severe without psychotic features: Secondary | ICD-10-CM

## 2024-10-12 DIAGNOSIS — F411 Generalized anxiety disorder: Secondary | ICD-10-CM

## 2024-10-20 ENCOUNTER — Other Ambulatory Visit: Payer: Self-pay | Admitting: Medical-Surgical

## 2024-10-23 ENCOUNTER — Ambulatory Visit: Admitting: Sports Medicine

## 2024-10-23 ENCOUNTER — Ambulatory Visit: Payer: Self-pay

## 2024-10-23 ENCOUNTER — Encounter: Payer: Self-pay | Admitting: Sports Medicine

## 2024-10-23 VITALS — BP 137/87 | HR 103 | Temp 98.5°F | Wt 207.8 lb

## 2024-10-23 DIAGNOSIS — H5789 Other specified disorders of eye and adnexa: Secondary | ICD-10-CM

## 2024-10-23 DIAGNOSIS — I10 Essential (primary) hypertension: Secondary | ICD-10-CM

## 2024-10-23 DIAGNOSIS — J302 Other seasonal allergic rhinitis: Secondary | ICD-10-CM | POA: Diagnosis not present

## 2024-10-23 NOTE — Progress Notes (Signed)
 Careteam: Patient Care Team: Willo Mini, NP as PCP - General (Nurse Practitioner) Cathlyn JAYSON Nikki Bobie FORBES, MD as Consulting Physician (Obstetrics and Gynecology)  PLACE OF SERVICE:  [facility]  Advanced Directive information    Allergies  Allergen Reactions   Aldactone  [Spironolactone ] Swelling   Benazepril  Swelling    Lips and face became swollen   Betadine [Povidone Iodine] Other (See Comments)    Blisters    Calcitonin (Salmon) Itching   Other Hives, Itching and Other (See Comments)    Brazilian nuts  Brazilian nuts    Vicodin [Hydrocodone -Acetaminophen ] Other (See Comments)    Hallucinations    Shellfish Allergy Hives and Rash    Crab legs=Hives    No chief complaint on file.    Discussed the use of AI scribe software for clinical note transcription with the patient, who gave verbal consent to proceed.  History of Present Illness  Mary Robles is a 48 year old female who presents redness in her left  eye.  She noticed redness in her  left  eye last night after using the restroom. The eye feels irritated but there is no pain with eye movement. There is no history of trauma, though she may have rubbed her eyes due to tiredness. She experiences occasional blurry vision, even with glasses, but no double vision, significant pain, or changes in vision since yesterday.  She has a history of chronic sinus issues, including sinus drainage and mucus in her throat, which sometimes makes breathing difficult. She reports occasional ear pain, most recently on Wednesday. Her current medications include Flonase , Montelukast , and Zyrtec , which she takes at night.  She has a history of nasal surgery for a deviated septum.  She reports a home blood pressure reading of 144/92 last night.   No fever, nausea, vomiting, cough, chest congestion, or urinary symptoms. She reports occasional headaches, with the last one occurring on Wednesday. She experiences morning eye discharge,  described as 'eye boogies', but no significant eye discharge during the day.    Review of Systems:  Review of Systems  Constitutional:  Negative for chills and fever.  HENT:  Negative for congestion and sore throat.        Post nasal drip  Eyes:  Positive for redness. Negative for double vision, photophobia, pain and discharge.  Respiratory:  Negative for cough, sputum production and shortness of breath.   Cardiovascular:  Negative for chest pain, palpitations and leg swelling.  Gastrointestinal:  Negative for abdominal pain, heartburn and nausea.  Genitourinary:  Negative for dysuria, frequency and hematuria.  Musculoskeletal:  Negative for falls and myalgias.  Neurological:  Negative for dizziness, sensory change and focal weakness.   Negative unless indicated in HPI.   Past Medical History:  Diagnosis Date   Allergy    Angio-edema    Anxiety    Asthma    no symptoms in years, no inhaler   Depression    Fibromyalgia 2021   GERD (gastroesophageal reflux disease)    Herpes simplex    Hiatal hernia    Hyperlipidemia due to dietary fat intake    Hyperlysinemia    Hypertension    Hypokalemia    Lipoma of abdominal wall s/p excision 05/04/2014 05/19/2014   Sickle cell trait    Status post total abdominal hysterectomy 05/04/2014   Vitamin D  deficiency    Past Surgical History:  Procedure Laterality Date   24 HOUR PH STUDY N/A 07/08/2019   Procedure: 24 HOUR PH STUDY;  Surgeon: Eda Iha, MD;  Location: [facility];  Service: Gastroenterology;  Laterality: N/A;   ABDOMINAL HYSTERECTOMY N/A 05/04/2014   Procedure: HYSTERECTOMY ABDOMINAL with BILATERAL SALPINGECTOMY ;  Surgeon: Bobie FORBES Crown de Charlynn FORBES Cary, MD;  Location: [facility];  Service: Gynecology;  Laterality: N/A;   BREAST BIOPSY Right 12/2014   benign fibroadenoma   ESOPHAGEAL MANOMETRY N/A 07/08/2019   Procedure: ESOPHAGEAL MANOMETRY (EM);  Surgeon: Eda Iha, MD;  Location: [facility];   Service: Gastroenterology;  Laterality: N/A;   FOOT SURGERY     LAPAROTOMY N/A 05/04/2014   Procedure: REMOVAL OF ABD WALL MASS ;  Surgeon: Elspeth KYM Schultze, MD;  Location: [facility];  Service: General;  Laterality: N/A;   LIPOMA EXCISION N/A 05/04/2014   Procedure: EXCISION LIPOMA of abdominal wall;  Surgeon: Bobie FORBES Crown de Charlynn FORBES Cary, MD;  Location: [facility];  Service: Gynecology;  Laterality: N/A;   NASAL SINUS SURGERY  2019   PELVIC LAPAROSCOPY Right 2018   oophorectomy   TUBAL LIGATION     UPPER GASTROINTESTINAL ENDOSCOPY     Social History:   reports that she has never smoked. She has never used smokeless tobacco. She reports that she does not currently use alcohol. She reports that she does not use drugs.  Family History  Problem Relation Age of Onset   Diabetes Mother    Thyroid disease Mother    Colon polyps Mother    Stroke Father    Mental retardation Maternal Aunt    Diabetes Maternal Grandmother    Colon cancer Neg Hx    Esophageal cancer Neg Hx    Stomach cancer Neg Hx    Rectal cancer Neg Hx     Medications: Patient's Medications  New Prescriptions   No medications on file  Previous Medications   ALBUTEROL  (VENTOLIN  HFA) 108 (90 BASE) MCG/ACT INHALER    Inhale 2 puffs into the lungs every 6 (six) hours as needed for wheezing or shortness of breath.   ATORVASTATIN  (LIPITOR) 10 MG TABLET    Take 1 tablet (10 mg total) by mouth daily.   CETIRIZINE  (ZYRTEC ) 10 MG TABLET    Take 1 tablet (10 mg total) by mouth daily. NO REFILLS. NEEDS AN APPT. OVERDUE FOR A FOLLOW UP   CLONIDINE  (CATAPRES ) 0.1 MG TABLET    Take 1 tablet by mouth twice daily   DULOXETINE  (CYMBALTA ) 30 MG CAPSULE    Take 1 capsule by mouth once daily   EPINEPHRINE  (EPIPEN  2-PAK) 0.3 MG/0.3 ML IJ SOAJ INJECTION    Inject 0.3 mg into the muscle once as needed (for an anaphylactic reaction).   MONTELUKAST  (SINGULAIR ) 10 MG TABLET    Take 1 tablet (10 mg total) by mouth at bedtime. NO REFILLS. NEEDS  AN APPT. OVERDUE FOR A FOLLOW UP.   PANTOPRAZOLE  (PROTONIX ) 20 MG TABLET    Take 1 tablet by mouth once daily   VALACYCLOVIR  (VALTREX ) 500 MG TABLET    Take 1 tablet (500 mg total) by mouth 2 (two) times daily. Take twice daily for 3 days as needed for an outbreak.   VALSARTAN -HYDROCHLOROTHIAZIDE  (DIOVAN -HCT) 320-25 MG TABLET    Take 1 tablet by mouth daily.  Modified Medications   No medications on file  Discontinued Medications   No medications on file    Physical Exam: There were no vitals filed for this visit. There is no height or weight on file to calculate BMI. BP Readings from Last 3 Encounters:  09/28/24 122/82  09/18/24 (!) 155/112  09/11/24 (!) 144/98   Wt Readings from Last 3 Encounters:  09/28/24 199 lb (90.3 kg)  09/11/24 200 lb (90.7 kg)  07/10/24 201 lb (91.2 kg)    Physical Exam Constitutional:      Appearance: Normal appearance.  HENT:     Head: Normocephalic and atraumatic.     Mouth/Throat:     Comments: Left nostril- turbinate hypertrophy  Mild frontal sinus tenderness Eyes:     Pupils: Pupils are equal, round, and reactive to light.     Comments: Slight redness at the  medial side of  limbus in left eye  Cardiovascular:     Rate and Rhythm: Normal rate and regular rhythm.  Pulmonary:     Effort: Pulmonary effort is normal. No respiratory distress.     Breath sounds: Normal breath sounds. No wheezing.  Abdominal:     General: Bowel sounds are normal. There is no distension.     Tenderness: There is no abdominal tenderness. There is no guarding or rebound.     Comments:    Musculoskeletal:        General: No swelling or tenderness.  Neurological:     Mental Status: She is alert. Mental status is at baseline.     Sensory: No sensory deficit.     Motor: No weakness.     Labs reviewed: Basic Metabolic Panel: Recent Labs    07/10/24 1349  NA 139  K 3.7  CL 100  CO2 19*  GLUCOSE 80  BUN 11  CREATININE 0.94  CALCIUM  9.1  TSH 2.830    Liver Function Tests: Recent Labs    07/10/24 1349  AST 21  ALT 13  ALKPHOS 62  BILITOT 0.5  PROT 6.7  ALBUMIN 4.1   No results for input(s): LIPASE, AMYLASE in the last 8760 hours. No results for input(s): AMMONIA in the last 8760 hours. CBC: Recent Labs    07/10/24 1349  WBC 8.6  HGB 13.4  HCT 42.4  MCV 84  PLT 371   Lipid Panel: Recent Labs    07/10/24 1349  CHOL 133  HDL 41  LDLCALC 77  TRIG 73  CHOLHDL 3.2   TSH: Recent Labs    07/10/24 1349  TSH 2.830   A1C: Lab Results  Component Value Date   HGBA1C 5.7 (A) 07/10/2024    Assessment and Plan Assessment & Plan   1. Redness, eye (Primary)  No pain, vision loss, or double vision.   No red flag signs  Instructed patient to monitor for eye pain, vision changes     2. Seasonal allergies Cont with singulair , zyrtec    3. Primary hypertension Repeat bp improved Avoid salty foods Exercise regularly

## 2024-10-23 NOTE — Telephone Encounter (Signed)
 FYI Only or Action Required?: FYI only for provider: appointment scheduled on 10/23/24.  Patient was last seen in primary care on 07/28/2024 by Curtis Debby PARAS, MD.  Called Nurse Triage reporting No chief complaint on file..  Symptoms began last night.  Interventions attempted: Nothing.  Symptoms are: unchanged.  Triage Disposition: See Physician Within 24 Hours  Patient/caregiver understands and will follow disposition?: Yes Reason for Disposition  Eyelid is red and painful (or tender to touch)  Answer Assessment - Initial Assessment Questions Pt reports chronic sinusitis. Last night, she night she noticed a red area in right sclera. BP last night was 140/90. Believes r/t to sinusitis. Denies wearing contacts. Is tender to the touch.  1. LOCATION: Where is the redness? (e.g., eyeball or outer eyelids) Note: When callers say the eye is red, they usually mean the sclera (white of the eye) is red.       Right eye  2. ONE OR BOTH EYES: Is the redness in one or both eyes?      One  3. ONSET: When did the redness start? (e.g., hours, days)      Last night.  Protocols used: Eye - Redness-A-AH

## 2024-10-23 NOTE — Telephone Encounter (Signed)
 Patient has been scheduled in an alternative office today at 1:40 pm.

## 2024-11-03 ENCOUNTER — Other Ambulatory Visit: Payer: Self-pay | Admitting: Medical-Surgical

## 2024-11-03 DIAGNOSIS — E782 Mixed hyperlipidemia: Secondary | ICD-10-CM

## 2024-11-10 ENCOUNTER — Other Ambulatory Visit: Payer: Self-pay | Admitting: Medical-Surgical

## 2024-11-10 DIAGNOSIS — F411 Generalized anxiety disorder: Secondary | ICD-10-CM

## 2024-11-10 DIAGNOSIS — F322 Major depressive disorder, single episode, severe without psychotic features: Secondary | ICD-10-CM

## 2024-11-27 ENCOUNTER — Other Ambulatory Visit: Payer: Self-pay | Admitting: Medical-Surgical

## 2024-11-27 DIAGNOSIS — J4520 Mild intermittent asthma, uncomplicated: Secondary | ICD-10-CM

## 2024-12-11 ENCOUNTER — Other Ambulatory Visit: Payer: Self-pay | Admitting: Medical-Surgical

## 2024-12-11 DIAGNOSIS — F411 Generalized anxiety disorder: Secondary | ICD-10-CM

## 2024-12-11 DIAGNOSIS — F322 Major depressive disorder, single episode, severe without psychotic features: Secondary | ICD-10-CM

## 2024-12-19 ENCOUNTER — Other Ambulatory Visit: Payer: Self-pay | Admitting: Medical-Surgical

## 2024-12-19 DIAGNOSIS — J4520 Mild intermittent asthma, uncomplicated: Secondary | ICD-10-CM

## 2025-01-06 ENCOUNTER — Telehealth: Payer: Self-pay | Admitting: Medical-Surgical

## 2025-01-06 ENCOUNTER — Other Ambulatory Visit: Payer: Self-pay | Admitting: Medical-Surgical

## 2025-01-06 DIAGNOSIS — J4520 Mild intermittent asthma, uncomplicated: Secondary | ICD-10-CM

## 2025-01-06 MED ORDER — MONTELUKAST SODIUM 10 MG PO TABS: 10.0000 mg | ORAL_TABLET | Freq: Every day | ORAL | 0 refills | Status: DC

## 2025-01-06 NOTE — Telephone Encounter (Signed)
 Prescription Request  01/06/2025  LOV: 07/10/2024  What is the name of the medication or equipment? montelukast  (SINGULAIR ) 10 MG tablet   Have you contacted your pharmacy to request a refill? Yes   Which pharmacy would you like this sent to?  [id number] [address] [place], [place] - [address] [facility] [address] [place] [address] Phone: [phone] Fax: [phone]    Patient notified that their request is being sent to the clinical staff for review and that they should receive a response within 2 business days.   Please advise at [facility] [phone]

## 2025-01-09 ENCOUNTER — Other Ambulatory Visit: Payer: Self-pay | Admitting: Medical-Surgical

## 2025-01-09 DIAGNOSIS — F322 Major depressive disorder, single episode, severe without psychotic features: Secondary | ICD-10-CM

## 2025-01-09 DIAGNOSIS — F411 Generalized anxiety disorder: Secondary | ICD-10-CM

## 2025-01-11 ENCOUNTER — Ambulatory Visit: Admitting: Medical-Surgical

## 2025-01-12 NOTE — Progress Notes (Unsigned)
" ° °       Established patient visit   History of Present Illness   Discussed the use of AI scribe software for clinical note transcription with the patient, who gave verbal consent to proceed.  History of Present Illness   Mary Robles is a 49 year old female with chronic sinusitis who presents for a follow-up visit.  Chronic sinusitis and upper respiratory symptoms - Persistent unilateral facial blockage and puffiness - Blurry vision - Thick white mucus production - Intermittent episodes of eye redness, ear pain, and sore throat - Last course of antibiotics in October 2025 - Uses saline spray and cool mist humidifier for symptom relief - Avoids Flonase  due to dryness - Applies tea tree oil for ear discomfort  Respiratory symptoms - Exertional shortness of breath when climbing stairs, unchanged - Rare use of albuterol  inhaler - No chest pain, palpitations, or other cardiopulmonary symptoms  Sleep disturbance - Longstanding insomnia, worsened after recent stressful incident - Aggravated by midnight to 8 AM work schedule - Symptoms have improved somewhat  Mood symptoms - Depression and anxiety managed with Cymbalta       Physical Exam   Physical Exam Assessment & Plan     Assessment and Plan    Chronic sinusitis Previous antibiotics in September and October. Current management includes Nuvessa , saline spray, and cold mist humidifier. Flonase  not used due to irritation. - Prescribed Augmentin  for current exacerbation. - Continue Nuvessa , saline spray, and cold mist humidifier.  Primary hypertension Inconsistent home blood pressure monitoring due to device issues. - Advised to replace batteries in blood pressure monitor.  Mixed hyperlipidemia Managed with Lipitor. - Continue Lipitor as prescribed.  Asthma Shortness of breath on exertion. No recent albuterol  use. - Advised to use albuterol  inhaler before exertion.  Gastroesophageal reflux disease Managed  effectively with Protonix  20 mg daily. - Continue Protonix  20 mg daily.  Primary insomnia Intermittent insomnia, improving. Possibly related to work schedule and stress. - Continue current management and monitor symptoms.  Major depressive disorder, single episode, in partial remission Mood well-managed with Cymbalta . - Continue Cymbalta  as prescribed.  Generalized anxiety disorder Symptoms well-managed with current treatment. - Continue current management.  Loud snoring Reports loud snoring. - Provided referral for sleep study with [facility].        Follow up   Return in about 6 months (around 07/13/2025) for chronic disease follow up. __________________________________ Zada FREDRIK Palin, DNP, APRN, FNP-BC Primary Care and Sports Medicine [facility] [place] "

## 2025-01-13 ENCOUNTER — Encounter: Payer: Self-pay | Admitting: Medical-Surgical

## 2025-01-13 ENCOUNTER — Ambulatory Visit: Admitting: Medical-Surgical

## 2025-01-13 VITALS — BP 135/79 | HR 80 | Resp 20 | Ht 66.5 in | Wt 202.0 lb

## 2025-01-13 DIAGNOSIS — I10 Essential (primary) hypertension: Secondary | ICD-10-CM

## 2025-01-13 DIAGNOSIS — R0683 Snoring: Secondary | ICD-10-CM | POA: Diagnosis not present

## 2025-01-13 DIAGNOSIS — E782 Mixed hyperlipidemia: Secondary | ICD-10-CM | POA: Diagnosis not present

## 2025-01-13 DIAGNOSIS — F411 Generalized anxiety disorder: Secondary | ICD-10-CM | POA: Diagnosis not present

## 2025-01-13 DIAGNOSIS — F324 Major depressive disorder, single episode, in partial remission: Secondary | ICD-10-CM | POA: Diagnosis not present

## 2025-01-13 DIAGNOSIS — M48061 Spinal stenosis, lumbar region without neurogenic claudication: Secondary | ICD-10-CM

## 2025-01-13 DIAGNOSIS — J4520 Mild intermittent asthma, uncomplicated: Secondary | ICD-10-CM

## 2025-01-13 DIAGNOSIS — R7303 Prediabetes: Secondary | ICD-10-CM

## 2025-01-13 DIAGNOSIS — J329 Chronic sinusitis, unspecified: Secondary | ICD-10-CM

## 2025-01-13 DIAGNOSIS — K219 Gastro-esophageal reflux disease without esophagitis: Secondary | ICD-10-CM | POA: Diagnosis not present

## 2025-01-13 DIAGNOSIS — F5101 Primary insomnia: Secondary | ICD-10-CM

## 2025-01-13 DIAGNOSIS — F322 Major depressive disorder, single episode, severe without psychotic features: Secondary | ICD-10-CM

## 2025-01-13 LAB — POCT GLYCOSYLATED HEMOGLOBIN (HGB A1C): Hemoglobin A1C: 5.8 % — AB (ref 4.0–5.6)

## 2025-01-13 MED ORDER — AMOXICILLIN-POT CLAVULANATE 875-125 MG PO TABS: 1.0000 | ORAL_TABLET | Freq: Two times a day (BID) | ORAL | 0 refills | Status: AC

## 2025-01-13 MED ORDER — MONTELUKAST SODIUM 10 MG PO TABS: 10.0000 mg | ORAL_TABLET | Freq: Every day | ORAL | 3 refills | Status: AC

## 2025-01-13 MED ORDER — PANTOPRAZOLE SODIUM 20 MG PO TBEC: 20.0000 mg | DELAYED_RELEASE_TABLET | Freq: Every day | ORAL | 3 refills | Status: AC

## 2025-01-13 MED ORDER — DULOXETINE HCL 30 MG PO CPEP: 30.0000 mg | ORAL_CAPSULE | Freq: Every day | ORAL | 3 refills | Status: AC

## 2025-01-13 MED ORDER — CLONIDINE HCL 0.1 MG PO TABS: 0.1000 mg | ORAL_TABLET | Freq: Two times a day (BID) | ORAL | 3 refills | Status: AC

## 2025-01-13 MED ORDER — CETIRIZINE HCL 10 MG PO TABS: 10.0000 mg | ORAL_TABLET | Freq: Every day | ORAL | 3 refills | Status: AC

## 2025-01-13 MED ORDER — ATORVASTATIN CALCIUM 10 MG PO TABS: 10.0000 mg | ORAL_TABLET | Freq: Every day | ORAL | 3 refills | Status: AC

## 2025-07-13 ENCOUNTER — Ambulatory Visit: Admitting: Medical-Surgical

## 2025-09-29 ENCOUNTER — Ambulatory Visit: Admitting: Obstetrics and Gynecology
